# Patient Record
Sex: Female | Born: 1960 | Race: Black or African American | Hispanic: No | Marital: Single | Smoking: Never smoker
Health system: Southern US, Community
[De-identification: ages and names within clinical notes are randomized; demographics above are authoritative.]

## PROBLEM LIST (undated history)

## (undated) DIAGNOSIS — R51 Headache: Secondary | ICD-10-CM

## (undated) DIAGNOSIS — R519 Headache, unspecified: Secondary | ICD-10-CM

## (undated) DIAGNOSIS — I10 Essential (primary) hypertension: Secondary | ICD-10-CM

## (undated) DIAGNOSIS — M2550 Pain in unspecified joint: Secondary | ICD-10-CM

## (undated) DIAGNOSIS — G4730 Sleep apnea, unspecified: Secondary | ICD-10-CM

## (undated) DIAGNOSIS — D219 Benign neoplasm of connective and other soft tissue, unspecified: Secondary | ICD-10-CM

## (undated) DIAGNOSIS — F419 Anxiety disorder, unspecified: Secondary | ICD-10-CM

## (undated) DIAGNOSIS — E0789 Other specified disorders of thyroid: Secondary | ICD-10-CM

## (undated) DIAGNOSIS — M069 Rheumatoid arthritis, unspecified: Secondary | ICD-10-CM

## (undated) DIAGNOSIS — G8929 Other chronic pain: Secondary | ICD-10-CM

## (undated) DIAGNOSIS — R12 Heartburn: Secondary | ICD-10-CM

## (undated) DIAGNOSIS — M549 Dorsalgia, unspecified: Secondary | ICD-10-CM

## (undated) DIAGNOSIS — R2243 Localized swelling, mass and lump, lower limb, bilateral: Secondary | ICD-10-CM

## (undated) DIAGNOSIS — IMO0002 Reserved for concepts with insufficient information to code with codable children: Secondary | ICD-10-CM

## (undated) DIAGNOSIS — M5136 Other intervertebral disc degeneration, lumbar region: Secondary | ICD-10-CM

## (undated) DIAGNOSIS — R0602 Shortness of breath: Secondary | ICD-10-CM

## (undated) DIAGNOSIS — M5030 Other cervical disc degeneration, unspecified cervical region: Secondary | ICD-10-CM

## (undated) DIAGNOSIS — M1990 Unspecified osteoarthritis, unspecified site: Secondary | ICD-10-CM

## (undated) DIAGNOSIS — J45909 Unspecified asthma, uncomplicated: Secondary | ICD-10-CM

## (undated) DIAGNOSIS — K219 Gastro-esophageal reflux disease without esophagitis: Secondary | ICD-10-CM

## (undated) DIAGNOSIS — R7303 Prediabetes: Secondary | ICD-10-CM

## (undated) DIAGNOSIS — M51369 Other intervertebral disc degeneration, lumbar region without mention of lumbar back pain or lower extremity pain: Secondary | ICD-10-CM

## (undated) DIAGNOSIS — M797 Fibromyalgia: Secondary | ICD-10-CM

## (undated) HISTORY — DX: Shortness of breath: R06.02

## (undated) HISTORY — PX: TUBAL LIGATION: SHX77

## (undated) HISTORY — DX: Dorsalgia, unspecified: M54.9

## (undated) HISTORY — DX: Unspecified osteoarthritis, unspecified site: M19.90

## (undated) HISTORY — DX: Heartburn: R12

## (undated) HISTORY — DX: Benign neoplasm of connective and other soft tissue, unspecified: D21.9

## (undated) HISTORY — DX: Reserved for concepts with insufficient information to code with codable children: IMO0002

## (undated) HISTORY — PX: NOVASURE ABLATION: SHX5394

## (undated) HISTORY — DX: Localized swelling, mass and lump, lower limb, bilateral: R22.43

## (undated) HISTORY — DX: Rheumatoid arthritis, unspecified: M06.9

## (undated) HISTORY — DX: Other specified disorders of thyroid: E07.89

## (undated) HISTORY — DX: Other chronic pain: G89.29

## (undated) HISTORY — PX: BACK SURGERY: SHX140

## (undated) HISTORY — DX: Pain in unspecified joint: M25.50

---

## 1996-12-16 HISTORY — PX: ANTERIOR CERVICAL DECOMP/DISCECTOMY FUSION: SHX1161

## 2004-08-14 ENCOUNTER — Other Ambulatory Visit: Admission: RE | Admit: 2004-08-14 | Discharge: 2004-08-14 | Payer: Self-pay | Admitting: Family Medicine

## 2004-08-16 ENCOUNTER — Ambulatory Visit: Payer: Self-pay | Admitting: Nurse Practitioner

## 2004-10-10 ENCOUNTER — Ambulatory Visit: Payer: Self-pay | Admitting: Nurse Practitioner

## 2004-10-11 ENCOUNTER — Ambulatory Visit (HOSPITAL_COMMUNITY): Admission: RE | Admit: 2004-10-11 | Discharge: 2004-10-11 | Payer: Self-pay | Admitting: Internal Medicine

## 2004-10-16 ENCOUNTER — Ambulatory Visit: Payer: Self-pay | Admitting: Nurse Practitioner

## 2004-10-26 ENCOUNTER — Ambulatory Visit (HOSPITAL_COMMUNITY): Admission: RE | Admit: 2004-10-26 | Discharge: 2004-10-26 | Payer: Self-pay | Admitting: Family Medicine

## 2004-11-02 ENCOUNTER — Ambulatory Visit: Payer: Self-pay | Admitting: Internal Medicine

## 2004-12-03 ENCOUNTER — Ambulatory Visit: Payer: Self-pay | Admitting: Nurse Practitioner

## 2004-12-21 ENCOUNTER — Ambulatory Visit: Payer: Self-pay | Admitting: Nurse Practitioner

## 2004-12-21 ENCOUNTER — Ambulatory Visit: Payer: Self-pay | Admitting: Family Medicine

## 2005-01-14 ENCOUNTER — Ambulatory Visit (HOSPITAL_COMMUNITY): Admission: RE | Admit: 2005-01-14 | Discharge: 2005-01-14 | Payer: Self-pay | Admitting: Family Medicine

## 2005-01-14 ENCOUNTER — Ambulatory Visit: Payer: Self-pay | Admitting: Family Medicine

## 2005-01-14 ENCOUNTER — Encounter (INDEPENDENT_AMBULATORY_CARE_PROVIDER_SITE_OTHER): Payer: Self-pay | Admitting: *Deleted

## 2005-01-31 ENCOUNTER — Ambulatory Visit: Payer: Self-pay | Admitting: Family Medicine

## 2005-03-21 ENCOUNTER — Ambulatory Visit: Payer: Self-pay | Admitting: Family Medicine

## 2005-03-22 ENCOUNTER — Ambulatory Visit (HOSPITAL_COMMUNITY): Admission: RE | Admit: 2005-03-22 | Discharge: 2005-03-22 | Payer: Self-pay | Admitting: *Deleted

## 2005-05-02 ENCOUNTER — Ambulatory Visit: Payer: Self-pay | Admitting: Obstetrics and Gynecology

## 2005-05-20 ENCOUNTER — Ambulatory Visit (HOSPITAL_COMMUNITY): Admission: RE | Admit: 2005-05-20 | Discharge: 2005-05-20 | Payer: Self-pay | Admitting: Obstetrics and Gynecology

## 2005-05-20 ENCOUNTER — Ambulatory Visit: Payer: Self-pay | Admitting: Obstetrics and Gynecology

## 2005-05-20 ENCOUNTER — Encounter (INDEPENDENT_AMBULATORY_CARE_PROVIDER_SITE_OTHER): Payer: Self-pay | Admitting: *Deleted

## 2005-06-20 ENCOUNTER — Ambulatory Visit: Payer: Self-pay | Admitting: Nurse Practitioner

## 2005-07-30 ENCOUNTER — Ambulatory Visit: Payer: Self-pay | Admitting: Obstetrics and Gynecology

## 2005-09-24 ENCOUNTER — Encounter (INDEPENDENT_AMBULATORY_CARE_PROVIDER_SITE_OTHER): Payer: Self-pay | Admitting: *Deleted

## 2005-09-24 ENCOUNTER — Ambulatory Visit: Payer: Self-pay | Admitting: Obstetrics & Gynecology

## 2005-12-18 ENCOUNTER — Ambulatory Visit (HOSPITAL_COMMUNITY): Admission: RE | Admit: 2005-12-18 | Discharge: 2005-12-18 | Payer: Self-pay | Admitting: *Deleted

## 2006-06-17 ENCOUNTER — Ambulatory Visit: Payer: Self-pay | Admitting: Nurse Practitioner

## 2006-06-19 ENCOUNTER — Ambulatory Visit: Payer: Self-pay | Admitting: Nurse Practitioner

## 2006-11-24 ENCOUNTER — Ambulatory Visit: Payer: Self-pay | Admitting: Nurse Practitioner

## 2007-01-30 ENCOUNTER — Emergency Department (HOSPITAL_COMMUNITY): Admission: EM | Admit: 2007-01-30 | Discharge: 2007-01-30 | Payer: Self-pay | Admitting: Emergency Medicine

## 2007-03-05 ENCOUNTER — Ambulatory Visit: Payer: Self-pay | Admitting: Family Medicine

## 2007-04-03 ENCOUNTER — Ambulatory Visit: Payer: Self-pay | Admitting: Family Medicine

## 2007-05-02 ENCOUNTER — Emergency Department (HOSPITAL_COMMUNITY): Admission: EM | Admit: 2007-05-02 | Discharge: 2007-05-02 | Payer: Self-pay | Admitting: Family Medicine

## 2008-01-04 ENCOUNTER — Ambulatory Visit: Payer: Self-pay | Admitting: Family Medicine

## 2008-01-19 ENCOUNTER — Encounter (INDEPENDENT_AMBULATORY_CARE_PROVIDER_SITE_OTHER): Payer: Self-pay | Admitting: Internal Medicine

## 2008-01-19 ENCOUNTER — Ambulatory Visit: Payer: Self-pay | Admitting: Family Medicine

## 2008-01-19 LAB — CONVERTED CEMR LAB
ALT: 12 units/L (ref 0–35)
AST: 12 units/L (ref 0–37)
Albumin: 4 g/dL (ref 3.5–5.2)
Alkaline Phosphatase: 50 units/L (ref 39–117)
BUN: 14 mg/dL (ref 6–23)
Basophils Absolute: 0 10*3/uL (ref 0.0–0.1)
Basophils Relative: 1 % (ref 0–1)
CO2: 23 meq/L (ref 19–32)
Calcium: 9.1 mg/dL (ref 8.4–10.5)
Chloride: 108 meq/L (ref 96–112)
Cholesterol: 172 mg/dL (ref 0–200)
Creatinine, Ser: 0.84 mg/dL (ref 0.40–1.20)
Eosinophils Absolute: 0.1 10*3/uL (ref 0.0–0.7)
Eosinophils Relative: 1 % (ref 0–5)
Glucose, Bld: 93 mg/dL (ref 70–99)
HCT: 40.4 % (ref 36.0–46.0)
HDL: 57 mg/dL (ref >39)
Hemoglobin: 13.7 g/dL (ref 12.0–15.0)
LDL Cholesterol: 99 mg/dL (ref 0–99)
Lymphocytes Relative: 38 % (ref 12–46)
Lymphs Abs: 1.7 10*3/uL (ref 0.7–4.0)
MCHC: 33.9 g/dL (ref 30.0–36.0)
MCV: 92 fL (ref 78.0–100.0)
Monocytes Absolute: 0.3 10*3/uL (ref 0.1–1.0)
Monocytes Relative: 6 % (ref 3–12)
Neutro Abs: 2.4 10*3/uL (ref 1.7–7.7)
Neutrophils Relative %: 54 % (ref 43–77)
Platelets: 336 10*3/uL (ref 150–400)
Potassium: 4.4 meq/L (ref 3.5–5.3)
RBC: 4.39 M/uL (ref 3.87–5.11)
RDW: 12.9 % (ref 11.5–15.5)
Sodium: 141 meq/L (ref 135–145)
TSH: 0.755 microintl units/mL (ref 0.350–5.50)
Total Bilirubin: 0.5 mg/dL (ref 0.3–1.2)
Total CHOL/HDL Ratio: 3
Total Protein: 6.8 g/dL (ref 6.0–8.3)
Triglycerides: 80 mg/dL (ref <150)
VLDL: 16 mg/dL (ref 0–40)
WBC: 4.4 10*3/uL (ref 4.0–10.5)

## 2008-01-26 ENCOUNTER — Ambulatory Visit: Payer: Self-pay | Admitting: Family Medicine

## 2008-03-09 ENCOUNTER — Encounter (INDEPENDENT_AMBULATORY_CARE_PROVIDER_SITE_OTHER): Payer: Self-pay | Admitting: Nurse Practitioner

## 2008-03-09 ENCOUNTER — Ambulatory Visit: Payer: Self-pay | Admitting: Family Medicine

## 2008-03-09 LAB — CONVERTED CEMR LAB
ALT: 14 units/L (ref 0–35)
AST: 13 units/L (ref 0–37)
Albumin: 4.1 g/dL (ref 3.5–5.2)
Alkaline Phosphatase: 49 units/L (ref 39–117)
BUN: 11 mg/dL (ref 6–23)
CO2: 25 meq/L (ref 19–32)
Calcium: 9.1 mg/dL (ref 8.4–10.5)
Chloride: 103 meq/L (ref 96–112)
Creatinine, Ser: 0.8 mg/dL (ref 0.40–1.20)
Glucose, Bld: 91 mg/dL (ref 70–99)
Potassium: 4.3 meq/L (ref 3.5–5.3)
Sodium: 139 meq/L (ref 135–145)
Total Bilirubin: 0.4 mg/dL (ref 0.3–1.2)
Total Protein: 6.9 g/dL (ref 6.0–8.3)

## 2008-09-06 ENCOUNTER — Ambulatory Visit: Payer: Self-pay | Admitting: Internal Medicine

## 2008-10-27 ENCOUNTER — Ambulatory Visit: Payer: Self-pay | Admitting: Cardiovascular Disease

## 2008-10-27 ENCOUNTER — Observation Stay (HOSPITAL_COMMUNITY): Admission: EM | Admit: 2008-10-27 | Discharge: 2008-10-29 | Payer: Self-pay | Admitting: Family Medicine

## 2008-10-28 ENCOUNTER — Encounter (INDEPENDENT_AMBULATORY_CARE_PROVIDER_SITE_OTHER): Payer: Self-pay | Admitting: Internal Medicine

## 2009-01-27 ENCOUNTER — Ambulatory Visit: Payer: Self-pay | Admitting: Internal Medicine

## 2009-02-24 ENCOUNTER — Encounter (INDEPENDENT_AMBULATORY_CARE_PROVIDER_SITE_OTHER): Payer: Self-pay | Admitting: Internal Medicine

## 2009-02-24 ENCOUNTER — Ambulatory Visit: Payer: Self-pay | Admitting: Internal Medicine

## 2009-02-24 LAB — CONVERTED CEMR LAB
Chlamydia, DNA Probe: NEGATIVE
GC Probe Amp, Genital: NEGATIVE

## 2009-03-07 ENCOUNTER — Ambulatory Visit (HOSPITAL_COMMUNITY): Admission: RE | Admit: 2009-03-07 | Discharge: 2009-03-07 | Payer: Self-pay | Admitting: Internal Medicine

## 2009-03-07 ENCOUNTER — Encounter: Payer: Self-pay | Admitting: Internal Medicine

## 2009-03-24 ENCOUNTER — Emergency Department (HOSPITAL_COMMUNITY): Admission: EM | Admit: 2009-03-24 | Discharge: 2009-03-24 | Payer: Self-pay | Admitting: Emergency Medicine

## 2009-04-18 ENCOUNTER — Ambulatory Visit: Payer: Self-pay | Admitting: Family Medicine

## 2009-04-19 ENCOUNTER — Ambulatory Visit (HOSPITAL_COMMUNITY): Admission: RE | Admit: 2009-04-19 | Discharge: 2009-04-19 | Payer: Self-pay | Admitting: Internal Medicine

## 2009-07-16 ENCOUNTER — Emergency Department (HOSPITAL_COMMUNITY): Admission: EM | Admit: 2009-07-16 | Discharge: 2009-07-16 | Payer: Self-pay | Admitting: Family Medicine

## 2009-07-17 ENCOUNTER — Ambulatory Visit: Payer: Self-pay | Admitting: Internal Medicine

## 2009-09-14 ENCOUNTER — Telehealth (INDEPENDENT_AMBULATORY_CARE_PROVIDER_SITE_OTHER): Payer: Self-pay | Admitting: *Deleted

## 2009-11-23 ENCOUNTER — Ambulatory Visit: Payer: Self-pay | Admitting: Family Medicine

## 2009-12-14 ENCOUNTER — Telehealth (INDEPENDENT_AMBULATORY_CARE_PROVIDER_SITE_OTHER): Payer: Self-pay | Admitting: Internal Medicine

## 2009-12-18 ENCOUNTER — Ambulatory Visit: Payer: Self-pay | Admitting: Internal Medicine

## 2009-12-18 LAB — CONVERTED CEMR LAB
Amphetamine Screen, Ur: NEGATIVE
Barbiturate Quant, Ur: NEGATIVE
Benzodiazepines.: NEGATIVE
Cocaine Metabolites: NEGATIVE
Creatinine,U: 259.7 mg/dL
Marijuana Metabolite: NEGATIVE
Methadone: NEGATIVE
Opiate Screen, Urine: NEGATIVE
Phencyclidine (PCP): NEGATIVE
Propoxyphene: NEGATIVE

## 2010-01-25 ENCOUNTER — Ambulatory Visit: Payer: Self-pay | Admitting: Internal Medicine

## 2010-04-13 ENCOUNTER — Ambulatory Visit: Payer: Self-pay | Admitting: Internal Medicine

## 2010-04-13 LAB — CONVERTED CEMR LAB
BUN: 17 mg/dL (ref 6–23)
Basophils Absolute: 0 10*3/uL (ref 0.0–0.1)
Basophils Relative: 0 % (ref 0–1)
CO2: 23 meq/L (ref 19–32)
CRP: 0.9 mg/dL — ABNORMAL HIGH (ref <0.6)
Calcium: 8.8 mg/dL (ref 8.4–10.5)
Chloride: 103 meq/L (ref 96–112)
Creatinine, Ser: 0.8 mg/dL (ref 0.40–1.20)
Eosinophils Absolute: 0 10*3/uL (ref 0.0–0.7)
Eosinophils Relative: 1 % (ref 0–5)
Glucose, Bld: 88 mg/dL (ref 70–99)
HCT: 39.6 % (ref 36.0–46.0)
Hemoglobin: 13.1 g/dL (ref 12.0–15.0)
Iron: 93 ug/dL (ref 42–145)
Lymphocytes Relative: 30 % (ref 12–46)
Lymphs Abs: 1.7 10*3/uL (ref 0.7–4.0)
MCHC: 33.1 g/dL (ref 30.0–36.0)
MCV: 94.7 fL (ref 78.0–100.0)
Monocytes Absolute: 0.4 10*3/uL (ref 0.1–1.0)
Monocytes Relative: 6 % (ref 3–12)
Neutro Abs: 3.5 10*3/uL (ref 1.7–7.7)
Neutrophils Relative %: 63 % (ref 43–77)
Platelets: 337 10*3/uL (ref 150–400)
Potassium: 3.9 meq/L (ref 3.5–5.3)
RBC: 4.18 M/uL (ref 3.87–5.11)
RDW: 13.7 % (ref 11.5–15.5)
Rheumatoid fact SerPl-aCnc: <20 intl units/mL (ref 0–20)
Saturation Ratios: 26 % (ref 20–55)
Sodium: 136 meq/L (ref 135–145)
TIBC: 356 ug/dL (ref 250–470)
UIBC: 263 ug/dL
WBC: 5.6 10*3/uL (ref 4.0–10.5)

## 2010-04-20 ENCOUNTER — Ambulatory Visit: Payer: Self-pay | Admitting: Internal Medicine

## 2010-05-09 ENCOUNTER — Ambulatory Visit: Payer: Self-pay | Admitting: Internal Medicine

## 2010-05-09 LAB — CONVERTED CEMR LAB
Cholesterol: 181 mg/dL (ref 0–200)
HDL: 58 mg/dL (ref >39)
LDL Cholesterol: 92 mg/dL (ref 0–99)
TSH: 0.703 microintl units/mL (ref 0.350–4.500)
Total CHOL/HDL Ratio: 3.1
Triglycerides: 155 mg/dL — ABNORMAL HIGH (ref <150)
VLDL: 31 mg/dL (ref 0–40)

## 2010-05-17 ENCOUNTER — Ambulatory Visit: Payer: Self-pay | Admitting: Internal Medicine

## 2010-08-01 ENCOUNTER — Ambulatory Visit: Payer: Self-pay | Admitting: Internal Medicine

## 2010-08-21 ENCOUNTER — Ambulatory Visit: Payer: Self-pay | Admitting: Family Medicine

## 2010-12-12 ENCOUNTER — Ambulatory Visit (HOSPITAL_COMMUNITY): Admission: RE | Admit: 2010-12-12 | Payer: Self-pay | Source: Home / Self Care | Admitting: Internal Medicine

## 2010-12-20 ENCOUNTER — Emergency Department (HOSPITAL_COMMUNITY)
Admission: EM | Admit: 2010-12-20 | Discharge: 2010-12-20 | Payer: Self-pay | Source: Home / Self Care | Admitting: Emergency Medicine

## 2010-12-20 LAB — POCT I-STAT, CHEM 8
BUN: 21 mg/dL (ref 6–23)
Calcium, Ion: 1.13 mmol/L (ref 1.12–1.32)
Chloride: 106 mEq/L (ref 96–112)
Creatinine, Ser: 1 mg/dL (ref 0.4–1.2)
Glucose, Bld: 107 mg/dL — ABNORMAL HIGH (ref 70–99)
HCT: 40 % (ref 36.0–46.0)
Hemoglobin: 13.6 g/dL (ref 12.0–15.0)
Potassium: 3.8 mEq/L (ref 3.5–5.1)
Sodium: 138 mEq/L (ref 135–145)
TCO2: 25 mmol/L (ref 0–100)

## 2010-12-20 LAB — D-DIMER, QUANTITATIVE: D-Dimer, Quant: 0.32 ug/mL-FEU (ref 0.00–0.48)

## 2011-01-05 ENCOUNTER — Encounter: Payer: Self-pay | Admitting: *Deleted

## 2011-03-09 ENCOUNTER — Inpatient Hospital Stay (INDEPENDENT_AMBULATORY_CARE_PROVIDER_SITE_OTHER)
Admission: RE | Admit: 2011-03-09 | Discharge: 2011-03-09 | Disposition: A | Discharge status: Home / Self Care | Payer: Medicare Other | Source: Ambulatory Visit | Attending: Emergency Medicine | Admitting: Emergency Medicine

## 2011-03-09 ENCOUNTER — Emergency Department (HOSPITAL_COMMUNITY)
Admission: EM | Admit: 2011-03-09 | Discharge: 2011-03-09 | Disposition: A | Discharge status: Home / Self Care | Payer: Medicare Other | Attending: Emergency Medicine | Admitting: Emergency Medicine

## 2011-03-09 ENCOUNTER — Emergency Department (HOSPITAL_COMMUNITY): Payer: Medicare Other

## 2011-03-09 DIAGNOSIS — M1710 Unilateral primary osteoarthritis, unspecified knee: Secondary | ICD-10-CM | POA: Insufficient documentation

## 2011-03-09 DIAGNOSIS — M79609 Pain in unspecified limb: Secondary | ICD-10-CM

## 2011-03-09 DIAGNOSIS — IMO0002 Reserved for concepts with insufficient information to code with codable children: Secondary | ICD-10-CM | POA: Insufficient documentation

## 2011-03-09 DIAGNOSIS — M7989 Other specified soft tissue disorders: Secondary | ICD-10-CM

## 2011-03-09 DIAGNOSIS — M25569 Pain in unspecified knee: Secondary | ICD-10-CM | POA: Insufficient documentation

## 2011-03-09 LAB — DIFFERENTIAL
Basophils Absolute: 0 10*3/uL (ref 0.0–0.1)
Basophils Relative: 0 % (ref 0–1)
Eosinophils Absolute: 0 10*3/uL (ref 0.0–0.7)
Eosinophils Relative: 1 % (ref 0–5)
Monocytes Absolute: 0.4 10*3/uL (ref 0.1–1.0)

## 2011-03-09 LAB — APTT: aPTT: 30 seconds (ref 24–37)

## 2011-03-09 LAB — CBC
HCT: 38.8 % (ref 36.0–46.0)
MCHC: 35.3 g/dL (ref 30.0–36.0)
Platelets: 362 10*3/uL (ref 150–400)
RDW: 13.4 % (ref 11.5–15.5)
WBC: 6.4 10*3/uL (ref 4.0–10.5)

## 2011-03-09 LAB — BASIC METABOLIC PANEL
Calcium: 9.4 mg/dL (ref 8.4–10.5)
GFR calc Af Amer: >60 mL/min (ref >60)
GFR calc non Af Amer: >60 mL/min (ref >60)
Glucose, Bld: 88 mg/dL (ref 70–99)
Potassium: 3.4 mEq/L — ABNORMAL LOW (ref 3.5–5.1)
Sodium: 138 mEq/L (ref 135–145)

## 2011-03-09 LAB — PROTIME-INR: Prothrombin Time: 13.7 seconds (ref 11.6–15.2)

## 2011-03-12 ENCOUNTER — Other Ambulatory Visit: Payer: Self-pay | Admitting: Internal Medicine

## 2011-03-12 DIAGNOSIS — Z1231 Encounter for screening mammogram for malignant neoplasm of breast: Secondary | ICD-10-CM

## 2011-03-27 ENCOUNTER — Ambulatory Visit: Payer: Medicare Other

## 2011-04-24 ENCOUNTER — Other Ambulatory Visit (HOSPITAL_COMMUNITY): Payer: Self-pay | Admitting: Internal Medicine

## 2011-04-24 DIAGNOSIS — Z1231 Encounter for screening mammogram for malignant neoplasm of breast: Secondary | ICD-10-CM

## 2011-04-30 NOTE — H&P (Signed)
NAMESHERYN, ALDAZ NO.:  [id number]   MEDICAL RECORD NO.:  [id number]          PATIENT TYPE:  EMS   LOCATION:  [facility]                         FACILITY:  [facility]   PHYSICIAN:  Eduard Clos, MDDATE OF BIRTH:  [birth year]   DATE OF ADMISSION:  10/27/2008  DATE OF DISCHARGE:                              HISTORY & PHYSICAL   PRIMARY CARE PHYSICIAN:  [facility].   CHIEF COMPLAINT:  Chest pain.   HISTORY OF PRESENT ILLNESS:  A 50 year old female with a history of  hypertension presented to the ER complaining of chest pain.  The patient  has been having on and off neck pain for the last few days.  Today, she  developed some chest pain which was retrosternal radiating to her left  arm.  It lasted around 1/2-1 hour.  She was traveling, she drove the car  all the way to the ER.  Presently, the patient is chest pain free.  She  denies any associated shortness of breath, palpitations, diaphoresis,  nausea, vomiting, dizziness, loss of consciousness, dysuria, discharge  or abdominal pain.   PAST MEDICAL HISTORY:  Hypertension.   PAST SURGICAL HISTORY:  Neck surgery for disk collapse.   MEDICATIONS PRIOR TO ADMISSION:  Lisinopril/hydrochlorothiazide 20/12.5  mg p.o. daily.   ALLERGIES:  NO KNOWN DRUG ALLERGIES.   FAMILY HISTORY:  Nothing contributory.   SOCIAL HISTORY:  The patient lives with her son.  Denies smoking  cigarettes.  Drinks alcohol occasionally.  Denies any drug abuse.   REVIEW OF SYSTEMS:  As per history of present illness, nothing else  significant.   PHYSICAL EXAMINATION:  GENERAL:  Patient examined at bedside, not in  acute distress.  Denies any chest pain now.  VITAL SIGNS:  Blood pressure is 135/70, pulse 70 per minute, temperature  98.5, respirations 18 per minute.  O2 sat 100%.  HEENT:  Anicteric, no pallor.  CHEST:  Bilateral air entry present.  No rhonchi, no crepitation.  HEART:  S1-S2 heard.  ABDOMEN:  Soft, nontender.  Bowel  sounds heard.  No guarding, no  rigidity.  CNS:  Alert, awake, oriented to time, place and person.  Moves upper and  lower extremities 5/5.  EXTREMITIES:  Peripheral pulses felt.  No edema.   LABORATORY DATA:  EKG:  Normal sinus rhythm with nonspecific T-wave  changes in the lateral leads and inferior leads.  Chest x-ray:  Nothing  acute.  CBC - WBC 5.4, hemoglobin 13.6, hematocrit 40, platelets 331,  neutrophils 58%.  Basic metabolic panel:  Sodium 139, potassium 4,  chloride 104, glucose 98, BUN 13, creatinine 0.9, troponin-I less than  0.05.   ASSESSMENT:  1. Chest pain to rule out acute coronary syndrome.  2. History of hypertension.   PLAN:  Admit patient to telemetry.  Will cycle cardiac markers.  Will  resume her home medication.  Will get a 2-D echo and further  recommendations as the patient's condition evolves.      Eduard Clos, MD  Electronically Signed     ANK/MEDQ  D:  10/27/2008  T:  10/27/2008  Job:  [id number] 

## 2011-05-03 ENCOUNTER — Emergency Department (HOSPITAL_COMMUNITY)
Admission: EM | Admit: 2011-05-03 | Discharge: 2011-05-03 | Disposition: A | Discharge status: Home / Self Care | Payer: Medicare Other | Attending: Emergency Medicine | Admitting: Emergency Medicine

## 2011-05-03 ENCOUNTER — Emergency Department (HOSPITAL_COMMUNITY): Payer: Medicare Other

## 2011-05-03 ENCOUNTER — Inpatient Hospital Stay (INDEPENDENT_AMBULATORY_CARE_PROVIDER_SITE_OTHER)
Admission: RE | Admit: 2011-05-03 | Discharge: 2011-05-03 | Disposition: A | Discharge status: Home / Self Care | Payer: Medicare Other | Source: Ambulatory Visit | Attending: Emergency Medicine | Admitting: Emergency Medicine

## 2011-05-03 DIAGNOSIS — R0789 Other chest pain: Secondary | ICD-10-CM | POA: Insufficient documentation

## 2011-05-03 DIAGNOSIS — R079 Chest pain, unspecified: Secondary | ICD-10-CM

## 2011-05-03 DIAGNOSIS — I10 Essential (primary) hypertension: Secondary | ICD-10-CM | POA: Insufficient documentation

## 2011-05-03 DIAGNOSIS — M25519 Pain in unspecified shoulder: Secondary | ICD-10-CM | POA: Insufficient documentation

## 2011-05-03 DIAGNOSIS — I517 Cardiomegaly: Secondary | ICD-10-CM | POA: Insufficient documentation

## 2011-05-03 DIAGNOSIS — R0602 Shortness of breath: Secondary | ICD-10-CM | POA: Insufficient documentation

## 2011-05-03 LAB — POCT CARDIAC MARKERS: Troponin i, poc: <0.05 ng/mL (ref 0.00–0.09)

## 2011-05-03 NOTE — Group Therapy Note (Signed)
NAME:  [name], SCHNACKENBERG NO.:  [id number]   MEDICAL RECORD NO.:  [id number]          PATIENT TYPE:  WOC   LOCATION:  [facility]                   FACILITY:  [facility]   PHYSICIAN:  Argentina Donovan, MD        DATE OF BIRTH:  08/17/1961   DATE OF SERVICE:  05/02/2005                                    CLINIC NOTE   REASON FOR VISIT:  The patient is a 50 year old gravida 2 para 2-0-0-2 with  two vaginal deliveries who underwent D&C in January 2006 for constant  abdominal cramping and menometrorrhagia. Endometrial hyperplastic polyp was  obtained. There was simple hyperplasia without atypia. The patient continued  to have daily cramping and spotting and was placed on oral contraceptives  which controlled the bleeding to a great degree with the exception of the  spotting every day and the cramping. We have talked in detail with the  patient that she probably has adenomyosis by history and by ultrasound, and  a mildly enlarged uterus. We have told her that we may be able to control  the bleeding with an endometrial ablation but the cramping will continue,  and the amount of cramping and how it affects her lifestyle is going to make  her decision on whether she would rather have an ablation alone or undergo  hysterectomy. At the present time, she said that she would like to try the  ablation and see if once the bleeding is undergo - since it drives her crazy  spotting every day - if that works then she will probably be able to put up  with the bit of cramping that she has.   IMPRESSION:  Metrorrhagia with low abdominal cramping.      PR/MEDQ  D:  05/02/2005  T:  05/02/2005  Job:  [id number]

## 2011-05-03 NOTE — Group Therapy Note (Signed)
NAME:  [name], [name] NO.:  [id number]   MEDICAL RECORD NO.:  [id number]          PATIENT TYPE:  WOC   LOCATION:  [facility]                   FACILITY:  [facility]   PHYSICIAN:  Tinnie Gens, MD        DATE OF BIRTH:  05/23/1961   DATE OF SERVICE:  04/03/2007                                  CLINIC NOTE   CHIEF COMPLAINT:  Pelvic pain.   HISTORY OF PRESENT ILLNESS:  The patient is a 50 year old gravida 2,  para 2 has a history of abdominal pain that is sporadic in nature, is  usually relieved with pain medication.  The patient was previously seen  in this office in March of 2008 and discussed diagnostic laparoscopy  versus hysterectomy.  And the patient was calling back in to say she  wanted a hysterectomy.  The patient has previously undergone endometrial  ablation and has very light cycles.  She has had two this month;  however, they are so light that they do not really bother her.  The  patient is very unclear as to what her diagnosis is.  By ultrasound it  appears that the patient has a history of adenomyosis.  The pain as  previously reported is sporadic and comes and goes.  After a careful  explanation of what is causing her pain and when it is likely to go away  postmenopausal, as well as risks and benefits of surgery, the patient  has opted not for hysterectomy but watchful waiting.  She would like  Percocet just as needed for pain control and a script was written for 30  Percocet today.  The patient will follow up as needed probably in three  to four weeks.  She does need a Pap smear but she is on her cycle today  and we will get that done for her.           ______________________________  Tinnie Gens, MD     TP/MEDQ  D:  04/03/2007  T:  04/03/2007  Job:  [id number]

## 2011-05-03 NOTE — Group Therapy Note (Signed)
NAME:  [name], [name] NO.:  [id number]   MEDICAL RECORD NO.:  [id number]          PATIENT TYPE:  WOC   LOCATION:  [facility]                   FACILITY:  [facility]   PHYSICIAN:  Ginger Carne, MD DATE OF BIRTH:  1961-04-15   DATE OF SERVICE:  03/05/2007                                  CLINIC NOTE   This patient returns today because of continued lower abdominal  discomfort with dysmenorrhea.  She has been seen here quite a few times  with a number of did not show appointments.  She had an ultrasound in  January of 2007 which reconfirmed adenomyosis for which she has had  prior ultrasonography in April of 2006.  She has pain between her menses  and with her periods as well.  Menses about every 30-40 days apart.  They are light lasting 3-4 days.  She has no genitourinary or  gastrointestinal symptomatology consistent with urological or bowel  diseases.   SALIENT PHYSICAL FINDINGS:  External genitalia, vulva, and vagina  normal.  Cervix smooth without erosions or lesions.  Uterus small,  anteverted and flexed.  Both adnexa palpable and found to be normal.  The patient does have central obesity.  The patient weighs 268 pounds.   IMPRESSION AND PLAN:  I discussed with the patient that a diagnostic  laparoscopy would be her best option to delineate sources for her pain  including endometriosis.  It is more than likely that she has a  component of adenomyosis based on her symptoms and ultrasonography as  well.  She seems reluctant to proceed at this point.  I explained to her  that I am more than happy to order another ultrasound.  However, I do  not think it will change her management and not tell as anything that we  do not already know.  She will think about this and return on a p.r.n.  basis.           ______________________________  Ginger Carne, MD     SHB/MEDQ  D:  03/05/2007  T:  03/05/2007  Job:  [id number]

## 2011-05-03 NOTE — Group Therapy Note (Signed)
NAMEGIANINA, [name] NO.:  [id number]   MEDICAL RECORD NO.:  [id number]          PATIENT TYPE:  WOC   LOCATION:  [facility]                   FACILITY:  [facility]   PHYSICIAN:  Elsie Lincoln, MD      DATE OF BIRTH:  [birth year]   DATE OF SERVICE:  09/24/2005                                    CLINIC NOTE   REASON FOR VISIT:  The patient is a 50 year old female who presents for her  annual exam. The patient underwent D&C hysteroscopy and NovaSure ablation in  June 2006. Since then her periods are much better. They last 2-3 days and  are light. She is satisfied with her procedure. However, ever since the  procedure she has had some left lower quadrant abdominal pain. Her  ultrasound prior to this in April 2006 showed a normal ovary; however, she  could have a mild hematometra or something causing this pain, so we will  proceed with another transvaginal ultrasound. The patient states she is  otherwise fine. She is sexually active and has had a BTL in the past, so  pregnancy is not a concern. She still needs to be protecting herself from  STDs.   PAST MEDICAL HISTORY:  No change. She still has hypertension which is  borderline today at 144/91.   PAST SURGICAL HISTORY:  No change.   GYNECOLOGICAL HISTORY:  As above.   ALLERGIES:  None.   MEDICATIONS:  Lisinopril 20 mg p.o. daily.   REVIEW OF SYMPTOMS:  The patient has normal bowel movements every day, no  constipation. The pain does not increase with bowel movements or urination.  The patient states the pain is constant, is a pulling in the left lower  quadrant. It is worse at night and she cannot sleep on her left side.   PHYSICAL EXAMINATION:  VITAL SIGNS:  Temperature 99, pulse 75, blood  pressure 144/91, weight 270.2, height 5 feet 8 inches.  GENERAL:  Well-nourished, well-developed, no apparent distress.  HEENT:  Normocephalic, atraumatic.  BREASTS:  No masses, nontender, no lymphadenopathy.  ABDOMEN:   Soft, nontender, nondistended. No rebound, no guarding. No  tenderness to deep palpation in the left lower quadrant. Inguinal area:  No  lymphadenopathy.  PELVIC:  Genitalia Tanner V. Vagina pink, normal rugae. Cervix large,  nontender. Uterus:  Unable to palpated secondary to body habitus. Adnexa:  No masses, nontender to deep palpation.  RECTAL:  No masses.  EXTREMITIES:  Trace edema, nontender.   ASSESSMENT AND PLAN:  A 50 year old female for Pap smear, cultures, and  evaluation of left lower quadrant pain.   1.  Pap smear and cultures done.  2.  Transvaginal ultrasound to be ordered.  3.  Mammogram due November 2006; last mammogram was BIRADS 1 a year ago.   The patient is to come back in 6 weeks for test results and to discuss if  anything can be done for the pain. If there is nothing on ultrasound I would  suggest going back to her primary care physician to evaluate musculoskeletal  versus bowel problems.  ______________________________  Elsie Lincoln, MD     KL/MEDQ  D:  09/24/2005  T:  09/25/2005  Job:  [id number]

## 2011-05-03 NOTE — Op Note (Signed)
[name], [name]               ACCOUNT NO.:  [id number]   MEDICAL RECORD NO.:  [id number]          PATIENT TYPE:  AMB   LOCATION:  [facility]                           [facility]   PHYSICIAN:  Phil D. Okey Dupre, M.D.     DATE OF BIRTH:  1961-10-27   DATE OF PROCEDURE:  05/20/2005  DATE OF DISCHARGE:                                 OPERATIVE REPORT   PROCEDURES:  Dilatation and curettage and NovaSure endometrial ablation.   PREOPERATIVE DIAGNOSIS:  Intractable menometrorrhagia.   POSTOPERATIVE DIAGNOSIS:  Intractable menometrorrhagia.   PROCEDURE:  Under satisfactory MAC analgesia with the patient in the dorsal  lithotomy position, the perineum was prepped and draped in the usual sterile  manner.  Bimanual pelvic examination failed to be able to outline the uterus  because of the habitus of the patient.  A weighted speculum was placed in  the posterior fourchette of the vagina, the anterior lip of the cervix  grasped with a single-tooth tenaculum.  Xylocaine 1% 10 mL was injected into  each of the lateral paracervical areas at 4 and 8 o'clock for additional  analgesia.  The uterine cavity was then sounded to a depth of 10.5 cm.  The  internal cervical os measured 4.5 cm.  The cervical os was dilated to a #8  Hegar dilator.  The uterine cavity was explored with the polyp forceps,  followed by curettage with a small serrated curette.  Tissue was sent for  pathologic diagnosis.  The NovaSure instrument was placed into the uterus up  to the fundus after being set at 6 cm.  The usual motions of seating were  carried out with a width of 4.5 cm.  Power was 149 for one minute.  The  procedure was terminated by removing the instrument in the usual fashion.  The tenaculum and speculum were then removed from the vagina and the patient  was transferred to the recovery room in satisfactory condition, having  tolerated the procedure well.      PDR/MEDQ  D:  05/20/2005  T:  05/20/2005  Job:   [id number]

## 2011-05-03 NOTE — Op Note (Signed)
[name], FEILD               ACCOUNT NO.:  [id number]   MEDICAL RECORD NO.:  [id number]          PATIENT TYPE:  AMB   LOCATION:  [facility]                           [facility]   PHYSICIAN:  Tanya S. Shawnie Pons, M.D.   DATE OF BIRTH:  1961-09-17   DATE OF PROCEDURE:  01/14/2005  DATE OF DISCHARGE:                                 OPERATIVE REPORT   PREOPERATIVE DIAGNOSES:  1.  Dysfunctional uterine bleeding.  2.  Endometrial polyp.   POSTOPERATIVE DIAGNOSES:  1.  Dysfunctional uterine bleeding.  2.  Endometrial polyp.   PROCEDURE:  Dilatation and curettage, hysteroscopy.   SURGEON:  Shelbie Proctor. Shawnie Pons, M.D.   ANESTHESIA:  MAC and local.   SPECIMENS:  Endometrial curettings.   ESTIMATED BLOOD LOSS:  Minimal.   COMPLICATIONS:  None.   REASON FOR PROCEDURE:  The patient is a 50 year old gravida 2, para 2, who  had two endometrial polyps diagnosed on sonohysterogram, who has been on  Depo and has daily spotting and would like definitive treatment.   PROCEDURE:  The patient was taken to the OR, where she was placed in  lithotomy in [place] stirrups.  She was then prepped and draped in the usual  sterile fashion.  A speculum was used to visualize the cervix, which was  grasped anteriorly with a single-tooth tenaculum.  A cervical block was done  with 0.25% Marcaine.  The cervix was sequentially dilated.  The hysteroscope  was placed inside the uterine cavity.  There was a polyp anterior and to the  right, posterior and to the left, noted.  Sharp curettage was then used to  rid the endometrial cavity of these.  All instruments were then removed from  the vagina, all instrument and lap counts were correct 2.  The patient was  awakened and taken to the recovery room in stable condition.      TSP/MEDQ  D:  01/14/2005  T:  01/14/2005  Job:  [id number]

## 2011-05-06 ENCOUNTER — Ambulatory Visit (HOSPITAL_COMMUNITY): Payer: Medicare Other

## 2011-07-06 ENCOUNTER — Inpatient Hospital Stay (INDEPENDENT_AMBULATORY_CARE_PROVIDER_SITE_OTHER)
Admission: RE | Admit: 2011-07-06 | Discharge: 2011-07-06 | Disposition: A | Discharge status: Home / Self Care | Payer: Medicare Other | Source: Ambulatory Visit | Attending: Family Medicine | Admitting: Family Medicine

## 2011-07-06 DIAGNOSIS — R6889 Other general symptoms and signs: Secondary | ICD-10-CM

## 2011-09-17 LAB — DIFFERENTIAL
Basophils Absolute: 0
Eosinophils Absolute: 0
Eosinophils Relative: 0
Lymphocytes Relative: 33
Lymphs Abs: 1.8
Monocytes Absolute: 0.4

## 2011-09-17 LAB — POCT I-STAT, CHEM 8
BUN: 13
Calcium, Ion: 1.21
Creatinine, Ser: 0.9
Hemoglobin: 13.6
TCO2: 27

## 2011-09-17 LAB — CBC
HCT: 39
HCT: 39.2
Hemoglobin: 13.3
MCHC: 34.3
MCV: 93.7
MCV: 94.3
Platelets: 315
RDW: 13
WBC: 5.1

## 2011-09-17 LAB — LIPID PANEL
Total CHOL/HDL Ratio: 3
VLDL: 19

## 2011-09-17 LAB — POCT CARDIAC MARKERS
CKMB, poc: <1 — ABNORMAL LOW
Troponin i, poc: <0.05

## 2011-09-17 LAB — CARDIAC PANEL(CRET KIN+CKTOT+MB+TROPI)
Relative Index: INVALID
Relative Index: INVALID
Relative Index: INVALID
Total CK: 60
Total CK: 70
Total CK: 71
Total CK: 84
Troponin I: <0.01

## 2011-09-17 LAB — BASIC METABOLIC PANEL
CO2: 27
Calcium: 9
Chloride: 106
GFR calc Af Amer: >60
Potassium: 3.6
Sodium: 138

## 2011-09-30 ENCOUNTER — Ambulatory Visit: Payer: Medicare Other | Attending: Anesthesiology | Admitting: Physical Therapy

## 2012-01-16 ENCOUNTER — Ambulatory Visit: Payer: Medicare Other | Attending: Internal Medicine | Admitting: Physical Therapy

## 2012-01-16 DIAGNOSIS — M542 Cervicalgia: Secondary | ICD-10-CM | POA: Insufficient documentation

## 2012-01-16 DIAGNOSIS — IMO0001 Reserved for inherently not codable concepts without codable children: Secondary | ICD-10-CM | POA: Insufficient documentation

## 2012-01-21 ENCOUNTER — Ambulatory Visit: Payer: Medicare Other | Admitting: Physical Therapy

## 2012-01-23 ENCOUNTER — Ambulatory Visit: Payer: Medicare Other | Attending: Internal Medicine | Admitting: Physical Therapy

## 2012-01-23 DIAGNOSIS — M542 Cervicalgia: Secondary | ICD-10-CM | POA: Insufficient documentation

## 2012-01-23 DIAGNOSIS — IMO0001 Reserved for inherently not codable concepts without codable children: Secondary | ICD-10-CM | POA: Insufficient documentation

## 2012-01-30 ENCOUNTER — Ambulatory Visit: Payer: Medicare Other | Admitting: Physical Therapy

## 2012-02-04 ENCOUNTER — Ambulatory Visit: Payer: Medicare Other | Admitting: Physical Therapy

## 2012-02-06 ENCOUNTER — Ambulatory Visit: Payer: Medicare Other | Admitting: Physical Therapy

## 2012-02-11 ENCOUNTER — Ambulatory Visit: Payer: Medicare Other | Admitting: Physical Therapy

## 2012-02-13 ENCOUNTER — Ambulatory Visit: Payer: Medicare Other | Admitting: Physical Therapy

## 2012-03-06 ENCOUNTER — Other Ambulatory Visit: Payer: Self-pay | Admitting: Internal Medicine

## 2012-03-06 DIAGNOSIS — Z1231 Encounter for screening mammogram for malignant neoplasm of breast: Secondary | ICD-10-CM

## 2012-03-09 ENCOUNTER — Ambulatory Visit: Payer: Medicare Other

## 2012-03-16 ENCOUNTER — Ambulatory Visit: Payer: Medicare Other

## 2012-11-14 ENCOUNTER — Emergency Department (HOSPITAL_COMMUNITY)
Admission: EM | Admit: 2012-11-14 | Discharge: 2012-11-14 | Disposition: A | Discharge status: Home / Self Care | Payer: Medicare Other | Attending: Emergency Medicine | Admitting: Emergency Medicine

## 2012-11-14 ENCOUNTER — Encounter (HOSPITAL_COMMUNITY): Payer: Self-pay | Admitting: Emergency Medicine

## 2012-11-14 ENCOUNTER — Emergency Department (HOSPITAL_COMMUNITY): Payer: Medicare Other

## 2012-11-14 DIAGNOSIS — R059 Cough, unspecified: Secondary | ICD-10-CM | POA: Insufficient documentation

## 2012-11-14 DIAGNOSIS — R0789 Other chest pain: Secondary | ICD-10-CM | POA: Insufficient documentation

## 2012-11-14 DIAGNOSIS — R05 Cough: Secondary | ICD-10-CM | POA: Insufficient documentation

## 2012-11-14 DIAGNOSIS — Z79899 Other long term (current) drug therapy: Secondary | ICD-10-CM | POA: Insufficient documentation

## 2012-11-14 DIAGNOSIS — I10 Essential (primary) hypertension: Secondary | ICD-10-CM | POA: Insufficient documentation

## 2012-11-14 DIAGNOSIS — R0602 Shortness of breath: Secondary | ICD-10-CM | POA: Insufficient documentation

## 2012-11-14 HISTORY — DX: Essential (primary) hypertension: I10

## 2012-11-14 LAB — BASIC METABOLIC PANEL
BUN: 14 mg/dL (ref 6–23)
Chloride: 101 mEq/L (ref 96–112)
Creatinine, Ser: 0.76 mg/dL (ref 0.50–1.10)
GFR calc Af Amer: >90 mL/min (ref >90)
GFR calc non Af Amer: >90 mL/min (ref >90)
Potassium: 4 mEq/L (ref 3.5–5.1)

## 2012-11-14 LAB — CBC
HCT: 38.1 % (ref 36.0–46.0)
MCHC: 34.6 g/dL (ref 30.0–36.0)
Platelets: 369 10*3/uL (ref 150–400)
RDW: 13.1 % (ref 11.5–15.5)
WBC: 5.4 10*3/uL (ref 4.0–10.5)

## 2012-11-14 LAB — PRO B NATRIURETIC PEPTIDE: Pro B Natriuretic peptide (BNP): 50.3 pg/mL (ref 0–125)

## 2012-11-14 MED ORDER — SODIUM CHLORIDE 0.9 % IV SOLN: 1000.0000 mL | INTRAVENOUS | Status: DC

## 2012-11-14 MED ORDER — ASPIRIN 325 MG PO TABS
325.0000 mg | ORAL_TABLET | ORAL | Status: AC
  Administered 2012-11-14: 325 mg via ORAL
  Filled 2012-11-14: qty 1

## 2012-11-14 MED ORDER — HYDROMORPHONE HCL PF 1 MG/ML IJ SOLN: 1.0000 mg | Freq: Once | INTRAMUSCULAR | Status: DC

## 2012-11-14 MED ORDER — ASPIRIN 81 MG PO CHEW: 324.0000 mg | CHEWABLE_TABLET | Freq: Once | ORAL | Status: AC

## 2012-11-14 NOTE — ED Notes (Signed)
Pt states that around 1am this morning she started having right upper chest pain that radiates down her right arm and her back. Pt states she did have some Shob and headache but denies n/v.

## 2012-11-14 NOTE — ED Provider Notes (Signed)
History    CSN: [id number] Arrival date & time 11/14/12  1110 First MD Initiated Contact with Patient 11/14/12 1130     Chief Complaint  Patient presents with  . Chest Pain   HPI Comments: Located in right upper chest.  Patient is a 51 y.o. female presenting with chest pain. The history is provided by the patient.  Chest Pain Episode onset: It started at 1 am. Chest pain occurs constantly. The chest pain is unchanged. The severity of the pain is moderate. Quality: "just a pain" The pain radiates to the right shoulder. Exacerbated by: It increases with movement and lifting her right arm. Primary symptoms include shortness of breath and cough. Pertinent negatives for primary symptoms include no fever, no vomiting and no dizziness.  Pertinent negatives for past medical history include no CAD, no MI and no PE.  Pertinent negatives for family medical history include: no CAD in family and no PE in family.   No history of cancer.  No recent travel.  No estrogen medications.  Past Medical History  Diagnosis Date  . Hypertension     Past Surgical History  Procedure Date  . Back surgery     No family history on file.  History  Substance Use Topics  . Smoking status: Never Smoker   . Smokeless tobacco: Not on file  . Alcohol Use: Yes    OB History    Grav Para Term Preterm Abortions TAB SAB Ect Mult Living                  Review of Systems  Constitutional: Negative for fever.  Respiratory: Positive for cough and shortness of breath.   Cardiovascular: Positive for chest pain.  Gastrointestinal: Negative for vomiting.  Neurological: Negative for dizziness.    Allergies  Review of patient's allergies indicates no known allergies.  Home Medications   Current Outpatient Rx  Name  Route  Sig  Dispense  Refill  . ALBUTEROL SULFATE HFA 108 (90 BASE) MCG/ACT IN AERS   Inhalation   Inhale 2 puffs into the lungs every 6 (six) hours as needed. For shortness of breath         . ALPRAZOLAM 1 MG PO TABS   Oral   Take 1 mg by mouth daily as needed. For anxiety         . ATENOLOL 50 MG PO TABS   Oral   Take 50 mg by mouth daily.         Marland Kitchen HYDROCHLOROTHIAZIDE 25 MG PO TABS   Oral   Take 25 mg by mouth daily.         . OXYCODONE HCL 5 MG PO CAPS   Oral   Take 10 mg by mouth every 4 (four) hours as needed. For pain           BP 162/91  Pulse 79  Temp 98.4 F (36.9 C) (Oral)  Resp 18  SpO2 100%  Physical Exam  Nursing note and vitals reviewed. Constitutional: She appears well-developed and well-nourished. No distress.       Obese   HENT:  Head: Normocephalic and atraumatic.  Right Ear: External ear normal.  Left Ear: External ear normal.  Eyes: Conjunctivae normal are normal. Right eye exhibits no discharge. Left eye exhibits no discharge. No scleral icterus.  Neck: Neck supple. No tracheal deviation present.  Cardiovascular: Normal rate, regular rhythm and intact distal pulses.   Pulmonary/Chest: Effort normal and breath sounds normal. No stridor. No  respiratory distress. She has no wheezes. She has no rales. She exhibits tenderness (right sided).  Abdominal: Soft. Bowel sounds are normal. She exhibits no distension. There is no tenderness. There is no rebound and no guarding.  Musculoskeletal: She exhibits no edema and no tenderness.       Rasing right arm causes the pain in her chest region, ttp right periscapular/trapezius region   Neurological: She is alert. She has normal strength. No sensory deficit. Cranial nerve deficit:  no gross defecits noted. She exhibits normal muscle tone. She displays no seizure activity. Coordination normal.  Skin: Skin is warm and dry. No rash noted.  Psychiatric: She has a normal mood and affect.    ED Course  Procedures (including critical care time)  Rate: 75  Rhythm: normal sinus rhythm  QRS Axis: normal  Intervals: normal  ST/T Wave abnormalities: normal  Conduction Disutrbances:none   Narrative Interpretation: nl  Old EKG Reviewed: no changes   Labs Reviewed  CBC  BASIC METABOLIC PANEL  PRO B NATRIURETIC PEPTIDE  D-DIMER, QUANTITATIVE  POCT I-STAT TROPONIN I   Dg Chest 2 View  11/14/2012  *RADIOLOGY REPORT*  Clinical Data: Right-sided chest pain.  CHEST - 2 VIEW  Comparison: Two-view chest x-ray 05/03/2011, 12/20/2010, 10/27/2008.  Findings: Cardiac silhouette upper normal in size, unchanged. Thoracic aorta mildly tortuous, unchanged.  Hilar and mediastinal contours otherwise unremarkable.  Lungs clear.  Bronchovascular markings normal.  Pulmonary vascularity normal.  No pneumothorax. No pleural effusions.  Visualized bony thorax intact.  No significant interval change.  IMPRESSION: Stable borderline heart size.  No acute cardiopulmonary disease.   Original Report Authenticated By: Hulan Saas, M.D.      1. Musculoskeletal chest pain       MDM  Chest pain Suspect this is muscular in nature.  Has history of DDD and is on pain management.  It is is possible there may be a radicular component.  Low suspicion for ACS and PE.       Celene Kras, MD 11/14/12 [phone]

## 2012-11-14 NOTE — ED Notes (Signed)
Pt refused PIV to be placed. Pt sts if meds needed she will take everything PO only. Pt also sts she goes to pain clinic where she gets narcotics for back pain.

## 2012-11-14 NOTE — ED Notes (Signed)
RN to obtain labs with start of IV 

## 2013-08-07 ENCOUNTER — Encounter (HOSPITAL_COMMUNITY): Payer: Self-pay | Admitting: *Deleted

## 2013-08-07 ENCOUNTER — Emergency Department (INDEPENDENT_AMBULATORY_CARE_PROVIDER_SITE_OTHER)
Admission: EM | Admit: 2013-08-07 | Discharge: 2013-08-07 | Disposition: A | Discharge status: Home / Self Care | Payer: Medicare Other | Source: Home / Self Care

## 2013-08-07 DIAGNOSIS — N390 Urinary tract infection, site not specified: Secondary | ICD-10-CM

## 2013-08-07 LAB — POCT URINALYSIS DIP (DEVICE)
Bilirubin Urine: NEGATIVE
Ketones, ur: NEGATIVE mg/dL
Protein, ur: 30 mg/dL — AB
Specific Gravity, Urine: >=1.03 (ref 1.005–1.030)
pH: 6.5 (ref 5.0–8.0)

## 2013-08-07 MED ORDER — CEPHALEXIN 500 MG PO CAPS: 500.0000 mg | ORAL_CAPSULE | Freq: Four times a day (QID) | ORAL | Status: DC

## 2013-08-07 NOTE — ED Notes (Signed)
Patient requesting something for pain.  PA made aware.  She stated she would be in to see patient shortly and would discuss pain management at that time.  Patient made aware

## 2013-08-07 NOTE — ED Notes (Signed)
Pt    Reports       l  Sided  Low  Back pain  With pain  radioating  Down the  Leg          Pt  Reports  History of  Low  Back pain but this  Seems  Different        Did  Not take  bp meds  Today

## 2013-08-07 NOTE — ED Provider Notes (Signed)
CSN: [id number]     Arrival date & time 08/07/13  4098 History     None    Chief Complaint  Patient presents with  . Back Pain   (Consider location/radiation/quality/duration/timing/severity/associated sxs/prior Treatment) Patient is a 52 y.o. female presenting with back pain. The history is provided by the patient. No language interpreter was used.  Back Pain Location:  Generalized Quality:  Aching Radiates to:  Does not radiate Pain severity:  Moderate Pain is:  Same all the time Onset quality:  Gradual Timing:  Constant Progression:  Worsening Relieved by:  Nothing Worsened by:  Nothing tried Ineffective treatments:  None tried Associated symptoms: no fever    Pt reports she thinks she may have a uti.   Pt has chronic back pain nad is in pain management.  Pt reports this pain is different than usual pain  Past Medical History  Diagnosis Date  . Hypertension    Past Surgical History  Procedure Laterality Date  . Back surgery     History reviewed. No pertinent family history. History  Substance Use Topics  . Smoking status: Never Smoker   . Smokeless tobacco: Not on file  . Alcohol Use: Yes   OB History   Grav Para Term Preterm Abortions TAB SAB Ect Mult Living                 Review of Systems  Constitutional: Negative for fever.  Musculoskeletal: Positive for back pain.  All other systems reviewed and are negative.    Allergies  Review of patient's allergies indicates no known allergies.  Home Medications   Current Outpatient Rx  Name  Route  Sig  Dispense  Refill  . albuterol (PROVENTIL HFA;VENTOLIN HFA) 108 (90 BASE) MCG/ACT inhaler   Inhalation   Inhale 2 puffs into the lungs every 6 (six) hours as needed. For shortness of breath         . ALPRAZolam (XANAX) 1 MG tablet   Oral   Take 1 mg by mouth daily as needed. For anxiety         . atenolol (TENORMIN) 50 MG tablet   Oral   Take 50 mg by mouth daily.         . hydrochlorothiazide  (HYDRODIURIL) 25 MG tablet   Oral   Take 25 mg by mouth daily.         Marland Kitchen oxycodone (OXY-IR) 5 MG capsule   Oral   Take 10 mg by mouth every 4 (four) hours as needed. For pain          BP 166/97  Pulse 94  Temp(Src) 97.9 F (36.6 C) (Oral)  Resp 18  SpO2 100% Physical Exam  Nursing note and vitals reviewed. Constitutional: She appears well-developed.  HENT:  Head: Normocephalic.  Neck: Normal range of motion.  Cardiovascular: Normal rate and normal heart sounds.   Pulmonary/Chest: Effort normal and breath sounds normal.  Abdominal: Soft. Bowel sounds are normal.  Musculoskeletal: Normal range of motion.  Neurological: She is alert.  Skin: Skin is warm.  Psychiatric: She has a normal mood and affect.    ED Course   Procedures (including critical care time)  Labs Reviewed  POCT URINALYSIS DIP (DEVICE) - Abnormal; Notable for the following:    Hgb urine dipstick TRACE (*)    Protein, ur 30 (*)    Leukocytes, UA SMALL (*)    All other components within normal limits   No results found. 1. UTI (lower urinary  tract infection)     MDM  Keflex 500 QID  Elson Areas, PA-C 08/07/13 1128  Medical screening examination/treatment/procedure(s) were performed by a resident physician or non-physician practitioner and as the supervising physician I was immediately available for consultation/collaboration.  Clementeen Graham, MD   Rodolph Bong, MD 08/07/13 2004

## 2013-08-08 LAB — URINE CULTURE

## 2013-09-09 ENCOUNTER — Encounter: Payer: Self-pay | Admitting: Obstetrics

## 2013-09-30 ENCOUNTER — Ambulatory Visit (INDEPENDENT_AMBULATORY_CARE_PROVIDER_SITE_OTHER): Payer: Medicare Other | Admitting: Obstetrics

## 2013-09-30 ENCOUNTER — Encounter: Payer: Self-pay | Admitting: Obstetrics

## 2013-09-30 VITALS — BP 134/85 | HR 68 | Temp 98.2°F | Ht 68.0 in | Wt 331.0 lb

## 2013-09-30 DIAGNOSIS — Z124 Encounter for screening for malignant neoplasm of cervix: Secondary | ICD-10-CM

## 2013-09-30 DIAGNOSIS — Z0000 Encounter for general adult medical examination without abnormal findings: Secondary | ICD-10-CM

## 2013-09-30 DIAGNOSIS — Z1239 Encounter for other screening for malignant neoplasm of breast: Secondary | ICD-10-CM

## 2013-09-30 NOTE — Progress Notes (Signed)
Subjective:     [name] is a 52 y.o. female here for a routine exam.  Current complaints: Patient is in the office to establish care and get her pap smear. Patient states she was told she had polyps in her cervix.  Personal health questionnaire reviewed: yes.   Gynecologic History No LMP recorded. Patient is not currently having periods (Reason: Perimenopausal). Contraception: tubal ligation Last Pap: years. Results were: normal Last mammogram: due- years. Results were: normal  Obstetric History OB History  No data available     The following portions of the patient's history were reviewed and updated as appropriate: allergies, current medications, past family history, past medical history, past social history, past surgical history and problem list.  Review of Systems Pertinent items are noted in HPI.    Objective:    General appearance: alert and no distress Breasts: normal appearance, no masses or tenderness Abdomen: normal findings: soft, non-tender Pelvic: cervix normal in appearance, external genitalia normal, no adnexal masses or tenderness, no cervical motion tenderness, uterus normal size, shape, and consistency and vagina normal without discharge    Assessment:    Healthy female exam.    Plan:    Education reviewed: calcium supplements and self breast exams. Follow up in: 2 years. Mammogram ordered.

## 2013-09-30 NOTE — Addendum Note (Signed)
Addended by: George Hugh on: 09/30/2013 04:10 PM   Modules accepted: Orders

## 2013-10-01 LAB — WET PREP BY MOLECULAR PROBE
Candida species: NEGATIVE
Gardnerella vaginalis: POSITIVE — AB

## 2013-10-04 LAB — PAP IG W/ RFLX HPV ASCU

## 2013-10-14 ENCOUNTER — Ambulatory Visit (HOSPITAL_COMMUNITY): Payer: Medicare Other

## 2013-10-26 ENCOUNTER — Ambulatory Visit (HOSPITAL_COMMUNITY): Payer: Medicare Other

## 2013-11-19 ENCOUNTER — Ambulatory Visit (HOSPITAL_BASED_OUTPATIENT_CLINIC_OR_DEPARTMENT_OTHER): Payer: Medicare Other

## 2013-12-12 ENCOUNTER — Encounter (HOSPITAL_COMMUNITY): Payer: Self-pay | Admitting: Emergency Medicine

## 2013-12-12 ENCOUNTER — Emergency Department (HOSPITAL_COMMUNITY)
Admission: EM | Admit: 2013-12-12 | Discharge: 2013-12-12 | Disposition: A | Discharge status: Home / Self Care | Payer: Medicare Other | Attending: Emergency Medicine | Admitting: Emergency Medicine

## 2013-12-12 ENCOUNTER — Emergency Department (HOSPITAL_COMMUNITY): Payer: Medicare Other

## 2013-12-12 DIAGNOSIS — I10 Essential (primary) hypertension: Secondary | ICD-10-CM | POA: Insufficient documentation

## 2013-12-12 DIAGNOSIS — IMO0002 Reserved for concepts with insufficient information to code with codable children: Secondary | ICD-10-CM | POA: Insufficient documentation

## 2013-12-12 DIAGNOSIS — Z792 Long term (current) use of antibiotics: Secondary | ICD-10-CM | POA: Insufficient documentation

## 2013-12-12 DIAGNOSIS — Z79899 Other long term (current) drug therapy: Secondary | ICD-10-CM | POA: Insufficient documentation

## 2013-12-12 DIAGNOSIS — M5430 Sciatica, unspecified side: Secondary | ICD-10-CM | POA: Insufficient documentation

## 2013-12-12 DIAGNOSIS — M5432 Sciatica, left side: Secondary | ICD-10-CM

## 2013-12-12 MED ORDER — OXYCODONE-ACETAMINOPHEN 5-325 MG PO TABS
2.0000 | ORAL_TABLET | Freq: Once | ORAL | Status: AC
  Administered 2013-12-12: 2 via ORAL
  Filled 2013-12-12: qty 2

## 2013-12-12 MED ORDER — PREDNISONE 10 MG PO TABS: 20.0000 mg | ORAL_TABLET | Freq: Every day | ORAL | Status: DC

## 2013-12-12 MED ORDER — CYCLOBENZAPRINE HCL 10 MG PO TABS: 10.0000 mg | ORAL_TABLET | Freq: Two times a day (BID) | ORAL | Status: DC | PRN

## 2013-12-12 MED ORDER — HYDROMORPHONE HCL PF 1 MG/ML IJ SOLN
1.0000 mg | Freq: Once | INTRAMUSCULAR | Status: AC
  Administered 2013-12-12: 1 mg via INTRAMUSCULAR
  Filled 2013-12-12: qty 1

## 2013-12-12 MED ORDER — ONDANSETRON 4 MG PO TBDP
4.0000 mg | ORAL_TABLET | Freq: Once | ORAL | Status: AC
  Administered 2013-12-12: 4 mg via ORAL
  Filled 2013-12-12: qty 1

## 2013-12-12 NOTE — ED Provider Notes (Signed)
Medical screening examination/treatment/procedure(s) were performed by non-physician practitioner and as supervising physician I was immediately available for consultation/collaboration.     Geoffery Lyons, MD 12/12/13 [id number]

## 2013-12-12 NOTE — ED Notes (Signed)
Pt reports onset of left hip pain on 12/23, denies injury to hip. Pain when bearing weight on leg.

## 2013-12-12 NOTE — ED Provider Notes (Signed)
CSN: [id number]     Arrival date & time 12/12/13  1154 History  This chart was scribed for non-physician practitioner, Marlon Pel, PA-C working with Geoffery Lyons, MD by Greggory Stallion, ED scribe. This patient was seen in room TR08C/TR08C and the patient's care was started at 2:58 PM.   Chief Complaint  Patient presents with  . Hip Pain   The history is provided by the patient. No language interpreter was used.   HPI Comments: [name] is a 52 y.o. female who presents to the Emergency Department complaining of gradual onset, constant left hip pain that started 5 days ago. She has 10 mg Oxycodone at home and is on a pain management contract. She denies injury. Bearing weight worsens the pain. The pain is to the left hip and shoots down to just behind the left knee.  She denies fevers, rash of laceration to the area. nad vss.  Past Medical History  Diagnosis Date  . Hypertension   . Degenerative disc disease    Past Surgical History  Procedure Laterality Date  . Back surgery    . Novasure ablation    . Tubal ligation     Family History  Problem Relation Age of Onset  . Cancer Mother   . Diabetes Father   . Heart disease Father   . Cancer Sister    History  Substance Use Topics  . Smoking status: Never Smoker   . Smokeless tobacco: Not on file  . Alcohol Use: Yes   OB History   Grav Para Term Preterm Abortions TAB SAB Ect Mult Living                 Review of Systems  Musculoskeletal: Positive for arthralgias.  All other systems reviewed and are negative.    Allergies  Review of patient's allergies indicates no known allergies.  Home Medications   Current Outpatient Rx  Name  Route  Sig  Dispense  Refill  . albuterol (PROVENTIL HFA;VENTOLIN HFA) 108 (90 BASE) MCG/ACT inhaler   Inhalation   Inhale 2 puffs into the lungs every 6 (six) hours as needed. For shortness of breath         . ALPRAZolam (XANAX) 1 MG tablet   Oral   Take 1 mg by mouth daily  as needed. For anxiety         . atenolol (TENORMIN) 50 MG tablet   Oral   Take 100 mg by mouth daily.          . cephALEXin (KEFLEX) 500 MG capsule   Oral   Take 1 capsule (500 mg total) by mouth 4 (four) times daily.   40 capsule   0   . clonazePAM (KLONOPIN) 1 MG tablet   Oral   Take 1 mg by mouth 2 (two) times daily as needed for anxiety.         . cyclobenzaprine (FLEXERIL) 10 MG tablet   Oral   Take 1 tablet (10 mg total) by mouth 2 (two) times daily as needed for muscle spasms.   20 tablet   0   . hydrochlorothiazide (HYDRODIURIL) 25 MG tablet   Oral   Take 25 mg by mouth daily.         Marland Kitchen oxycodone (OXY-IR) 5 MG capsule   Oral   Take 10 mg by mouth every 4 (four) hours as needed. For pain         . predniSONE (DELTASONE) 10 MG tablet  Oral   Take 2 tablets (20 mg total) by mouth daily.   21 tablet   0     Prednisone dose pack directions:   6 tabs on day ...    BP 149/85  Pulse 51  Temp(Src) 97.2 F (36.2 C) (Oral)  Resp 18  Ht 5\' 8"  (1.727 m)  Wt 312 lb (141.522 kg)  BMI 47.45 kg/m2  SpO2 99%  Physical Exam  Nursing note and vitals reviewed. Constitutional: She is oriented to person, place, and time. She appears well-developed and well-nourished. No distress.  HENT:  Head: Normocephalic and atraumatic.  Eyes: EOM are normal.  Neck: Neck supple. No tracheal deviation present.  Cardiovascular: Normal rate.   Pulmonary/Chest: Effort normal. No respiratory distress.  Musculoskeletal: Normal range of motion.       Back:   Equal strength to bilateral lower extremities. Neurosensory function adequate to both legs. Skin color is normal. Skin is warm and moist. I see no step off deformity, no bony tenderness. Pt is able to ambulate without limp. Pain is relieved when sitting in certain positions. ROM is decreased due to pain. No crepitus, laceration, effusion, swelling.  Pulses are normal   Neurological: She is alert and oriented to person,  place, and time.  Skin: Skin is warm and dry.  Psychiatric: She has a normal mood and affect. Her behavior is normal.    ED Course  Procedures (including critical care time)  DIAGNOSTIC STUDIES: Oxygen Saturation is 99% on RA, normal by my interpretation.      Labs Review Labs Reviewed - No data to display Imaging Review Dg Hip Complete Left  12/12/2013   CLINICAL DATA:  Left hip pain.  No known injury.  EXAM: LEFT HIP - COMPLETE 2+ VIEW  COMPARISON:  None.  FINDINGS: No fracture. No bone lesion. The hip joints are normally space and aligned as are the SI joints and symphysis pubis. Normal soft tissues.  IMPRESSION: Negative.   Electronically Signed   By: Amie Portland M.D.   On: 12/12/2013 14:42    EKG Interpretation   None       MDM   1. Sciatica neuralgia, left    52 y.o.[name]  with back pain. No neurological deficits and normal neuro exam. Patient can walk but states is painful. No loss of bowel or bladder control. No concern for cauda equina. No fever, night sweats, weight loss, h/o cancer, IVDU. RICE protocol and pain medicine indicated and discussed with patient.   Patient Plan 1. Medications:  muscle relaxer and usual home medications  2. Treatment: rest, drink plenty of fluids, gentle stretching as discussed, alternate ice and heat  3. Follow Up: Please followup with your primary doctor for discussion of your diagnoses and further evaluation after today's visit; if you do not have a primary care doctor use the resource guide provided to find one   Vital signs are stable at discharge. Filed Vitals:   12/12/13 1215  BP: 149/85  Pulse: 51  Temp: 97.2 F (36.2 C)  Resp: 18    Patient/guardian has voiced understanding and agreed to follow-up with the PCP or specialist.         Dorthula Matas, PA-C 12/12/13 1458

## 2014-01-03 ENCOUNTER — Encounter (HOSPITAL_BASED_OUTPATIENT_CLINIC_OR_DEPARTMENT_OTHER): Payer: Medicare Other

## 2014-04-14 ENCOUNTER — Other Ambulatory Visit: Payer: Self-pay

## 2014-04-14 DIAGNOSIS — Z1231 Encounter for screening mammogram for malignant neoplasm of breast: Secondary | ICD-10-CM

## 2014-04-14 DIAGNOSIS — Z1239 Encounter for other screening for malignant neoplasm of breast: Secondary | ICD-10-CM

## 2014-04-18 ENCOUNTER — Ambulatory Visit: Payer: Medicare Other

## 2014-04-20 ENCOUNTER — Ambulatory Visit: Payer: Medicare Other

## 2014-04-27 ENCOUNTER — Ambulatory Visit: Payer: Medicare Other

## 2014-05-18 ENCOUNTER — Ambulatory Visit: Payer: Medicare Other

## 2014-12-12 ENCOUNTER — Emergency Department (INDEPENDENT_AMBULATORY_CARE_PROVIDER_SITE_OTHER): Payer: Medicare Other

## 2014-12-12 ENCOUNTER — Emergency Department (INDEPENDENT_AMBULATORY_CARE_PROVIDER_SITE_OTHER)
Admission: EM | Admit: 2014-12-12 | Discharge: 2014-12-12 | Disposition: A | Discharge status: Home / Self Care | Payer: Medicare Other | Source: Home / Self Care | Attending: Emergency Medicine | Admitting: Emergency Medicine

## 2014-12-12 ENCOUNTER — Ambulatory Visit (HOSPITAL_COMMUNITY): Payer: Medicare Other | Attending: Emergency Medicine

## 2014-12-12 ENCOUNTER — Encounter (HOSPITAL_COMMUNITY): Payer: Self-pay | Admitting: Emergency Medicine

## 2014-12-12 DIAGNOSIS — S66912A Strain of unspecified muscle, fascia and tendon at wrist and hand level, left hand, initial encounter: Secondary | ICD-10-CM

## 2014-12-12 DIAGNOSIS — S2002XA Contusion of left breast, initial encounter: Secondary | ICD-10-CM

## 2014-12-12 DIAGNOSIS — S161XXA Strain of muscle, fascia and tendon at neck level, initial encounter: Secondary | ICD-10-CM | POA: Diagnosis not present

## 2014-12-12 DIAGNOSIS — S20212A Contusion of left front wall of thorax, initial encounter: Secondary | ICD-10-CM

## 2014-12-12 DIAGNOSIS — R0781 Pleurodynia: Secondary | ICD-10-CM | POA: Insufficient documentation

## 2014-12-12 DIAGNOSIS — S8002XA Contusion of left knee, initial encounter: Secondary | ICD-10-CM

## 2014-12-12 DIAGNOSIS — S39012A Strain of muscle, fascia and tendon of lower back, initial encounter: Secondary | ICD-10-CM

## 2014-12-12 DIAGNOSIS — IMO0001 Reserved for inherently not codable concepts without codable children: Secondary | ICD-10-CM

## 2014-12-12 MED ORDER — METRONIDAZOLE 500 MG PO TABS: 500.0000 mg | ORAL_TABLET | Freq: Two times a day (BID) | ORAL | Status: DC

## 2014-12-12 MED ORDER — FLUCONAZOLE 150 MG PO TABS: 150.0000 mg | ORAL_TABLET | Freq: Once | ORAL | Status: DC

## 2014-12-12 MED ORDER — MELOXICAM 15 MG PO TABS: 15.0000 mg | ORAL_TABLET | Freq: Every day | ORAL | Status: DC

## 2014-12-12 MED ORDER — CYCLOBENZAPRINE HCL 5 MG PO TABS: 5.0000 mg | ORAL_TABLET | Freq: Three times a day (TID) | ORAL | Status: DC | PRN

## 2014-12-12 NOTE — Discharge Instructions (Signed)
Do exercises twice daily followed by moist heat for 15 minutes.      Try to be as active as possible.  If no better in 2 weeks, follow up with orthopedist.   TREATMENT  Treatment initially involves the use of ice and medication to help reduce pain and inflammation. It is also important to perform strengthening and stretching exercises and modify activities that worsen symptoms so the injury does not get worse. These exercises may be performed at home or with a therapist. For patients who experience severe symptoms, a soft padded collar may be recommended to be worn around the neck.  Improving your posture may help reduce symptoms. Posture improvement includes pulling your chin and abdomen in while sitting or standing. If you are sitting, sit in a firm chair with your buttocks against the back of the chair. While sleeping, try replacing your pillow with a small towel rolled to 2 inches in diameter, or use a cervical pillow. Poor sleeping positions delay healing.   MEDICATION   If pain medication is necessary, nonsteroidal anti-inflammatory medications, such as aspirin and ibuprofen, or other minor pain relievers, such as acetaminophen, are often recommended.  Do not take pain medication for 7 days before surgery.  Prescription pain relievers may be given if deemed necessary by your caregiver. Use only as directed and only as much as you need.  HEAT AND COLD:   Cold treatment (icing) relieves pain and reduces inflammation. Cold treatment should be applied for 10 to 15 minutes every 2 to 3 hours for inflammation and pain and immediately after any activity that aggravates your symptoms. Use ice packs or an ice massage.  Heat treatment may be used prior to performing the stretching and strengthening activities prescribed by your caregiver, physical therapist, or athletic trainer. Use a heat pack or a warm soak.  SEEK MEDICAL CARE IF:   Symptoms get worse or do not improve in 2 weeks despite  treatment.  New, unexplained symptoms develop (drugs used in treatment may produce side effects).  EXERCISES RANGE OF MOTION (ROM) AND STRETCHING EXERCISES - Cervical Strain and Sprain These exercises may help you when beginning to rehabilitate your injury. In order to successfully resolve your symptoms, you must improve your posture. These exercises are designed to help reduce the forward-head and rounded-shoulder posture which contributes to this condition. Your symptoms may resolve with or without further involvement from your physician, physical therapist or athletic trainer. While completing these exercises, remember:   Restoring tissue flexibility helps normal motion to return to the joints. This allows healthier, less painful movement and activity.  An effective stretch should be held for at least 20 seconds, although you may need to begin with shorter hold times for comfort.  A stretch should never be painful. You should only feel a gentle lengthening or release in the stretched tissue.  STRETCH- Axial Extensors  Lie on your back on the floor. You may bend your knees for comfort. Place a rolled up hand towel or dish towel, about 2 inches in diameter, under the part of your head that makes contact with the floor.  Gently tuck your chin, as if trying to make a "double chin," until you feel a gentle stretch at the base of your head.  Hold _____10_____ seconds. Repeat _____10_____ times. Complete this exercise _____2_____ times per day.   STRETECH - Axial Extension   Stand or sit on a firm surface. Assume a good posture: chest up, shoulders drawn back, abdominal muscles slightly  tense, knees unlocked (if standing) and feet hip width apart.  Slowly retract your chin so your head slides back and your chin slightly lowers.Continue to look straight ahead.  You should feel a gentle stretch in the back of your head. Be certain not to feel an aggressive stretch since this can cause  headaches later.  Hold for ____10______ seconds. Repeat _____10_____ times. Complete this exercise ____2______ times per day.  STRETCH  Cervical Side Bend   Stand or sit on a firm surface. Assume a good posture: chest up, shoulders drawn back, abdominal muscles slightly tense, knees unlocked (if standing) and feet hip width apart.  Without letting your nose or shoulders move, slowly tip your right / left ear to your shoulder until your feel a gentle stretch in the muscles on the opposite side of your neck.  Hold _____10_____ seconds. Repeat _____10_____ times. Complete this exercise _____2_____ times per day.  STRETCH  Cervical Rotators   Stand or sit on a firm surface. Assume a good posture: chest up, shoulders drawn back, abdominal muscles slightly tense, knees unlocked (if standing) and feet hip width apart.  Keeping your eyes level with the ground, slowly turn your head until you feel a gentle stretch along the back and opposite side of your neck.  Hold _____10_____ seconds. Repeat ____10______ times. Complete this exercise ____2______ times per day.  RANGE OF MOTION - Neck Circles   Stand or sit on a firm surface. Assume a good posture: chest up, shoulders drawn back, abdominal muscles slightly tense, knees unlocked (if standing) and feet hip width apart.  Gently roll your head down and around from the back of one shoulder to the back of the other. The motion should never be forced or painful.  Repeat the motion 10-20 times, or until you feel the neck muscles relax and loosen. Repeat ____10______ times. Complete the exercise _____2_____ times per day.  STRENGTHENING EXERCISES - Cervical Strain and Sprain These exercises may help you when beginning to rehabilitate your injury. They may resolve your symptoms with or without further involvement from your physician, physical therapist or athletic trainer. While completing these exercises, remember:   Muscles can gain both the  endurance and the strength needed for everyday activities through controlled exercises.  Complete these exercises as instructed by your physician, physical therapist or athletic trainer. Progress the resistance and repetitions only as guided.  You may experience muscle soreness or fatigue, but the pain or discomfort you are trying to eliminate should never worsen during these exercises. If this pain does worsen, stop and make certain you are following the directions exactly. If the pain is still present after adjustments, discontinue the exercise until you can discuss the trouble with your clinician.  STRENGTH Cervical Flexors, Isometric  Face a wall, standing about 6 inches away. Place a small pillow, a ball about 6-8 inches in diameter, or a folded towel between your forehead and the wall.  Slightly tuck your chin and gently push your forehead into the soft object. Push only with mild to moderate intensity, building up tension gradually. Keep your jaw and forehead relaxed.  Hold 10 to 20 seconds. Keep your breathing relaxed.  Release the tension slowly. Relax your neck muscles completely before you start the next repetition. Repeat _____10_____ times. Complete this exercise _____2_____ times per day.  STRENGTH- Cervical Lateral Flexors, Isometric   Stand about 6 inches away from a wall. Place a small pillow, a ball about 6-8 inches in diameter, or a folded  towel between the side of your head and the wall.  Slightly tuck your chin and gently tilt your head into the soft object. Push only with mild to moderate intensity, building up tension gradually. Keep your jaw and forehead relaxed.  Hold 10 to 20 seconds. Keep your breathing relaxed.  Release the tension slowly. Relax your neck muscles completely before you start the next repetition. Repeat _____10_____ times. Complete this exercise ____2______ times per day.  STRENGTH  Cervical Extensors, Isometric   Stand about 6 inches away from  a wall. Place a small pillow, a ball about 6-8 inches in diameter, or a folded towel between the back of your head and the wall.  Slightly tuck your chin and gently tilt your head back into the soft object. Push only with mild to moderate intensity, building up tension gradually. Keep your jaw and forehead relaxed.  Hold 10 to 20 seconds. Keep your breathing relaxed.  Release the tension slowly. Relax your neck muscles completely before you start the next repetition. Repeat _____10_____ times. Complete this exercise _____2_____ times per day.  POSTURE AND BODY MECHANICS CONSIDERATIONS - Cervical Strain and Sprain Keeping correct posture when sitting, standing or completing your activities will reduce the stress put on different body tissues, allowing injured tissues a chance to heal and limiting painful experiences. The following are general guidelines for improved posture. Your physician or physical therapist will provide you with any instructions specific to your needs. While reading these guidelines, remember:  The exercises prescribed by your provider will help you have the flexibility and strength to maintain correct postures.  The correct posture provides the optimal environment for your joints to work. All of your joints have less wear and tear when properly supported by a spine with good posture. This means you will experience a healthier, less painful body.  Correct posture must be practiced with all of your activities, especially prolonged sitting and standing. Correct posture is as important when doing repetitive low-stress activities (typing) as it is when doing a single heavy-load activity (lifting). PROLONGED STANDING WHILE SLIGHTLY LEANING FORWARD When completing a task that requires you to lean forward while standing in one place for a long time, place either foot up on a stationary 2-4 inch high object to help maintain the best posture. When both feet are on the ground, the low  back tends to lose its slight inward curve. If this curve flattens (or becomes too large), then the back and your other joints will experience too much stress, fatigue more quickly and can cause pain.  RESTING POSITIONS Consider which positions are most painful for you when choosing a resting position. If you have pain with flexion-based activities (sitting, bending, stooping, squatting), choose a position that allows you to rest in a less flexed posture. You would want to avoid curling into a fetal position on your side. If your pain worsens with extension-based activities (prolonged standing, working overhead), avoid resting in an extended position such as sleeping on your stomach. Most people will find more comfort when they rest with their spine in a more neutral position, neither too rounded nor too arched. Lying on a non-sagging bed on your side with a pillow between your knees, or on your back with a pillow under your knees will often provide some relief. Keep in mind, being in any one position for a prolonged period of time, no matter how correct your posture, can still lead to stiffness. WALKING Walk with an upright posture. Your ears,  shoulders and hips should all line-up. OFFICE WORK When working at a desk, create an environment that supports good, upright posture. Without extra support, muscles fatigue and lead to excessive strain on joints and other tissues. CHAIR:  A chair should be able to slide under your desk when your back makes contact with the back of the chair. This allows you to work closely.  The chair's height should allow your eyes to be level with the upper part of your monitor and your hands to be slightly lower than your elbows.  Body position:  Your feet should make contact with the floor. If this is not possible, use a foot rest.  Keep your ears over your shoulders. This will reduce stress on your neck and low back. Document Released: 12/02/2005 Document Revised:  02/24/2012 Document Reviewed: 03/16/2009 [facility] Patient Information 2013 [place].
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
## 2015-03-29 ENCOUNTER — Ambulatory Visit (HOSPITAL_BASED_OUTPATIENT_CLINIC_OR_DEPARTMENT_OTHER): Payer: Medicare Other

## 2015-03-30 ENCOUNTER — Ambulatory Visit (HOSPITAL_BASED_OUTPATIENT_CLINIC_OR_DEPARTMENT_OTHER): Payer: Medicare Other | Attending: Nurse Practitioner

## 2015-03-30 VITALS — Ht 68.0 in | Wt 312.0 lb

## 2015-03-30 DIAGNOSIS — G4733 Obstructive sleep apnea (adult) (pediatric): Secondary | ICD-10-CM | POA: Insufficient documentation

## 2015-03-30 DIAGNOSIS — R0683 Snoring: Secondary | ICD-10-CM | POA: Diagnosis not present

## 2015-03-30 DIAGNOSIS — I119 Hypertensive heart disease without heart failure: Secondary | ICD-10-CM

## 2015-03-30 DIAGNOSIS — G4710 Hypersomnia, unspecified: Secondary | ICD-10-CM | POA: Diagnosis present

## 2015-04-01 ENCOUNTER — Ambulatory Visit (HOSPITAL_BASED_OUTPATIENT_CLINIC_OR_DEPARTMENT_OTHER): Payer: Medicare Other | Admitting: Internal Medicine

## 2015-04-01 DIAGNOSIS — G4733 Obstructive sleep apnea (adult) (pediatric): Secondary | ICD-10-CM | POA: Diagnosis not present

## 2015-04-01 DIAGNOSIS — I119 Hypertensive heart disease without heart failure: Secondary | ICD-10-CM | POA: Diagnosis not present

## 2015-04-01 NOTE — Sleep Study (Signed)
   NAME: [name] DATE OF BIRTH:  08-28-1961 MEDICAL RECORD NUMBER [id number]  LOCATION: [facility]  PHYSICIAN: Deloris Mittag D  DATE OF STUDY: 03/30/2015  SLEEP STUDY TYPE: Nocturnal Polysomnogram               REFERRING PHYSICIAN: Tarry Kos, NP  INDICATION FOR STUDY: Hypersomnia with sleep apnea  EPWORTH SLEEPINESS SCORE:   10/24 HEIGHT: 5\' 8"  (172.7 cm)  WEIGHT: (!) 312 lb (141.522 kg)    Body mass index is 47.45 kg/(m^2).  NECK SIZE: 16 in.  MEDICATIONS: Charted for review  SLEEP ARCHITECTURE: Total sleep time 296 minutes with sleep efficiency 64.6%. Stage I was 15%, stage II 73.6%, stage III absent, REM 11.3% of total sleep time. Sleep latency 24.5 minutes, REM latency 400 minutes, awake after sleep onset 137.5 minutes, arousal index 6.5, bedtime medication: None  RESPIRATORY DATA: Apnea hypopnea index (AHI) 12.2 per hour. 60 total events scored including 8 obstructive apneas and 52 hypopneas. All events were nonsupine. REM AHI 35.8 per hour. There were not enough early events to permit application of split protocol CPAP titration on this study  OXYGEN DATA: Moderate snoring with oxygen desaturation to a nadir of 85% and mean saturation 96.5% on room air  CARDIAC DATA: Sinus rhythm with PVCs  MOVEMENT/PARASOMNIA: No significant movement disturbance, bathroom 1  IMPRESSION/ RECOMMENDATION:   1) Mild obstructive sleep apnea/hypopnea syndrome, AHI 12.2 per hour with nonsupine events. REM AHI 35.8 per hour. Moderate snoring with oxygen desaturation to a nadir of 85% and mean saturation 96.5% on room air. 2) There were not enough early events to permit application of split protocol CPAP titration on this study. Frequency of events increased after 2 AM. This patient can return for dedicated CPAP titration study if appropriate.  Deneise Lever Diplomate, American Board of Sleep Medicine  ELECTRONICALLY SIGNED ON:  04/01/2015, 11:41 AM [place] PH: [phone]   FX: [phone] [place]

## 2015-07-11 ENCOUNTER — Ambulatory Visit (HOSPITAL_BASED_OUTPATIENT_CLINIC_OR_DEPARTMENT_OTHER): Payer: Medicare Other | Attending: Nurse Practitioner | Admitting: Radiology

## 2015-07-11 VITALS — Ht 69.0 in | Wt 320.0 lb

## 2015-07-11 DIAGNOSIS — G4730 Sleep apnea, unspecified: Secondary | ICD-10-CM | POA: Diagnosis present

## 2015-07-11 DIAGNOSIS — G4733 Obstructive sleep apnea (adult) (pediatric): Secondary | ICD-10-CM | POA: Diagnosis not present

## 2015-07-22 ENCOUNTER — Ambulatory Visit (HOSPITAL_BASED_OUTPATIENT_CLINIC_OR_DEPARTMENT_OTHER): Payer: Medicare Other | Admitting: Internal Medicine

## 2015-07-22 DIAGNOSIS — G4733 Obstructive sleep apnea (adult) (pediatric): Secondary | ICD-10-CM | POA: Diagnosis not present

## 2015-07-22 NOTE — Progress Notes (Signed)
   Patient Name: [name], [name] Date: 07/11/2015 Gender: Female D.O.B: [birth year] Age (years): 15 Referring Provider: Tarry Kos, NP Height (inches): 69 Interpreting Physician: Baird Lyons MD, ABSM Weight (lbs): 320 RPSGT: Laren Everts BMI: 47 MRN: [id number] Neck Size: 17.50 CLINICAL INFORMATION The patient is referred for a CPAP titration to treat sleep apnea.   Date of NPSG, Split Night or HST: 03/30/15   AHI 12.2/ hr, weight 312 lbs   SLEEP STUDY TECHNIQUE As per the AASM Manual for the Scoring of Sleep and Associated Events v2.3 (April 2016) with a hypopnea requiring 4% desaturations.  The channels recorded and monitored were frontal, central and occipital EEG, electrooculogram (EOG), submentalis EMG (chin), nasal and oral airflow, thoracic and abdominal wall motion, anterior tibialis EMG, snore microphone, electrocardiogram, and pulse oximetry. Continuous positive airway pressure (CPAP) was initiated at the beginning of the study and titrated to treat sleep-disordered breathing.  MEDICATIONS Medications taken by the patient : Charted for review Medications administered by patient during sleep study : No sleep medicine administered. TECHNICIAN COMMENTS Comments added by technician: NONE  Comments added by scorer: N/A  RESPIRATORY PARAMETERS Optimal PAP Pressure (cm): 10 AHI at Optimal Pressure (/hr): 0.0 Overall Minimal O2 (%): 89.00 Supine % at Optimal Pressure (%): 5 Minimal O2 at Optimal Pressure (%): 94.0    SLEEP ARCHITECTURE The study was initiated at 9:59:55 PM and ended at 5:20:17 AM.  Sleep onset time was 13.8 minutes and the sleep efficiency was 85.5%. The total sleep time was 376.5 minutes.  The patient spent 13.15% of the night in stage N1 sleep, 59.36% in stage N2 sleep, 0.00% in stage N3 and 27.49% in REM.Stage REM latency was 50.0 minutes  Wake after sleep onset was 50.1. Alpha intrusion was absent. Supine sleep was  4.24%.  CARDIAC DATA The 2 lead EKG demonstrated sinus rhythm. The mean heart rate was 49.76 beats per minute. Other EKG findings include: None.  LEG MOVEMENT DATA The total Periodic Limb Movements of Sleep (PLMS) were 0. The PLMS index was 0.00. A PLMS index of <15 is considered normal in adults.  IMPRESSIONS The optimal PAP pressure was 10 cm of water. Central sleep apnea was not noted during this titration (CAI = 0.5/h). Mild oxygen desaturations were observed during this titration (min O2 = 89.00%). No snoring was audible during this study. No cardiac abnormalities were observed during this study. Clinically significant periodic limb movements were not noted during this study. Arousals associated with PLMs were rare.  DIAGNOSIS Obstructive Sleep Apnea (327.23 [G47.33 ICD-10])  RECOMMENDATIONS Trial of CPAP therapy on 10 cm H2O with a Small size Fisher&Paykel Full Face Mask Simplus mask and heated humidification. Avoid alcohol, sedatives and other CNS depressants that may worsen sleep apnea and disrupt normal sleep architecture. Sleep hygiene should be reviewed to assess factors that may improve sleep quality. Weight management and regular exercise should be initiated or continued.  Deneise Lever Diplomate, American Board of Sleep Medicine  ELECTRONICALLY SIGNED ON:  07/22/2015, 10:42 AM [place] PH: [phone]   FX: [phone] [place]

## 2015-07-23 ENCOUNTER — Ambulatory Visit: Payer: Medicare Other | Admitting: Internal Medicine

## 2015-09-16 ENCOUNTER — Emergency Department (HOSPITAL_COMMUNITY)
Admission: EM | Admit: 2015-09-16 | Discharge: 2015-09-16 | Disposition: A | Discharge status: Home / Self Care | Payer: Medicare Other | Attending: Emergency Medicine | Admitting: Emergency Medicine

## 2015-09-16 ENCOUNTER — Encounter (HOSPITAL_COMMUNITY): Payer: Self-pay | Admitting: Emergency Medicine

## 2015-09-16 DIAGNOSIS — Z792 Long term (current) use of antibiotics: Secondary | ICD-10-CM | POA: Diagnosis not present

## 2015-09-16 DIAGNOSIS — M62838 Other muscle spasm: Secondary | ICD-10-CM

## 2015-09-16 DIAGNOSIS — M542 Cervicalgia: Secondary | ICD-10-CM | POA: Diagnosis not present

## 2015-09-16 DIAGNOSIS — Z79899 Other long term (current) drug therapy: Secondary | ICD-10-CM | POA: Insufficient documentation

## 2015-09-16 DIAGNOSIS — I10 Essential (primary) hypertension: Secondary | ICD-10-CM | POA: Insufficient documentation

## 2015-09-16 DIAGNOSIS — Z791 Long term (current) use of non-steroidal anti-inflammatories (NSAID): Secondary | ICD-10-CM | POA: Insufficient documentation

## 2015-09-16 DIAGNOSIS — Z7952 Long term (current) use of systemic steroids: Secondary | ICD-10-CM | POA: Diagnosis not present

## 2015-09-16 DIAGNOSIS — M5030 Other cervical disc degeneration, unspecified cervical region: Secondary | ICD-10-CM | POA: Insufficient documentation

## 2015-09-16 HISTORY — DX: Other cervical disc degeneration, unspecified cervical region: M50.30

## 2015-09-16 MED ORDER — OXYCODONE HCL 5 MG PO TABS
10.0000 mg | ORAL_TABLET | Freq: Once | ORAL | Status: AC
  Administered 2015-09-16: 10 mg via ORAL
  Filled 2015-09-16: qty 2

## 2015-09-16 MED ORDER — HYDROMORPHONE HCL 2 MG/ML IJ SOLN: 2.0000 mg | Freq: Once | INTRAMUSCULAR | Status: DC

## 2015-09-16 MED ORDER — ONDANSETRON 4 MG PO TBDP: 4.0000 mg | ORAL_TABLET | Freq: Once | ORAL | Status: DC

## 2015-09-16 MED ORDER — HYDROMORPHONE HCL 1 MG/ML IJ SOLN: 1.0000 mg | Freq: Once | INTRAMUSCULAR | Status: DC

## 2015-09-16 MED ORDER — BUPIVACAINE HCL (PF) 0.25 % IJ SOLN
20.0000 mL | Freq: Once | INTRAMUSCULAR | Status: AC
  Administered 2015-09-16: 20 mL
  Filled 2015-09-16: qty 30

## 2015-09-16 NOTE — ED Provider Notes (Signed)
CSN: [id number]     Arrival date & time 09/16/15  1121 History   First MD Initiated Contact with Patient 09/16/15 1127     Chief Complaint  Patient presents with  . Neck Pain     (Consider location/radiation/quality/duration/timing/severity/associated sxs/prior Treatment) HPI  [name] Is a morbidly obese female with a past medical history of chronic pain, hypertension and degenerative disc disease. Presents with exacerbation of her chronic neck pain. Patient states that about 4 days ago she began having pain in the left shoulder blade region. She states that she has pain with any movement of her neck, but it is worse when she rotates to the right. She denies any new neurologic symptoms, but does have some chronic paresthesia of the left arm. She has been taking her 10 mg of OxyIR every 4 hours without relief of her pain. Night any injury to the neck.   Past Medical History  Diagnosis Date  . Hypertension   . Degenerative disc disease   . Degenerative disc disease, cervical   . Degenerative disc disease, cervical    Past Surgical History  Procedure Laterality Date  . Back surgery    . Novasure ablation    . Tubal ligation     Family History  Problem Relation Age of Onset  . Cancer Mother   . Diabetes Father   . Heart disease Father   . Cancer Sister    Social History  Substance Use Topics  . Smoking status: Never Smoker   . Smokeless tobacco: None  . Alcohol Use: Yes   OB History    No data available     Review of Systems  Constitutional: Negative for fever and chills.  Musculoskeletal: Positive for neck pain and neck stiffness.  Neurological: Negative for headaches.      Allergies  Review of patient's allergies indicates no known allergies.  Home Medications   Prior to Admission medications   Medication Sig Start Date End Date Taking? Authorizing Provider  albuterol (PROVENTIL HFA;VENTOLIN HFA) 108 (90 BASE) MCG/ACT inhaler Inhale 2 puffs into the  lungs every 6 (six) hours as needed. For shortness of breath    Historical Provider, MD  ALPRAZolam Duanne Moron) 1 MG tablet Take 2 mg by mouth daily as needed. For anxiety    Historical Provider, MD  amLODipine (NORVASC) 5 MG tablet Take 5 mg by mouth daily.    Historical Provider, MD  atenolol (TENORMIN) 50 MG tablet Take 100 mg by mouth daily.     Historical Provider, MD  cephALEXin (KEFLEX) 500 MG capsule Take 1 capsule (500 mg total) by mouth 4 (four) times daily. 08/07/13   Fransico Meadow, PA-C  clonazePAM (KLONOPIN) 1 MG tablet Take 1 mg by mouth 2 (two) times daily as needed for anxiety.    Historical Provider, MD  cyclobenzaprine (FLEXERIL) 5 MG tablet Take 1 tablet (5 mg total) by mouth 3 (three) times daily as needed for muscle spasms. 12/12/14   Harden Mo, MD  hydrochlorothiazide (HYDRODIURIL) 25 MG tablet Take 25 mg by mouth daily.    Historical Provider, MD  losartan-hydrochlorothiazide (HYZAAR) 100-25 MG per tablet Take 1 tablet by mouth daily.    Historical Provider, MD  meloxicam (MOBIC) 15 MG tablet Take 1 tablet (15 mg total) by mouth daily. 12/12/14   Harden Mo, MD  oxycodone (OXY-IR) 5 MG capsule Take 10 mg by mouth every 4 (four) hours as needed. For pain    Historical Provider, MD  predniSONE (DELTASONE) 10 MG tablet Take 2 tablets (20 mg total) by mouth daily. 12/12/13   Tiffany Carlota Raspberry, PA-C   BP 146/93 mmHg  Pulse 74  Temp(Src) 97.2 F (36.2 C) (Oral)  Resp 12  SpO2 100%  LMP 04/20/2011 Physical Exam  Constitutional: She is oriented to person, place, and time. She appears well-developed and well-nourished. No distress.  HENT:  Head: Normocephalic and atraumatic.  Eyes: Conjunctivae are normal. No scleral icterus.  Neck: Normal range of motion.  Cardiovascular: Normal rate, regular rhythm and normal heart sounds.  Exam reveals no gallop and no friction rub.   No murmur heard. Pulmonary/Chest: Effort normal and breath sounds normal. No respiratory distress.    Large pendulous breasts  Abdominal: Soft. Bowel sounds are normal. She exhibits no distension and no mass. There is no tenderness. There is no guarding.  Musculoskeletal:       Arms: Patient with limited range of motion of the neck. Pain is worse in the left shoulder with right lateral rotation and left lateral flexion.  Neurological: She is alert and oriented to person, place, and time.  Skin: Skin is warm and dry. She is not diaphoretic.  Nursing note and vitals reviewed.   ED Course  Procedures (including critical care time) Labs Review Labs Reviewed - No data to display  Imaging Review No results found. I have personally reviewed and evaluated these images and lab results as part of my medical decision-making.   EKG Interpretation None     Trigger Point Injection Date/Time:09/16/2015 1:20 PM Performed by: Margarita Mail Authorized by: Margarita Mail Consent: Verbal consent obtained. Risks and benefits: risks, benefits and alternatives were discussed Consent given by: patient Patient identity confirmed: provided patient demograhics Preparation: Patient was prepped and draped in the usual sterile fashion. Local anesthesia used: yes Anesthesia: local infiltration Local anesthetic: Bupivicaine 0.25 w/o  Anesthetic total: 5 Patient tolerance: Patient did not tolerate the procedure well and asked to stop the procedure.   MDM   Final diagnoses:  Neck pain  Muscle spasms of neck  Muscle spasm of left shoulder area    Agent with acute on chronic neck pain. She does have acute spasm. Patient asked to discontinue trigger point injections. She states she did feel some relief, however, did not tolerate the medication well, stating it hurt too much. Patient given her normal home medications. She'll be discharged. Follow up with her pain management clinic.    Margarita Mail, PA-C 09/16/15 [place], MD 09/16/15 1320

## 2015-09-16 NOTE — Discharge Instructions (Signed)
Chronic Pain Discharge Instructions  Emergency care providers appreciate that many patients coming to [name] are in severe pain and we wish to address their pain in the safest, most responsible manner.  It is important to recognize however, that the proper treatment of chronic pain differs from that of the pain of injuries and acute illnesses.  Our goal is to provide quality, safe, personalized care and we thank you for giving [name] the opportunity to serve you. The use of narcotics and related agents for chronic pain syndromes may lead to additional physical and psychological problems.  Nearly as many people die from prescription narcotics each year as die from car crashes.  Additionally, this risk is increased if such prescriptions are obtained from a variety of sources.  Therefore, only your primary care physician or a pain management specialist is able to safely treat such syndromes with narcotic medications long-term.    Documentation revealing such prescriptions have been sought from multiple sources may prohibit [name] from providing a refill or different narcotic medication.  Your name may be checked first through the [facility].  This database is a record of controlled substance medication prescriptions that the patient has received.  This has been established by [facility] in an effort to eliminate the dangerous, and often life threatening, practice of obtaining multiple prescriptions from different medical providers.   If you have a chronic pain syndrome (i.e. chronic headaches, recurrent back or neck pain, dental pain, abdominal or pelvis pain without a specific diagnosis, or neuropathic pain such as fibromyalgia) or recurrent visits for the same condition without an acute diagnosis, you may be treated with non-narcotics and other non-addictive medicines.  Allergic reactions or negative side effects that may be reported by a patient to such medications will not  typically lead to the use of a narcotic analgesic or other controlled substance as an alternative.   Patients managing chronic pain with a personal physician should have provisions in place for breakthrough pain.  If you are in crisis, you should call your physician.  If your physician directs you to the emergency department, please have the doctor call and speak to our attending physician concerning your care.   When patients come to the Emergency Department (ED) with acute medical conditions in which the Emergency Department physician feels appropriate to prescribe narcotic or sedating pain medication, the physician will prescribe these in very limited quantities.  The amount of these medications will last only until you can see your primary care physician in his/her office.  Any patient who returns to the ED seeking refills should expect only non-narcotic pain medications.   In the event of an acute medical condition exists and the emergency physician feels it is necessary that the patient be given a narcotic or sedating medication -  a responsible adult driver should be present in the room prior to the medication being given by the nurse.   Prescriptions for narcotic or sedating medications that have been lost, stolen or expired will not be refilled in the Emergency Department.    Patients who have chronic pain may receive non-narcotic prescriptions until seen by their primary care physician.  It is every patients personal responsibility to maintain active prescriptions with his or her primary care physician or specialist.  Musculoskeletal Pain Musculoskeletal pain is muscle and boney aches and pains. These pains can occur in any part of the body. Your caregiver may treat you without knowing the cause of the pain. They  may treat you if blood or urine tests, X-rays, and other tests were normal.  CAUSES There is often not a definite cause or reason for these pains. These pains may be caused by a  type of germ (virus). The discomfort may also come from overuse. Overuse includes working out too hard when your body is not fit. Boney aches also come from weather changes. Bone is sensitive to atmospheric pressure changes. HOME CARE INSTRUCTIONS   Ask when your test results will be ready. Make sure you get your test results.  Only take over-the-counter or prescription medicines for pain, discomfort, or fever as directed by your caregiver. If you were given medications for your condition, do not drive, operate machinery or power tools, or sign legal documents for 24 hours. Do not drink alcohol. Do not take sleeping pills or other medications that may interfere with treatment.  Continue all activities unless the activities cause more pain. When the pain lessens, slowly resume normal activities. Gradually increase the intensity and duration of the activities or exercise.  During periods of severe pain, bed rest may be helpful. Lay or sit in any position that is comfortable.  Putting ice on the injured area.  Put ice in a bag.  Place a towel between your skin and the bag.  Leave the ice on for 15 to 20 minutes, 3 to 4 times a day.  Follow up with your caregiver for continued problems and no reason can be found for the pain. If the pain becomes worse or does not go away, it may be necessary to repeat tests or do additional testing. Your caregiver may need to look further for a possible cause. SEEK IMMEDIATE MEDICAL CARE IF:  You have pain that is getting worse and is not relieved by medications.  You develop chest pain that is associated with shortness or breath, sweating, feeling sick to your stomach (nauseous), or throw up (vomit).  Your pain becomes localized to the abdomen.  You develop any new symptoms that seem different or that concern you. MAKE SURE YOU:   Understand these instructions.  Will watch your condition.  Will get help right away if you are not doing well or get  worse. Document Released: 12/02/2005 Document Revised: 02/24/2012 Document Reviewed: 08/06/2013 [facility] Patient Information 2015 [place], [place]. This information is not intended to replace advice given to you by your health care provider. Make sure you discuss any questions you have with your health care provider.  Trigger Point Injection Trigger points are areas where you have muscle pain. A trigger point injection is a shot given in the trigger point to relieve that pain. A trigger point might feel like a knot in your muscle. It hurts to press on a trigger point. Sometimes the pain spreads out (radiates) to other parts of the body. For example, pressing on a trigger point in your shoulder might cause pain in your arm or neck. You might have one trigger point. Or, you might have more than one. People often have trigger points in their upper back and lower back. They also occur often in the neck and shoulders. Pain from a trigger point lasts for a long time. It can make it hard to keep moving. You might not be able to do the exercise or physical therapy that could help you deal with the pain. A trigger point injection may help. It does not work for everyone. But, it may relieve your pain for a few days or a few months.  A trigger point injection does not cure long-lasting (chronic) pain. LET YOUR CAREGIVER KNOW ABOUT:  Any allergies (especially to latex, lidocaine, or steroids).  Blood-thinning medicines that you take. These drugs can lead to bleeding or bruising after an injection. They include:  Aspirin.  Ibuprofen.  Clopidogrel.  Warfarin.  Other medicines you take. This includes all vitamins, herbs, eyedrops, over-the-counter medicines, and creams.  Use of steroids.  Recent infections.  Past problems with numbing medicines.  Bleeding problems.  Surgeries you have had.  Other health problems. RISKS AND COMPLICATIONS A trigger point injection is a safe treatment. However,  problems may develop, such as:  Minor side effects usually go away in 1 to 2 days. These may include:  Soreness.  Bruising.  Stiffness.  More serious problems are rare. But, they may include:  Bleeding under the skin (hematoma).  Skin infection.  Breaking off of the needle under your skin.  Lung puncture.  The trigger point injection may not work for you. BEFORE THE PROCEDURE You may need to stop taking any medicine that thins your blood. This is to prevent bleeding and bruising. Usually these medicines are stopped several days before the injection. No other preparation is needed. PROCEDURE  A trigger point injection can be given in your caregiver's office or in a clinic. Each injection takes 2 minutes or less.  Your caregiver will feel for trigger points. The caregiver may use a marker to circle the area for the injection.  The skin over the trigger point will be washed with a germ-killing (antiseptic) solution.  The caregiver pinches the spot for the injection.  Then, a very thin needle is used for the shot. You may feel pain or a twitching feeling when the needle enters the trigger point.  A numbing solution may be injected into the trigger point. Sometimes a drug to keep down swelling, redness, and warmth (inflammation) is also injected.  Your caregiver moves the needle around the trigger zone until the tightness and twitching goes away.  After the injection, your caregiver may put gentle pressure over the injection site.  Then it is covered with a bandage. AFTER THE PROCEDURE  You can go right home after the injection.  The bandage can be taken off after a few hours.  You may feel sore and stiff for 1 to 2 days.  Go back to your regular activities slowly. Your caregiver may ask you to stretch your muscles. Do not do anything that takes extra energy for a few days.  Follow your caregiver's instructions to manage and treat other pain. Document Released:  11/21/2011 Document Revised: 03/29/2013 Document Reviewed: 11/21/2011 [facility] Patient Information 2015 [place], [place]. This information is not intended to replace advice given to you by your health care provider. Make sure you discuss any questions you have with your health care provider.

## 2015-09-16 NOTE — ED Notes (Signed)
Per patient, states injection has helped

## 2015-09-16 NOTE — ED Notes (Signed)
Patient complains of constant neck ache started 4 days ago.  She states that she has degenerative disc disease and is a patient of the pain clinic.

## 2015-09-18 ENCOUNTER — Emergency Department (HOSPITAL_COMMUNITY)
Admission: EM | Admit: 2015-09-18 | Discharge: 2015-09-18 | Disposition: A | Discharge status: Home / Self Care | Payer: Medicare Other | Attending: Emergency Medicine | Admitting: Emergency Medicine

## 2015-09-18 ENCOUNTER — Encounter (HOSPITAL_COMMUNITY): Payer: Self-pay | Admitting: *Deleted

## 2015-09-18 DIAGNOSIS — M62838 Other muscle spasm: Secondary | ICD-10-CM

## 2015-09-18 DIAGNOSIS — I10 Essential (primary) hypertension: Secondary | ICD-10-CM | POA: Diagnosis not present

## 2015-09-18 DIAGNOSIS — M542 Cervicalgia: Secondary | ICD-10-CM | POA: Diagnosis present

## 2015-09-18 DIAGNOSIS — Z7952 Long term (current) use of systemic steroids: Secondary | ICD-10-CM | POA: Diagnosis not present

## 2015-09-18 DIAGNOSIS — Z79899 Other long term (current) drug therapy: Secondary | ICD-10-CM | POA: Diagnosis not present

## 2015-09-18 DIAGNOSIS — Z792 Long term (current) use of antibiotics: Secondary | ICD-10-CM | POA: Insufficient documentation

## 2015-09-18 MED ORDER — METHOCARBAMOL 500 MG PO TABS: 500.0000 mg | ORAL_TABLET | Freq: Two times a day (BID) | ORAL | Status: DC

## 2015-09-18 MED ORDER — KETOROLAC TROMETHAMINE 60 MG/2ML IM SOLN
60.0000 mg | Freq: Once | INTRAMUSCULAR | Status: AC
  Administered 2015-09-18: 60 mg via INTRAMUSCULAR
  Filled 2015-09-18: qty 2

## 2015-09-18 NOTE — ED Notes (Signed)
Declined W/C at D/C and was escorted to lobby by RN. 

## 2015-09-18 NOTE — ED Notes (Signed)
Pt reports left shoulder pain and neck pain, was seen at [facility] on 10/1 for same and no relief with pain meds.

## 2015-09-18 NOTE — ED Provider Notes (Signed)
CSN: [id number]     Arrival date & time 09/18/15  1512 History  By signing my name below, I, [name], attest that this documentation has been prepared under the direction and in the presence of [name], PA-C. Electronically Signed: Starleen [name] ED Scribe. 09/18/2015. 4:20 PM.    Chief Complaint  Patient presents with  . Shoulder Pain  . Neck Pain   The history is provided by the patient and medical records. No language interpreter was used.   HPI Comments: [name] is a 54 y.o. female with hx of DDD, chronic pain, on pain management contract who presentscomplaining of constant, worsening, moderate left-sided neck and shoulder pain, worse with left lateral rotation of the neck, unrelieved by 20 mg oxycodone, 800 mg ibuprofen, and Goody powder.  She denies new injury and reports this episode is a typical exacerbation of her chronic pain.  Associated symptoms include normal to baseline tingling in the left hand and a left-sided headache.  The patient was seen for this complaint 10/1 in the ED where she was dx'd with muscle spasm and received an incomplete course of trigger point injections because she could not tolerate the procedure due to pain.  She denies relief from this procedure.  She tried to see her PCP today, but he was unavailable; however, she is scheduled to be seen by her pain management physician 10/12.   Past Medical History  Diagnosis Date  . Hypertension   . Degenerative disc disease   . Degenerative disc disease, cervical   . Degenerative disc disease, cervical    Past Surgical History  Procedure Laterality Date  . Back surgery    . Novasure ablation    . Tubal ligation     Family History  Problem Relation Age of Onset  . Cancer Mother   . Diabetes Father   . Heart disease Father   . Cancer Sister    Social History  Substance Use Topics  . Smoking status: Never Smoker   . Smokeless tobacco: None  . Alcohol Use: Yes   OB History    No data  available     Review of Systems A complete 10 system review of systems was obtained and all systems are negative except as noted in the HPI and PMH.   Allergies  Review of patient's allergies indicates no known allergies.  Home Medications   Prior to Admission medications   Medication Sig Start Date End Date Taking? Authorizing Provider  albuterol (PROVENTIL HFA;VENTOLIN HFA) 108 (90 BASE) MCG/ACT inhaler Inhale 2 puffs into the lungs every 6 (six) hours as needed. For shortness of breath    Historical Provider, MD  ALPRAZolam Duanne Moron) 1 MG tablet Take 2 mg by mouth daily as needed. For anxiety    Historical Provider, MD  amLODipine (NORVASC) 5 MG tablet Take 5 mg by mouth daily.    Historical Provider, MD  atenolol (TENORMIN) 50 MG tablet Take 100 mg by mouth daily.     Historical Provider, MD  cephALEXin (KEFLEX) 500 MG capsule Take 1 capsule (500 mg total) by mouth 4 (four) times daily. 08/07/13   Fransico Meadow, PA-C  clonazePAM (KLONOPIN) 1 MG tablet Take 1 mg by mouth 2 (two) times daily as needed for anxiety.    Historical Provider, MD  cyclobenzaprine (FLEXERIL) 5 MG tablet Take 1 tablet (5 mg total) by mouth 3 (three) times daily as needed for muscle spasms. 12/12/14   Harden Mo, MD  hydrochlorothiazide (HYDRODIURIL)  25 MG tablet Take 25 mg by mouth daily.    Historical Provider, MD  losartan-hydrochlorothiazide (HYZAAR) 100-25 MG per tablet Take 1 tablet by mouth daily.    Historical Provider, MD  meloxicam (MOBIC) 15 MG tablet Take 1 tablet (15 mg total) by mouth daily. 12/12/14   Harden Mo, MD  oxycodone (OXY-IR) 5 MG capsule Take 10 mg by mouth every 4 (four) hours as needed. For pain    Historical Provider, MD  predniSONE (DELTASONE) 10 MG tablet Take 2 tablets (20 mg total) by mouth daily. 12/12/13   Tiffany Carlota Raspberry, PA-C   BP 157/92 mmHg  Pulse 81  Temp(Src) 98.5 F (36.9 C) (Oral)  Resp 16  SpO2 100%  LMP 04/20/2011 Physical Exam  Constitutional: She is  oriented to person, place, and time. She appears well-developed and well-nourished. No distress.  HENT:  Head: Normocephalic and atraumatic.  Eyes: Conjunctivae and EOM are normal.  Neck: Normal range of motion. Neck supple. No tracheal deviation present.  Mild TTP of left trapezius and left cervical paraspinal muscle. No meningismus.  Cardiovascular: Normal rate.   Pulmonary/Chest: Effort normal. No respiratory distress.  Musculoskeletal: Normal range of motion.  Neurological: She is alert and oriented to person, place, and time.  Strength 5/5 throughout. No sensory deficits.    Skin: Skin is warm and dry.  Psychiatric: She has a normal mood and affect. Her behavior is normal.  Nursing note and vitals reviewed.   ED Course  Procedures (including critical care time)  DIAGNOSTIC STUDIES: Oxygen Saturation is 100% on Ra, normal by my interpretation.    COORDINATION OF CARE:  4:22 PM Discussed treatment plan with patient at bedside.  Patient acknowledges and agrees with plan.     Labs Review Labs Reviewed - No data to display  Imaging Review No results found.    EKG Interpretation None      MDM   Final diagnoses:  Muscle spasms of head or neck    Pt seen with acute on chronic neck pain and muscle spasm. Last seen at [facility] on 10/1 and given incomplete trigger injection with no relief. Will give toradol shot in ED and discharge home with Robaxin. Recommend follow up with PCP and pain clinic. Apply heat to affected area for additional relief.   I personally performed the services described in this documentation, which was scribed in my presence. The recorded information has been reviewed and is accurate.    [name], PA-C 09/18/15 [place], MD 09/20/15 [phone]

## 2015-09-18 NOTE — Discharge Instructions (Signed)
Muscle Cramps and Spasms °Muscle cramps and spasms occur when a muscle or muscles tighten and you have no control over this tightening (involuntary muscle contraction). They are a common problem and can develop in any muscle. The most common place is in the calf muscles of the leg. Both muscle cramps and muscle spasms are involuntary muscle contractions, but they also have differences:  °· Muscle cramps are sporadic and painful. They may last a few seconds to a quarter of an hour. Muscle cramps are often more forceful and last longer than muscle spasms. °· Muscle spasms may or may not be painful. They may also last just a few seconds or much longer. °CAUSES  °It is uncommon for cramps or spasms to be due to a serious underlying problem. In many cases, the cause of cramps or spasms is unknown. Some common causes are:  °· Overexertion.   °· Overuse from repetitive motions (doing the same thing over and over).   °· Remaining in a certain position for a long period of time.   °· Improper preparation, form, or technique while performing a sport or activity.   °· Dehydration.   °· Injury.   °· Side effects of some medicines.   °· Abnormally low levels of the salts and ions in your blood (electrolytes), especially potassium and calcium. This could happen if you are taking water pills (diuretics) or you are pregnant.   °Some underlying medical problems can make it more likely to develop cramps or spasms. These include, but are not limited to:  °· Diabetes.   °· Parkinson disease.   °· Hormone disorders, such as thyroid problems.   °· Alcohol abuse.   °· Diseases specific to muscles, joints, and bones.   °· Blood vessel disease where not enough blood is getting to the muscles.   °HOME CARE INSTRUCTIONS  °· Stay well hydrated. Drink enough water and fluids to keep your urine clear or pale yellow. °· It may be helpful to massage, stretch, and relax the affected muscle. °· For tight or tense muscles, use a warm towel, heating  pad, or hot shower water directed to the affected area. °· If you are sore or have pain after a cramp or spasm, applying ice to the affected area may relieve discomfort. °· Put ice in a plastic bag. °· Place a towel between your skin and the bag. °· Leave the ice on for 15-20 minutes, 03-04 times a day. °· Medicines used to treat a known cause of cramps or spasms may help reduce their frequency or severity. Only take over-the-counter or prescription medicines as directed by your caregiver. °SEEK MEDICAL CARE IF:  °Your cramps or spasms get more severe, more frequent, or do not improve over time.  °MAKE SURE YOU:  °· Understand these instructions. °· Will watch your condition. °· Will get help right away if you are not doing well or get worse. °Document Released: 05/24/2002 Document Revised: 03/29/2013 Document Reviewed: 11/18/2012 °[employer]® Patient Information ©2015 [employer]. This information is not intended to replace advice given to you by your health care provider. Make sure you discuss any questions you have with your health care provider. ° °Heat Therapy °Heat therapy can help make painful, stiff muscles and joints feel better. Do not use heat on new injuries. Wait at least 48 hours after an injury to use heat. Do not use heat when you have aches or pains right after an activity. If you still have pain 3 hours after stopping the activity, then you may use heat. °HOME CARE °Wet heat pack °· Soak   a clean towel in warm water. Squeeze out the extra water.  Put the warm, wet towel in a plastic bag.  Place a thin, dry towel between your skin and the bag.  Put the heat pack on the area for 5 minutes, and check your skin. Your skin may be pink, but it should not be red.  Leave the heat pack on the area for 15 to 30 minutes.  Repeat this every 2 to 4 hours while awake. Do not use heat while you are sleeping. Warm water bath  Fill a tub with warm water.  Place the affected body part in the  tub.  Soak the area for 20 to 40 minutes.  Repeat as needed. Hot water bottle  Fill the water bottle half full with hot water.  Press out the extra air. Close the cap tightly.  Place a dry towel between your skin and the bottle.  Put the bottle on the area for 5 minutes, and check your skin. Your skin may be pink, but it should not be red.  Leave the bottle on the area for 15 to 30 minutes.  Repeat this every 2 to 4 hours while awake. Electric heating pad  Place a dry towel between your skin and the heating pad.  Set the heating pad on low heat.  Put the heating pad on the area for 10 minutes, and check your skin. Your skin may be pink, but it should not be red.  Leave the heating pad on the area for 20 to 40 minutes.  Repeat this every 2 to 4 hours while awake.  Do not lie on the heating pad.  Do not fall asleep while using the heating pad.  Do not use the heating pad near water. GET HELP RIGHT AWAY IF:  You get blisters or red skin.  Your skin is puffy (swollen), or you lose feeling (numbness) in the affected area.  You have any new problems.  Your problems are getting worse.  You have any questions or concerns. If you have any problems, stop using heat therapy until you see your doctor. MAKE SURE YOU:  Understand these instructions.  Will watch your condition.  Will get help right away if you are not doing well or get worse. Document Released: 02/24/2012 Document Reviewed: 01/25/2014 [facility] Patient Information 2015 [place]. This information is not intended to replace advice given to you by your health care provider. Make sure you discuss any questions you have with your health care provider.  Apply heat to affeccted are for symptomatic relief. Follow up with PCP. Take home pain medications as prescribed. Return to the ED if you experience worsening of you symptoms, new numbness/tingling in extremities, fever.

## 2015-09-20 ENCOUNTER — Ambulatory Visit (HOSPITAL_BASED_OUTPATIENT_CLINIC_OR_DEPARTMENT_OTHER): Payer: Medicare Other

## 2015-09-21 ENCOUNTER — Encounter (HOSPITAL_COMMUNITY): Payer: Self-pay | Admitting: Family Medicine

## 2015-09-21 ENCOUNTER — Emergency Department (HOSPITAL_COMMUNITY)
Admission: EM | Admit: 2015-09-21 | Discharge: 2015-09-21 | Disposition: A | Discharge status: Home / Self Care | Payer: Medicare Other | Attending: Emergency Medicine | Admitting: Emergency Medicine

## 2015-09-21 ENCOUNTER — Emergency Department (HOSPITAL_COMMUNITY): Payer: Medicare Other

## 2015-09-21 DIAGNOSIS — R011 Cardiac murmur, unspecified: Secondary | ICD-10-CM | POA: Insufficient documentation

## 2015-09-21 DIAGNOSIS — R000 Tachycardia, unspecified: Secondary | ICD-10-CM

## 2015-09-21 DIAGNOSIS — Z8739 Personal history of other diseases of the musculoskeletal system and connective tissue: Secondary | ICD-10-CM | POA: Diagnosis not present

## 2015-09-21 DIAGNOSIS — J45901 Unspecified asthma with (acute) exacerbation: Secondary | ICD-10-CM | POA: Insufficient documentation

## 2015-09-21 DIAGNOSIS — Z79899 Other long term (current) drug therapy: Secondary | ICD-10-CM | POA: Diagnosis not present

## 2015-09-21 DIAGNOSIS — R0600 Dyspnea, unspecified: Secondary | ICD-10-CM

## 2015-09-21 DIAGNOSIS — R0602 Shortness of breath: Secondary | ICD-10-CM | POA: Diagnosis present

## 2015-09-21 DIAGNOSIS — I10 Essential (primary) hypertension: Secondary | ICD-10-CM | POA: Diagnosis not present

## 2015-09-21 HISTORY — DX: Unspecified asthma, uncomplicated: J45.909

## 2015-09-21 LAB — CBC WITH DIFFERENTIAL/PLATELET
BASOS PCT: 0 %
Basophils Absolute: 0 10*3/uL (ref 0.0–0.1)
EOS ABS: 0.1 10*3/uL (ref 0.0–0.7)
Eosinophils Relative: 1 %
HCT: 36.5 % (ref 36.0–46.0)
HEMOGLOBIN: 12.2 g/dL (ref 12.0–15.0)
Lymphocytes Relative: 28 %
Lymphs Abs: 1.9 10*3/uL (ref 0.7–4.0)
MCH: 30.2 pg (ref 26.0–34.0)
MCHC: 33.4 g/dL (ref 30.0–36.0)
MCV: 90.3 fL (ref 78.0–100.0)
MONOS PCT: 7 %
Monocytes Absolute: 0.5 10*3/uL (ref 0.1–1.0)
NEUTROS PCT: 64 %
Neutro Abs: 4.5 10*3/uL (ref 1.7–7.7)
Platelets: 339 10*3/uL (ref 150–400)
RBC: 4.04 MIL/uL (ref 3.87–5.11)
RDW: 13.7 % (ref 11.5–15.5)
WBC: 6.9 10*3/uL (ref 4.0–10.5)

## 2015-09-21 LAB — BASIC METABOLIC PANEL
Anion gap: 5 (ref 5–15)
BUN: 21 mg/dL — ABNORMAL HIGH (ref 6–20)
CALCIUM: 9 mg/dL (ref 8.9–10.3)
CHLORIDE: 108 mmol/L (ref 101–111)
CO2: 25 mmol/L (ref 22–32)
CREATININE: 0.92 mg/dL (ref 0.44–1.00)
Glucose, Bld: 143 mg/dL — ABNORMAL HIGH (ref 65–99)
Potassium: 2.9 mmol/L — ABNORMAL LOW (ref 3.5–5.1)
SODIUM: 138 mmol/L (ref 135–145)

## 2015-09-21 LAB — PROTIME-INR
INR: 1.05 (ref 0.00–1.49)
PROTHROMBIN TIME: 13.9 s (ref 11.6–15.2)

## 2015-09-21 LAB — TSH: TSH: 1.166 u[IU]/mL (ref 0.350–4.500)

## 2015-09-21 LAB — D-DIMER, QUANTITATIVE: D-Dimer, Quant: 0.7 ug/mL-FEU — ABNORMAL HIGH (ref 0.00–0.48)

## 2015-09-21 LAB — TROPONIN I: Troponin I: <0.03 ng/mL (ref <0.031)

## 2015-09-21 LAB — BRAIN NATRIURETIC PEPTIDE: B Natriuretic Peptide: 12.8 pg/mL (ref 0.0–100.0)

## 2015-09-21 MED ORDER — IOHEXOL 350 MG/ML SOLN
100.0000 mL | Freq: Once | INTRAVENOUS | Status: AC | PRN
  Administered 2015-09-21: 100 mL via INTRAVENOUS

## 2015-09-21 MED ORDER — SODIUM CHLORIDE 0.9 % IV BOLUS (SEPSIS)
1000.0000 mL | Freq: Once | INTRAVENOUS | Status: AC
  Administered 2015-09-21: 1000 mL via INTRAVENOUS

## 2015-09-21 NOTE — Discharge Instructions (Signed)
Shortness of Breath Shortness of breath means you have trouble breathing. It could also mean that you have a medical problem. You should get immediate medical care for shortness of breath. CAUSES   Not enough oxygen in the air such as with high altitudes or a smoke-filled room.  Certain lung diseases, infections, or problems.  Heart disease or conditions, such as angina or heart failure.  Low red blood cells (anemia).  Poor physical fitness, which can cause shortness of breath when you exercise.  Chest or back injuries or stiffness.  Being overweight.  Smoking.  Anxiety, which can make you feel like you are not getting enough air. DIAGNOSIS  Serious medical problems can often be found during your physical exam. Tests may also be done to determine why you are having shortness of breath. Tests may include:  Chest X-rays.  Lung function tests.  Blood tests.  An electrocardiogram (ECG).  An ambulatory electrocardiogram. An ambulatory ECG records your heartbeat patterns over a 24-hour period.  Exercise testing.  A transthoracic echocardiogram (TTE). During echocardiography, sound waves are used to evaluate how blood flows through your heart.  A transesophageal echocardiogram (TEE).  Imaging scans. Your health care provider may not be able to find a cause for your shortness of breath after your exam. In this case, it is important to have a follow-up exam with your health care provider as directed.  TREATMENT  Treatment for shortness of breath depends on the cause of your symptoms and can vary greatly. HOME CARE INSTRUCTIONS   Do not smoke. Smoking is a common cause of shortness of breath. If you smoke, ask for help to quit.  Avoid being around chemicals or things that may bother your breathing, such as paint fumes and dust.  Rest as needed. Slowly resume your usual activities.  If medicines were prescribed, take them as directed for the full length of time directed. This  includes oxygen and any inhaled medicines.  Keep all follow-up appointments as directed by your health care provider. SEEK MEDICAL CARE IF:   Your condition does not improve in the time expected.  You have a hard time doing your normal activities even with rest.  You have any new symptoms. SEEK IMMEDIATE MEDICAL CARE IF:   Your shortness of breath gets worse.  You feel light-headed, faint, or develop a cough not controlled with medicines.  You start coughing up blood.  You have pain with breathing.  You have chest pain or pain in your arms, shoulders, or abdomen.  You have a fever.  You are unable to walk up stairs or exercise the way you normally do. MAKE SURE YOU:  Understand these instructions.  Will watch your condition.  Will get help right away if you are not doing well or get worse.   This information is not intended to replace advice given to you by your health care provider. Make sure you discuss any questions you have with your health care provider.   Document Released: 08/27/2001 Document Revised: 12/07/2013 Document Reviewed: 02/17/2012 Elsevier Interactive Patient Education 2016 [employer]  Nonspecific Tachycardia Tachycardia is a faster than normal heartbeat (more than 100 beats per minute). In adults, the heart normally beats between 60 and 100 times a minute. A fast heartbeat may be a normal response to exercise or stress. It does not necessarily mean that something is wrong. However, sometimes when your heart beats too fast it may not be able to pump enough blood to the rest of your body.  This can result in chest pain, shortness of breath, dizziness, and even fainting. Nonspecific tachycardia means that the specific cause or pattern of your tachycardia is unknown. CAUSES  Tachycardia may be harmless or it may be due to a more serious underlying cause. Possible causes of tachycardia include:  Exercise or exertion.  Fever.  Pain or  injury.  Infection.  Loss of body fluids (dehydration).  Overactive thyroid.  Lack of red blood cells (anemia).  Anxiety and stress.  Alcohol.  Caffeine.  Tobacco products.  Diet pills.  Illegal drugs.  Heart disease. SYMPTOMS  Rapid or irregular heartbeat (palpitations).  Suddenly feeling your heart beating (cardiac awareness).  Dizziness.  Tiredness (fatigue).  Shortness of breath.  Chest pain.  Nausea.  Fainting. DIAGNOSIS  Your caregiver will perform a physical exam and take your medical history. In some cases, a heart specialist (cardiologist) may be consulted. Your caregiver may also order:  Blood tests.  Electrocardiography. This test records the electrical activity of your heart.  A heart monitoring test. TREATMENT  Treatment will depend on the likely cause of your tachycardia. The goal is to treat the underlying cause of your tachycardia. Treatment methods may include:  Replacement of fluids or blood through an intravenous (IV) tube for moderate to severe dehydration or anemia.  New medicines or changes in your current medicines.  Diet and lifestyle changes.  Treatment for certain infections.  Stress relief or relaxation methods. HOME CARE INSTRUCTIONS   Rest.  Drink enough fluids to keep your urine clear or pale yellow.  Do not smoke.  Avoid:  Caffeine.  Tobacco.  Alcohol.  Chocolate.  Stimulants such as over-the-counter diet pills or pills that help you stay awake.  Situations that cause anxiety or stress.  Illegal drugs such as marijuana, phencyclidine (PCP), and cocaine.  Only take medicine as directed by your caregiver.  Keep all follow-up appointments as directed by your caregiver. SEEK IMMEDIATE MEDICAL CARE IF:   You have pain in your chest, upper arms, jaw, or neck.  You become weak, dizzy, or feel faint.  You have palpitations that will not go away.  You vomit, have diarrhea, or pass blood in your  stool.  Your skin is cool, pale, and wet.  You have a fever that will not go away with rest, fluids, and medicine. MAKE SURE YOU:   Understand these instructions.  Will watch your condition.  Will get help right away if you are not doing well or get worse.   This information is not intended to replace advice given to you by your health care provider. Make sure you discuss any questions you have with your health care provider.   Document Released: 01/09/2005 Document Revised: 02/24/2012 Document Reviewed: 06/16/2015 Elsevier Interactive Patient Education [employer].

## 2015-09-21 NOTE — ED Provider Notes (Signed)
8:16 AM Care transferred to me. Patient's CT scan is negative for a pulmonary embolism although was suboptimal bolus. Patient feels well, mild tightness but is in no acute distress. Plan for discharge home, treat asthma symptomatically at home and follow-up with her PCP next week. Discussed strict return precautions with patient.  Results for orders placed or performed during the hospital encounter of 09/21/15  CBC with Differential/Platelet  Result Value Ref Range   WBC 6.9 4.0 - 10.5 K/uL   RBC 4.04 3.87 - 5.11 MIL/uL   Hemoglobin 12.2 12.0 - 15.0 g/dL   HCT 36.5 36.0 - 46.0 %   MCV 90.3 78.0 - 100.0 fL   MCH 30.2 26.0 - 34.0 pg   MCHC 33.4 30.0 - 36.0 g/dL   RDW 13.7 11.5 - 15.5 %   Platelets 339 150 - 400 K/uL   Neutrophils Relative % 64 %   Neutro Abs 4.5 1.7 - 7.7 K/uL   Lymphocytes Relative 28 %   Lymphs Abs 1.9 0.7 - 4.0 K/uL   Monocytes Relative 7 %   Monocytes Absolute 0.5 0.1 - 1.0 K/uL   Eosinophils Relative 1 %   Eosinophils Absolute 0.1 0.0 - 0.7 K/uL   Basophils Relative 0 %   Basophils Absolute 0.0 0.0 - 0.1 K/uL  Basic metabolic panel  Result Value Ref Range   Sodium 138 135 - 145 mmol/L   Potassium 2.9 (L) 3.5 - 5.1 mmol/L   Chloride 108 101 - 111 mmol/L   CO2 25 22 - 32 mmol/L   Glucose, Bld 143 (H) 65 - 99 mg/dL   BUN 21 (H) 6 - 20 mg/dL   Creatinine, Ser 0.92 0.44 - 1.00 mg/dL   Calcium 9.0 8.9 - 10.3 mg/dL   GFR calc non Af Amer >60 >60 mL/min   GFR calc Af Amer >60 >60 mL/min   Anion gap 5 5 - 15  Brain natriuretic peptide  Result Value Ref Range   B Natriuretic Peptide 12.8 0.0 - 100.0 pg/mL  Troponin I  Result Value Ref Range   Troponin I <0.03 <0.031 ng/mL  Protime-INR  Result Value Ref Range   Prothrombin Time 13.9 11.6 - 15.2 seconds   INR 1.05 0.00 - 1.49  D-dimer, quantitative (not at [facility])  Result Value Ref Range   D-Dimer, Quant 0.70 (H) 0.00 - 0.48 ug/mL-FEU  TSH  Result Value Ref Range   TSH 1.166 0.350 - 4.500 uIU/mL   Dg Chest  2 View  09/21/2015   CLINICAL DATA:  Acute onset of shortness of breath. Initial encounter.  EXAM: CHEST  2 VIEW  COMPARISON:  Chest radiograph performed 12/12/2014  FINDINGS: The lungs are well-aerated. There is mild elevation of the left hemidiaphragm. Mild vascular congestion is noted. There is no evidence of focal opacification, pleural effusion or pneumothorax.  The heart is normal in size; the mediastinal contour is within normal limits. No acute osseous abnormalities are seen. Cervical spinal fusion hardware is noted.  IMPRESSION: Mild vascular congestion noted. Lungs remain grossly clear. Mild elevation of the left hemidiaphragm.   Electronically Signed   By: Garald Balding M.D.   On: 09/21/2015 04:58   Ct Angio Chest Pe W/cm &/or Wo Cm  09/21/2015   CLINICAL DATA:  54 year old female with acute onset chest pain and shortness of breath that will curb. Initial encounter.  EXAM: CT ANGIOGRAPHY CHEST WITH CONTRAST  TECHNIQUE: Multidetector CT imaging of the chest was performed using the standard protocol during bolus administration  of intravenous contrast. Multiplanar CT image reconstructions and MIPs were obtained to evaluate the vascular anatomy.  CONTRAST:  156mL OMNIPAQUE IOHEXOL 350 MG/ML SOLN  COMPARISON:  Chest radiographs 0450 hr today and earlier.  FINDINGS: Suboptimal contrast bolus timing in the pulmonary arterial tree. Mild respiratory motion artifacts superimposed. No central or hilar pulmonary artery filling defect. No definite more distal pulmonary artery filling defect.  Large body habitus. No pericardial or pleural effusion. Negative visualized aorta. Tortuous proximal great vessels. No mediastinal lymphadenopathy. No axillary or thoracic inlet lymphadenopathy.  Major airways are patent. Elevated left hemidiaphragm. Platelike opacity in the left lower lobe. Superimposed dependent patchy in ground-glass opacity in both lungs. No other abnormal pulmonary opacity.  Negative visualized upper  abdominal viscera.  No acute osseous abnormality identified.  Review of the MIP images confirms the above findings.  IMPRESSION: 1. Suboptimal contrast bolus timing in the pulmonary arteries with no definite acute pulmonary embolus. 2. Pulmonary atelectasis greater on the left.   Electronically Signed   By: Genevie Ann M.D.   On: 09/21/2015 08:08      Sherwood Gambler, MD 09/21/15 [phone]

## 2015-09-21 NOTE — ED Provider Notes (Signed)
CSN: [id number]     Arrival date & time 09/21/15  7867 History  By signing my name below, I, Emmanuella Mensah, attest that this documentation has been prepared under the direction and in the presence of Varney Biles, MD. Electronically Signed: Judithann Sauger, ED Scribe. 09/21/2015. 3:55 AM.     Chief Complaint  Patient presents with  . Asthma   The history is provided by the patient. No language interpreter was used.   HPI Comments: [name] is a 54 y.o. female with a hx of asthma who presents to the Emergency Department status post an asthma attack onset one hour ago. She explains that she was sleeping and she woke up with SOB so she took a 1 mg Xanax and used her inhaler with no relief. She denies any wheezing or CP.  She denies any hx of PE, DVT or recent surgeries. She reports that she recently went to [facility] by car but there were breaks taken along the way.   Past Medical History  Diagnosis Date  . Hypertension   . Degenerative disc disease   . Degenerative disc disease, cervical   . Degenerative disc disease, cervical   . Asthma    Past Surgical History  Procedure Laterality Date  . Back surgery    . Novasure ablation    . Tubal ligation     Family History  Problem Relation Age of Onset  . Cancer Mother   . Diabetes Father   . Heart disease Father   . Cancer Sister    Social History  Substance Use Topics  . Smoking status: Never Smoker   . Smokeless tobacco: None  . Alcohol Use: No   OB History    No data available     Review of Systems  Respiratory: Positive for shortness of breath. Negative for wheezing.   Cardiovascular: Negative for chest pain.    ROS 10 Systems reviewed and are negative for acute change except as noted in the HPI.      Allergies  Review of patient's allergies indicates no known allergies.  Home Medications   Prior to Admission medications   Medication Sig Start Date End Date Taking? Authorizing Provider  albuterol  (PROVENTIL HFA;VENTOLIN HFA) 108 (90 BASE) MCG/ACT inhaler Inhale 2 puffs into the lungs every 6 (six) hours as needed for shortness of breath.    Yes Historical Provider, MD  ALPRAZolam Duanne Moron) 1 MG tablet Take 2 mg by mouth daily as needed. For anxiety   Yes Historical Provider, MD  amLODipine (NORVASC) 5 MG tablet Take 5 mg by mouth daily.   Yes Historical Provider, MD  atenolol (TENORMIN) 50 MG tablet Take 50 mg by mouth daily.    Yes Historical Provider, MD  citalopram (CELEXA) 20 MG tablet Take 20 mg by mouth daily. 09/06/15  Yes Historical Provider, MD  ibuprofen (ADVIL,MOTRIN) 800 MG tablet Take 800 mg by mouth 3 (three) times daily as needed. 08/28/15  Yes Historical Provider, MD  losartan-hydrochlorothiazide (HYZAAR) 100-25 MG per tablet Take 1 tablet by mouth daily.   Yes Historical Provider, MD  methocarbamol (ROBAXIN) 500 MG tablet Take 1 tablet (500 mg total) by mouth 2 (two) times daily. 09/18/15  Yes Samantha Tripp Dowless, PA-C  Oxycodone HCl 20 MG TABS Take 1 tablet by mouth every 6 (six) hours. 08/28/15  Yes Historical Provider, MD  cephALEXin (KEFLEX) 500 MG capsule Take 1 capsule (500 mg total) by mouth 4 (four) times daily. Patient not taking: Reported on 09/21/2015  08/07/13   Fransico Meadow, PA-C  cyclobenzaprine (FLEXERIL) 5 MG tablet Take 1 tablet (5 mg total) by mouth 3 (three) times daily as needed for muscle spasms. Patient not taking: Reported on 09/21/2015 12/12/14   Harden Mo, MD  meloxicam (MOBIC) 15 MG tablet Take 1 tablet (15 mg total) by mouth daily. Patient not taking: Reported on 09/21/2015 12/12/14   Harden Mo, MD  predniSONE (DELTASONE) 10 MG tablet Take 2 tablets (20 mg total) by mouth daily. Patient not taking: Reported on 09/21/2015 12/12/13   Delos Haring, PA-C   BP 144/83 mmHg  Pulse 98  Temp(Src) 98 F (36.7 C) (Oral)  Resp 18  Ht 5\' 8"  (1.727 m)  Wt 302 lb (136.986 kg)  BMI 45.93 kg/m2  SpO2 100%  LMP 04/20/2011 Physical Exam   Constitutional: She is oriented to person, place, and time. She appears well-developed and well-nourished.  HENT:  Head: Normocephalic and atraumatic.  Cardiovascular: Normal rate.   Tachycardia with a systolic murmur 2+ radial pulse No JVD  Pulmonary/Chest: Effort normal and breath sounds normal. She has no wheezes. She has no rales.  Abdominal: She exhibits no distension.  Musculoskeletal:  No unilateral calf swelling, no pitting edema, no calf tenderness  Neurological: She is alert and oriented to person, place, and time.  Skin: Skin is warm and dry.  Psychiatric: She has a normal mood and affect.  Nursing note and vitals reviewed.   ED Course  Procedures (including critical care time) DIAGNOSTIC STUDIES: Oxygen Saturation is 100% on RA, normal by my interpretation.    COORDINATION OF CARE: 3:52 AM- Pt advised of plan for treatment and pt agrees.    Labs Review Labs Reviewed  BASIC METABOLIC PANEL - Abnormal; Notable for the following:    Potassium 2.9 (*)    Glucose, Bld 143 (*)    BUN 21 (*)    All other components within normal limits  D-DIMER, QUANTITATIVE (NOT AT [facility]) - Abnormal; Notable for the following:    D-Dimer, Quant 0.70 (*)    All other components within normal limits  CBC WITH DIFFERENTIAL/PLATELET  BRAIN NATRIURETIC PEPTIDE  TROPONIN I  PROTIME-INR  TSH    Imaging Review Dg Chest 2 View  09/21/2015   CLINICAL DATA:  Acute onset of shortness of breath. Initial encounter.  EXAM: CHEST  2 VIEW  COMPARISON:  Chest radiograph performed 12/12/2014  FINDINGS: The lungs are well-aerated. There is mild elevation of the left hemidiaphragm. Mild vascular congestion is noted. There is no evidence of focal opacification, pleural effusion or pneumothorax.  The heart is normal in size; the mediastinal contour is within normal limits. No acute osseous abnormalities are seen. Cervical spinal fusion hardware is noted.  IMPRESSION: Mild vascular congestion noted. Lungs  remain grossly clear. Mild elevation of the left hemidiaphragm.   Electronically Signed   By: Garald Balding M.D.   On: 09/21/2015 04:58   Varney Biles, MD has personally reviewed and evaluated these images and lab results as part of his medical decision-making.   EKG Interpretation   Date/Time:  Thursday September 21 2015 04:34:39 EDT Ventricular Rate:  104 PR Interval:  171 QRS Duration: 81 QT Interval:  371 QTC Calculation: 488 R Axis:   17 Text Interpretation:  Sinus tachycardia Probable left atrial enlargement  Low voltage, precordial leads Borderline repolarization abnormality  Borderline prolonged QT interval s1q3t3 is unchanged from before Confirmed  by Kathrynn Humble, MD, Sanai Frick ([id number]) on 09/21/2015 5:06:49 AM  MDM   Final diagnoses:  Tachycardia  Dyspnea    I personally performed the services described in this documentation, which was scribed in my presence. The recorded information has been reviewed and is accurate.  Pt comes in with cc of dib. She woke up with shortness of breath. Hx appears to indicate orthopnea.  She has no chest pain. She denies wheezing. She is not wheezing currently. She reports feeling better after the breathing tx. Pt has no cardiac hx.  Pt has no hx of PE, DVT and denies any exogenous estrogen use, long distance travels or surgery in the past 6 weeks, active cancer, recent immobilization. Exam is not consistent with CHF.  EKG reviewed, pt has s1q3t3 pattern.  Plan is to get CXR. We will get BNP. After the results have returned, i will reassess the patient, and decide if PE workup needs to be undertaken.  7:36 AM We got ambulatory pulsox, and pt's HR went to 140. Dimer ordered, and is +. CT PE ordered.      Varney Biles, MD 09/21/15 [phone]

## 2015-09-21 NOTE — ED Notes (Addendum)
Patient is from home and transported by [facility]. Pt is complaining of an asthma attack. Pt used her inhaler and XANAX 0.5mg  with no relief before calling EMS. EMS administered ALBUTEROL 10mg  nebulizer.

## 2015-09-21 NOTE — ED Notes (Signed)
Ambulated patient in the hallway. Pt's O2 dropped to 97% but heart rate increased to 130. Pt denied being short of breath. Notified provider. New order obtained.

## 2016-02-12 ENCOUNTER — Encounter: Payer: Self-pay | Admitting: Gastroenterology

## 2016-03-16 ENCOUNTER — Emergency Department (HOSPITAL_COMMUNITY): Payer: Medicare Other

## 2016-03-16 ENCOUNTER — Emergency Department (HOSPITAL_COMMUNITY)
Admission: EM | Admit: 2016-03-16 | Discharge: 2016-03-16 | Disposition: A | Discharge status: Home / Self Care | Payer: Medicare Other | Attending: Emergency Medicine | Admitting: Emergency Medicine

## 2016-03-16 ENCOUNTER — Encounter (HOSPITAL_COMMUNITY): Payer: Self-pay

## 2016-03-16 DIAGNOSIS — Z79899 Other long term (current) drug therapy: Secondary | ICD-10-CM | POA: Diagnosis not present

## 2016-03-16 DIAGNOSIS — I10 Essential (primary) hypertension: Secondary | ICD-10-CM | POA: Diagnosis not present

## 2016-03-16 DIAGNOSIS — J45901 Unspecified asthma with (acute) exacerbation: Secondary | ICD-10-CM | POA: Diagnosis not present

## 2016-03-16 DIAGNOSIS — R079 Chest pain, unspecified: Secondary | ICD-10-CM | POA: Diagnosis present

## 2016-03-16 DIAGNOSIS — Z8739 Personal history of other diseases of the musculoskeletal system and connective tissue: Secondary | ICD-10-CM | POA: Diagnosis not present

## 2016-03-16 MED ORDER — PREDNISONE 20 MG PO TABS
60.0000 mg | ORAL_TABLET | Freq: Once | ORAL | Status: AC
  Administered 2016-03-16: 60 mg via ORAL
  Filled 2016-03-16: qty 3

## 2016-03-16 MED ORDER — PREDNISONE 20 MG PO TABS: 20.0000 mg | ORAL_TABLET | Freq: Two times a day (BID) | ORAL | Status: DC

## 2016-03-16 MED ORDER — ALBUTEROL SULFATE (2.5 MG/3ML) 0.083% IN NEBU
2.5000 mg | INHALATION_SOLUTION | Freq: Once | RESPIRATORY_TRACT | Status: AC
  Administered 2016-03-16: 2.5 mg via RESPIRATORY_TRACT
  Filled 2016-03-16: qty 3

## 2016-03-16 NOTE — ED Provider Notes (Signed)
CSN: [id number]     Arrival date & time 03/16/16  0701 History   First MD Initiated Contact with Patient 03/16/16 0719     Chief Complaint  Patient presents with  . Chest Pain     HPI  Patient presents for evaluation of difficulty breathing. Has history of asthma. He uses an inhaler daily, and when necessary. Awakened this morning at 2 AM with a feeling of tightness feels like her typical asthma. She is nebulizer treatment. States she has minimal improvement. Has had a dry nonproductive cough and cold congestion" for the last 2 days. No fever shakes or chills. No body aches or other flu symptoms. No edema. No pressure anginal type pain.  No history of known heart disease.  Past Medical History  Diagnosis Date  . Hypertension   . Degenerative disc disease   . Degenerative disc disease, cervical   . Degenerative disc disease, cervical   . Asthma    Past Surgical History  Procedure Laterality Date  . Back surgery    . Novasure ablation    . Tubal ligation     Family History  Problem Relation Age of Onset  . Cancer Mother   . Diabetes Father   . Heart disease Father   . Cancer Sister    Social History  Substance Use Topics  . Smoking status: Never Smoker   . Smokeless tobacco: None  . Alcohol Use: No   OB History    No data available     Review of Systems  Constitutional: Negative for fever, chills, diaphoresis, appetite change and fatigue.  HENT: Negative for mouth sores, sore throat and trouble swallowing.   Eyes: Negative for visual disturbance.  Respiratory: Positive for chest tightness and shortness of breath. Negative for cough and wheezing.   Cardiovascular: Negative for chest pain.  Gastrointestinal: Negative for nausea, vomiting, abdominal pain, diarrhea and abdominal distention.  Endocrine: Negative for polydipsia, polyphagia and polyuria.  Genitourinary: Negative for dysuria, frequency and hematuria.  Musculoskeletal: Negative for gait problem.  Skin:  Negative for color change, pallor and rash.  Neurological: Negative for dizziness, syncope, light-headedness and headaches.  Hematological: Does not bruise/bleed easily.  Psychiatric/Behavioral: Negative for behavioral problems and confusion.      Allergies  Review of patient's allergies indicates no known allergies.  Home Medications   Prior to Admission medications   Medication Sig Start Date End Date Taking? Authorizing Provider  albuterol (PROVENTIL HFA;VENTOLIN HFA) 108 (90 BASE) MCG/ACT inhaler Inhale 2 puffs into the lungs every 6 (six) hours as needed for shortness of breath.     Historical Provider, MD  ALPRAZolam Duanne Moron) 1 MG tablet Take 2 mg by mouth daily as needed. For anxiety    Historical Provider, MD  amLODipine (NORVASC) 5 MG tablet Take 5 mg by mouth daily.    Historical Provider, MD  atenolol (TENORMIN) 50 MG tablet Take 50 mg by mouth daily.     Historical Provider, MD  cephALEXin (KEFLEX) 500 MG capsule Take 1 capsule (500 mg total) by mouth 4 (four) times daily. Patient not taking: Reported on 09/21/2015 08/07/13   Fransico Meadow, PA-C  citalopram (CELEXA) 20 MG tablet Take 20 mg by mouth daily. 09/06/15   Historical Provider, MD  cyclobenzaprine (FLEXERIL) 5 MG tablet Take 1 tablet (5 mg total) by mouth 3 (three) times daily as needed for muscle spasms. Patient not taking: Reported on 09/21/2015 12/12/14   Harden Mo, MD  ibuprofen (ADVIL,MOTRIN) 800 MG tablet Take  800 mg by mouth 3 (three) times daily as needed. 08/28/15   Historical Provider, MD  losartan-hydrochlorothiazide (HYZAAR) 100-25 MG per tablet Take 1 tablet by mouth daily.    Historical Provider, MD  meloxicam (MOBIC) 15 MG tablet Take 1 tablet (15 mg total) by mouth daily. Patient not taking: Reported on 09/21/2015 12/12/14   Harden Mo, MD  methocarbamol (ROBAXIN) 500 MG tablet Take 1 tablet (500 mg total) by mouth 2 (two) times daily. 09/18/15   Samantha Tripp Dowless, PA-C  Oxycodone HCl 20 MG  TABS Take 1 tablet by mouth every 6 (six) hours. 08/28/15   Historical Provider, MD  predniSONE (DELTASONE) 20 MG tablet Take 1 tablet (20 mg total) by mouth 2 (two) times daily with a meal. 03/16/16   Tanna Furry, MD   BP 133/90 mmHg  Pulse 61  Temp(Src) 97.5 F (36.4 C)  Resp 18  Ht 5\' 8"  (1.727 m)  Wt 297 lb (134.718 kg)  BMI 45.17 kg/m2  SpO2 99%  LMP 04/20/2011 Physical Exam  Constitutional: She is oriented to person, place, and time. She appears well-developed and well-nourished. No distress.  HENT:  Head: Normocephalic.  Eyes: Conjunctivae are normal. Pupils are equal, round, and reactive to light. No scleral icterus.  Neck: Normal range of motion. Neck supple. No thyromegaly present.  Cardiovascular: Normal rate and regular rhythm.  Exam reveals no gallop and no friction rub.   No murmur heard. Pulmonary/Chest: Effort normal. No respiratory distress. She has decreased breath sounds in the right upper field, the right middle field, the right lower field, the left upper field, the left middle field and the left lower field. She has no wheezes. She has no rales.  Slight end expiratory wheeze. Globally diminished breath sounds.  Abdominal: Soft. Bowel sounds are normal. She exhibits no distension. There is no tenderness. There is no rebound.  Musculoskeletal: Normal range of motion.  Neurological: She is alert and oriented to person, place, and time.  Skin: Skin is warm and dry. No rash noted.  No edema.  Psychiatric: She has a normal mood and affect. Her behavior is normal.    ED Course  Procedures (including critical care time) Labs Review Labs Reviewed - No data to display  Imaging Review Dg Chest 2 View  03/16/2016  CLINICAL DATA:  Chest tightness, nonproductive cough EXAM: CHEST  2 VIEW COMPARISON:  09/21/2015 FINDINGS: The heart size and mediastinal contours are within normal limits. Both lungs are clear. The visualized skeletal structures are unremarkable. Lower cervical  fusion hardware noted. Trachea is midline. IMPRESSION: No active cardiopulmonary disease. Electronically Signed   By: Jerilynn Mages.  Shick M.D.   On: 03/16/2016 08:16   I have personally reviewed and evaluated these images and lab results as part of my medical decision-making.   EKG Interpretation None      MDM   Final diagnoses:  Asthma exacerbation    Plan will be nebulized up ago, by mouth prednisone, chest x-ray, reassess.  08:40:  On reassessment, patient well oxygenated. Better aeration on exam. Plan will be home, prednisone. She has adequate solution for her nebulizer, and MDI at home.    Tanna Furry, MD 03/16/16 [phone]

## 2016-03-16 NOTE — ED Notes (Signed)
Upon assessment pt reports chest tightness related to asthma; pt reports "not chest pain; tightness because of my asthma."

## 2016-03-16 NOTE — Discharge Instructions (Signed)
Asthma, Adult Asthma is a condition of the lungs in which the airways tighten and narrow. Asthma can make it hard to breathe. Asthma cannot be cured, but medicine and lifestyle changes can help control it. Asthma may be started (triggered) by:  Animal skin flakes (dander).  Dust.  Cockroaches.  Pollen.  Mold.  Smoke.  Cleaning products.  Hair sprays or aerosol sprays.  Paint fumes or strong smells.  Cold air, weather changes, and winds.  Crying or laughing hard.  Stress.  Certain medicines or drugs.  Foods, such as dried fruit, potato chips, and sparkling grape juice.  Infections or conditions (colds, flu).  Exercise.  Certain medical conditions or diseases.  Exercise or tiring activities. HOME CARE   Take medicine as told by your doctor.  Use a peak flow meter as told by your doctor. A peak flow meter is a tool that measures how well the lungs are working.  Record and keep track of the peak flow meter's readings.  Understand and use the asthma action plan. An asthma action plan is a written plan for taking care of your asthma and treating your attacks.  To help prevent asthma attacks:  Do not smoke. Stay away from secondhand smoke.  Change your heating and air conditioning filter often.  Limit your use of fireplaces and wood stoves.  Get rid of pests (such as roaches and mice) and their droppings.  Throw away plants if you see mold on them.  Clean your floors. Dust regularly. Use cleaning products that do not smell.  Have someone vacuum when you are not home. Use a vacuum cleaner with a HEPA filter if possible.  Replace carpet with wood, tile, or vinyl flooring. Carpet can trap animal skin flakes and dust.  Use allergy-proof pillows, mattress covers, and box spring covers.  Wash bed sheets and blankets every week in hot water and dry them in a dryer.  Use blankets that are made of polyester or cotton.  Clean bathrooms and kitchens with bleach.  If possible, have someone repaint the walls in these rooms with mold-resistant paint. Keep out of the rooms that are being cleaned and painted.  Wash hands often. GET HELP IF:  You have make a whistling sound when breaking (wheeze), have shortness of breath, or have a cough even if taking medicine to prevent attacks.  The colored mucus you cough up (sputum) is thicker than usual.  The colored mucus you cough up changes from clear or white to yellow, green, gray, or bloody.  You have problems from the medicine you are taking such as:  A rash.  Itching.  Swelling.  Trouble breathing.  You need reliever medicines more than 2-3 times a week.  Your peak flow measurement is still at 50-79% of your personal best after following the action plan for 1 hour.  You have a fever. GET HELP RIGHT AWAY IF:   You seem to be worse and are not responding to medicine during an asthma attack.  You are short of breath even at rest.  You get short of breath when doing very little activity.  You have trouble eating, drinking, or talking.  You have chest pain.  You have a fast heartbeat.  Your lips or fingernails start to turn blue.  You are light-headed, dizzy, or faint.  Your peak flow is less than 50% of your personal best.   This information is not intended to replace advice given to you by your health care provider. Make sure   you discuss any questions you have with your health care provider.   Document Released: 05/20/2008 Document Revised: 08/23/2015 Document Reviewed: 07/01/2013 Elsevier Interactive Patient Education 2016 [employer]  

## 2016-03-16 NOTE — ED Notes (Signed)
Pt complains of chest tightness since 2am, she states that she has asthma and did her inhaler and a treatment with no relief, she has had a nonproductive cough also

## 2016-04-01 ENCOUNTER — Ambulatory Visit (AMBULATORY_SURGERY_CENTER): Payer: Self-pay | Admitting: *Deleted

## 2016-04-01 VITALS — Ht 68.0 in | Wt 312.0 lb

## 2016-04-01 DIAGNOSIS — Z1211 Encounter for screening for malignant neoplasm of colon: Secondary | ICD-10-CM

## 2016-04-01 MED ORDER — NA SULFATE-K SULFATE-MG SULF 17.5-3.13-1.6 GM/177ML PO SOLN: ORAL | Status: DC

## 2016-04-01 NOTE — Progress Notes (Signed)
Patient denies any allergies to eggs or soy. Patient denies any problems with anesthesia/sedation. Patient denies any oxygen use at home. Patient does take diet/weight loss medication Phentermine, patient is aware to stop this 10 days prior to colonoscopy . Patient was late for her pre-visit appointment and she did not have a medications list with her. I did review with her med. List in her chart.

## 2016-04-02 ENCOUNTER — Encounter: Payer: Medicare Other | Admitting: Gastroenterology

## 2016-04-09 ENCOUNTER — Encounter: Payer: Medicare Other | Admitting: Gastroenterology

## 2016-04-19 IMAGING — CR DG CHEST 2V
2 series · 2 of 2 positions shown · non-contrast
Comparison: Chest radiograph performed 12/12/2014

CLINICAL DATA: Acute onset of shortness of breath. Initial
encounter.

EXAM:
CHEST  2 VIEW

[w chest pa]
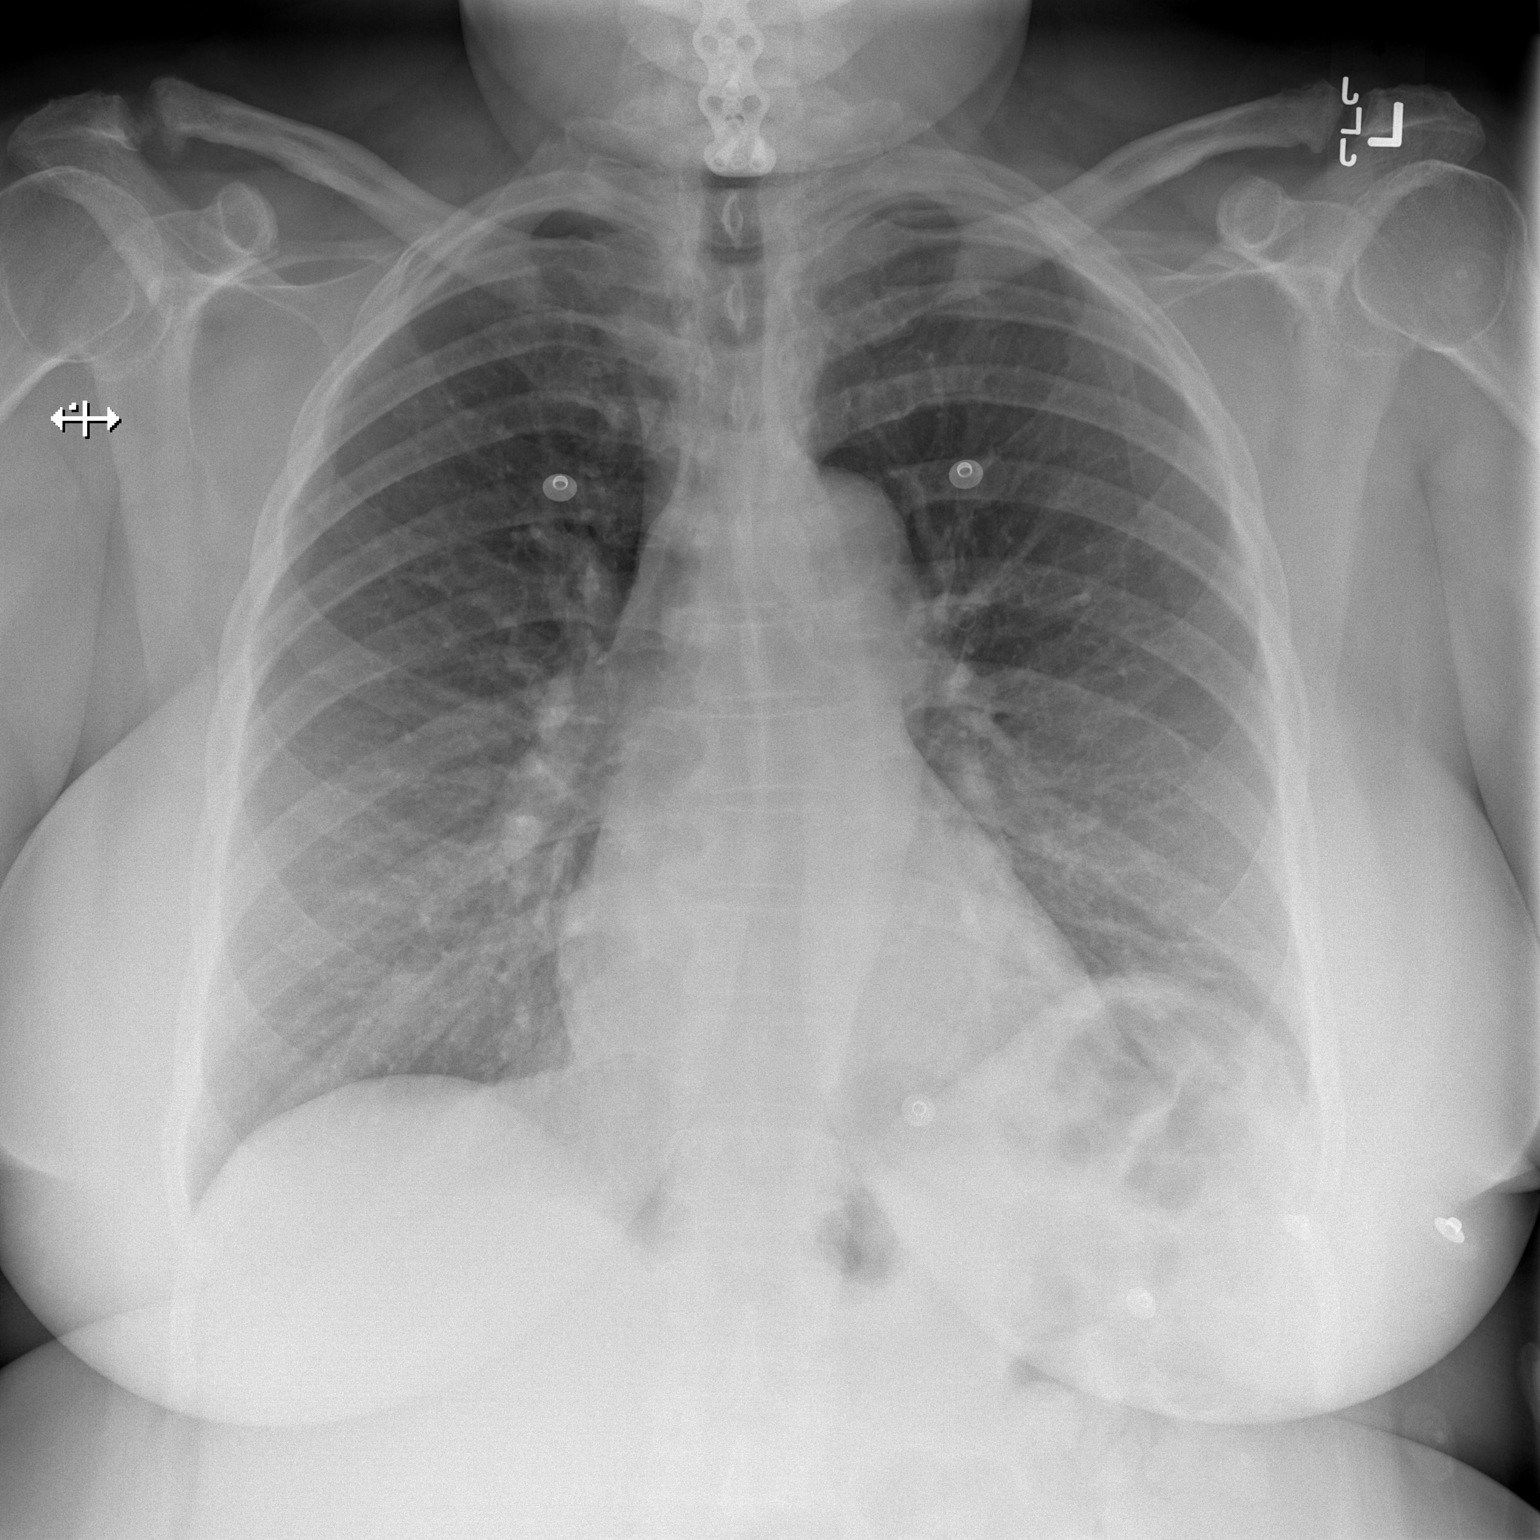

[w chest lat]
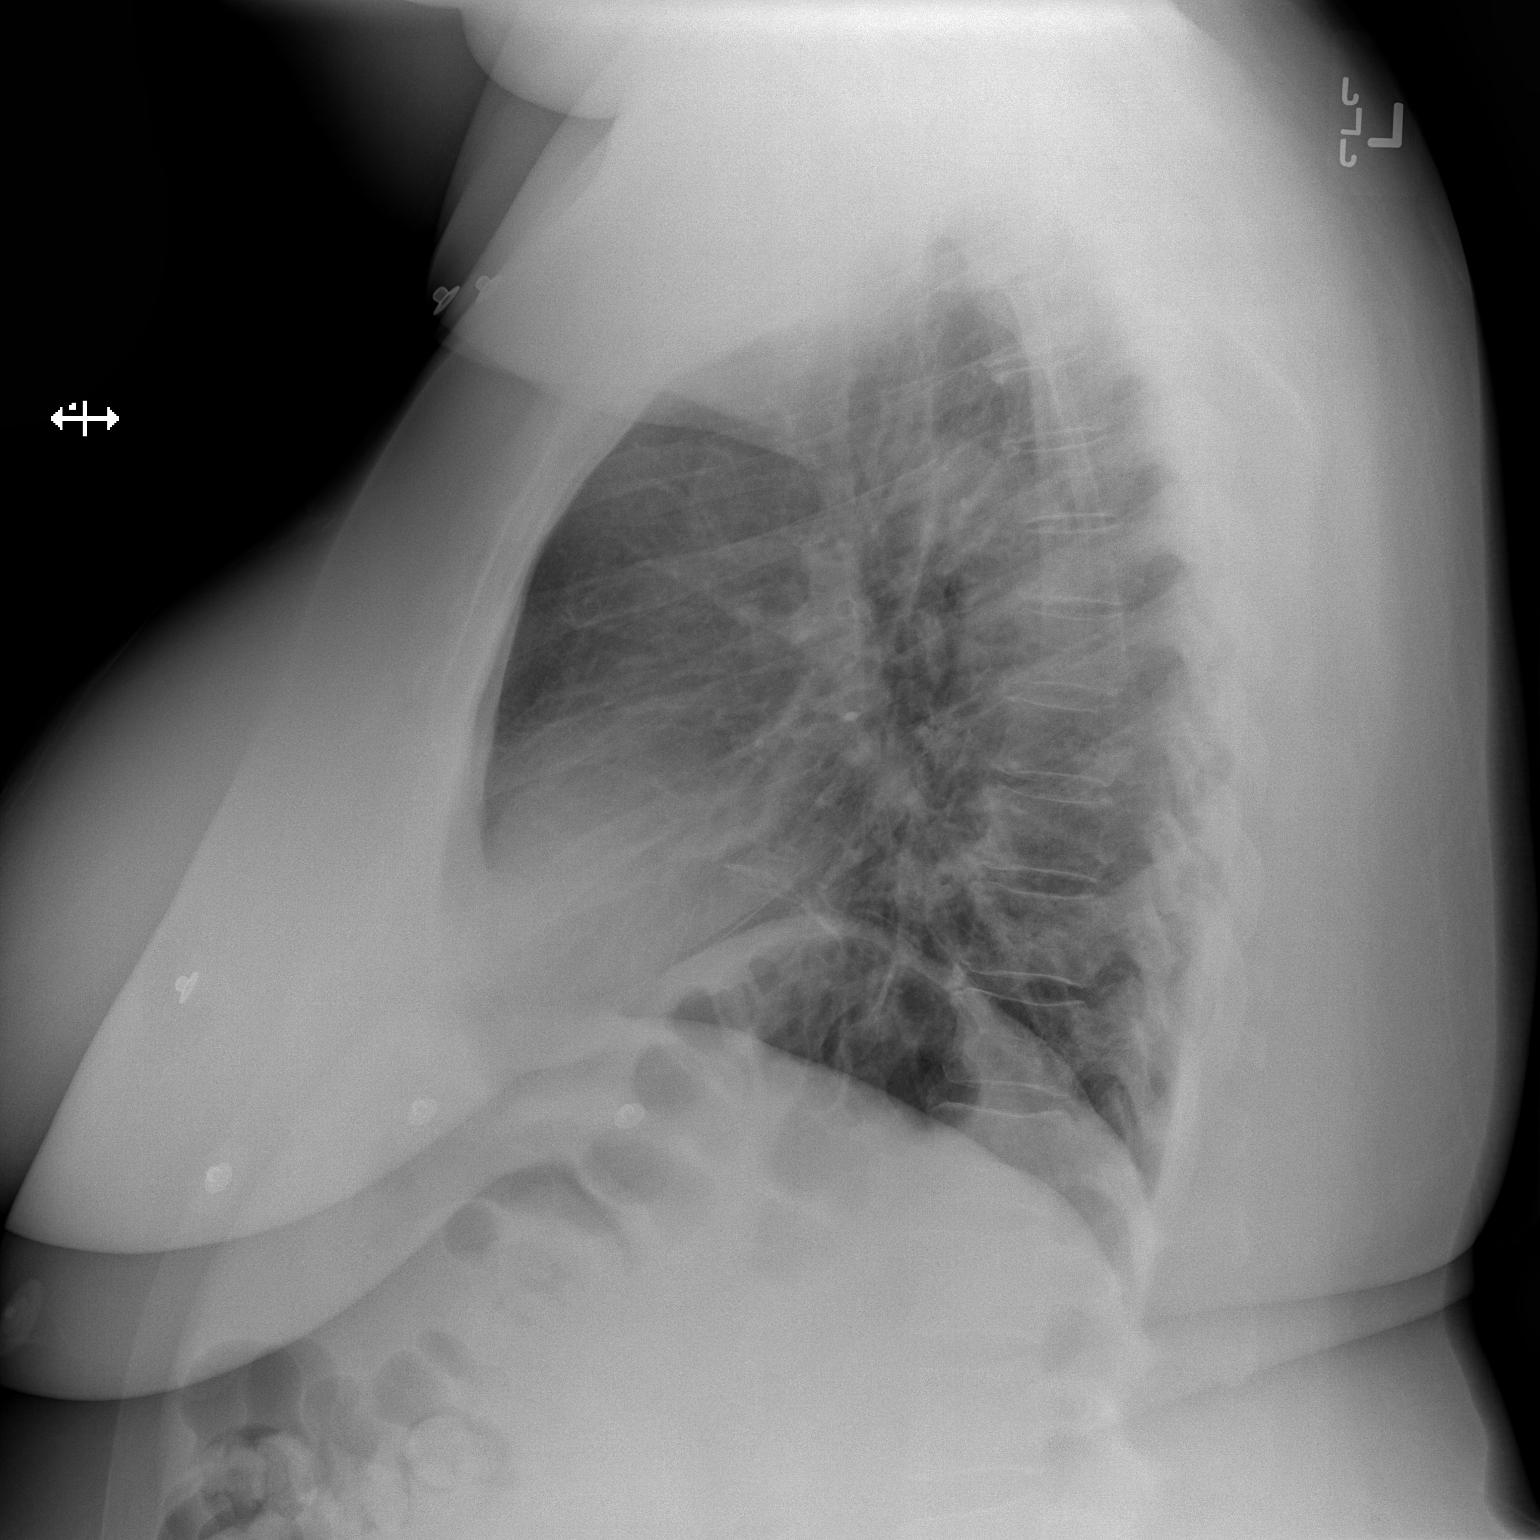

[2 of 2 positions shown; findings below may reference images not displayed]

FINDINGS: The lungs are well-aerated. There is mild elevation of the left
hemidiaphragm. Mild vascular congestion is noted. There is no
evidence of focal opacification, pleural effusion or pneumothorax.

The heart is normal in size; the mediastinal contour is within
normal limits. No acute osseous abnormalities are seen. Cervical
spinal fusion hardware is noted.
IMPRESSION: Mild vascular congestion noted. Lungs remain grossly clear. Mild
elevation of the left hemidiaphragm.

## 2016-04-19 IMAGING — CT CT ANGIO CHEST
2 of 6 series · 18 of 36 positions shown · IV contrast ([ID] OMNI 350)
Comparison: Chest radiographs 1731 hr today and earlier.

CLINICAL DATA: 54-year-old female with acute onset chest pain and
shortness of breath that will curb. Initial encounter.

EXAM:
CT ANGIOGRAPHY CHEST WITH CONTRAST
TECHNIQUE: Multidetector CT imaging of the chest was performed using the
standard protocol during bolus administration of intravenous
contrast. Multiplanar CT image reconstructions and MIPs were
obtained to evaluate the vascular anatomy.
CONTRAST:  100mL OMNIPAQUE IOHEXOL 350 MG/ML SOLN

[Series 7: thins for pacs · axial · 0.74mm/px · z∈[-215,-9]mm · 17 of 230 slices shown]
[im 12/230  lung]
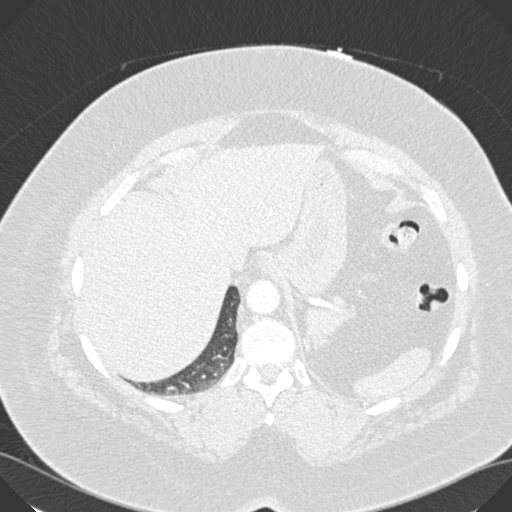
[im 23/230  mediastinal]
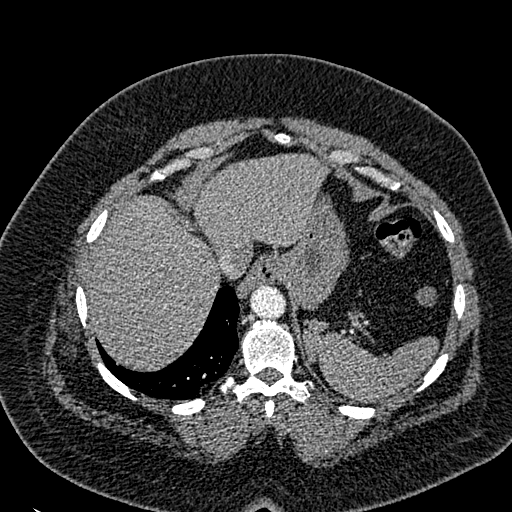
[im 35/230  lung]
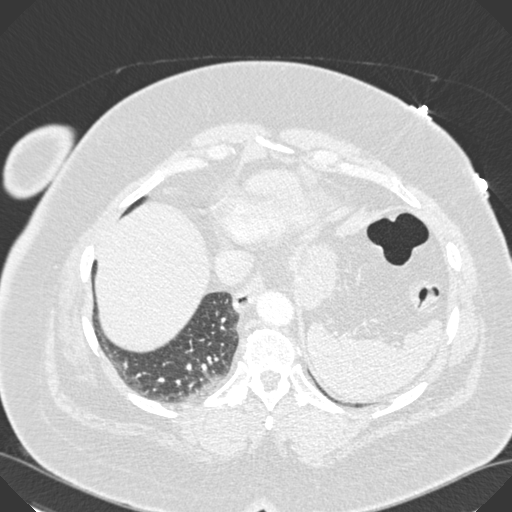
[im 46/230  mediastinal]
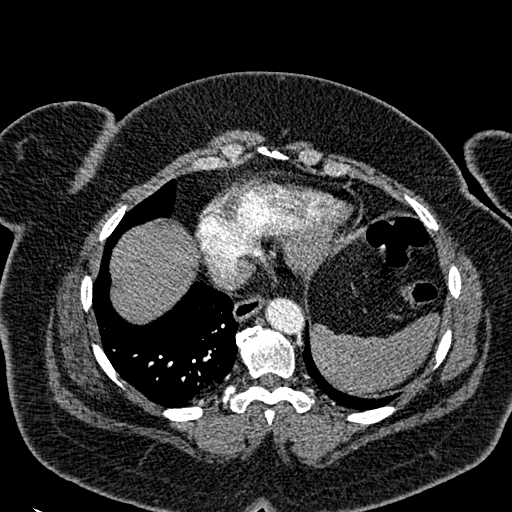
[im 69/230  lung]
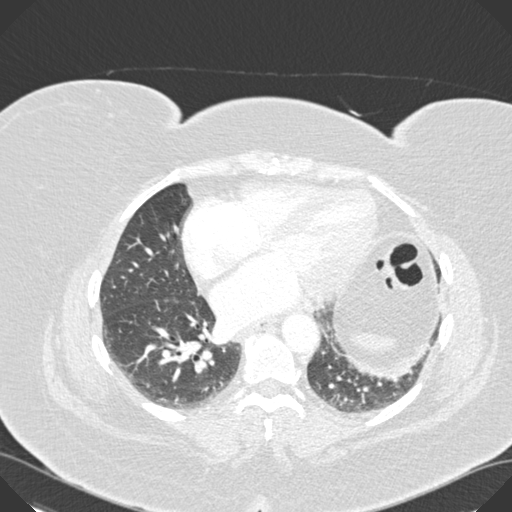
[im 81/230  mediastinal]
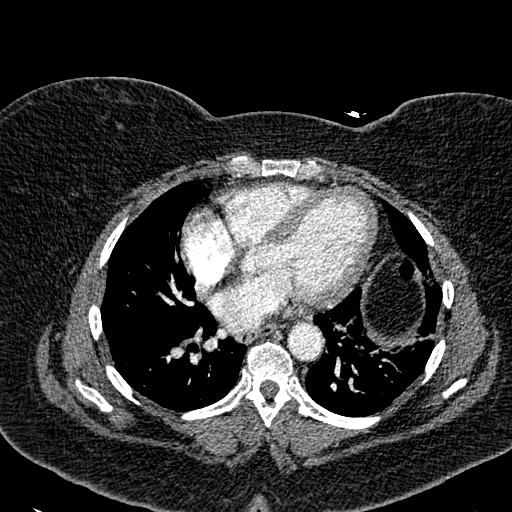
[im 92/230  lung]
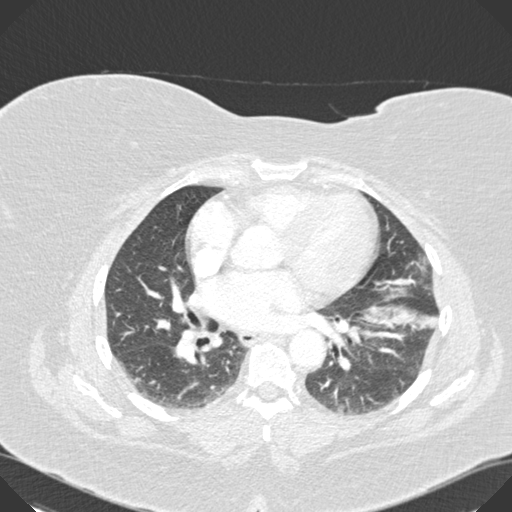
[im 104/230  mediastinal]
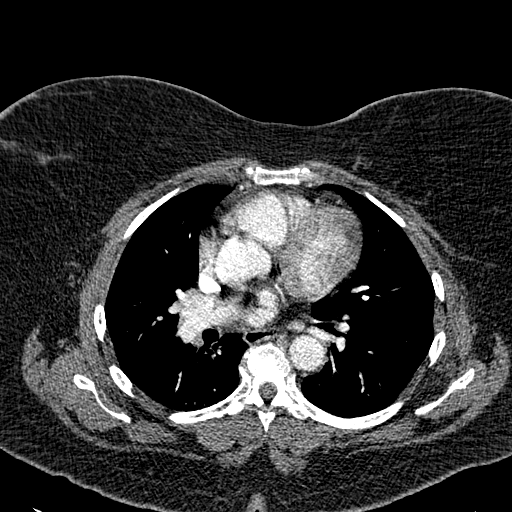
[im 115/230  lung]
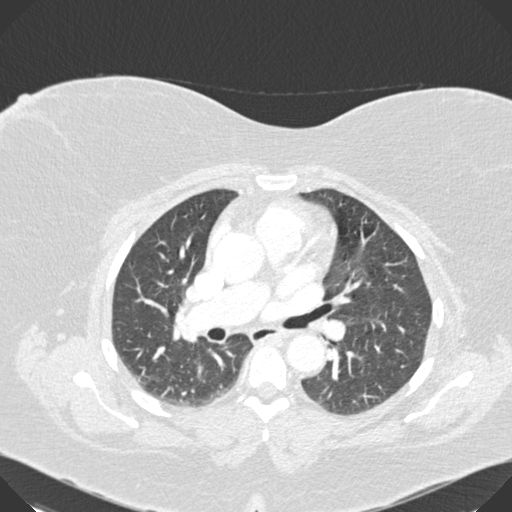
[im 126/230  mediastinal]
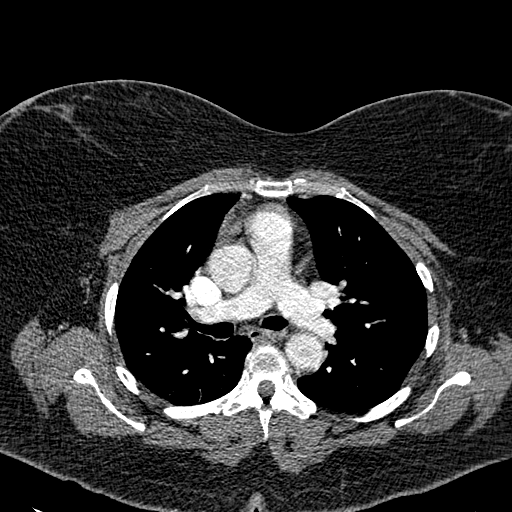
[im 138/230  lung]
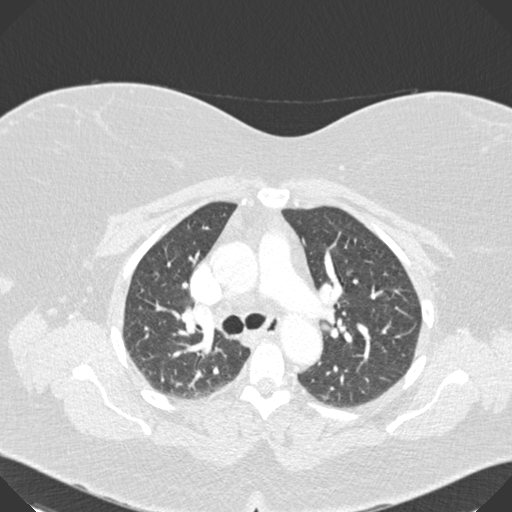
[im 149/230  mediastinal]
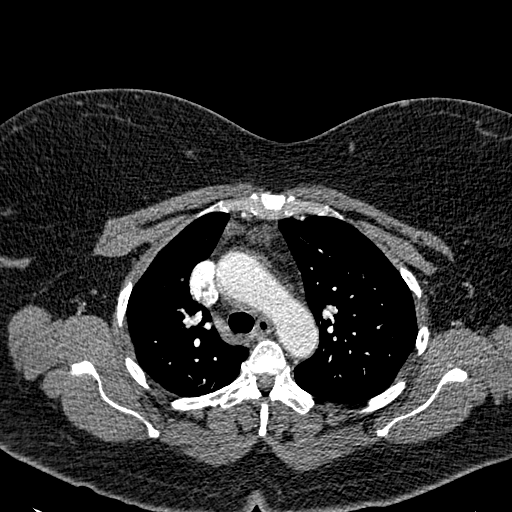
[im 161/230  lung]
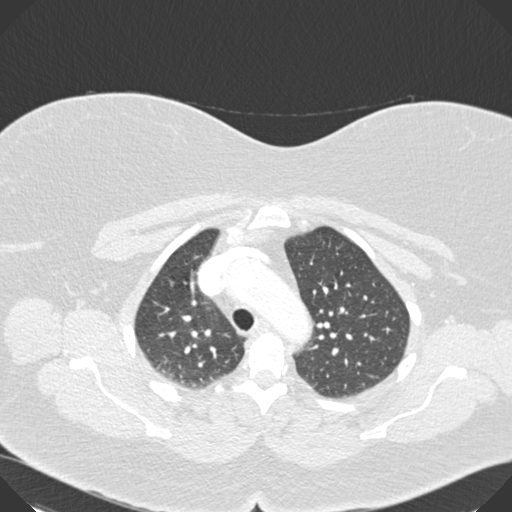
[im 184/230  mediastinal]
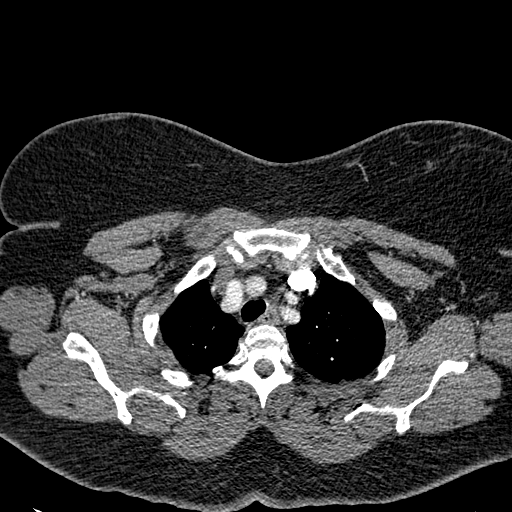
[im 195/230  lung]
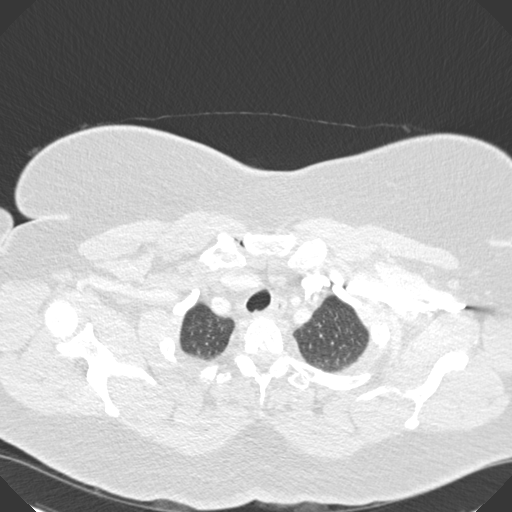
[im 207/230  mediastinal]
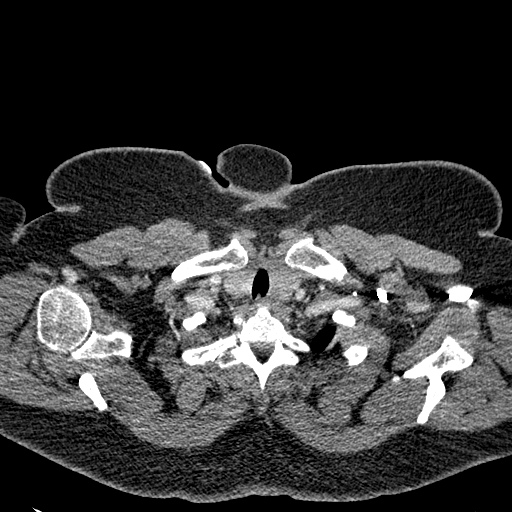
[im 218/230  lung]
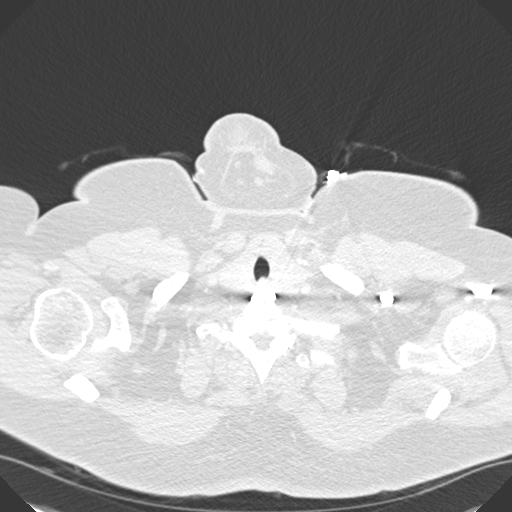

[Series 9: coronal mpr · coronal · 0.45mm/px · 1 of 133 slices shown]
[im 67/133  mediastinal]
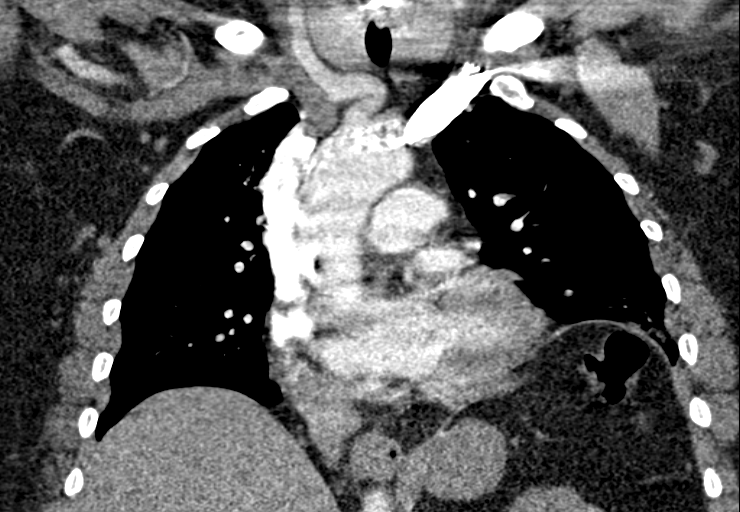

[18 of 36 positions shown; findings below may reference images not displayed]

FINDINGS: Suboptimal contrast bolus timing in the pulmonary arterial tree.
Mild respiratory motion artifacts superimposed. No central or hilar
pulmonary artery filling defect. No definite more distal pulmonary
artery filling defect.

Large body habitus. No pericardial or pleural effusion. Negative
visualized aorta. Tortuous proximal great vessels. No mediastinal
lymphadenopathy. No axillary or thoracic inlet lymphadenopathy.

Major airways are patent. Elevated left hemidiaphragm. Platelike
opacity in the left lower lobe. Superimposed dependent patchy in
ground-glass opacity in both lungs. No other abnormal pulmonary
opacity.

Negative visualized upper abdominal viscera.

No acute osseous abnormality identified.

Review of the MIP images confirms the above findings.
IMPRESSION: 1. Suboptimal contrast bolus timing in the pulmonary arteries with
no definite acute pulmonary embolus.
2. Pulmonary atelectasis greater on the left.

## 2016-04-22 ENCOUNTER — Telehealth: Payer: Self-pay | Admitting: Gastroenterology

## 2016-04-23 ENCOUNTER — Encounter: Payer: Medicare Other | Admitting: Gastroenterology

## 2016-05-01 ENCOUNTER — Telehealth: Payer: Self-pay | Admitting: Gastroenterology

## 2016-05-02 NOTE — Telephone Encounter (Signed)
No charge. 

## 2016-05-03 ENCOUNTER — Encounter: Payer: Medicare Other | Admitting: Gastroenterology

## 2016-05-09 ENCOUNTER — Encounter: Payer: Self-pay | Admitting: Gastroenterology

## 2016-05-09 ENCOUNTER — Ambulatory Visit (AMBULATORY_SURGERY_CENTER): Payer: Medicare Other | Admitting: Gastroenterology

## 2016-05-09 ENCOUNTER — Encounter: Payer: Medicare Other | Admitting: Gastroenterology

## 2016-05-09 VITALS — BP 146/85 | HR 53 | Temp 98.4°F | Resp 11 | Ht 68.0 in | Wt 312.0 lb

## 2016-05-09 DIAGNOSIS — Z800 Family history of malignant neoplasm of digestive organs: Secondary | ICD-10-CM

## 2016-05-09 DIAGNOSIS — Z1211 Encounter for screening for malignant neoplasm of colon: Secondary | ICD-10-CM | POA: Diagnosis not present

## 2016-05-09 MED ORDER — SODIUM CHLORIDE 0.9 % IV SOLN: 500.0000 mL | INTRAVENOUS | Status: DC

## 2016-05-09 NOTE — Patient Instructions (Signed)
YOU HAD AN ENDOSCOPIC PROCEDURE TODAY AT [facility]:   Refer to the procedure report that was given to you for any specific questions about what was found during the examination.  If the procedure report does not answer your questions, please call your gastroenterologist to clarify.  If you requested that your care partner not be given the details of your procedure findings, then the procedure report has been included in a sealed envelope for you to review at your convenience later.  YOU SHOULD EXPECT: Some feelings of bloating in the abdomen. Passage of more gas than usual.  Walking can help get rid of the air that was put into your GI tract during the procedure and reduce the bloating. If you had a lower endoscopy (such as a colonoscopy or flexible sigmoidoscopy) you may notice spotting of blood in your stool or on the toilet paper. If you underwent a bowel prep for your procedure, you may not have a normal bowel movement for a few days.  Please Note:  You might notice some irritation and congestion in your nose or some drainage.  This is from the oxygen used during your procedure.  There is no need for concern and it should clear up in a day or so.  SYMPTOMS TO REPORT IMMEDIATELY:   Following lower endoscopy (colonoscopy or flexible sigmoidoscopy):  Excessive amounts of blood in the stool  Significant tenderness or worsening of abdominal pains  Swelling of the abdomen that is new, acute  Fever of 100F or higher   For urgent or emergent issues, a gastroenterologist can be reached at any hour by calling [phone].   DIET: Your first meal following the procedure should be a small meal and then it is ok to progress to your normal diet. Heavy or fried foods are harder to digest and may make you feel nauseous or bloated.  Likewise, meals heavy in dairy and vegetables can increase bloating.  Drink plenty of fluids but you should avoid alcoholic beverages for 24  hours.  ACTIVITY:  You should plan to take it easy for the rest of today and you should NOT DRIVE or use heavy machinery until tomorrow (because of the sedation medicines used during the test).    FOLLOW UP: Our staff will call the number listed on your records the next business day following your procedure to check on you and address any questions or concerns that you may have regarding the information given to you following your procedure. If we do not reach you, we will leave a message.  However, if you are feeling well and you are not experiencing any problems, there is no need to return our call.  We will assume that you have returned to your regular daily activities without incident.  If any biopsies were taken you will be contacted by phone or by letter within the next 1-3 weeks.  Please call us at [phone] if you have not heard about the biopsies in 3 weeks.    SIGNATURES/CONFIDENTIALITY: You and/or your care partner have signed paperwork which will be entered into your electronic medical record.  These signatures attest to the fact that that the information above on your After Visit Summary has been reviewed and is understood.  Full responsibility of the confidentiality of this discharge information lies with you and/or your care-partner.  Read all handouts given to you by your recovery room nurse.   Thank-you for choosing [name] for your healthcare needs today.

## 2016-05-09 NOTE — Progress Notes (Signed)
A and Ox 3 Report to RN 

## 2016-05-09 NOTE — Op Note (Signed)
[facility] Patient Name: [name] Procedure Date: 05/09/2016 10:25 AM MRN: [id number] Endoscopist: Mauri Pole , MD Age: 55 Referring MD:  Date of Birth: 10-08-1961 Gender: Female Procedure:                Colonoscopy Indications:              Screening for colorectal malignant neoplasm,                            Screening in patient at increased risk: Colorectal                            cancer in mother before age 21, This is the                            patient's first colonoscopy Medicines:                Monitored Anesthesia Care Procedure:                Pre-Anesthesia Assessment:                           - Prior to the procedure, a History and Physical                            was performed, and patient medications and                            allergies were reviewed. The patient's tolerance of                            previous anesthesia was also reviewed. The risks                            and benefits of the procedure and the sedation                            options and risks were discussed with the patient.                            All questions were answered, and informed consent                            was obtained. Prior Anticoagulants: The patient has                            taken no previous anticoagulant or antiplatelet                            agents. ASA Grade Assessment: II - A patient with                            mild systemic disease. After reviewing the risks  and benefits, the patient was deemed in                            satisfactory condition to undergo the procedure.                           After obtaining informed consent, the colonoscope                            was passed under direct vision. Throughout the                            procedure, the patient's blood pressure, pulse, and                            oxygen saturations were monitored continuously. The                      Model CF-HQ190L [id number]) scope was introduced                            through the anus and advanced to the the cecum,                            identified by appendiceal orifice and ileocecal                            valve. The colonoscopy was performed without                            difficulty. The patient tolerated the procedure                            well. The quality of the bowel preparation was                            good. The ileocecal valve, appendiceal orifice, and                            rectum were photographed. Scope In: 10:33:41 AM Scope Out: 10:44:29 AM Scope Withdrawal Time: 0 hours 6 minutes 27 seconds  Total Procedure Duration: 0 hours 10 minutes 48 seconds  Findings:                 The perianal and digital rectal examinations were                            normal.                           The entire examined colon appeared normal. Complications:            No immediate complications. Estimated Blood Loss:     Estimated blood loss: none. Impression:               - The entire examined colon is normal.                           -  No specimens collected. Recommendation:           - Patient has a contact number available for                            emergencies. The signs and symptoms of potential                            delayed complications were discussed with the                            patient. Return to normal activities tomorrow.                            Written discharge instructions were provided to the                            patient.                           - Resume previous diet.                           - Continue present medications.                           - Repeat colonoscopy in 5 years for screening                            purposes. Mauri Pole, MD 05/09/2016 10:48:33 AM This report has been signed electronically.

## 2016-05-10 ENCOUNTER — Telehealth: Payer: Self-pay

## 2016-05-10 NOTE — Telephone Encounter (Signed)
  Follow up Call-  Call back number 05/09/2016  Post procedure Call Back phone  # [phone]  Permission to leave phone message Yes     Patient questions:  Do you have a fever, pain , or abdominal swelling? No. Pain Score  0 *  Have you tolerated food without any problems? Yes.    Have you been able to return to your normal activities? Yes.    Do you have any questions about your discharge instructions: Diet   No. Medications  No. Follow up visit  Yes.    Do you have questions or concerns about your Care? No.  Actions: * If pain score is 4 or above: No action needed, pain <4.

## 2016-09-06 ENCOUNTER — Emergency Department (HOSPITAL_COMMUNITY)
Admission: EM | Admit: 2016-09-06 | Discharge: 2016-09-06 | Disposition: A | Discharge status: Home / Self Care | Payer: No Typology Code available for payment source | Attending: Emergency Medicine | Admitting: Emergency Medicine

## 2016-09-06 ENCOUNTER — Encounter (HOSPITAL_COMMUNITY): Payer: Self-pay | Admitting: Emergency Medicine

## 2016-09-06 DIAGNOSIS — Z79899 Other long term (current) drug therapy: Secondary | ICD-10-CM | POA: Insufficient documentation

## 2016-09-06 DIAGNOSIS — J45909 Unspecified asthma, uncomplicated: Secondary | ICD-10-CM | POA: Insufficient documentation

## 2016-09-06 DIAGNOSIS — Y939 Activity, unspecified: Secondary | ICD-10-CM | POA: Diagnosis not present

## 2016-09-06 DIAGNOSIS — Y999 Unspecified external cause status: Secondary | ICD-10-CM | POA: Diagnosis not present

## 2016-09-06 DIAGNOSIS — I10 Essential (primary) hypertension: Secondary | ICD-10-CM | POA: Diagnosis not present

## 2016-09-06 DIAGNOSIS — Y92410 Unspecified street and highway as the place of occurrence of the external cause: Secondary | ICD-10-CM | POA: Insufficient documentation

## 2016-09-06 DIAGNOSIS — S3992XA Unspecified injury of lower back, initial encounter: Secondary | ICD-10-CM | POA: Insufficient documentation

## 2016-09-06 MED ORDER — IBUPROFEN 400 MG PO TABS
600.0000 mg | ORAL_TABLET | Freq: Once | ORAL | Status: AC
  Administered 2016-09-06: 600 mg via ORAL
  Filled 2016-09-06: qty 1
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
## 2016-09-06 NOTE — Discharge Instructions (Signed)
Continue taking your home medications as prescribed. Follow up with your primary care provider in 1 week if your symptoms have not improved. Please return to the Emergency Department if symptoms worsen or new onset of fever, numbness, tingling, saddle anesthesia, loss of bowel or bladder, weakness, chest pain, abdominal pain, urinary symptoms.
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
## 2016-12-05 ENCOUNTER — Ambulatory Visit (INDEPENDENT_AMBULATORY_CARE_PROVIDER_SITE_OTHER): Payer: Medicare Other

## 2016-12-05 ENCOUNTER — Ambulatory Visit (INDEPENDENT_AMBULATORY_CARE_PROVIDER_SITE_OTHER): Payer: Self-pay

## 2016-12-05 ENCOUNTER — Ambulatory Visit (INDEPENDENT_AMBULATORY_CARE_PROVIDER_SITE_OTHER): Payer: Medicare Other | Admitting: Orthopaedic Surgery

## 2016-12-05 DIAGNOSIS — M25561 Pain in right knee: Secondary | ICD-10-CM | POA: Diagnosis not present

## 2016-12-05 DIAGNOSIS — M1712 Unilateral primary osteoarthritis, left knee: Secondary | ICD-10-CM | POA: Insufficient documentation

## 2016-12-05 DIAGNOSIS — G8929 Other chronic pain: Secondary | ICD-10-CM | POA: Diagnosis not present

## 2016-12-05 DIAGNOSIS — M1711 Unilateral primary osteoarthritis, right knee: Secondary | ICD-10-CM

## 2016-12-05 DIAGNOSIS — M5442 Lumbago with sciatica, left side: Secondary | ICD-10-CM

## 2016-12-05 DIAGNOSIS — M25562 Pain in left knee: Secondary | ICD-10-CM

## 2016-12-05 NOTE — Progress Notes (Signed)
Office Visit Note   Patient: [name]           Date of Birth: Jan 27, 1961           MRN: [id number] Visit Date: 12/05/2016              Requested by: Nolene Ebbs, MD [address] [place], [place] [address] PCP: Philis Fendt, MD   Assessment & Plan: Visit Diagnoses:  1. Chronic pain of right knee   2. Chronic pain of left knee   3. Low back pain with left-sided sciatica, unspecified back pain laterality, unspecified chronicity   4. Unilateral primary osteoarthritis, right knee   5. Unilateral primary osteoarthritis, left knee     Plan: I would see her worst problem right now is her left knee and she agrees with this. There is really no good options at this point for her other than knee replacement surgery. There is no role for injections at this point given the fact that her medial joint space is completely gone and she has extensive degenerative joint disease throughout her left knee. I showed her a knee model as well as a knee replacement model and described in detail with the surgery involves. She understands fully that her risks of infection, blood loss, nerve and vessel injury, and DVT as well as malalignment implants are all heightened given her obesity. Her soft tissue envelope around her knee is not significantly extensive that it makes me uncomfortable with proceeding with surgery. I do feel that she would benefit from a knee replacement surgery. She is not a diabetic and that works in her favor. After thorough and long discussion about surgery she understands our goals are to be decreased pain, improve mobility, and overall improved quality of life. She does wish proceed with the surgery in the near future. We will be in touch about having this scheduled.  Follow-Up Instructions: Return if symptoms worsen or fail to improve.   Orders:  Orders Placed This Encounter  Procedures  . XR Knee 1-2 Views Right  . XR Knee 1-2 Views Left  . XR Lumbar Spine 2-3 Views    No orders of the defined types were placed in this encounter.     Procedures: No procedures performed   Clinical Data: No additional findings.   Subjective: No chief complaint on file. The patient is a very pleasant 55 year old female who is morbidly obese and has chief complaint of bilateral knee pain with left worse than the right as well as low back pain. She is been told she has "bone on bone wear" of her left knee. She weighs over 300 pounds. She is been to several seminars for bariatric surgery in counseling but decided against this for now. She says she is just afraid of that type of surgery. Her left knee pain is her worst pain. It is detrimentally affected her activities daily living, her quality of life, and her mobility. She is actually on disability for other reasons. She does get locking and catching in her left knee. It hurts with mobility. It is 10 out of 10 in terms of pain. Is been going on for over 5 years now.  HPI  Review of Systems She currently denies any headache, chest pain, shortness of breath, fever, chills, nausea, vomiting.  Objective: Vital Signs: LMP 04/20/2011   Physical Exam She is alert and oriented 3. She's very pleasant to talk with gets up on the exam table easily. Ortho Exam Examination of  both her knees show each knee has a slight varus deformity. Both knees have excellent flexion-extension and are both ligamentously stable. Both knees have significant patellofemoral crepitation as well as medial and lateral joint line tenderness. She has good flexion-extension of lumbar spine but it is painful to her. Specialty Comments:  No specialty comments available.  Imaging: Xr Knee 1-2 Views Left  Result Date: 12/05/2016 An AP and lateral of her left knee show severe tricompartmental arthritis. There is complete loss of the medial joint space, there is particular osteophytes, there severe sclerotic changes. This is throughout the entire knee. There  is a slight varus malalignment as well.  Xr Knee 1-2 Views Right  Result Date: 12/05/2016 AP and lateral of the right knee shows tricompartmental arthritic changes. This is throughout the knee mostly involving the medial joint space. There is slight varus deformity. There is para-articular osteophytes of the knee and sclerotic changes.  Xr Lumbar Spine 2-3 Views  Result Date: 12/05/2016 An AP and lateral of the lumbar spine shows decently maintained disc height and alignment. There is no acute findings.    PMFS History: Patient Active Problem List   Diagnosis Date Noted  . Unilateral primary osteoarthritis, left knee 12/05/2016  . Degenerative disc disease, cervical    Past Medical History:  Diagnosis Date  . Asthma   . Degenerative disc disease   . Degenerative disc disease, cervical   . Degenerative disc disease, cervical   . Hypertension     Family History  Problem Relation Age of Onset  . Cancer Mother   . Diabetes Father   . Heart disease Father   . Cancer Sister     Past Surgical History:  Procedure Laterality Date  . BACK SURGERY    . NOVASURE ABLATION    . TUBAL LIGATION     Social History   Occupational History  . Not on file.   Social History Main Topics  . Smoking status: Never Smoker  . Smokeless tobacco: Not on file  . Alcohol use Yes     Comment: occasional  . Drug use: No  . Sexual activity: Yes    Birth control/ protection: Surgical

## 2016-12-24 ENCOUNTER — Telehealth (INDEPENDENT_AMBULATORY_CARE_PROVIDER_SITE_OTHER): Payer: Self-pay | Admitting: Orthopaedic Surgery

## 2016-12-24 NOTE — Telephone Encounter (Signed)
I called patient and we scheduled surgery for 01/17/17.

## 2016-12-24 NOTE — Telephone Encounter (Signed)
Patient calling regarding scheduling surgery for L knee replacement .  She would like to schedule it for month of Feb or March if you can let her know what's available. Patient states it's very difficult to walk and was told this is because she has "bone on bone".  Patient is disabled...has medicare and medicaid.

## 2017-01-08 ENCOUNTER — Other Ambulatory Visit (HOSPITAL_COMMUNITY): Payer: Self-pay | Admitting: *Deleted

## 2017-01-08 ENCOUNTER — Other Ambulatory Visit (INDEPENDENT_AMBULATORY_CARE_PROVIDER_SITE_OTHER): Payer: Self-pay | Admitting: Orthopaedic Surgery

## 2017-01-08 DIAGNOSIS — M1712 Unilateral primary osteoarthritis, left knee: Secondary | ICD-10-CM

## 2017-01-08 NOTE — Progress Notes (Unsigned)
CHEST XRAY 03-16-16 EPIC  EKG 03-16-16 EPIC

## 2017-01-08 NOTE — Progress Notes (Signed)
HAS PRE OP TOMORROW----NEED ORDERS PLEASE IN EPIC thanks

## 2017-01-08 NOTE — Patient Instructions (Addendum)
ANJELICA BERRES  01/08/2017   Your procedure is scheduled on: 01-17-17  Report to [facility]  Entrance take [facility]  elevators to 3rd floor to  [place] at 1215 PM  Call this number if you have problems the morning of surgery [phone]   Remember: ONLY 1 PERSON MAY GO WITH YOU TO SHORT STAY TO GET  READY MORNING OF YOUR SURGERY.    Do not eat food  :After Midnight, CLEAR LIQUIDS FROM MIDNIGHT UNTIL 815 AM-THEN NOTHING BY MOUTH AFTER 815 AM  DAY OF SURGERY.    Take these medicines the morning of surgery with A SIP OF WATER: ALBUTEROL INHALER IF NEEDED AND BRING INHALER WITH YOU, ALBUTEROL NEBULIZER IF NEEDED, ATENOLOL (TENORMIN),OXYCODONE HCL IF NEEDED, QVAR INHALER, Amlodipine(Norvasc)                               You may not have any metal on your body including hair pins and              piercings  Do not wear jewelry, make-up, lotions, powders or perfumes, deodorant             Do not wear nail polish.  Do not shave  48 hours prior to surgery.              Men may shave face and neck.   Do not bring valuables to the hospital. [place].  Contacts, dentures or bridgework may not be worn into surgery.  Leave suitcase in the car. After surgery it may be brought to your room.     _____________________________________________________________________                CLEAR LIQUID DIET   Foods Allowed                                                                     Foods Excluded  Coffee and tea, regular and decaf                             liquids that you cannot  Plain Jell-O in any flavor                                             see through such as: Fruit ices (not with fruit pulp)                                     milk, soups, orange juice  Iced Popsicles                                    All solid food Carbonated beverages, regular and diet  Cranberry, grape and apple juices Sports drinks like Gatorade Lightly seasoned clear broth or consume(fat free) Sugar, honey syrup  Sample Menu Breakfast                                Lunch                                     Supper Cranberry juice                    Beef broth                            Chicken broth Jell-O                                     Grape juice                           Apple juice Coffee or tea                        Jell-O                                      Popsicle                                                Coffee or tea                        Coffee or tea  _____________________________________________________________________  [facility] - Preparing for Surgery Before surgery, you can play an important role.  Because skin is not sterile, your skin needs to be as free of germs as possible.  You can reduce the number of germs on your skin by washing with CHG (chlorahexidine gluconate) soap before surgery.  CHG is an antiseptic cleaner which kills germs and bonds with the skin to continue killing germs even after washing. Please DO NOT use if you have an allergy to CHG or antibacterial soaps.  If your skin becomes reddened/irritated stop using the CHG and inform your nurse when you arrive at Short Stay. Do not shave (including legs and underarms) for at least 48 hours prior to the first CHG shower.  You may shave your face/neck. Please follow these instructions carefully:  1.  Shower with CHG Soap the night before surgery and the  morning of Surgery.  2.  If you choose to wash your hair, wash your hair first as usual with your  normal  shampoo.  3.  After you shampoo, rinse your hair and body thoroughly to remove the  shampoo.                           4.  Use CHG as you would any other liquid soap.  You can apply chg directly  to the skin and wash  Gently with a scrungie or clean washcloth.  5.  Apply the CHG Soap to your body ONLY  FROM THE NECK DOWN.   Do not use on face/ open                           Wound or open sores. Avoid contact with eyes, ears mouth and genitals (private parts).                       Wash face,  Genitals (private parts) with your normal soap.             6.  Wash thoroughly, paying special attention to the area where your surgery  will be performed.  7.  Thoroughly rinse your body with warm water from the neck down.  8.  DO NOT shower/wash with your normal soap after using and rinsing off  the CHG Soap.                9.  Pat yourself dry with a clean towel.            10.  Wear clean pajamas.            11.  Place clean sheets on your bed the night of your first shower and do not  sleep with pets. Day of Surgery : Do not apply any lotions/deodorants the morning of surgery.  Please wear clean clothes to the hospital/surgery center.  FAILURE TO FOLLOW THESE INSTRUCTIONS MAY RESULT IN THE CANCELLATION OF YOUR SURGERY PATIENT SIGNATURE_________________________________  NURSE SIGNATURE__________________________________  ________________________________________________________________________   Adam Phenix  An incentive spirometer is a tool that can help keep your lungs clear and active. This tool measures how well you are filling your lungs with each breath. Taking long deep breaths may help reverse or decrease the chance of developing breathing (pulmonary) problems (especially infection) following:  A long period of time when you are unable to move or be active. BEFORE THE PROCEDURE   If the spirometer includes an indicator to show your best effort, your nurse or respiratory therapist will set it to a desired goal.  If possible, sit up straight or lean slightly forward. Try not to slouch.  Hold the incentive spirometer in an upright position. INSTRUCTIONS FOR USE  1. Sit on the edge of your bed if possible, or sit up as far as you can in bed or on a chair. 2. Hold the incentive  spirometer in an upright position. 3. Breathe out normally. 4. Place the mouthpiece in your mouth and seal your lips tightly around it. 5. Breathe in slowly and as deeply as possible, raising the piston or the ball toward the top of the column. 6. Hold your breath for 3-5 seconds or for as long as possible. Allow the piston or ball to fall to the bottom of the column. 7. Remove the mouthpiece from your mouth and breathe out normally. 8. Rest for a few seconds and repeat Steps 1 through 7 at least 10 times every 1-2 hours when you are awake. Take your time and take a few normal breaths between deep breaths. 9. The spirometer may include an indicator to show your best effort. Use the indicator as a goal to work toward during each repetition. 10. After each set of 10 deep breaths, practice coughing to be sure your lungs are clear. If you have an incision (the cut made at the time of  surgery), support your incision when coughing by placing a pillow or rolled up towels firmly against it. Once you are able to get out of bed, walk around indoors and cough well. You may stop using the incentive spirometer when instructed by your caregiver.  RISKS AND COMPLICATIONS  Take your time so you do not get dizzy or light-headed.  If you are in pain, you may need to take or ask for pain medication before doing incentive spirometry. It is harder to take a deep breath if you are having pain. AFTER USE  Rest and breathe slowly and easily.  It can be helpful to keep track of a log of your progress. Your caregiver can provide you with a simple table to help with this. If you are using the spirometer at home, follow these instructions: [place] IF:   You are having difficultly using the spirometer.  You have trouble using the spirometer as often as instructed.  Your pain medication is not giving enough relief while using the spirometer.  You develop fever of 100.5 F (38.1 C) or higher. SEEK  IMMEDIATE MEDICAL CARE IF:   You cough up bloody sputum that had not been present before.  You develop fever of 102 F (38.9 C) or greater.  You develop worsening pain at or near the incision site. MAKE SURE YOU:   Understand these instructions.  Will watch your condition.  Will get help right away if you are not doing well or get worse. Document Released: 04/14/2007 Document Revised: 02/24/2012 Document Reviewed: 06/15/2007 [facility] Patient Information 2014 [place], [place].   ________________________________________________________________________

## 2017-01-09 ENCOUNTER — Encounter (HOSPITAL_COMMUNITY)
Admission: RE | Admit: 2017-01-09 | Discharge: 2017-01-09 | Disposition: A | Discharge status: Home / Self Care | Payer: Medicare Other | Source: Ambulatory Visit | Attending: Orthopaedic Surgery | Admitting: Orthopaedic Surgery

## 2017-01-09 ENCOUNTER — Encounter (HOSPITAL_COMMUNITY): Payer: Self-pay

## 2017-01-09 DIAGNOSIS — Z01818 Encounter for other preprocedural examination: Secondary | ICD-10-CM | POA: Diagnosis not present

## 2017-01-09 DIAGNOSIS — M1712 Unilateral primary osteoarthritis, left knee: Secondary | ICD-10-CM | POA: Diagnosis not present

## 2017-01-09 HISTORY — DX: Headache: R51

## 2017-01-09 HISTORY — DX: Sleep apnea, unspecified: G47.30

## 2017-01-09 HISTORY — DX: Headache, unspecified: R51.9

## 2017-01-09 HISTORY — DX: Anxiety disorder, unspecified: F41.9

## 2017-01-09 LAB — BASIC METABOLIC PANEL
ANION GAP: 6 (ref 5–15)
BUN: 17 mg/dL (ref 6–20)
CALCIUM: 9 mg/dL (ref 8.9–10.3)
CO2: 29 mmol/L (ref 22–32)
CREATININE: 0.87 mg/dL (ref 0.44–1.00)
Chloride: 102 mmol/L (ref 101–111)
Glucose, Bld: 99 mg/dL (ref 65–99)
Potassium: 4.2 mmol/L (ref 3.5–5.1)
Sodium: 137 mmol/L (ref 135–145)

## 2017-01-09 LAB — CBC
HCT: 39.1 % (ref 36.0–46.0)
HEMOGLOBIN: 13.1 g/dL (ref 12.0–15.0)
MCH: 29.9 pg (ref 26.0–34.0)
MCHC: 33.5 g/dL (ref 30.0–36.0)
MCV: 89.3 fL (ref 78.0–100.0)
PLATELETS: 391 10*3/uL (ref 150–400)
RBC: 4.38 MIL/uL (ref 3.87–5.11)
RDW: 13.5 % (ref 11.5–15.5)
WBC: 5.6 10*3/uL (ref 4.0–10.5)

## 2017-01-09 LAB — SURGICAL PCR SCREEN
MRSA, PCR: NEGATIVE
STAPHYLOCOCCUS AUREUS: NEGATIVE

## 2017-01-14 ENCOUNTER — Telehealth (INDEPENDENT_AMBULATORY_CARE_PROVIDER_SITE_OTHER): Payer: Self-pay | Admitting: Orthopaedic Surgery

## 2017-01-14 NOTE — Telephone Encounter (Signed)
Please advise 

## 2017-01-14 NOTE — Telephone Encounter (Signed)
Patient called and wanted to know what she can take in between the doses of her oxycodone.  She states that she is in a lot of pain.  Cb# [phone]. Thank you

## 2017-01-14 NOTE — Telephone Encounter (Signed)
She can take Aleve in between the oxycodone

## 2017-01-14 NOTE — Telephone Encounter (Signed)
Patient aware of the below message from Dr. Meriel Flavors

## 2017-01-16 MED ORDER — DEXTROSE 5 % IV SOLN
3.0000 g | INTRAVENOUS | Status: AC
  Administered 2017-01-17: 3 g via INTRAVENOUS
  Filled 2017-01-16 (×2): qty 3000

## 2017-01-17 ENCOUNTER — Inpatient Hospital Stay (HOSPITAL_COMMUNITY): Payer: Medicare Other

## 2017-01-17 ENCOUNTER — Encounter (HOSPITAL_COMMUNITY)
Admission: RE | Disposition: A | Discharge status: Home Health | Payer: Self-pay | Source: Ambulatory Visit | Attending: Orthopaedic Surgery

## 2017-01-17 ENCOUNTER — Encounter (HOSPITAL_COMMUNITY): Payer: Self-pay | Admitting: *Deleted

## 2017-01-17 ENCOUNTER — Inpatient Hospital Stay (HOSPITAL_COMMUNITY)
Admission: RE | Admit: 2017-01-17 | Discharge: 2017-01-20 | DRG: 470 | Disposition: A | Discharge status: Home Health | Payer: Medicare Other | Source: Ambulatory Visit | Attending: Orthopaedic Surgery | Admitting: Orthopaedic Surgery

## 2017-01-17 ENCOUNTER — Inpatient Hospital Stay (HOSPITAL_COMMUNITY): Payer: Medicare Other | Admitting: Certified Registered Nurse Anesthetist

## 2017-01-17 DIAGNOSIS — Z833 Family history of diabetes mellitus: Secondary | ICD-10-CM | POA: Diagnosis not present

## 2017-01-17 DIAGNOSIS — I10 Essential (primary) hypertension: Secondary | ICD-10-CM | POA: Diagnosis present

## 2017-01-17 DIAGNOSIS — M5030 Other cervical disc degeneration, unspecified cervical region: Secondary | ICD-10-CM | POA: Diagnosis not present

## 2017-01-17 DIAGNOSIS — F419 Anxiety disorder, unspecified: Secondary | ICD-10-CM | POA: Diagnosis present

## 2017-01-17 DIAGNOSIS — Z96652 Presence of left artificial knee joint: Secondary | ICD-10-CM

## 2017-01-17 DIAGNOSIS — M25562 Pain in left knee: Secondary | ICD-10-CM | POA: Diagnosis present

## 2017-01-17 DIAGNOSIS — Z8249 Family history of ischemic heart disease and other diseases of the circulatory system: Secondary | ICD-10-CM

## 2017-01-17 DIAGNOSIS — J45909 Unspecified asthma, uncomplicated: Secondary | ICD-10-CM | POA: Diagnosis present

## 2017-01-17 DIAGNOSIS — M1712 Unilateral primary osteoarthritis, left knee: Secondary | ICD-10-CM | POA: Diagnosis not present

## 2017-01-17 HISTORY — PX: TOTAL KNEE ARTHROPLASTY: SHX125

## 2017-01-17 SURGERY — ARTHROPLASTY, KNEE, TOTAL
Anesthesia: General | Site: Knee | Laterality: Left

## 2017-01-17 MED ORDER — ONDANSETRON HCL 4 MG/2ML IJ SOLN
INTRAMUSCULAR | Status: DC | PRN
  Administered 2017-01-17: 4 mg via INTRAVENOUS

## 2017-01-17 MED ORDER — SODIUM CHLORIDE 0.9 % IV SOLN
1000.0000 mg | INTRAVENOUS | Status: AC
  Administered 2017-01-17: 1000 mg via INTRAVENOUS
  Filled 2017-01-17: qty 1100

## 2017-01-17 MED ORDER — ACETAMINOPHEN 325 MG PO TABS: 650.0000 mg | ORAL_TABLET | Freq: Four times a day (QID) | ORAL | Status: DC | PRN

## 2017-01-17 MED ORDER — BUDESONIDE 0.25 MG/2ML IN SUSP
0.2500 mg | Freq: Two times a day (BID) | RESPIRATORY_TRACT | Status: DC
  Administered 2017-01-17 – 2017-01-20 (×3): 0.25 mg via RESPIRATORY_TRACT
  Filled 2017-01-17 (×5): qty 2

## 2017-01-17 MED ORDER — SODIUM CHLORIDE 0.9 % IR SOLN
Status: DC | PRN
  Administered 2017-01-17: 1000 mL

## 2017-01-17 MED ORDER — LACTATED RINGERS IV SOLN
INTRAVENOUS | Status: DC | PRN
  Administered 2017-01-17 (×2): via INTRAVENOUS

## 2017-01-17 MED ORDER — OXYCODONE HCL 5 MG PO TABS
5.0000 mg | ORAL_TABLET | ORAL | Status: DC | PRN
  Administered 2017-01-17 (×2): 10 mg via ORAL
  Administered 2017-01-18: 05:00:00 15 mg via ORAL
  Administered 2017-01-18: 20:00:00 10 mg via ORAL
  Administered 2017-01-18: 02:00:00 15 mg via ORAL
  Administered 2017-01-18: 10 mg via ORAL
  Administered 2017-01-19 (×4): 15 mg via ORAL
  Administered 2017-01-19 – 2017-01-20 (×3): 10 mg via ORAL
  Administered 2017-01-20: 15 mg via ORAL
  Filled 2017-01-17: qty 2
  Filled 2017-01-17 (×3): qty 3
  Filled 2017-01-17 (×2): qty 2
  Filled 2017-01-17: qty 3
  Filled 2017-01-17: qty 1
  Filled 2017-01-17 (×2): qty 3
  Filled 2017-01-17: qty 2
  Filled 2017-01-17: qty 3
  Filled 2017-01-17: qty 2
  Filled 2017-01-17: qty 3
  Filled 2017-01-17 (×2): qty 2

## 2017-01-17 MED ORDER — PHENOBARBITAL 30 MG PO TABS: 30.0000 mg | ORAL_TABLET | Freq: Every day | ORAL | Status: DC

## 2017-01-17 MED ORDER — DOCUSATE SODIUM 100 MG PO CAPS
100.0000 mg | ORAL_CAPSULE | Freq: Two times a day (BID) | ORAL | Status: DC
  Administered 2017-01-17 – 2017-01-20 (×6): 100 mg via ORAL
  Filled 2017-01-17 (×6): qty 1

## 2017-01-17 MED ORDER — PROPOFOL 10 MG/ML IV BOLUS
INTRAVENOUS | Status: AC
  Filled 2017-01-17: qty 20

## 2017-01-17 MED ORDER — LOSARTAN POTASSIUM 50 MG PO TABS: 100.0000 mg | ORAL_TABLET | Freq: Every day | ORAL | Status: DC

## 2017-01-17 MED ORDER — PROPOFOL 10 MG/ML IV BOLUS
INTRAVENOUS | Status: AC
  Filled 2017-01-17: qty 40

## 2017-01-17 MED ORDER — FENTANYL CITRATE (PF) 100 MCG/2ML IJ SOLN
INTRAMUSCULAR | Status: AC
  Filled 2017-01-17: qty 2

## 2017-01-17 MED ORDER — METOCLOPRAMIDE HCL 5 MG/ML IJ SOLN: 5.0000 mg | Freq: Three times a day (TID) | INTRAMUSCULAR | Status: DC | PRN

## 2017-01-17 MED ORDER — LIDOCAINE 2% (20 MG/ML) 5 ML SYRINGE
INTRAMUSCULAR | Status: AC
  Filled 2017-01-17: qty 5

## 2017-01-17 MED ORDER — ZOLPIDEM TARTRATE 5 MG PO TABS: 5.0000 mg | ORAL_TABLET | Freq: Every evening | ORAL | Status: DC | PRN

## 2017-01-17 MED ORDER — LOSARTAN POTASSIUM 50 MG PO TABS
100.0000 mg | ORAL_TABLET | Freq: Every day | ORAL | Status: DC
  Administered 2017-01-18 – 2017-01-19 (×2): 100 mg via ORAL
  Filled 2017-01-17 (×3): qty 2

## 2017-01-17 MED ORDER — ONDANSETRON HCL 4 MG/2ML IJ SOLN: 4.0000 mg | Freq: Four times a day (QID) | INTRAMUSCULAR | Status: DC | PRN

## 2017-01-17 MED ORDER — PROPOFOL 10 MG/ML IV BOLUS: INTRAVENOUS | Status: DC | PRN

## 2017-01-17 MED ORDER — PHENOL 1.4 % MT LIQD
1.0000 | OROMUCOSAL | Status: DC | PRN
  Filled 2017-01-17 (×3): qty 177

## 2017-01-17 MED ORDER — DEXAMETHASONE SODIUM PHOSPHATE 10 MG/ML IJ SOLN
INTRAMUSCULAR | Status: DC | PRN
  Administered 2017-01-17: 10 mg via INTRAVENOUS

## 2017-01-17 MED ORDER — ATENOLOL 25 MG PO TABS
50.0000 mg | ORAL_TABLET | Freq: Every day | ORAL | Status: DC
  Administered 2017-01-18 – 2017-01-20 (×3): 50 mg via ORAL
  Filled 2017-01-17 (×3): qty 2

## 2017-01-17 MED ORDER — ACETAMINOPHEN 650 MG RE SUPP: 650.0000 mg | Freq: Four times a day (QID) | RECTAL | Status: DC | PRN

## 2017-01-17 MED ORDER — SODIUM CHLORIDE 0.9 % IV SOLN
INTRAVENOUS | Status: DC
  Administered 2017-01-18: 02:00:00 via INTRAVENOUS

## 2017-01-17 MED ORDER — ALPRAZOLAM 1 MG PO TABS
1.0000 mg | ORAL_TABLET | Freq: Two times a day (BID) | ORAL | Status: DC | PRN
  Administered 2017-01-19: 1 mg via ORAL
  Filled 2017-01-17: qty 1

## 2017-01-17 MED ORDER — ONDANSETRON HCL 4 MG PO TABS: 4.0000 mg | ORAL_TABLET | Freq: Four times a day (QID) | ORAL | Status: DC | PRN

## 2017-01-17 MED ORDER — PROPOFOL 500 MG/50ML IV EMUL
INTRAVENOUS | Status: DC | PRN
  Administered 2017-01-17: 30 ug/kg/min via INTRAVENOUS

## 2017-01-17 MED ORDER — DIPHENHYDRAMINE HCL 12.5 MG/5ML PO ELIX: 12.5000 mg | ORAL_SOLUTION | ORAL | Status: DC | PRN

## 2017-01-17 MED ORDER — AMLODIPINE BESYLATE 5 MG PO TABS
5.0000 mg | ORAL_TABLET | Freq: Every day | ORAL | Status: DC
  Administered 2017-01-18 – 2017-01-19 (×2): 5 mg via ORAL
  Filled 2017-01-17 (×3): qty 1

## 2017-01-17 MED ORDER — ROPIVACAINE HCL 7.5 MG/ML IJ SOLN
INTRAMUSCULAR | Status: AC
  Filled 2017-01-17: qty 20

## 2017-01-17 MED ORDER — ALUM & MAG HYDROXIDE-SIMETH 200-200-20 MG/5ML PO SUSP: 30.0000 mL | ORAL | Status: DC | PRN

## 2017-01-17 MED ORDER — HYDROCHLOROTHIAZIDE 25 MG PO TABS
25.0000 mg | ORAL_TABLET | Freq: Every day | ORAL | Status: DC
  Administered 2017-01-18 – 2017-01-19 (×2): 25 mg via ORAL
  Filled 2017-01-17 (×3): qty 1

## 2017-01-17 MED ORDER — FENTANYL CITRATE (PF) 100 MCG/2ML IJ SOLN
25.0000 ug | INTRAMUSCULAR | Status: DC | PRN
  Administered 2017-01-17: 50 ug via INTRAVENOUS

## 2017-01-17 MED ORDER — KETOROLAC TROMETHAMINE 15 MG/ML IJ SOLN
7.5000 mg | Freq: Four times a day (QID) | INTRAMUSCULAR | Status: AC
  Administered 2017-01-17 – 2017-01-18 (×4): 7.5 mg via INTRAVENOUS
  Filled 2017-01-17 (×3): qty 1

## 2017-01-17 MED ORDER — MIDAZOLAM HCL 2 MG/2ML IJ SOLN
INTRAMUSCULAR | Status: AC
  Filled 2017-01-17: qty 2

## 2017-01-17 MED ORDER — HYDROCHLOROTHIAZIDE 25 MG PO TABS: 25.0000 mg | ORAL_TABLET | Freq: Every day | ORAL | Status: DC

## 2017-01-17 MED ORDER — FENTANYL CITRATE (PF) 100 MCG/2ML IJ SOLN
INTRAMUSCULAR | Status: DC | PRN
  Administered 2017-01-17 (×4): 50 ug via INTRAVENOUS

## 2017-01-17 MED ORDER — CEFAZOLIN SODIUM-DEXTROSE 2-4 GM/100ML-% IV SOLN
2.0000 g | Freq: Four times a day (QID) | INTRAVENOUS | Status: AC
  Administered 2017-01-17 – 2017-01-18 (×2): 2 g via INTRAVENOUS
  Filled 2017-01-17 (×2): qty 100

## 2017-01-17 MED ORDER — LOSARTAN POTASSIUM-HCTZ 100-25 MG PO TABS: 1.0000 | ORAL_TABLET | Freq: Every day | ORAL | Status: DC

## 2017-01-17 MED ORDER — LIDOCAINE 2% (20 MG/ML) 5 ML SYRINGE
INTRAMUSCULAR | Status: DC | PRN
  Administered 2017-01-17: 50 mg via INTRAVENOUS

## 2017-01-17 MED ORDER — MENTHOL 3 MG MT LOZG
1.0000 | LOZENGE | OROMUCOSAL | Status: DC | PRN
  Filled 2017-01-17: qty 9

## 2017-01-17 MED ORDER — METHOCARBAMOL 500 MG PO TABS
500.0000 mg | ORAL_TABLET | Freq: Four times a day (QID) | ORAL | Status: DC | PRN
  Administered 2017-01-18 (×2): 500 mg via ORAL
  Filled 2017-01-17 (×5): qty 1

## 2017-01-17 MED ORDER — ONDANSETRON HCL 4 MG/2ML IJ SOLN
INTRAMUSCULAR | Status: AC
  Filled 2017-01-17: qty 2

## 2017-01-17 MED ORDER — METOCLOPRAMIDE HCL 5 MG PO TABS: 5.0000 mg | ORAL_TABLET | Freq: Three times a day (TID) | ORAL | Status: DC | PRN

## 2017-01-17 MED ORDER — ALBUTEROL SULFATE (2.5 MG/3ML) 0.083% IN NEBU: 2.5000 mg | INHALATION_SOLUTION | Freq: Four times a day (QID) | RESPIRATORY_TRACT | Status: DC | PRN

## 2017-01-17 MED ORDER — SUCCINYLCHOLINE CHLORIDE 20 MG/ML IJ SOLN
INTRAMUSCULAR | Status: DC | PRN
  Administered 2017-01-17: 120 mg via INTRAVENOUS

## 2017-01-17 MED ORDER — KETOROLAC TROMETHAMINE 15 MG/ML IJ SOLN
INTRAMUSCULAR | Status: AC
  Filled 2017-01-17: qty 1

## 2017-01-17 MED ORDER — STERILE WATER FOR IRRIGATION IR SOLN
Status: DC | PRN
  Administered 2017-01-17: 2000 mL

## 2017-01-17 MED ORDER — MIDAZOLAM HCL 5 MG/5ML IJ SOLN
INTRAMUSCULAR | Status: DC | PRN
  Administered 2017-01-17: 2 mg via INTRAVENOUS

## 2017-01-17 MED ORDER — PROPOFOL 10 MG/ML IV BOLUS
INTRAVENOUS | Status: DC | PRN
  Administered 2017-01-17: 120 mg via INTRAVENOUS
  Administered 2017-01-17: 150 mg via INTRAVENOUS

## 2017-01-17 MED ORDER — METHOCARBAMOL 1000 MG/10ML IJ SOLN
500.0000 mg | Freq: Four times a day (QID) | INTRAVENOUS | Status: DC | PRN
  Administered 2017-01-17: 500 mg via INTRAVENOUS
  Filled 2017-01-17: qty 550
  Filled 2017-01-17: qty 5

## 2017-01-17 MED ORDER — RIVAROXABAN 10 MG PO TABS
10.0000 mg | ORAL_TABLET | Freq: Every day | ORAL | Status: DC
  Administered 2017-01-18 – 2017-01-20 (×3): 10 mg via ORAL
  Filled 2017-01-17 (×3): qty 1

## 2017-01-17 MED ORDER — ALBUTEROL SULFATE (2.5 MG/3ML) 0.083% IN NEBU
3.0000 mL | INHALATION_SOLUTION | Freq: Four times a day (QID) | RESPIRATORY_TRACT | Status: DC | PRN
  Administered 2017-01-18 – 2017-01-19 (×5): 3 mL via RESPIRATORY_TRACT
  Filled 2017-01-17 (×6): qty 3

## 2017-01-17 MED ORDER — PHENTERMINE HCL 30 MG PO CAPS: 30.0000 mg | ORAL_CAPSULE | ORAL | Status: DC

## 2017-01-17 MED ORDER — LACTATED RINGERS IR SOLN
Status: DC | PRN
  Administered 2017-01-17: 2000 mL

## 2017-01-17 MED ORDER — HYDROMORPHONE HCL 1 MG/ML IJ SOLN
1.0000 mg | INTRAMUSCULAR | Status: DC | PRN
  Administered 2017-01-17 – 2017-01-18 (×2): 1 mg via INTRAVENOUS
  Administered 2017-01-18 – 2017-01-19 (×3): 2 mg via INTRAVENOUS
  Filled 2017-01-17: qty 2
  Filled 2017-01-17 (×2): qty 1
  Filled 2017-01-17 (×2): qty 2

## 2017-01-17 SURGICAL SUPPLY — 48 items
APL SKNCLS STERI-STRIP NONHPOA (GAUZE/BANDAGES/DRESSINGS)
BAG SPEC THK2 15X12 ZIP CLS (MISCELLANEOUS)
BAG ZIPLOCK 12X15 (MISCELLANEOUS) IMPLANT
BANDAGE ACE 6X5 VEL STRL LF (GAUZE/BANDAGES/DRESSINGS) ×5 IMPLANT
BENZOIN TINCTURE PRP APPL 2/3 (GAUZE/BANDAGES/DRESSINGS) IMPLANT
BLADE SAG 18X100X1.27 (BLADE) ×2 IMPLANT
BOWL SMART MIX CTS (DISPOSABLE) ×3 IMPLANT
CAPT KNEE TOTAL 3 ×2 IMPLANT
CEMENT BONE SIMPLEX SPEEDSET (Cement) ×6 IMPLANT
CLOSURE WOUND 1/2 X4 (GAUZE/BANDAGES/DRESSINGS)
CLOTH BEACON ORANGE TIMEOUT ST (SAFETY) ×3 IMPLANT
CUFF TOURN SGL QUICK 44 (TOURNIQUET CUFF) ×2 IMPLANT
DRAPE U-SHAPE 47X51 STRL (DRAPES) ×3 IMPLANT
DRSG AQUACEL AG ADV 3.5X10 (GAUZE/BANDAGES/DRESSINGS) ×3 IMPLANT
DRSG PAD ABDOMINAL 8X10 ST (GAUZE/BANDAGES/DRESSINGS) ×3 IMPLANT
DURAPREP 26ML APPLICATOR (WOUND CARE) ×3 IMPLANT
ELECT REM PT RETURN 9FT ADLT (ELECTROSURGICAL) ×3
ELECTRODE REM PT RTRN 9FT ADLT (ELECTROSURGICAL) ×1 IMPLANT
GAUZE SPONGE 4X4 12PLY STRL (GAUZE/BANDAGES/DRESSINGS) ×3 IMPLANT
GAUZE XEROFORM 1X8 LF (GAUZE/BANDAGES/DRESSINGS) ×2 IMPLANT
GLOVE BIO SURGEON STRL SZ7.5 (GLOVE) ×3 IMPLANT
GLOVE BIOGEL PI IND STRL 8 (GLOVE) ×2 IMPLANT
GLOVE BIOGEL PI INDICATOR 8 (GLOVE) ×4
GLOVE ECLIPSE 8.0 STRL XLNG CF (GLOVE) ×3 IMPLANT
GOWN STRL REUS W/TWL XL LVL3 (GOWN DISPOSABLE) ×6 IMPLANT
HANDPIECE INTERPULSE COAX TIP (DISPOSABLE) ×3
IMMOBILIZER KNEE 20 (SOFTGOODS) ×3
IMMOBILIZER KNEE 20 THIGH 36 (SOFTGOODS) ×1 IMPLANT
PACK TOTAL KNEE CUSTOM (KITS) ×3 IMPLANT
PADDING CAST COTTON 6X4 STRL (CAST SUPPLIES) ×4 IMPLANT
POSITIONER SURGICAL ARM (MISCELLANEOUS) ×3 IMPLANT
SET HNDPC FAN SPRY TIP SCT (DISPOSABLE) ×1 IMPLANT
SET PAD KNEE POSITIONER (MISCELLANEOUS) ×3 IMPLANT
STAPLER VISISTAT 35W (STAPLE) ×2 IMPLANT
STRIP CLOSURE SKIN 1/2X4 (GAUZE/BANDAGES/DRESSINGS) IMPLANT
SUCTION FRAZIER HANDLE 12FR (TUBING) ×2
SUCTION TUBE FRAZIER 12FR DISP (TUBING) ×1 IMPLANT
SUT MNCRL AB 4-0 PS2 18 (SUTURE) IMPLANT
SUT VIC AB 0 CT1 27 (SUTURE) ×3
SUT VIC AB 0 CT1 27XBRD ANTBC (SUTURE) ×1 IMPLANT
SUT VIC AB 1 CT1 27 (SUTURE) ×6
SUT VIC AB 1 CT1 27XBRD ANTBC (SUTURE) ×2 IMPLANT
SUT VIC AB 1 CT1 36 (SUTURE) ×8 IMPLANT
SUT VIC AB 2-0 CT1 27 (SUTURE) ×12
SUT VIC AB 2-0 CT1 TAPERPNT 27 (SUTURE) ×2 IMPLANT
TRAY FOLEY CATH SILVER 14FR (SET/KITS/TRAYS/PACK) ×2 IMPLANT
WRAP KNEE MAXI GEL POST OP (GAUZE/BANDAGES/DRESSINGS) ×3 IMPLANT
YANKAUER SUCT BULB TIP 10FT TU (MISCELLANEOUS) ×3 IMPLANT

## 2017-01-17 NOTE — Transfer of Care (Signed)
Immediate Anesthesia Transfer of Care Note  Patient: [name]  Procedure(s) Performed: Procedure(s): LEFT TOTAL KNEE ARTHROPLASTY (Left)  Patient Location: PACU  Anesthesia Type:General  Level of Consciousness: awake, alert , oriented and patient cooperative  Airway & Oxygen Therapy: Patient Spontanous Breathing and Patient connected to face mask oxygen  Post-op Assessment: Report given to RN, Post -op Vital signs reviewed and stable and Patient moving all extremities X 4  Post vital signs: stable  Last Vitals:  Vitals:   01/17/17 1141 01/17/17 1538  BP: (!) 144/73 137/85  Pulse: 62 61  Resp: 18 15  Temp: 36.5 C 36.7 C    Last Pain:  Vitals:   01/17/17 1156  TempSrc:   PainSc: 6       Patients Stated Pain Goal: 4 ([id number])  Complications: No apparent anesthesia complications moves to knees

## 2017-01-17 NOTE — Anesthesia Procedure Notes (Signed)
Anesthesia Regional Block:  Adductor canal block  Pre-Anesthetic Checklist: ,, timeout performed, Correct Patient, Correct Site, Correct Laterality, Correct Procedure, Correct Position, site marked, Risks and benefits discussed,  Surgical consent,  Pre-op evaluation,  At surgeon's request and post-op pain management  Laterality: Left  Prep: chloraprep       Needles:  Injection technique: Single-shot  Needle Type: Echogenic Stimulator Needle     Needle Length:cm 9 cm Needle Gauge: 21 and 21 G    Additional Needles:  Procedures: ultrasound guided (picture in chart) Adductor canal block Narrative:  Start time: 01/17/2017 12:50 PM End time: 01/17/2017 1:00 PM Injection made incrementally with aspirations every 5 mL.  Performed by: Personally  Anesthesiologist: Lillia Abed  Additional Notes: Monitors applied. Patient sedated. Sterile prep and drape,hand hygiene and sterile gloves were used. Relevant anatomy identified.Needle position confirmed.Local anesthetic injected incrementally after negative aspiration. Local anesthetic spread visualized around nerve(s). Vascular puncture avoided. No complications. Image printed for medical record.The patient tolerated the procedure well.    Lillia Abed MD

## 2017-01-17 NOTE — Anesthesia Procedure Notes (Signed)
Procedure Name: Intubation Date/Time: 01/17/2017 2:33 PM Performed by: Carleene Cooper A Pre-anesthesia Checklist: Patient identified, Timeout performed, Emergency Drugs available, Suction available and Patient being monitored Patient Re-evaluated:Patient Re-evaluated prior to inductionOxygen Delivery Method: Circle system utilized Preoxygenation: Pre-oxygenation with 100% oxygen Intubation Type: IV induction Laryngoscope Size: Glidescope and 4 Grade View: Grade I Tube type: Oral Tube size: 7.5 mm Number of attempts: 1 Airway Equipment and Method: Video-laryngoscopy and Stylet Placement Confirmation: ETT inserted through vocal cords under direct vision,  positive ETCO2 and breath sounds checked- equal and bilateral Secured at: 22 cm Tube secured with: Tape Dental Injury: Teeth and Oropharynx as per pre-operative assessment

## 2017-01-17 NOTE — Progress Notes (Signed)
AssistedDr. Ossey with left, ultrasound guided, adductor canal block. Side rails up, monitors on throughout procedure. See vital signs in flow sheet. Tolerated Procedure well.  

## 2017-01-17 NOTE — Brief Op Note (Signed)
01/17/2017  3:11 PM  PATIENT:  Cottie Banda  56 y.o. female  PRE-OPERATIVE DIAGNOSIS:  severe osteoarthritis left knee  POST-OPERATIVE DIAGNOSIS:  severe osteoarthritis left knee  PROCEDURE:  Procedure(s): LEFT TOTAL KNEE ARTHROPLASTY (Left)  SURGEON:  Surgeon(s) and Role:    * Mcarthur Rossetti, MD - Primary  PHYSICIAN ASSISTANT: Benita Stabile, PA-C  ANESTHESIA:   regional, spinal and general  EBL:  Total I/O In: 1000 [I.V.:1000] Out: 200 [Urine:100; Blood:100]  COUNTS:  YES  TOURNIQUET:   Total Tourniquet Time Documented: Thigh (Left) - 71 minutes Total: Thigh (Left) - 71 minutes   DICTATION: .Other Dictation: Dictation Number [id number]  PLAN OF CARE: Admit to inpatient   PATIENT DISPOSITION:  PACU - hemodynamically stable.   Delay start of Pharmacological VTE agent (>24hrs) due to surgical blood loss or risk of bleeding: no

## 2017-01-17 NOTE — Anesthesia Procedure Notes (Signed)
Spinal  Patient location during procedure: OR Start time: 01/17/2017 1:09 PM End time: 01/17/2017 1:14 PM Staffing Anesthesiologist: Lillia Abed Performed: anesthesiologist  Preanesthetic Checklist Completed: patient identified, surgical consent, pre-op evaluation, timeout performed, IV checked, risks and benefits discussed and monitors and equipment checked Spinal Block Patient position: sitting Prep: Betadine Patient monitoring: heart rate, cardiac monitor, continuous pulse ox and blood pressure Approach: right paramedian Location: L3-4 Injection technique: single-shot Needle Needle type: Pencan  Needle gauge: 24 G Needle length: 9 cm Needle insertion depth: 9 cm

## 2017-01-17 NOTE — H&P (Signed)
TOTAL KNEE ADMISSION H&P  Patient is being admitted for left total knee arthroplasty.  Subjective:  Chief Complaint:left knee pain.  HPI: [name], 56 y.o. female, has a history of pain and functional disability in the left knee due to arthritis and has failed non-surgical conservative treatments for greater than 12 weeks to includeNSAID's and/or analgesics, corticosteriod injections, viscosupplementation injections, flexibility and strengthening excercises, use of assistive devices, weight reduction as appropriate and activity modification.  Onset of symptoms was gradual, starting 3 years ago with gradually worsening course since that time. The patient noted no past surgery on the left knee(s).  Patient currently rates pain in the left knee(s) at 10 out of 10 with activity. Patient has night pain, worsening of pain with activity and weight bearing, pain that interferes with activities of daily living, pain with passive range of motion, crepitus and joint swelling.  Patient has evidence of subchondral sclerosis, periarticular osteophytes and joint space narrowing by imaging studies. There is no active infection.  Patient Active Problem List   Diagnosis Date Noted  . Unilateral primary osteoarthritis, left knee 12/05/2016  . Degenerative disc disease, cervical    Past Medical History:  Diagnosis Date  . Anxiety   . Asthma   . Degenerative disc disease   . Degenerative disc disease, cervical   . Degenerative disc disease, cervical   . Headache    regular  . Hypertension   . Sleep apnea    mild, patient does not use mask    Past Surgical History:  Procedure Laterality Date  . BACK SURGERY    . NOVASURE ABLATION    . TUBAL LIGATION      No prescriptions prior to admission.   No Known Allergies  Social History  Substance Use Topics  . Smoking status: Never Smoker  . Smokeless tobacco: Never Used  . Alcohol use Yes     Comment: occasional/social    Family History   Problem Relation Age of Onset  . Cancer Mother   . Diabetes Father   . Heart disease Father   . Cancer Sister      Review of Systems  Musculoskeletal: Positive for joint pain.  All other systems reviewed and are negative.   Objective:  Physical Exam  Constitutional: She is oriented to person, place, and time. She appears well-developed and well-nourished.  HENT:  Head: Normocephalic and atraumatic.  Eyes: EOM are normal. Pupils are equal, round, and reactive to light.  Neck: Normal range of motion. Neck supple.  Cardiovascular: Normal rate and regular rhythm.   Respiratory: Effort normal and breath sounds normal.  GI: Soft. Bowel sounds are normal.  Musculoskeletal:       Left knee: She exhibits decreased range of motion, swelling, effusion and abnormal alignment. Tenderness found. Medial joint line and lateral joint line tenderness noted.  Neurological: She is alert and oriented to person, place, and time.  Skin: Skin is warm and dry.  Psychiatric: She has a normal mood and affect.    Vital signs in last 24 hours:    Labs:   Estimated body mass index is 49.87 kg/m as calculated from the following:   Height as of 01/09/17: 5\' 8"  (1.727 m).   Weight as of 01/09/17: 328 lb (148.8 kg).   Imaging Review Plain radiographs demonstrate severe degenerative joint disease of the left knee(s). The overall alignment ismild varus. The bone quality appears to be good for age and reported activity level.  Assessment/Plan:  End stage arthritis,  left knee   The patient history, physical examination, clinical judgment of the provider and imaging studies are consistent with end stage degenerative joint disease of the left knee(s) and total knee arthroplasty is deemed medically necessary. The treatment options including medical management, injection therapy arthroscopy and arthroplasty were discussed at length. The risks and benefits of total knee arthroplasty were presented and  reviewed. The risks due to aseptic loosening, infection, stiffness, patella tracking problems, thromboembolic complications and other imponderables were discussed. The patient acknowledged the explanation, agreed to proceed with the plan and consent was signed. Patient is being admitted for inpatient treatment for surgery, pain control, PT, OT, prophylactic antibiotics, VTE prophylaxis, progressive ambulation and ADL's and discharge planning. The patient is planning to be discharged home with home health services

## 2017-01-17 NOTE — Progress Notes (Signed)
PHARMACIST - PHYSICIAN ORDER COMMUNICATION  CONCERNING: P&T Medication Policy on Herbal Medications  DESCRIPTION:  This patient's order for: phentermine noted. (Have not taken since 01/09/17) This product(s) is classified as an "herbal" or natural product. Due to a lack of definitive safety studies or FDA approval, nonstandard manufacturing practices, plus the potential risk of unknown drug-drug interactions while on inpatient medications, the Pharmacy and Therapeutics Committee does not permit the use of "herbal" or natural products of this type within [facility].   ACTION TAKEN: The pharmacy department is unable to verify this order at this time and your patient has been informed of this safety policy. Please reevaluate patient's clinical condition at discharge and address if the herbal or natural product(s) should be resumed at that time.

## 2017-01-17 NOTE — Anesthesia Procedure Notes (Signed)
Procedure Name: LMA Insertion Date/Time: 01/17/2017 1:44 PM Performed by: [name] Pre-anesthesia Checklist: Patient identified, Timeout performed, Emergency Drugs available, Suction available and Patient being monitored Patient Re-evaluated:Patient Re-evaluated prior to inductionOxygen Delivery Method: Circle system utilized Preoxygenation: Pre-oxygenation with 100% oxygen Intubation Type: IV induction LMA: LMA with gastric port inserted LMA Size: 4.0 Number of attempts: 1 Placement Confirmation: positive ETCO2 and breath sounds checked- equal and bilateral Tube secured with: Tape Dental Injury: Teeth and Oropharynx as per pre-operative assessment

## 2017-01-17 NOTE — Anesthesia Postprocedure Evaluation (Signed)
Anesthesia Post Note  Patient: TALEASHA LUTTRULL  Procedure(s) Performed: Procedure(s) (LRB): LEFT TOTAL KNEE ARTHROPLASTY (Left)  Patient location during evaluation: PACU Anesthesia Type: General Level of consciousness: awake and alert Pain management: pain level controlled Vital Signs Assessment: post-procedure vital signs reviewed and stable Respiratory status: spontaneous breathing, nonlabored ventilation, respiratory function stable and patient connected to nasal cannula oxygen Cardiovascular status: blood pressure returned to baseline and stable Postop Assessment: no signs of nausea or vomiting Anesthetic complications: no       Last Vitals:  Vitals:   01/17/17 1645 01/17/17 1656  BP: 126/71 127/74  Pulse: (!) 48 (!) 51  Resp: 13 13  Temp:  36.4 C    Last Pain:  Vitals:   01/17/17 1645  TempSrc:   PainSc: 3                  Renly Roots DAVID

## 2017-01-17 NOTE — Anesthesia Preprocedure Evaluation (Signed)
Anesthesia Evaluation  Patient identified by MRN, date of birth, ID band Patient awake    Reviewed: Allergy & Precautions, NPO status , Patient's Chart, lab work & pertinent test results  Airway Mallampati: II  TM Distance: >3 FB Neck ROM: Full    Dental   Pulmonary sleep apnea ,    Pulmonary exam normal        Cardiovascular hypertension, Pt. on medications Normal cardiovascular exam     Neuro/Psych    GI/Hepatic   Endo/Other    Renal/GU      Musculoskeletal   Abdominal   Peds  Hematology   Anesthesia Other Findings   Reproductive/Obstetrics                             Anesthesia Physical Anesthesia Plan  ASA: III  Anesthesia Plan: Spinal and MAC   Post-op Pain Management:  Regional for Post-op pain   Induction: Intravenous  Airway Management Planned:   Additional Equipment:   Intra-op Plan:   Post-operative Plan: Extubation in OR  Informed Consent: I have reviewed the patients History and Physical, chart, labs and discussed the procedure including the risks, benefits and alternatives for the proposed anesthesia with the patient or authorized representative who has indicated his/her understanding and acceptance.     Plan Discussed with: CRNA and Surgeon  Anesthesia Plan Comments:         Anesthesia Quick Evaluation

## 2017-01-18 LAB — CBC
HCT: 32 % — ABNORMAL LOW (ref 36.0–46.0)
Hemoglobin: 11.4 g/dL — ABNORMAL LOW (ref 12.0–15.0)
MCH: 31.4 pg (ref 26.0–34.0)
MCHC: 35.6 g/dL (ref 30.0–36.0)
MCV: 88.2 fL (ref 78.0–100.0)
PLATELETS: 356 10*3/uL (ref 150–400)
RBC: 3.63 MIL/uL — ABNORMAL LOW (ref 3.87–5.11)
RDW: 13.3 % (ref 11.5–15.5)
WBC: 12.4 10*3/uL — ABNORMAL HIGH (ref 4.0–10.5)

## 2017-01-18 LAB — BASIC METABOLIC PANEL
Anion gap: 9 (ref 5–15)
BUN: 16 mg/dL (ref 6–20)
CHLORIDE: 101 mmol/L (ref 101–111)
CO2: 24 mmol/L (ref 22–32)
CREATININE: 0.73 mg/dL (ref 0.44–1.00)
Calcium: 8.3 mg/dL — ABNORMAL LOW (ref 8.9–10.3)
GFR calc Af Amer: >60 mL/min (ref >60)
GLUCOSE: 138 mg/dL — AB (ref 65–99)
POTASSIUM: 3.7 mmol/L (ref 3.5–5.1)
SODIUM: 134 mmol/L — AB (ref 135–145)

## 2017-01-18 MED ORDER — HYDROMORPHONE HCL 2 MG PO TABS
2.0000 mg | ORAL_TABLET | ORAL | Status: DC | PRN
  Administered 2017-01-18: 2 mg via ORAL
  Filled 2017-01-18: qty 1

## 2017-01-18 NOTE — Progress Notes (Signed)
OT Cancellation Note  Patient Details Name: ELAYJAH VIGNEAULT MRN: [id number] DOB: 1961-07-26   Cancelled Treatment:    Reason Eval/Treat Not Completed: Other (comment).  Pt finished with PT recently and had increased pain and fatique. Will check on pt tomorrow  Equan Cogbill 01/18/2017, 3:50 PM  Lesle Chris, OTR/L [id number] 01/18/2017

## 2017-01-18 NOTE — Care Management Note (Signed)
Case Management Note  Patient Details  Name: [name] MRN: [id number] Date of Birth: 05-04-1961  Subjective/Objective:     Left TKA               Action/Plan: Discharge Planning: NCM spoke to pt. Preoperatively arranged with [facility] for [facility]. Pt agreeable to [name]. Requesting RW, 3n1 bedside commode and tub bench for home. Contacted [facility] for DME for home.   PCP Jeanie Cooks EDWIN   Expected Discharge Date:  01/19/17               Expected Discharge Plan:  [place]  In-House Referral:  NA  Discharge planning Services  CM Consult  Post Acute Care Choice:  Home Health Choice offered to:  Patient  DME Arranged:  3-N-1, Walker rolling, Tub bench DME Agency:  [place]:  PT [place] Agency:  [name]  Status of Service:  Completed, signed off  If discussed at [employer] of Stay Meetings, dates discussed:    Additional Comments:  Erenest Rasher, RN 01/18/2017, 11:15 AM

## 2017-01-18 NOTE — Evaluation (Signed)
Physical Therapy Evaluation Patient Details Name: [name] MRN: [id number] DOB: December 12, 1961 Today's Date: 01/18/2017   History of Present Illness  Pt s/p L TKR  Clinical Impression  Pt s/p L TKR and presents with decreased L LE strength/ROM and post op pain limiting functional mobility.  Pt should progress to dc home with family assist and HHPT follow up.    Follow Up Recommendations Home health PT    Equipment Recommendations  Rolling walker with 5" wheels (Wide RW please, pt is 320 lbs)    Recommendations for Other Services OT consult     Precautions / Restrictions Precautions Precautions: Knee;Fall Required Braces or Orthoses: Knee Immobilizer - Left Knee Immobilizer - Left: Discontinue once straight leg raise with < 10 degree lag Restrictions Weight Bearing Restrictions: No Other Position/Activity Restrictions: WBAT      Mobility  Bed Mobility Overal bed mobility: Needs Assistance Bed Mobility: Supine to Sit;Sit to Supine     Supine to sit: Min assist;+2 for safety/equipment;HOB elevated Sit to supine: Min assist;+2 for safety/equipment   General bed mobility comments: cues for sequence, assist for L LE and pt utilizing bed rail  Transfers Overall transfer level: Needs assistance Equipment used: Rolling walker (2 wheeled) Transfers: Sit to/from Stand Sit to Stand: Min assist;+2 physical assistance;+2 safety/equipment;From elevated surface         General transfer comment: cues for LE management and use of UEs to self assist  Ambulation/Gait Ambulation/Gait assistance: Min assist;+2 safety/equipment Ambulation Distance (Feet): 50 Feet Assistive device: Rolling walker (2 wheeled) Gait Pattern/deviations: Step-to pattern;Decreased step length - right;Decreased step length - left;Shuffle;Wide base of support;Antalgic Gait velocity: decr Gait velocity interpretation: Below normal speed for age/gender General Gait Details: cues for sequence, posture,  position from RW and increased UE WB  Stairs            Wheelchair Mobility    Modified Rankin (Stroke Patients Only)       Balance Overall balance assessment: No apparent balance deficits (not formally assessed)                                           Pertinent Vitals/Pain Pain Assessment: 0-10 Pain Score: 4  Pain Location: L knee Pain Descriptors / Indicators: Aching;Sore Pain Intervention(s): Limited activity within patient's tolerance;Monitored during session;Premedicated before session;Ice applied    Home Living Family/patient expects to be discharged to:: Private residence Living Arrangements: Children Available Help at Discharge: Family Type of Home: Apartment Home Access: Level entry     Home Layout: One level Home Equipment: None      Prior Function Level of Independence: Independent               Hand Dominance        Extremity/Trunk Assessment   Upper Extremity Assessment Upper Extremity Assessment: Overall WFL for tasks assessed    Lower Extremity Assessment RLE Deficits / Details: Strength WFL with knee flex ltd to ~90 LLE Deficits / Details: 2+/5 quads with AAROM at knee -10 - 35    Cervical / Trunk Assessment Cervical / Trunk Assessment: Normal  Communication   Communication: No difficulties  Cognition Arousal/Alertness: Awake/alert Behavior During Therapy: WFL for tasks assessed/performed Overall Cognitive Status: Within Functional Limits for tasks assessed                      General Comments  Exercises Total Joint Exercises Ankle Circles/Pumps: AROM;Both;15 reps;Supine Quad Sets: AROM;Both;10 reps;Supine Heel Slides: AAROM;Left;10 reps;Supine Straight Leg Raises: AAROM;Left;15 reps;Supine   Assessment/Plan    PT Assessment Patient needs continued PT services  PT Problem List Decreased strength;Decreased range of motion;Decreased activity tolerance;Decreased mobility;Decreased  knowledge of use of DME;Pain;Obesity          PT Treatment Interventions DME instruction;Gait training;Functional mobility training;Therapeutic activities;Therapeutic exercise;Patient/family education    PT Goals (Current goals can be found in the Care Plan section)  Acute Rehab PT Goals Patient Stated Goal: Regain IND PT Goal Formulation: With patient Time For Goal Achievement: 01/23/17 Potential to Achieve Goals: Good    Frequency 7X/week   Barriers to discharge        Co-evaluation               End of Session Equipment Utilized During Treatment: Left knee immobilizer Activity Tolerance: Patient tolerated treatment well Patient left: in bed;with call bell/phone within reach;with family/visitor present Nurse Communication: Mobility status         Time: [id number] PT Time Calculation (min) (ACUTE ONLY): 42 min   Charges:   PT Evaluation $PT Eval Low Complexity: 1 Procedure PT Treatments $Gait Training: 8-22 mins $Therapeutic Exercise: 8-22 mins   PT G Codes:        Stasia Somero January 31, 2017, 12:39 PM

## 2017-01-18 NOTE — Clinical Social Work Note (Signed)
CSW order received to assist patient with SNF placement.  Review of chart indicates that PT is recommending home health PT.  CSW services will sign off but will be available if needed.  Lorie Phenix. Pauline Good, [place]  (weekend coverage)

## 2017-01-18 NOTE — Progress Notes (Signed)
Patient ID: [name], female   DOB: 1961/04/08, 56 y.o.   MRN: [id number] Patient is a 56 year old woman who is postoperative day 1 left total knee arthroplasty. Plan for physical therapy progressive ambulation discharged to home when safe from a therapy standpoint.

## 2017-01-18 NOTE — Op Note (Deleted)
  The note originally documented on this encounter has been moved the the encounter in which it belongs.  

## 2017-01-18 NOTE — Op Note (Signed)
NAMESHERIL, BRATTAIN NO.:  [id number]  MEDICAL RECORD NO.:  [id number]  LOCATION:                                 FACILITY:  PHYSICIAN:  Lind Guest. 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  PROCEDURE:  Left total knee arthroplasty.  IMPLANTS:  Stryker Triathlon knee with size 4 femur, size 3 tibial tray, 9 mm fix-bearing polyethylene insert, size 32 patellar button.  SURGEON:  Lind Guest. Ninfa Linden, MD  ASSISTANT:  Erskine Emery, PA-C  ANESTHESIA: 1. Left lower extremity adductor canal block. 2. Attempted spinal. 3. General.  TOURNIQUET TIME:  An hour and half.  BLOOD LOSS:  Less than 200 mL.  COMPLICATIONS:  None.  ANTIBIOTICS:  2 g of IV Ancef.  INDICATIONS:  Ms. [name] is a 56 year old female with debilitating arthritis involving both her knees.  Her left knee is much worse than right.  She has had an arthroscopic intervention.  Rest, ice, heat, activity modification, and attempted weight loss, as well.  She is morbidly obese individual with a body mass index of around 50.  She understands fully the risks and benefits of this surgery and all the risks are high given her morbid obesity.  These include the risk of acute blood loss anemia, nerve and vessel injury, fracture, infection, DVT, and implant failure.  She understands our goals are decreased pain, improved mobility, and overall improved quality of life.  PROCEDURE DESCRIPTION:  After informed consent was obtained, appropriate left knee was marked.  She was brought to the operating room, after an adductor canal block was obtained, spinal anesthesia was then obtained and Foley catheter  was placed in the bladder.  A nonsterile tourniquet was placed around her upper left thigh and her left leg was prepped and draped with DuraPrep and sterile drapes.  Time-out was called and she was identified as correct patient and correct left knee.  We then used Esmarch to wrap out the leg.  Tourniquet was inflated to 300 mm of pressure.  We then made a direct midline incision over the patella and carried this proximally and distally.  We dissected down toward the knee joint and she then started to seem like she did have some pain response. Thus, we stopped over doing and general anesthesia was obtained, then we proceeded with the case.  We performed a medial parapatellar arthrotomy finding a very large joint effusion fraying, having significant periarticular osteophytes throughout the knee.  We then had the knee in a flexed position, removing remnants of the ACL, PCL, medial and lateral meniscus.  We used our tibial cutting guide, and took 9 mm off the high side correcting for varus and valgus and a neutral slope.  We went into the femur and used the intramedullary cutting guide for the femur.  We set this at 5 degrees externally rotated for left knee and 10 mm distal femoral cut.  We made this femoral cut  without difficulty.  We rolled the knee back down to full extension with a 9-mm extension block and achieved full extension.  We then went back to the femur in a flexed position.  We put the femoral sizing guide based off the epicondylar axis and 3 degrees externally rotated for left knee.  We chose a size 4 femur.  We put our 4-in-1 cutting block for size 4 femur and then made our anterior and posterior cuts followed by our chamfer cuts.  We then made our femoral box cut.  Attention was turned back to the tibia.  We set our rotation of the tibia off the tibial tubercle and the femur.  We retracted this to a size 3 femur.  We drilled for a universal base plate, given her obesity and  placed a keel as well.  After the keel punch was made, we placed size 3 tibial tray, followed by the size 4 femur.  We placed a 9 mm fix-bearing polyethylene insert, and we achieved full flexion extension and it was surprisingly stable given her obesity.  We then made our patellar cut and drilled 3 holes for a size 32 patellar button.  We then removed all instrumentation, irrigated the knee with normal saline solution.  We then mixed our cement and cemented the real Stryker Triathlon tibial tray size 3, followed by the real size 4 femur.  We then placed the real 9 mm fix-bearing polyethylene insert, and removed cement debris from the knee and cemented the patellar button.  We then removed more cement from debris once the cement had hardened completely.  With the tourniquet down, hemostasis was obtained with electrocautery.  We irrigated the knee again with pulsatile lavage and normal saline.  I then closed the arthrotomy with interrupted #1 Vicryl suture followed by 0 Vicryl in the deep tissue, 2-0 Vicryl in subcutaneous tissue, and interrupted staples on the skin.  Xeroform and well-padded sterile dressing was applied.  She was awakened, extubated, and taken to the recovery room in stable condition.  All final counts were correct.  There were no complications noted.  Of note, Erskine Emery, PA-C, assisted in the entire case.  His assistance was crucial for facilitating all aspects of this case.     Lind Guest. Ninfa Linden, M.D.   ______________________________ Lind Guest. Ninfa Linden, M.D.    CYB/MEDQ  D:  01/17/2017  T:  01/18/2017  Job:  [id number]

## 2017-01-19 NOTE — Progress Notes (Addendum)
NCM spoke to pt and dtr, Sharika at bedside. Pt states she had a sleep study a couple years ago and was told she needed a CPAP but never followed up on getting machine. Dtr states her oxygen levels have dropped while she sleeps. NCM notified Unit RN who contacted attending for orders. Pt will have overnight pulse oximetry to evaluate need for oxygen at night at home. Will have weekday NCM follow up with attending once results are complete to get oxygen ordered if needed at home. DME agency will need a copy of overnight pulse oximetry reports. If pt's sats drop below 88%, will arrange oxygen. Unit RN will notify RT to set up tonight. Jonnie Finner RN CCM Case Mgmt phone [phone]

## 2017-01-19 NOTE — Progress Notes (Signed)
Physical Therapy Treatment Patient Details Name: [name] MRN: [id number] DOB: 10/09/1961 Today's Date: 01/19/2017    History of Present Illness Pt s/p L TKR    PT Comments    POD # 2 pm session Pt still sleepy groggy with lunch tray infront of her untouched.  Assisted with amb a greater distance in hallway then assisted on/off BSC to void.  Pt not ready for D/C due.    Follow Up Recommendations  Home health PT     Equipment Recommendations  Rolling walker with 5" wheels    Recommendations for Other Services       Precautions / Restrictions Precautions Precautions: Knee;Fall Precaution Comments: instructed on KI use for amb and proper application Required Braces or Orthoses: Knee Immobilizer - Left Knee Immobilizer - Left: Discontinue once straight leg raise with < 10 degree lag Restrictions Weight Bearing Restrictions: No Other Position/Activity Restrictions: WBAT    Mobility  Bed Mobility Overal bed mobility: Needs Assistance Bed Mobility: Supine to Sit     Supine to sit: Min assist     General bed mobility comments: OOB in recliner  Transfers Overall transfer level: Needs assistance Equipment used: Rolling walker (2 wheeled) Transfers: Sit to/from Stand Sit to Stand: Min assist;Mod assist;+2 safety/equipment         General transfer comment: 75% VC's for direction and increased time due to level of groggyness.  Pt prefers to pull self up on walker due to BMI.     Assisted on/off BSC  Ambulation/Gait Ambulation/Gait assistance: Min assist;+2 safety/equipment Ambulation Distance (Feet): 64 Feet Assistive device: Rolling walker (2 wheeled) Gait Pattern/deviations: Step-to pattern;Decreased step length - right;Decreased step length - left;Shuffle;Wide base of support;Antalgic Gait velocity: decr   General Gait Details: 50% cues for sequence, posture, position from RW and increased UE WB     recliner following for safety   Stairs             Wheelchair Mobility    Modified Rankin (Stroke Patients Only)       Balance                                    Cognition Arousal/Alertness: Lethargic (groggy/sleepy) Behavior During Therapy: Flat affect                   General Comments: pt had iv pain meds and xanax--suspect lethargic due to meds    Exercises      General Comments        Pertinent Vitals/Pain Pain Assessment: 0-10 Pain Score: 3  Pain Location: L knee Pain Descriptors / Indicators: Discomfort;Grimacing;Operative site guarding Pain Intervention(s): Monitored during session;Repositioned;Ice applied;Premedicated before session    Home Living                      Prior Function            PT Goals (current goals can now be found in the care plan section) Progress towards PT goals: Progressing toward goals    Frequency    7X/week      PT Plan Current plan remains appropriate    Co-evaluation             End of Session Equipment Utilized During Treatment: Left knee immobilizer Activity Tolerance: Patient limited by lethargy Patient left: in chair;with call bell/phone within reach;with family/visitor present     Time: 1332-1400 PT  Time Calculation (min) (ACUTE ONLY): 28 min  Charges:  $Gait Training: 8-22 mins $Therapeutic Activity: 8-22 mins                    G Codes:      Rica Koyanagi  PTA [facility]  Acute  Rehab Pager      [phone]

## 2017-01-19 NOTE — Progress Notes (Signed)
Patient ID: [name], female   DOB: 1961-07-31, 56 y.o.   MRN: [id number] Patient is status post total knee arthroplasty. She is extremely somnolent today, at noon, she has difficulty holding her eyes open, most likely due to her dose of Xanax. She has not tried stairs yet. Plan for discharge to home tomorrow would hold on the Xanax.

## 2017-01-19 NOTE — Progress Notes (Signed)
Pt has home Albuterol MDI at bedside. Wants MD order to be able to use as needed. RT will continue to monitor.

## 2017-01-19 NOTE — Progress Notes (Signed)
Physical Therapy Treatment Patient Details Name: [name] MRN: [id number] DOB: 05-09-1961 Today's Date: 01/19/2017    History of Present Illness Pt s/p L TKR    PT Comments    POD # 2 am session Assisted pt OOB to amb required + 2 assist due to pt's grogginess (meds?)  Required MAX encouragement to stay awake enough to participate.  Positioned in built up seat recliner and applied ICE to knee.   Follow Up Recommendations  Home health PT     Equipment Recommendations  Rolling walker with 5" wheels    Recommendations for Other Services       Precautions / Restrictions Precautions Precautions: Knee;Fall Precaution Comments: instructed on KI use for amb and proper application Required Braces or Orthoses: Knee Immobilizer - Left Knee Immobilizer - Left: Discontinue once straight leg raise with < 10 degree lag Restrictions Weight Bearing Restrictions: No Other Position/Activity Restrictions: WBAT    Mobility  Bed Mobility Overal bed mobility: Needs Assistance Bed Mobility: Supine to Sit     Supine to sit: Min assist     General bed mobility comments: 75 % cues for sequence and assist with LLE  mainly due to level of groggyness  Transfers Overall transfer level: Needs assistance Equipment used: Rolling walker (2 wheeled) Transfers: Sit to/from Stand Sit to Stand: Min assist;Mod assist;+2 safety/equipment         General transfer comment: 75% VC's for direction and increased time due to level of groggyness.  Pt prefers to pull self up on walker due to BMI.  Ambulation/Gait Ambulation/Gait assistance: Min assist;+2 safety/equipment Ambulation Distance (Feet): 57 Feet Assistive device: Rolling walker (2 wheeled) Gait Pattern/deviations: Step-to pattern;Decreased step length - right;Decreased step length - left;Shuffle;Wide base of support;Antalgic Gait velocity: decr   General Gait Details: 50% cues for sequence, posture, position from RW and increased UE WB      recliner following for safety   Stairs            Wheelchair Mobility    Modified Rankin (Stroke Patients Only)       Balance                                    Cognition Arousal/Alertness: Lethargic (groggy/sleepy) Behavior During Therapy: Flat affect                   General Comments: pt had iv pain meds and xanax--suspect lethargic due to meds    Exercises      General Comments        Pertinent Vitals/Pain Pain Assessment: 0-10 Pain Score: 3  Pain Location: L knee Pain Descriptors / Indicators: Discomfort;Grimacing;Operative site guarding Pain Intervention(s): Monitored during session;Repositioned;Ice applied;Premedicated before session    Home Living                      Prior Function            PT Goals (current goals can now be found in the care plan section) Progress towards PT goals: Progressing toward goals    Frequency    7X/week      PT Plan Current plan remains appropriate    Co-evaluation             End of Session Equipment Utilized During Treatment: Left knee immobilizer Activity Tolerance: Patient limited by lethargy Patient left: in chair;with call bell/phone within reach;with family/visitor  present     Time: 1122-1150 PT Time Calculation (min) (ACUTE ONLY): 28 min  Charges:  $Gait Training: 8-22 mins $Therapeutic Activity: 8-22 mins                    G Codes:      Rica Koyanagi  PTA [facility]  Acute  Rehab Pager      [phone]

## 2017-01-19 NOTE — Evaluation (Signed)
Occupational Therapy Evaluation Patient Details Name: [name] MRN: [id number] DOB: November 14, 1961 Today's Date: 01/19/2017    History of Present Illness Pt s/p L TKR   Clinical Impression   This 56 year old female was admitted for the above sx.  She will benefit from continued OT in acute setting. Pt currently needs min to mod A for sit to stand during transfers and adls and up to max A for LB adls.     Follow Up Recommendations  Supervision/Assistance - 24 hour    Equipment Recommendations  3 in 1 bedside commode;Tub/shower bench (delivered:  tub bench may need to be bariatric; wide 3:1)    Recommendations for Other Services       Precautions / Restrictions Precautions Precautions: Knee;Fall Required Braces or Orthoses: Knee Immobilizer - Left Knee Immobilizer - Left: Discontinue once straight leg raise with < 10 degree lag Restrictions Weight Bearing Restrictions: No Other Position/Activity Restrictions: WBAT      Mobility Bed Mobility         Supine to sit: Min assist Sit to supine: Min assist   General bed mobility comments: cues for sequence and assist with LLE  Transfers   Equipment used: Rolling walker (2 wheeled) Transfers: Sit to/from Stand Sit to Stand: Min assist;Mod assist;+2 safety/equipment         General transfer comment: cues for UE/LE placement.  More assistance from 3:1 than raised bed. Daughter, Maudie Mercury, RN stood by as second person    Balance                                            ADL Overall ADL's : Needs assistance/impaired     Grooming: Set up;Sitting;Wash/dry Geophysical data processor Transfer: Minimal assistance;Moderate assistance;Stand-pivot;BSC;RW   Toileting- Clothing Manipulation and Hygiene: Moderate assistance;Sit to/from stand         General ADL Comments: SPT to 3:1 commode.  More assistance from commode due to lower surface.  Daughters will help with adls, but pt wants to  maximize participation:  issued reacher and long sponge ('caid is secondary insurance).  Pt currently can perform UB adls with set up and she needs max A for LB adls.     Vision     Perception     Praxis      Pertinent Vitals/Pain Pain Score: 4  Pain Location: L knee Pain Descriptors / Indicators: Aching;Sore Pain Intervention(s): Limited activity within patient's tolerance;Monitored during session;Premedicated before session;Repositioned     Hand Dominance     Extremity/Trunk Assessment Upper Extremity Assessment Upper Extremity Assessment: Overall WFL for tasks assessed           Communication Communication Communication: No difficulties   Cognition Arousal/Alertness: Lethargic Behavior During Therapy: WFL for tasks assessed/performed Overall Cognitive Status: Within Functional Limits for tasks assessed                 General Comments: pt had iv pain meds and xanax--suspect lethargic due to meds   General Comments       Exercises       Shoulder Instructions      Home Living Family/patient expects to be discharged to:: Private residence Living Arrangements: Children  Bathroom Shower/Tub: Risk analyst characteristics: Architectural technologist: Standard         Additional Comments: 3:1 was delivered      Prior Functioning/Environment Level of Independence: Independent                 OT Problem List: Decreased strength;Decreased activity tolerance;Decreased knowledge of use of DME or AE;Decreased knowledge of precautions;Pain   OT Treatment/Interventions: Self-care/ADL training;DME and/or AE instruction;Patient/family education    OT Goals(Current goals can be found in the care plan section) Acute Rehab OT Goals Patient Stated Goal: Regain IND OT Goal Formulation: With patient Time For Goal Achievement: 01/26/17 Potential to Achieve Goals: Good ADL Goals Pt Will Perform Lower Body Bathing: with min  assist;sit to/from stand;with adaptive equipment Pt Will Perform Lower Body Dressing: with mod assist;with adaptive equipment;sit to/from stand (reacher, underwear) Pt Will Transfer to Toilet: with min assist;bedside commode;ambulating Pt Will Perform Tub/Shower Transfer: Tub transfer;ambulating;tub bench;with min assist  OT Frequency: Min 2X/week   Barriers to D/C:            Co-evaluation              End of Session Nurse Communication: Patient requests pain meds  Activity Tolerance: Patient limited by fatigue;Patient limited by lethargy Patient left: in bed;with call bell/phone within reach;with family/visitor present   Time: [id number] OT Time Calculation (min): 31 min Charges:  OT General Charges $OT Visit: 1 Procedure OT Evaluation $OT Eval Low Complexity: 1 Procedure OT Treatments $Self Care/Home Management : 8-22 mins G-Codes:    Delando Satter Feb 01, 2017, 11:38 AM Lesle Chris, OTR/L [phone] 2017/02/01

## 2017-01-20 ENCOUNTER — Encounter (HOSPITAL_COMMUNITY): Payer: Self-pay | Admitting: Orthopaedic Surgery

## 2017-01-20 MED ORDER — PHENOBARBITAL 32.4 MG PO TABS: 32.4000 mg | ORAL_TABLET | Freq: Every day | ORAL | Status: DC

## 2017-01-20 MED ORDER — HYDROMORPHONE HCL 2 MG PO TABS: 2.0000 mg | ORAL_TABLET | ORAL | 0 refills | Status: DC | PRN

## 2017-01-20 MED ORDER — CYCLOBENZAPRINE HCL 5 MG PO TABS: 5.0000 mg | ORAL_TABLET | Freq: Three times a day (TID) | ORAL | 0 refills | Status: DC | PRN

## 2017-01-20 MED ORDER — RIVAROXABAN 10 MG PO TABS: 10.0000 mg | ORAL_TABLET | Freq: Every day | ORAL | 0 refills | Status: DC

## 2017-01-20 NOTE — Progress Notes (Signed)
Physical Therapy Treatment Patient Details Name: [name] MRN: [id number] DOB: 1961/11/17 Today's Date: 01/20/2017    History of Present Illness Pt s/p L TKR    PT Comments    The patient is progressing well. ready for DC.  Follow Up Recommendations  Home health PT     Equipment Recommendations  Rolling walker with 5" wheels    Recommendations for Other Services       Precautions / Restrictions Precautions Precautions: Knee;Fall Precaution Comments: instructed on KI use for amb and proper application Required Braces or Orthoses: Knee Immobilizer - Left Knee Immobilizer - Left: Discontinue once straight leg raise with < 10 degree lag Restrictions Weight Bearing Restrictions: No Other Position/Activity Restrictions: WBAT    Mobility  Bed Mobility Overal bed mobility: Needs Assistance Bed Mobility: Supine to Sit     Supine to sit: Min assist     General bed mobility comments: OOB in recliner  Transfers Overall transfer level: Needs assistance Equipment used: Rolling walker (2 wheeled) Transfers: Sit to/from [facility] Sit to Stand: Min assist Stand pivot transfers: Min assist          Ambulation/Gait Ambulation/Gait assistance: Min assist Ambulation Distance (Feet): 40 Feet Assistive device: Rolling walker (2 wheeled) Gait Pattern/deviations: Step-to pattern;Decreased step length - right;Decreased step length - left;Shuffle;Wide base of support;Antalgic     General Gait Details: 50% cues for sequence, posture, position from RW and increased UE WB     recliner following for safety   Stairs            Wheelchair Mobility    Modified Rankin (Stroke Patients Only)       Balance                                    Cognition Arousal/Alertness: Lethargic;Suspect due to medications Behavior During Therapy: [facility] for tasks assessed/performed Overall Cognitive Status: Within Functional Limits for tasks  assessed                      Exercises Total Joint Exercises Ankle Circles/Pumps: AROM;Both;15 reps;Supine Quad Sets: AROM;Both;10 reps;Supine Heel Slides: AAROM;Left;10 reps;Supine Hip ABduction/ADduction: AAROM;Left;10 reps;Supine Straight Leg Raises: AAROM;Left;10 reps Goniometric ROM: 10-40 left knee    General Comments        Pertinent Vitals/Pain Pain Assessment: 0-10 Pain Score: 7  Pain Location: L knee Pain Descriptors / Indicators: Discomfort;Grimacing;Operative site guarding Pain Intervention(s): Premedicated before session;Repositioned;Patient requesting pain meds-RN notified    Home Living                      Prior Function            PT Goals (current goals can now be found in the care plan section) Acute Rehab PT Goals Patient Stated Goal: Regain IND Progress towards PT goals: Progressing toward goals    Frequency    7X/week      PT Plan Current plan remains appropriate    Co-evaluation             End of Session Equipment Utilized During Treatment: Left knee immobilizer   Patient left: in chair;with call bell/phone within reach;with family/visitor present     Time: 0925-1020 PT Time Calculation (min) (ACUTE ONLY): 55 min  Charges:  $Gait Training: 8-22 mins $Therapeutic Exercise: 8-22 mins $Self Care/Home Management: 8-22  G Codes:      Claretha Cooper 01/20/2017, 1:23 PM

## 2017-01-20 NOTE — Progress Notes (Signed)
RT placed pulse oximeter on patients finger for overnight testing to see if patient needs to have oxygen at home. Patient was on oxygen but is now on .21% room air. Beginning oxygen saturation was 99% on room air and HR was 85. RT will continue to monitor.

## 2017-01-20 NOTE — Discharge Summary (Signed)
Patient ID: [name] MRN: [id number] DOB/AGE: [birth year] 56 y.o.  Admit date: 01/17/2017 Discharge date: 01/20/2017  Admission Diagnoses:  Principal Problem:   Unilateral primary osteoarthritis, left knee Active Problems:   Status post total left knee replacement   Discharge Diagnoses:  Same  Past Medical History:  Diagnosis Date  . Anxiety   . Asthma   . Degenerative disc disease   . Degenerative disc disease, cervical   . Degenerative disc disease, cervical   . Headache    regular  . Hypertension   . Sleep apnea    mild, patient does not use mask    Surgeries: Procedure(s): LEFT TOTAL KNEE ARTHROPLASTY on 01/17/2017   Consultants:   Discharged Condition: Improved  Hospital Course: [name] is an 56 y.o. female who was admitted 01/17/2017 for operative treatment ofUnilateral primary osteoarthritis, left knee. Patient has severe unremitting pain that affects sleep, daily activities, and work/hobbies. After pre-op clearance the patient was taken to the operating room on 01/17/2017 and underwent  Procedure(s): LEFT TOTAL KNEE ARTHROPLASTY.    Patient was given perioperative antibiotics: Anti-infectives    Start     Dose/Rate Route Frequency Ordered Stop   01/17/17 2000  ceFAZolin (ANCEF) IVPB 2g/100 mL premix     2 g 200 mL/hr over 30 Minutes Intravenous Every 6 hours 01/17/17 1651 01/18/17 0236   01/17/17 0600  ceFAZolin (ANCEF) 3 g in dextrose 5 % 50 mL IVPB     3 g 130 mL/hr over 30 Minutes Intravenous On call to O.R. 01/16/17 1255 01/17/17 1316       Patient was given sequential compression devices, early ambulation, and chemoprophylaxis to prevent DVT.  Patient benefited maximally from hospital stay and there were no complications.    Recent vital signs: Patient Vitals for the past 24 hrs:  BP Temp Temp src Pulse Resp SpO2  01/20/17 0623 119/65 99.4 F (37.4 C) Oral 82 15 94 %  01/19/17 2320 - - - - - 96 %  01/19/17 2101 108/90 98.8 F (37.1 C)  Oral 76 15 100 %  01/19/17 1500 (!) 121/51 98.4 F (36.9 C) Oral 72 15 100 %  01/19/17 1359 - - - - - 94 %  01/19/17 0945 125/62 - - 69 - -     Recent laboratory studies:  Recent Labs  01/18/17 0613  WBC 12.4*  HGB 11.4*  HCT 32.0*  PLT 356  NA 134*  K 3.7  CL 101  CO2 24  BUN 16  CREATININE 0.73  GLUCOSE 138*  CALCIUM 8.3*     Discharge Medications:   Allergies as of 01/20/2017   No Known Allergies     Medication List    STOP taking these medications   Oxycodone HCl 20 MG Tabs   PHENObarbital 30 MG tablet Commonly known as:  LUMINAL     TAKE these medications   albuterol (2.5 MG/3ML) 0.083% nebulizer solution Commonly known as:  PROVENTIL Take 2.5 mg by nebulization every 6 (six) hours as needed for wheezing or shortness of breath.   albuterol 108 (90 Base) MCG/ACT inhaler Commonly known as:  PROVENTIL HFA;VENTOLIN HFA Inhale 2 puffs into the lungs every 6 (six) hours as needed for shortness of breath.   ALPRAZolam 1 MG tablet Commonly known as:  XANAX Take 1 mg by mouth 2 (two) times daily as needed for anxiety. For anxiety   amLODipine 5 MG tablet Commonly known as:  NORVASC Take 5 mg by mouth daily.  atenolol 50 MG tablet Commonly known as:  TENORMIN Take 50 mg by mouth daily.   cetirizine 10 MG tablet Commonly known as:  ZYRTEC Take 10 mg by mouth daily as needed for allergies.   cyclobenzaprine 5 MG tablet Commonly known as:  FLEXERIL Take 1 tablet (5 mg total) by mouth 3 (three) times daily as needed for muscle spasms.   diclofenac sodium 1 % Gel Commonly known as:  VOLTAREN Apply 2 g topically daily as needed (PAIN).   furosemide 20 MG tablet Commonly known as:  LASIX Take 20 mg by mouth daily as needed for fluid or edema.   HYDROmorphone 2 MG tablet Commonly known as:  DILAUDID Take 1 tablet (2 mg total) by mouth every 4 (four) hours as needed for severe pain.   ibuprofen 800 MG tablet Commonly known as:  ADVIL,MOTRIN Take 800  mg by mouth 3 (three) times daily as needed for moderate pain.   losartan-hydrochlorothiazide 100-25 MG tablet Commonly known as:  HYZAAR Take 1 tablet by mouth daily.   phentermine 30 MG capsule Take 30 mg by mouth every morning.   QVAR 80 MCG/ACT inhaler Generic drug:  beclomethasone Inhale 2 puffs into the lungs 2 (two) times daily.   rivaroxaban 10 MG Tabs tablet Commonly known as:  XARELTO Take 1 tablet (10 mg total) by mouth daily with breakfast.            Durable Medical Equipment        Start     Ordered   01/18/17 1055  For home use only DME 3 n 1  Once    Comments:  bariatric   01/18/17 1056   01/18/17 1054  For home use only DME Tub bench  Once    Comments:  bariatric   01/18/17 1056   01/18/17 1054  For home use only DME Walker rolling  Once    Comments:  bariatric  Question:  Patient needs a walker to treat with the following condition  Answer:  S/P total knee arthroplasty, left   01/18/17 1056   01/17/17 1652  DME 3 n 1  Once     01/17/17 1651   01/17/17 1652  DME Walker rolling  Once    Question:  Patient needs a walker to treat with the following condition  Answer:  Status post total left knee replacement   01/17/17 1651      Diagnostic Studies: Dg Knee Left Port  Result Date: 01/17/2017 CLINICAL DATA:  LEFT knee replacement EXAM: PORTABLE LEFT KNEE - 1-2 VIEW COMPARISON:  None. FINDINGS: Total knee arthroplasty. Skin sutures noted. No fracture or dislocation. IMPRESSION: No complication following total knee arthroplasty. Electronically Signed   By: Suzy Bouchard M.D.   On: 01/17/2017 16:02    Disposition: 01-Home or Self Care  Discharge Instructions    Discharge patient    Complete by:  As directed    Discharge disposition:  01-Home or Self Care   Discharge patient date:  01/20/2017      Follow-up Information    [name] Follow up.   Specialty:  Home Health Services Why:  Home Health Physical Therapy Contact information: [place] [address] [phone]        Mcarthur Rossetti, MD Follow up in 2 week(s).   Specialty:  Orthopedic Surgery Contact information: [place] [address] [phone]            Signed: Harrell Gave  [place] 01/20/2017, 7:29 AM

## 2017-01-20 NOTE — Progress Notes (Signed)
Patient ID: [name], female   DOB: 12-23-1960, 56 y.o.   MRN: [id number] No acute changes.  Can be discharged to home today.

## 2017-01-20 NOTE — Discharge Instructions (Signed)

## 2017-01-20 NOTE — Progress Notes (Signed)
Occupational Therapy Treatment Patient Details Name: ANNELISA WHITEHILL MRN: [id number] DOB: Jan 28, 1961 Today's Date: 01/20/2017    History of present illness Pt s/p L TKR   OT comments    Follow Up Recommendations  Supervision/Assistance - 24 hour    Equipment Recommendations  3 in 1 bedside commode;Tub/shower bench (delivered:  tub bench may need to be bariatric; wide 3:1)    Recommendations for Other Services      Precautions / Restrictions Precautions Precautions: Knee;Fall Precaution Comments: instructed on KI use for amb and proper application Required Braces or Orthoses: Knee Immobilizer - Left Knee Immobilizer - Left: Discontinue once straight leg raise with < 10 degree lag Restrictions Weight Bearing Restrictions: No Other Position/Activity Restrictions: WBAT       Mobility Bed Mobility               General bed mobility comments: OOB in recliner  Transfers Overall transfer level: Needs assistance Equipment used: Rolling walker (2 wheeled) Transfers: Sit to/from [facility] Sit to Stand: Min assist Stand pivot transfers: Min assist                ADL Overall ADL's : Needs assistance/impaired Eating/Feeding: Set up;Sitting   Grooming: Set up;Sitting;Wash/dry hands   Upper Body Bathing: Set up;Sitting               Toilet Transfer: Minimal assistance;RW;Ambulation;Comfort height toilet   Toileting- Clothing Manipulation and Hygiene: Minimal assistance;Sit to/from stand;Cueing for safety;Cueing for sequencing     Tub/Shower Transfer Details (indicate cue type and reason): verbalized safety with tub bench                    Cognition   Behavior During Therapy: [facility] for tasks assessed/performed Overall Cognitive Status: Within Functional Limits for tasks assessed                               General Comments  daughters present for OT session    Pertinent Vitals/ Pain       Pain Score: 3  Pain  Location: L knee Pain Intervention(s): Monitored during session;Repositioned         Frequency  Min 2X/week        Progress Toward Goals  OT Goals(current goals can now be found in the care plan section)  Progress towards OT goals: Progressing toward goals  Acute Rehab OT Goals Patient Stated Goal: [place] Discharge plan remains appropriate    Co-evaluation                 End of Session Equipment Utilized During Treatment: Rolling walker   Activity Tolerance Patient limited by fatigue;Patient limited by lethargy   Patient Left in bed;with call bell/phone within reach;with family/visitor present   Nurse Communication Patient requests pain meds        Time: [id number] OT Time Calculation (min): 22 min  Charges: OT General Charges $OT Visit: 1 Procedure OT Treatments $Self Care/Home Management : 8-22 mins  Don Giarrusso, Edwena Felty D 01/20/2017, 12:24 PM

## 2017-01-20 NOTE — Addendum Note (Signed)
Addendum  created 01/20/17 [id number] by Lollie Sails, CRNA   Charge Capture section accepted

## 2017-01-21 ENCOUNTER — Telehealth (INDEPENDENT_AMBULATORY_CARE_PROVIDER_SITE_OTHER): Payer: Self-pay | Admitting: Orthopaedic Surgery

## 2017-01-21 NOTE — Telephone Encounter (Signed)
Charisse from [facility] called for a verbal order for Patient [name] for PT of 3x a week for 1 week and 2x a week for 1 week.  [id number].  Thank You.

## 2017-01-21 NOTE — Telephone Encounter (Signed)
Therapist states patient is wearing a knee immbolizer stating she wastold to do so???

## 2017-01-21 NOTE — Telephone Encounter (Signed)
She should stop the knee immobilizer at this standpoint.

## 2017-01-22 NOTE — Telephone Encounter (Signed)
LMOM for [name] of the below message from [facility]

## 2017-01-24 ENCOUNTER — Telehealth (INDEPENDENT_AMBULATORY_CARE_PROVIDER_SITE_OTHER): Payer: Self-pay | Admitting: Orthopaedic Surgery

## 2017-01-24 NOTE — Telephone Encounter (Signed)
Pt PT said she accidentally took 2 tablets of blood thinner yesterday and feels light headed so pt didn't take any today. Wants to know if that's okay.  [place]

## 2017-01-24 NOTE — Telephone Encounter (Signed)
Patient states the Dilaudid isn't helping her; she states the Ibuprofen helps her so much more. She is asking if she can just take Ibuprofen? (she hasn't because of the blood thinner)

## 2017-01-24 NOTE — Telephone Encounter (Signed)
Patient aware of the below message  

## 2017-01-24 NOTE — Telephone Encounter (Signed)
Have her just stop her blood thinner and start taking ibuprofen tomorrow

## 2017-01-24 NOTE — Telephone Encounter (Signed)
PT ASKED IF WE CAN SEND A RX FOR SOMETHING THAT SHE CAN USE TO CHECK HER Bp. REQUESTED A CALL BACK AT [phone]

## 2017-01-30 ENCOUNTER — Ambulatory Visit (INDEPENDENT_AMBULATORY_CARE_PROVIDER_SITE_OTHER): Payer: Medicare Other | Admitting: Orthopaedic Surgery

## 2017-01-30 DIAGNOSIS — Z96652 Presence of left artificial knee joint: Secondary | ICD-10-CM

## 2017-01-30 MED ORDER — OXYCODONE-ACETAMINOPHEN 10-325 MG PO TABS: 1.0000 | ORAL_TABLET | ORAL | 0 refills | Status: DC | PRN

## 2017-01-30 NOTE — Progress Notes (Signed)
The patient is 2 weeks status post a left total knee replacement. She is doing well. However she has notes from physical therapy state that her range of motion is -20 to only 65. On exam her left knee incisions well-healed remove the staples and placed her shoes. Her calf is soft. Her range of motion is significantly limited.  A starter length about the need to get aggressive with her motion. I gave her prescription for outpatient physical therapy. I did refill her narcotic pain medications which we will keep her on for the short-term since she is under long-term pain management with another office and has a contract with them. They understand that we will take care of her short-term pain needs. I do want her to stop her blood thinning medication. However get back on her 800 mg of ibuprofen 3 times a day with meals starting Saturday. We'll see her back in a month to see how she doing overall but no x-rays are needed.

## 2017-02-06 ENCOUNTER — Ambulatory Visit: Payer: Medicare Other

## 2017-02-10 ENCOUNTER — Ambulatory Visit: Payer: Medicare Other | Attending: Orthopaedic Surgery

## 2017-02-10 DIAGNOSIS — R600 Localized edema: Secondary | ICD-10-CM | POA: Diagnosis present

## 2017-02-10 DIAGNOSIS — M6281 Muscle weakness (generalized): Secondary | ICD-10-CM | POA: Insufficient documentation

## 2017-02-10 DIAGNOSIS — R262 Difficulty in walking, not elsewhere classified: Secondary | ICD-10-CM | POA: Diagnosis present

## 2017-02-10 DIAGNOSIS — M25662 Stiffness of left knee, not elsewhere classified: Secondary | ICD-10-CM | POA: Diagnosis present

## 2017-02-10 DIAGNOSIS — Z96652 Presence of left artificial knee joint: Secondary | ICD-10-CM | POA: Diagnosis not present

## 2017-02-10 DIAGNOSIS — M25562 Pain in left knee: Secondary | ICD-10-CM | POA: Diagnosis present

## 2017-02-10 NOTE — Therapy (Signed)
[place], [place], [address] Phone: [phone]   Fax:  [phone]  Physical Therapy Evaluation  Patient Details  Name: [name] MRN: [id number] Date of Birth: 1960-12-21 Referring Provider: Jean Rosenthal, MD  Encounter Date: 02/10/2017      PT End of Session - 02/10/17 0940    Visit Number 1   Number of Visits 28   Date for PT Re-Evaluation 04/18/17   Authorization Type Medicare   PT Start Time 0940   PT Stop Time 1015   PT Time Calculation (min) 35 min   Activity Tolerance Patient tolerated treatment well;No increased pain   Behavior During Therapy WFL for tasks assessed/performed      Past Medical History:  Diagnosis Date  . Anxiety   . Asthma   . Degenerative disc disease   . Degenerative disc disease, cervical   . Degenerative disc disease, cervical   . Headache    regular  . Hypertension   . Sleep apnea    mild, patient does not use mask    Past Surgical History:  Procedure Laterality Date  . BACK SURGERY    . NOVASURE ABLATION    . TOTAL KNEE ARTHROPLASTY Left 01/17/2017   Procedure: LEFT TOTAL KNEE ARTHROPLASTY;  Surgeon: Mcarthur Rossetti, MD;  Location: [facility];  Service: Orthopedics;  Laterality: Left;  . TUBAL LIGATION      There were no vitals filed for this visit.       Subjective Assessment - 02/10/17 0942    Subjective Post LT TKA due to OA.  She received HHPT for 5 visits and was discharged to outpatient.   She reports pain and stiffness.   She is walking with walker  almost all time.    Pertinent History cervical discectomy.    Limitations Standing;Walking;House hold activities  sleeps in recliner   How long can you sit comfortably? As needed   How long can you stand comfortably? < 5 min   How long can you walk comfortably? 5 min   Patient Stated Goals She wants to walk without walker , Return to home management, walk up  steps     Currently in  Pain? Yes   Pain Score 2    Pain Location Knee   Pain Orientation Left   Pain Descriptors / Indicators Aching   Pain Type Surgical pain   Pain Onset 1 to 4 weeks ago   Pain Frequency Constant   Aggravating Factors  lying in bed , bending , weight bearing   Pain Relieving Factors cold at least 1x/day   Multiple Pain Sites No            [facility] PT Assessment - 02/10/17 0001      Assessment   Medical Diagnosis LT TKA    Referring Provider Jean Rosenthal, MD   Onset Date/Surgical Date 01/17/17   Next MD Visit 02/27/17   Prior Therapy HHPT     Precautions   Precautions Fall   Precaution Comments walking with walker for safety     Restrictions   Weight Bearing Restrictions No     Balance Screen   Has the patient fallen in the past 6 months Yes   How many times? 10x  prior to surgery , no device usage   Has the patient had a decrease in activity level because of a fear of falling?  Yes  due to surgery   Is the patient reluctant to leave  their home because of a fear of falling?  No     Home Social worker Private residence   Living Arrangements Non-relatives/Friends;Children   Type of Home Apartment   Home Access Level entry   Home Layout Multi-level  she stays on 1st floor     Prior Function   Level of Independence Requires assistive device for independence   Vocation On disability     Cognition   Overall Cognitive Status Within Functional Limits for tasks assessed     Observation/Other Assessments-Edema    Edema Circumferential     Circumferential Edema   Circumferential - Right at patella 57 cm   Circumferential - Left  at patella 60 cm.      ROM / Strength   AROM / PROM / Strength AROM;Strength;PROM     AROM   AROM Assessment Site Knee   Right/Left Knee Right;Left   Left Knee Extension -24   Left Knee Flexion 86     PROM   PROM Assessment Site Knee   Right/Left Knee Left   Left Knee Extension -15   Left Knee Flexion 93      Strength   Strength Assessment Site Knee   Right/Left Knee Right;Left   Right Knee Flexion 5/5   Right Knee Extension 5/5   Left Knee Flexion 4/5   Left Knee Extension 4/5     Flexibility   Soft Tissue Assessment /Muscle Length yes     Ambulation/Gait   Gait Comments RW decreased weight to LT leg and dcreased flexion and extension with swing phase                           PT Education - 02/10/17 1024    Education provided Yes   Education Details POC Benefits of prolonged stretching    Person(s) Educated Patient   Methods Explanation   Comprehension Verbalized understanding          PT Short Term Goals - 02/10/17 1017      PT SHORT TERM GOAL #1   Title She will be independent with inital HEP    Time 3   Period Weeks   Status New     PT SHORT TERM GOAL #2   Title She will demo active LT knee flexion to 105 degrees   Time 4   Period Weeks   Status New     PT SHORT TERM GOAL #3   Title she will have active -15 degrees extension   Time 4   Period Weeks   Status New           PT Long Term Goals - 02/10/17 1021      PT LONG TERM GOAL #1   Title She will be independnet with all HEP issued    Time 12   Period Weeks   Status New     PT LONG TERM GOAL #2   Title She will report pain as intermittant   Time 12   Period Weeks   Status New     PT LONG TERM GOAL #3   Title She will walk without device in and out of home.    Time 12   Period Weeks   Status New     PT LONG TERM GOAL #4   Title She will be able to walk stairs step over step with use of railings   Time 12   Period Weeks   Status  New     PT LONG TERM GOAL #5   Title She will report sleeping in bed.    Time 12   Period Weeks   Status New     Additional Long Term Goals   Additional Long Term Goals Yes     PT LONG TERM GOAL #6   Title She will demo LT knee extension from -5 degrees to 110 degrees flexion to improve gait and weight bearing tolerance and aid in sleep    Time 12   Period Weeks   Status New               Plan - 02/10/17 1015    Clinical Impression Statement [name] presents for low complexity eval post TKA LT with swelling , stiffness, pain and weakness limiting mobility. She appears to have some increased flexion compared to last MD note.    Rehab Potential Good   PT Frequency 2x / week   PT Duration 12 weeks   PT Treatment/Interventions Cryotherapy;Electrical Stimulation;Stair training;Passive range of motion;Patient/family education;Taping;Manual techniques;Therapeutic exercise;Therapeutic activities;Vasopneumatic Device   PT Next Visit Plan Review HEP , manual for swelling pain and ROM.   Vaso.    PT Home Exercise Plan asked her to gently stretch for longer period os time and more frequently and to ice more.    Consulted and Agree with Plan of Care Patient      Patient will benefit from skilled therapeutic intervention in order to improve the following deficits and impairments:  Decreased range of motion, Difficulty walking, Pain, Decreased strength, Decreased activity tolerance, Increased edema  Visit Diagnosis: Status post total left knee replacement - Plan: PT plan of care cert/re-cert  Stiffness of left knee, not elsewhere classified - Plan: PT plan of care cert/re-cert  Muscle weakness (generalized) - Plan: PT plan of care cert/re-cert  Localized edema - Plan: PT plan of care cert/re-cert  Difficulty in walking, not elsewhere classified - Plan: PT plan of care cert/re-cert  Left knee pain, unspecified chronicity - Plan: PT plan of care cert/re-cert      G-Codes - [id number]    Functional Assessment Tool Used (Outpatient Only) FOTO 55% limited   Functional Limitation Mobility: Walking and moving around   Mobility: Walking and Moving Around Current Status [id number]) At least 40 percent but less than 60 percent impaired, limited or restricted   Mobility: Walking and Moving Around Goal Status [phone]) At least 20  percent but less than 40 percent impaired, limited or restricted       Problem List Patient Active Problem List   Diagnosis Date Noted  . Status post total left knee replacement 01/17/2017  . Unilateral primary osteoarthritis, left knee 12/05/2016  . Degenerative disc disease, cervical     Darrel Hoover  PT 02/10/2017, 10:27 AM  [facility] [address] [place], [place], [address] Phone: [phone]   Fax:  [phone]  Name: [name] MRN: [id number] Date of Birth: 16-Nov-1961

## 2017-02-13 ENCOUNTER — Ambulatory Visit: Payer: Medicare Other | Attending: Orthopaedic Surgery | Admitting: Physical Therapy

## 2017-02-13 DIAGNOSIS — M25662 Stiffness of left knee, not elsewhere classified: Secondary | ICD-10-CM | POA: Diagnosis present

## 2017-02-13 DIAGNOSIS — M6281 Muscle weakness (generalized): Secondary | ICD-10-CM | POA: Diagnosis present

## 2017-02-13 DIAGNOSIS — M25562 Pain in left knee: Secondary | ICD-10-CM | POA: Diagnosis present

## 2017-02-13 DIAGNOSIS — R600 Localized edema: Secondary | ICD-10-CM | POA: Insufficient documentation

## 2017-02-13 DIAGNOSIS — R262 Difficulty in walking, not elsewhere classified: Secondary | ICD-10-CM | POA: Diagnosis present

## 2017-02-13 DIAGNOSIS — Z96652 Presence of left artificial knee joint: Secondary | ICD-10-CM | POA: Insufficient documentation

## 2017-02-14 NOTE — Therapy (Signed)
[place], [place], [address] Phone: [phone]   Fax:  [phone]  Physical Therapy Treatment  Patient Details  Name: CATRIONA ARCANGEL MRN: [id number] Date of Birth: [birth year] Referring Provider: Jean Rosenthal, MD  Encounter Date: 02/13/2017      PT End of Session - 02/13/17 1307    Visit Number 2   Number of Visits 28   Date for PT Re-Evaluation 04/18/17   Authorization Type Medicare   PT Start Time 1300   PT Stop Time [id number]   PT Time Calculation (min) 53 min      Past Medical History:  Diagnosis Date  . Anxiety   . Asthma   . Degenerative disc disease   . Degenerative disc disease, cervical   . Degenerative disc disease, cervical   . Headache    regular  . Hypertension   . Sleep apnea    mild, patient does not use mask    Past Surgical History:  Procedure Laterality Date  . BACK SURGERY    . NOVASURE ABLATION    . TOTAL KNEE ARTHROPLASTY Left 01/17/2017   Procedure: LEFT TOTAL KNEE ARTHROPLASTY;  Surgeon: Mcarthur Rossetti, MD;  Location: [facility];  Service: Orthopedics;  Laterality: Left;  . TUBAL LIGATION      There were no vitals filed for this visit.      Subjective Assessment - 02/13/17 1306    Subjective Only using walker when I come out of the house    Currently in Pain? Yes   Pain Score 2    Pain Location Knee   Pain Orientation Left   Pain Descriptors / Indicators Aching            [facility] PT Assessment - 02/14/17 0001      AROM   Left Knee Flexion 95                     [facility] Adult PT Treatment/Exercise - 02/14/17 0001      Ambulation/Gait   Ambulation/Gait Yes   Ambulation/Gait Assistance 5: Supervision   Ambulation Distance (Feet) 100 Feet   Assistive device Straight cane   Gait Pattern Step-to pattern;Step-through pattern   Ambulation Surface Level   Gait Comments Began gait training with SPC. Pt progressed from step to, to step  through gait pattern with good safety.      Knee/Hip Exercises: Supine   Quad Sets 10 reps   Short Arc Quad Sets 20 reps   Heel Slides 10 reps   Terminal Knee Extension 10 reps;AROM   Terminal Knee Extension Limitations with heel prop   Straight Leg Raises 10 reps   Straight Leg Raises Limitations with initial quad set      Modalities   Modalities Vasopneumatic     Vasopneumatic   Number Minutes Vasopneumatic  15 minutes   Vasopnuematic Location  Knee   Vasopneumatic Pressure Low   Vasopneumatic Temperature  32     Manual Therapy   Manual Therapy Soft tissue mobilization   Soft tissue mobilization massasge roller to left thigh also retro massage and lymph activation                  PT Short Term Goals - 02/10/17 1017      PT SHORT TERM GOAL #1   Title She will be independent with inital HEP    Time 3   Period Weeks   Status New  PT SHORT TERM GOAL #2   Title She will demo active LT knee flexion to 105 degrees   Time 4   Period Weeks   Status New     PT SHORT TERM GOAL #3   Title she will have active -15 degrees extension   Time 4   Period Weeks   Status New           PT Long Term Goals - 02/10/17 1021      PT LONG TERM GOAL #1   Title She will be independnet with all HEP issued    Time 12   Period Weeks   Status New     PT LONG TERM GOAL #2   Title She will report pain as intermittant   Time 12   Period Weeks   Status New     PT LONG TERM GOAL #3   Title She will walk without device in and out of home.    Time 12   Period Weeks   Status New     PT LONG TERM GOAL #4   Title She will be able to walk stairs step over step with use of railings   Time 12   Period Weeks   Status New     PT LONG TERM GOAL #5   Title She will report sleeping in bed.    Time 12   Period Weeks   Status New     Additional Long Term Goals   Additional Long Term Goals Yes     PT LONG TERM GOAL #6   Title She will demo LT knee extension from -5  degrees to 110 degrees flexion to improve gait and weight bearing tolerance and aid in sleep   Time 12   Period Weeks   Status New               Plan - 02/14/17 [id number]    Clinical Impression Statement Pt demonstrates improving AROM. Her biggest complaint is pain. Instructed pt is lymph activation, retrograde massage and soft tissue rolling using roller. Pt plans to purchase her own roller for home. Her pain is mostly vastus lateralis and she reported relief folllowing massage to this area. After manual pt achieved 95 degrees of left knee flexion AROM. We reviewed her HEP and performed a trial of vasopneumatic device for edema.    PT Next Visit Plan progress HEP- , manual for swelling pain and ROM.   assess vaso    PT Home Exercise Plan asked her to gently stretch for longer period os time and more frequently and to ice more. currently performing heel slides, SAQ, quad sets, SLR at home.    Consulted and Agree with Plan of Care Patient      Patient will benefit from skilled therapeutic intervention in order to improve the following deficits and impairments:  Decreased range of motion, Difficulty walking, Pain, Decreased strength, Decreased activity tolerance, Increased edema  Visit Diagnosis: Status post total left knee replacement  Stiffness of left knee, not elsewhere classified  Muscle weakness (generalized)  Localized edema  Difficulty in walking, not elsewhere classified  Left knee pain, unspecified chronicity     Problem List Patient Active Problem List   Diagnosis Date Noted  . Status post total left knee replacement 01/17/2017  . Unilateral primary osteoarthritis, left knee 12/05/2016  . Degenerative disc disease, cervical     Dorene Ar, PTA 02/14/2017, 7:49 AM  [place], [place], [address] Phone: [phone]   Fax:  [phone]  Name: JAQUANDA PIROLLI MRN:  [id number] Date of Birth: [birth year]

## 2017-02-17 ENCOUNTER — Ambulatory Visit: Payer: Medicare Other

## 2017-02-17 DIAGNOSIS — R262 Difficulty in walking, not elsewhere classified: Secondary | ICD-10-CM

## 2017-02-17 DIAGNOSIS — M6281 Muscle weakness (generalized): Secondary | ICD-10-CM

## 2017-02-17 DIAGNOSIS — R600 Localized edema: Secondary | ICD-10-CM

## 2017-02-17 DIAGNOSIS — Z96652 Presence of left artificial knee joint: Secondary | ICD-10-CM

## 2017-02-17 DIAGNOSIS — M25662 Stiffness of left knee, not elsewhere classified: Secondary | ICD-10-CM

## 2017-02-17 NOTE — Therapy (Signed)
[place], [place], [address] Phone: [phone]   Fax:  [phone]  Physical Therapy Treatment  Patient Details  Name: [name] MRN: [id number] Date of Birth: 12/01/1961 Referring Provider: Jean Rosenthal, MD  Encounter Date: 02/17/2017      PT End of Session - 02/17/17 1049    Visit Number 3   Number of Visits 28   Date for PT Re-Evaluation 04/18/17   Authorization Type Medicare   PT Start Time 0762   PT Stop Time 1107   PT Time Calculation (min) 53 min   Activity Tolerance Patient tolerated treatment well;No increased pain   Behavior During Therapy WFL for tasks assessed/performed      Past Medical History:  Diagnosis Date  . Anxiety   . Asthma   . Degenerative disc disease   . Degenerative disc disease, cervical   . Degenerative disc disease, cervical   . Headache    regular  . Hypertension   . Sleep apnea    mild, patient does not use mask    Past Surgical History:  Procedure Laterality Date  . BACK SURGERY    . NOVASURE ABLATION    . TOTAL KNEE ARTHROPLASTY Left 01/17/2017   Procedure: LEFT TOTAL KNEE ARTHROPLASTY;  Surgeon: Mcarthur Rossetti, MD;  Location: [facility];  Service: Orthopedics;  Laterality: Left;  . TUBAL LIGATION      There were no vitals filed for this visit.      Subjective Assessment - 02/17/17 1022    Subjective Can walk better (no device today) but pain limiting sleep. Pain worse at night   Currently in Pain? Yes   Pain Score 3    Pain Location Knee   Pain Orientation Left   Pain Descriptors / Indicators Aching   Pain Type Surgical pain   Pain Onset More than a month ago   Pain Frequency Constant   Aggravating Factors  PM , bending , stand /walk   Pain Relieving Factors cold , medication   Multiple Pain Sites No            [facility] PT Assessment - 02/17/17 0001      AROM   Left Knee Flexion 105                      [facility] Adult PT Treatment/Exercise - 02/17/17 0001      Knee/Hip Exercises: Seated   Long Arc Quad 15 reps   Long Arc Quad Limitations 10 sec      Knee/Hip Exercises: Supine   Quad Sets --   [employer] Limitations 30 reps 5 sec hold   Short Arc Quad Sets Left   Short Arc Quad Sets Limitations 30 reps 5 sec hold   Heel Slides 5 reps   Heel Slides Limitations 30 sec   Bridges Limitations 20   Straight Leg Raises Left;2 sets;10 reps   Straight Leg Raises Limitations with initial quad set      Knee/Hip Exercises: Sidelying   Hip ABduction 2 sets;Left;10 reps     Knee/Hip Exercises: Prone   Hamstring Curl 20 reps;2 seconds   Hamstring Curl Limitations LT     Vasopneumatic   Number Minutes Vasopneumatic  15 minutes   Vasopnuematic Location  Knee   Vasopneumatic Pressure Low   Vasopneumatic Temperature  32                  PT Short Term Goals -  02/17/17 1051      PT SHORT TERM GOAL #1   Title She will be independent with inital HEP    Status Achieved     PT SHORT TERM GOAL #2   Title She will demo active LT knee flexion to 105 degrees   Status Achieved     PT SHORT TERM GOAL #3   Title she will have active -15 degrees extension   Status On-going           PT Long Term Goals - 02/17/17 1024      PT LONG TERM GOAL #1   Title She will be independnet with all HEP issued    Status Achieved     PT LONG TERM GOAL #2   Title She will report pain as intermittant   Status On-going     PT LONG TERM GOAL #3   Title She will walk without device in and out of home.    Status Partially Met     PT LONG TERM GOAL #4   Title She will be able to walk stairs step over step with use of railings   Status On-going     PT LONG TERM GOAL #5   Title She will report sleeping in bed.    Status On-going     PT LONG TERM GOAL #6   Title She will demo LT knee extension from -5 degrees to 110 degrees flexion to improve gait and weight bearing tolerance and aid in sleep    Status On-going               Plan - 02/17/17 1049    Clinical Impression Statement Her ROM improved over 100 degrees and doing well with exercise but PM pain causing sleep issues . She other wise is doing well . Continue Vaso and start closed chain exercises   PT Treatment/Interventions Cryotherapy;Electrical Stimulation;Stair training;Passive range of motion;Patient/family education;Taping;Manual techniques;Therapeutic exercise;Therapeutic activities;Vasopneumatic Device   PT Next Visit Plan progress HEP- , manual for swelling pain and ROM.   vaso , closed chain as tolerated   PT Home Exercise Plan asked her to gently stretch for longer period os time and more frequently and to ice more. currently performing heel slides, SAQ, quad sets, SLR at home.    Consulted and Agree with Plan of Care Patient      Patient will benefit from skilled therapeutic intervention in order to improve the following deficits and impairments:  Decreased range of motion, Difficulty walking, Pain, Decreased strength, Decreased activity tolerance, Increased edema  Visit Diagnosis: Status post total left knee replacement  Stiffness of left knee, not elsewhere classified  Muscle weakness (generalized)  Localized edema  Difficulty in walking, not elsewhere classified     Problem List Patient Active Problem List   Diagnosis Date Noted  . Status post total left knee replacement 01/17/2017  . Unilateral primary osteoarthritis, left knee 12/05/2016  . Degenerative disc disease, cervical     Darrel Hoover  PT 02/17/2017, 10:52 AM  [facility] [address] [place], [place], [address] Phone: [phone]   Fax:  [phone]  Name: [name] MRN: [id number] Date of Birth: 1961-08-28

## 2017-02-19 ENCOUNTER — Ambulatory Visit: Payer: Medicare Other | Admitting: Physical Therapy

## 2017-02-19 DIAGNOSIS — Z96652 Presence of left artificial knee joint: Secondary | ICD-10-CM

## 2017-02-19 DIAGNOSIS — R600 Localized edema: Secondary | ICD-10-CM

## 2017-02-19 DIAGNOSIS — M6281 Muscle weakness (generalized): Secondary | ICD-10-CM

## 2017-02-19 DIAGNOSIS — M25662 Stiffness of left knee, not elsewhere classified: Secondary | ICD-10-CM

## 2017-02-19 NOTE — Therapy (Signed)
[place], [place], [address] Phone: [phone]   Fax:  [phone]  Physical Therapy Treatment  Patient Details  Name: [name] MRN: [id number] Date of Birth: [birth year] Referring Provider: Jean Rosenthal, MD  Encounter Date: 02/19/2017      PT End of Session - 02/19/17 1026    Visit Number 4   Number of Visits 28   Date for PT Re-Evaluation 04/18/17   Authorization Type Medicare   PT Start Time 1012   PT Stop Time 1107   PT Time Calculation (min) 55 min      Past Medical History:  Diagnosis Date  . Anxiety   . Asthma   . Degenerative disc disease   . Degenerative disc disease, cervical   . Degenerative disc disease, cervical   . Headache    regular  . Hypertension   . Sleep apnea    mild, patient does not use mask    Past Surgical History:  Procedure Laterality Date  . BACK SURGERY    . NOVASURE ABLATION    . TOTAL KNEE ARTHROPLASTY Left 01/17/2017   Procedure: LEFT TOTAL KNEE ARTHROPLASTY;  Surgeon: Mcarthur Rossetti, MD;  Location: [facility];  Service: Orthopedics;  Laterality: Left;  . TUBAL LIGATION      There were no vitals filed for this visit.      Subjective Assessment - 02/19/17 1020    Subjective Knee keeps aching. I will never have the other one replaced.                         [facility] Adult PT Treatment/Exercise - 02/19/17 0001      Knee/Hip Exercises: Stretches   Active Hamstring Stretch 2 reps;30 seconds   Active Hamstring Stretch Limitations edge of seat, and long sitting    Gastroc Stretch Limitations slant board x 60 sec      Knee/Hip Exercises: Aerobic   Stationary Bike Rec bike partial revolutions x 4 min   Nustep L3 UE/LE x 4 min     Knee/Hip Exercises: Standing   Forward Step Up 10 reps;2 sets;Hand Hold: 1;Step Height: 6"   Functional Squat 15 reps   Functional Squat Limitations at counter     Knee/Hip Exercises: Seated    Long Arc Quad 15 reps   Long Arc Quad Weight 2 lbs.   Long Arc [employer] Limitations 10 sec      Knee/Hip Exercises: Supine   [employer] 20 reps   Short Arc Quad Sets Limitations 30 reps 5 sec holds 3#    Bridges Limitations 20   Straight Leg Raises Left;2 sets;10 reps   Straight Leg Raises Limitations with initial quad set      Knee/Hip Exercises: Sidelying   Hip ABduction 2 sets;Left;10 reps     Vasopneumatic   Number Minutes Vasopneumatic  15 minutes   Vasopnuematic Location  Knee   Vasopneumatic Pressure Low   Vasopneumatic Temperature  32     Manual Therapy   Manual Therapy Soft tissue mobilization   Soft tissue mobilization massasge roller to left thigh also retro massage and lymph activation                  PT Short Term Goals - 02/17/17 1051      PT SHORT TERM GOAL #1   Title She will be independent with inital HEP    Status Achieved     PT SHORT  TERM GOAL #2   Title She will demo active LT knee flexion to 105 degrees   Status Achieved     PT SHORT TERM GOAL #3   Title she will have active -15 degrees extension   Status On-going           PT Long Term Goals - 02/17/17 1024      PT LONG TERM GOAL #1   Title She will be independnet with all HEP issued    Status Achieved     PT LONG TERM GOAL #2   Title She will report pain as intermittant   Status On-going     PT LONG TERM GOAL #3   Title She will walk without device in and out of home.    Status Partially Met     PT LONG TERM GOAL #4   Title She will be able to walk stairs step over step with use of railings   Status On-going     PT LONG TERM GOAL #5   Title She will report sleeping in bed.    Status On-going     PT LONG TERM GOAL #6   Title She will demo LT knee extension from -5 degrees to 110 degrees flexion to improve gait and weight bearing tolerance and aid in sleep   Status On-going               Plan - 02/19/17 1057    Clinical Impression Statement Pt enters with  antalgic gait and no AD. She reports increased pain yesterday but better today. Sleep positions continue to be painful. Began closed chain today with good tolerance. Soft tissue work to left thigh to decrease pain and edema.    PT Next Visit Plan progress HEP- , manual for swelling pain and ROM.   vaso , closed chain as tolerated   PT Home Exercise Plan asked her to gently stretch for longer period os time and more frequently and to ice more. currently performing heel slides, SAQ, quad sets, SLR at home.    Consulted and Agree with Plan of Care Patient      Patient will benefit from skilled therapeutic intervention in order to improve the following deficits and impairments:  Decreased range of motion, Difficulty walking, Pain, Decreased strength, Decreased activity tolerance, Increased edema  Visit Diagnosis: Status post total left knee replacement  Stiffness of left knee, not elsewhere classified  Muscle weakness (generalized)  Localized edema     Problem List Patient Active Problem List   Diagnosis Date Noted  . Status post total left knee replacement 01/17/2017  . Unilateral primary osteoarthritis, left knee 12/05/2016  . Degenerative disc disease, cervical     Dorene Ar, PTA 02/19/2017, 10:58 AM  [facility] [address] [place], [place], [address] Phone: [phone]   Fax:  [phone]  Name: [name] MRN: [id number] Date of Birth: 1961/05/03

## 2017-02-25 ENCOUNTER — Ambulatory Visit: Payer: Medicare Other

## 2017-02-27 ENCOUNTER — Encounter (INDEPENDENT_AMBULATORY_CARE_PROVIDER_SITE_OTHER): Payer: Self-pay | Admitting: Orthopaedic Surgery

## 2017-02-27 ENCOUNTER — Ambulatory Visit (INDEPENDENT_AMBULATORY_CARE_PROVIDER_SITE_OTHER): Payer: Medicare Other | Admitting: Orthopaedic Surgery

## 2017-02-27 DIAGNOSIS — Z96652 Presence of left artificial knee joint: Secondary | ICD-10-CM

## 2017-02-27 NOTE — Progress Notes (Signed)
The patient is now 6 weeks status post a left total knee replacement. She is doing well. She is walking without assistive device. She still having daily pain and I told her this to be expected having had a knee replacement.  On examination her extension is full. She can flex about 105 which is somewhat limited by pain but but somewhat limited by her body habitus. Overall though the knee feels stable and she is doing well.  She'll continue increase her activities as she is couple. I'll see her back for one more visit in 4 weeks but no x-rays are needed. If she looks good then we'll need see her back for 6 months.

## 2017-02-28 ENCOUNTER — Ambulatory Visit: Payer: Medicare Other | Admitting: Physical Therapy

## 2017-02-28 DIAGNOSIS — Z96652 Presence of left artificial knee joint: Secondary | ICD-10-CM

## 2017-02-28 DIAGNOSIS — M25662 Stiffness of left knee, not elsewhere classified: Secondary | ICD-10-CM

## 2017-02-28 DIAGNOSIS — R600 Localized edema: Secondary | ICD-10-CM

## 2017-02-28 DIAGNOSIS — M6281 Muscle weakness (generalized): Secondary | ICD-10-CM

## 2017-02-28 DIAGNOSIS — R262 Difficulty in walking, not elsewhere classified: Secondary | ICD-10-CM

## 2017-02-28 NOTE — Therapy (Signed)
[place], [place], [address] Phone: [phone]   Fax:  [phone]  Physical Therapy Treatment  Patient Details  Name: [name] MRN: [id number] Date of Birth: [birth year] Referring Provider: Jean Rosenthal, MD  Encounter Date: 02/28/2017      PT End of Session - 02/28/17 1024    Visit Number 5   Number of Visits 28   Date for PT Re-Evaluation 04/18/17   Authorization Type Medicare   PT Start Time 5188   PT Stop Time 1105   PT Time Calculation (min) 50 min      Past Medical History:  Diagnosis Date  . Anxiety   . Asthma   . Degenerative disc disease   . Degenerative disc disease, cervical   . Degenerative disc disease, cervical   . Headache    regular  . Hypertension   . Sleep apnea    mild, patient does not use mask    Past Surgical History:  Procedure Laterality Date  . BACK SURGERY    . NOVASURE ABLATION    . TOTAL KNEE ARTHROPLASTY Left 01/17/2017   Procedure: LEFT TOTAL KNEE ARTHROPLASTY;  Surgeon: Mcarthur Rossetti, MD;  Location: [facility];  Service: Orthopedics;  Laterality: Left;  . TUBAL LIGATION      There were no vitals filed for this visit.                       [facility] Adult PT Treatment/Exercise - 02/28/17 0001      Ambulation/Gait   Stairs Yes   Stairs Assistance 6: Modified independent (Device/Increase time)   Stair Management Technique One rail Right;Alternating pattern   Number of Stairs 12   Height of Stairs 6   Pre-Gait Activities Retro stepping in parallel bars for involuntary quad activation. roll though    Gait Comments cue for equal step length/heel strike      Knee/Hip Exercises: Stretches   Gastroc Stretch Limitations slant board x 60 sec      Knee/Hip Exercises: Aerobic   Stationary Bike Rec bike full revolutions x 6 minutes      Knee/Hip Exercises: Standing   Forward Step Up 10 reps;2 sets;Hand Hold: 1;Step Height: 6"      Modalities   Modalities Electrical Stimulation;Cryotherapy     Cryotherapy   Number Minutes Cryotherapy 15 Minutes   Cryotherapy Location Knee   Type of Cryotherapy Ice pack     Electrical Stimulation   Electrical Stimulation Location left lateral knee/thigh   Electrical Stimulation Action IFC X 15 min   Electrical Stimulation Parameters 31ma   Electrical Stimulation Goals Pain     Manual Therapy   Soft tissue mobilization vastus lateralis and ITB, use of IASTM, rock blade for myofascial release                   PT Short Term Goals - 02/28/17 1108      PT SHORT TERM GOAL #1   Title She will be independent with inital HEP    Status Achieved     PT SHORT TERM GOAL #2   Title She will demo active LT knee flexion to 105 degrees   Status Achieved     PT SHORT TERM GOAL #3   Title she will have active -15 degrees extension   Status Unable to assess           PT Long Term Goals - 02/28/17 1108  PT LONG TERM GOAL #1   Title She will be independnet with all HEP issued    Time 12   Period Weeks   Status On-going     PT LONG TERM GOAL #2   Title She will report pain as intermittant   Baseline however severe pain at night    Time 12   Period Weeks   Status Achieved     PT LONG TERM GOAL #3   Title She will walk without device in and out of home.    Time 12   Period Weeks   Status Achieved     PT LONG TERM GOAL #4   Title She will be able to walk stairs step over step with use of railings   Time 12   Period Weeks   Status Achieved     PT LONG TERM GOAL #5   Title She will report sleeping in bed.    Time 12   Period Weeks   Status Achieved     PT LONG TERM GOAL #6   Title She will demo LT knee extension from -5 degrees to 110 degrees flexion to improve gait and weight bearing tolerance and aid in sleep   Time 12   Period Weeks   Status Unable to assess               Plan - 02/28/17 1110    Clinical Impression Statement  Antalgic gait improving, gait cues to further decrease antalgic gait. Pregait exercises and quad activation performed in parallel bars. Stair training with pt able to ascend and descend 12 6 inch steps in clinic alternating steps with right hand rail. She reports no pain with stairs. She has returned to sleeping in bed however she reports severe 10/10 pain at night and is unable to find relief. IASTm performed to vastus lateralis and ITB with pt reporting tnederness. Trial of IFC for to decrease pain and increase circulation. Pt's left knee is warm compared to the right. After treatment pt reports decreased pain.    PT Next Visit Plan progress HEP add closed chain to HEP-assess response to IFC and pain at night , manual (lateral knee)  for swelling pain and ROM.   vaso , closed chain as tolerated    PT Home Exercise Plan asked her to gently stretch for longer period os time and more frequently and to ice more. currently performing heel slides, SAQ, quad sets, SLR at home.    Consulted and Agree with Plan of Care Patient      Patient will benefit from skilled therapeutic intervention in order to improve the following deficits and impairments:  Decreased range of motion, Difficulty walking, Pain, Decreased strength, Decreased activity tolerance, Increased edema  Visit Diagnosis: Status post total left knee replacement  Stiffness of left knee, not elsewhere classified  Muscle weakness (generalized)  Localized edema  Difficulty in walking, not elsewhere classified     Problem List Patient Active Problem List   Diagnosis Date Noted  . Status post total left knee replacement 01/17/2017  . Unilateral primary osteoarthritis, left knee 12/05/2016  . Degenerative disc disease, cervical     Dorene Ar, PTA 02/28/2017, 11:19 AM  [place], [place], [address] Phone: [phone]   Fax:  [phone]  Name:  [name] MRN: [id number] Date of Birth: October 03, 1961

## 2017-03-03 ENCOUNTER — Ambulatory Visit: Payer: Medicare Other | Admitting: Physical Therapy

## 2017-03-05 ENCOUNTER — Ambulatory Visit: Payer: Medicare Other | Admitting: Physical Therapy

## 2017-03-05 DIAGNOSIS — Z96652 Presence of left artificial knee joint: Secondary | ICD-10-CM

## 2017-03-05 DIAGNOSIS — R600 Localized edema: Secondary | ICD-10-CM

## 2017-03-05 DIAGNOSIS — M25562 Pain in left knee: Secondary | ICD-10-CM

## 2017-03-05 DIAGNOSIS — M6281 Muscle weakness (generalized): Secondary | ICD-10-CM

## 2017-03-05 DIAGNOSIS — M25662 Stiffness of left knee, not elsewhere classified: Secondary | ICD-10-CM

## 2017-03-05 DIAGNOSIS — R262 Difficulty in walking, not elsewhere classified: Secondary | ICD-10-CM

## 2017-03-05 NOTE — Therapy (Addendum)
[place], [place], [address] Phone: [phone]   Fax:  [phone]  Physical Therapy Treatment/ Discharge  Patient Details  Name: CHRYSA RAMPY MRN: [id number] Date of Birth: 11-22-1961 Referring Provider: Jean Rosenthal, MD  Encounter Date: 03/05/2017      PT End of Session - 03/05/17 1250    Visit Number 6   Number of Visits 28   Date for PT Re-Evaluation 04/18/17   Authorization Type Medicare   PT Start Time 1247   PT Stop Time 1345   PT Time Calculation (min) 58 min   Activity Tolerance Patient tolerated treatment well;No increased pain   Behavior During Therapy WFL for tasks assessed/performed      Past Medical History:  Diagnosis Date  . Anxiety   . Asthma   . Degenerative disc disease   . Degenerative disc disease, cervical   . Degenerative disc disease, cervical   . Headache    regular  . Hypertension   . Sleep apnea    mild, patient does not use mask    Past Surgical History:  Procedure Laterality Date  . BACK SURGERY    . NOVASURE ABLATION    . TOTAL KNEE ARTHROPLASTY Left 01/17/2017   Procedure: LEFT TOTAL KNEE ARTHROPLASTY;  Surgeon: Mcarthur Rossetti, MD;  Location: [facility];  Service: Orthopedics;  Laterality: Left;  . TUBAL LIGATION      There were no vitals filed for this visit.      Subjective Assessment - 03/05/17 1249    Subjective This pain is terrible today. Still cannot get comfortable at night.    Currently in Pain? Yes   Pain Score 4    Pain Location Knee   Pain Orientation Left   Pain Descriptors / Indicators Aching   Aggravating Factors  trying to get comfortable, weather   Pain Relieving Factors cold, meds             [facility] PT Assessment - 03/05/17 0001      AROM   Left Knee Extension -8  quad lag   Left Knee Flexion 108     PROM   Left Knee Extension 0   Left Knee Flexion 110     Strength   Left Knee Flexion 4/5   Left Knee  Extension 4/5                     [facility] Adult PT Treatment/Exercise - 03/05/17 0001      Knee/Hip Exercises: Stretches   Hip Flexor Stretch 1 rep;60 seconds   Knee: Self-Stretch Limitations 3x 30 prone knee flexion with strap      Knee/Hip Exercises: Aerobic   Stationary Bike Rec bike full revolutions x 6 minutes      Knee/Hip Exercises: Standing   Lateral Step Up 10 reps   Forward Step Up 10 reps;2 sets;Hand Hold: 1;Step Height: 6"     Knee/Hip Exercises: Seated   Long Arc Quad 20 reps   Long Arc Quad Limitations green band    Hamstring Curl 20 reps   Hamstring Limitations green band      Knee/Hip Exercises: Supine   Short Arc Quad Sets Limitations 30 reps 5# 2 sec holds    Straight Leg Raises 10 reps   Straight Leg Raises Limitations -8 quad lag      Modalities   Modalities Moist Heat     Moist Heat Therapy   Number Minutes Moist Heat 15  Minutes   Moist Heat Location Knee                PT Education - 03/05/17 1335    Education provided Yes   Education Details HEP   Person(s) Educated Patient   Methods Explanation;Handout   Comprehension Verbalized understanding          PT Short Term Goals - 02/28/17 1108      PT SHORT TERM GOAL #1   Title She will be independent with inital HEP    Status Achieved     PT SHORT TERM GOAL #2   Title She will demo active LT knee flexion to 105 degrees   Status Achieved     PT SHORT TERM GOAL #3   Title she will have active -15 degrees extension   Status Unable to assess           PT Long Term Goals - 02/28/17 1108      PT LONG TERM GOAL #1   Title She will be independnet with all HEP issued    Time 12   Period Weeks   Status On-going     PT LONG TERM GOAL #2   Title She will report pain as intermittant   Baseline however severe pain at night    Time 12   Period Weeks   Status Achieved     PT LONG TERM GOAL #3   Title She will walk without device in and out of home.    Time 12    Period Weeks   Status Achieved     PT LONG TERM GOAL #4   Title She will be able to walk stairs step over step with use of railings   Time 12   Period Weeks   Status Achieved     PT LONG TERM GOAL #5   Title She will report sleeping in bed.    Time 12   Period Weeks   Status Achieved     PT LONG TERM GOAL #6   Title She will demo LT knee extension from -5 degrees to 110 degrees flexion to improve gait and weight bearing tolerance and aid in sleep   Time 12   Period Weeks   Status Unable to assess               Plan - 03/05/17 1254    Clinical Impression Statement Pt reports E-stim decreased pain for the rest of the day and evening however pain returns at night. She feels most limited by night pain and stiffness. Progressed HEP to include prone knee flexion stretch as well as seated theraband knee strength. Also added sit- stands. Pt is intentionally climbing stairs at home for strengthening. She still has quad lag.    PT Next Visit Plan PT LEFT WITHOUT NEW HEP-REPRINT AT NEXT VISIT Review HEP manual (lateral knee)  for swelling pain and ROM.   vaso , closed chain as tolerated    PT Home Exercise Plan asked her to gently stretch for longer period os time and more frequently and to ice more. currently performing heel slides, SAQ, quad sets, SLR at home.    Consulted and Agree with Plan of Care Patient      Patient will benefit from skilled therapeutic intervention in order to improve the following deficits and impairments:  Decreased range of motion, Difficulty walking, Pain, Decreased strength, Decreased activity tolerance, Increased edema  Visit Diagnosis: Status post total left knee replacement  Stiffness of  left knee, not elsewhere classified  Muscle weakness (generalized)  Localized edema  Difficulty in walking, not elsewhere classified  Left knee pain, unspecified chronicity     Problem List Patient Active Problem List   Diagnosis Date Noted  . Status  post total left knee replacement 01/17/2017  . Unilateral primary osteoarthritis, left knee 12/05/2016  . Degenerative disc disease, cervical     Dorene Ar, PTA 03/05/2017, 1:46 PM  [facility] [address] [place], [place], [address] Phone: [phone]   Fax:  [phone]  Name: SAKIYA STEPKA MRN: [id number] Date of Birth: [birth year]  PHYSICAL THERAPY DISCHARGE SUMMARY  Visits from Start of Care: 6  Current functional level related to goals / functional outcomes: Unknown as she did not return after this session   Remaining deficits: Unknown   Education / Equipment: HEP Plan:                                                    Patient goals were not met. Patient is being discharged due to not returning since the last visit.  ?????    Noralee Stain  PT  04/21/17   12:23AM

## 2017-03-05 NOTE — Patient Instructions (Signed)
Knee Extension: Resisted (Sitting)   With band looped around right ankle and under other foot, straighten leg with ankle loop. Keep other leg bent to increase resistance. Repeat __20__ times per set. Do _2___ sets per session. Do __2__ sessions per day.  http://orth.[email or web address].[email or web address]   CKnee Flexion: Resisted (Sitting)   Sit with band under left foot and looped around ankle of supported leg. Pull unsupported leg back. Repeat __20__ times per set. Do _1-2___ sets per session. Do __2__ sessions per day.  KNEE: Quadriceps - Prone    Place strap around ankle. Bring ankle toward buttocks. Press hip into surface. Hold __30_ seconds. _3__ reps per set, _2__ sets per day, 7___ days per week  Sitting to Standing    With straight back, tighten stomach, , lean slightly forward and stand. Repeat __10__ times per set. Do ___1-2_ sets per session. Do _2___ sessions per day.

## 2017-03-10 ENCOUNTER — Ambulatory Visit: Payer: Medicare Other

## 2017-03-11 ENCOUNTER — Telehealth: Payer: Self-pay | Admitting: Physical Therapy

## 2017-03-11 ENCOUNTER — Ambulatory Visit: Payer: Medicare Other | Admitting: Physical Therapy

## 2017-03-11 NOTE — Telephone Encounter (Signed)
Spoke to patient regarding missed appointment today. She forgot, but plans to attend her next appointment.

## 2017-03-13 ENCOUNTER — Ambulatory Visit: Payer: Medicare Other

## 2017-03-13 ENCOUNTER — Telehealth: Payer: Self-pay

## 2017-03-13 NOTE — Telephone Encounter (Signed)
Pt called and message left for her to contact us if she wants to return. Her last visit was scheduled for today and she sees Dr Ninfa Linden 03/26/17. She no showed today and she indicated she would be at this appoinment

## 2017-03-19 ENCOUNTER — Telehealth (INDEPENDENT_AMBULATORY_CARE_PROVIDER_SITE_OTHER): Payer: Self-pay | Admitting: Orthopaedic Surgery

## 2017-03-19 NOTE — Telephone Encounter (Signed)
Patient aware of the below message from Gil  

## 2017-03-19 NOTE — Telephone Encounter (Signed)
Ok to travel would get out of car every 1.5 to 2 hours and walk

## 2017-03-19 NOTE — Telephone Encounter (Signed)
Please advise 

## 2017-03-19 NOTE — Telephone Encounter (Signed)
Patient called and is suppose to be going out of town on Friday and its a 4 hour drive. She is wondering if she's okay to travel? She also fell the other day on that knee. CB # [phone]

## 2017-03-20 ENCOUNTER — Ambulatory Visit: Payer: Medicare Other

## 2017-03-21 ENCOUNTER — Telehealth (INDEPENDENT_AMBULATORY_CARE_PROVIDER_SITE_OTHER): Payer: Self-pay | Admitting: Orthopaedic Surgery

## 2017-03-21 NOTE — Telephone Encounter (Signed)
PT REQUESTED A CALL BACK, SHE FELL RECENTLY AND NONE HER HER MEDS ARE HELPING. PT HAS HAD SURGERY. I ADVISED DR. BLACKMAN DIDN'T HAVE AVAILABILITY. PLEASE ADVISE. PT HAS APPT NEXT [facility].  [phone]

## 2017-03-24 ENCOUNTER — Encounter (INDEPENDENT_AMBULATORY_CARE_PROVIDER_SITE_OTHER): Payer: Self-pay | Admitting: Physician Assistant

## 2017-03-24 ENCOUNTER — Ambulatory Visit (INDEPENDENT_AMBULATORY_CARE_PROVIDER_SITE_OTHER): Payer: Medicare Other

## 2017-03-24 ENCOUNTER — Ambulatory Visit (INDEPENDENT_AMBULATORY_CARE_PROVIDER_SITE_OTHER): Payer: Medicare Other | Admitting: Physician Assistant

## 2017-03-24 ENCOUNTER — Encounter (INDEPENDENT_AMBULATORY_CARE_PROVIDER_SITE_OTHER): Payer: Self-pay

## 2017-03-24 DIAGNOSIS — G8929 Other chronic pain: Secondary | ICD-10-CM

## 2017-03-24 DIAGNOSIS — M25562 Pain in left knee: Secondary | ICD-10-CM

## 2017-03-24 DIAGNOSIS — Z96651 Presence of right artificial knee joint: Secondary | ICD-10-CM

## 2017-03-24 NOTE — Telephone Encounter (Signed)
Patient is here today.

## 2017-03-24 NOTE — Progress Notes (Signed)
  [name] comes in today status post left total knee arthroplasty to 01/17/17. Still having pain in the knee and cannot get comfortable particularly at night. She is asking for radiographs. Performed on her knee as she fell on concrete last week. Pains been worse since the fall. She's taking ibuprofen and OxyContin for pain.  Physical exam left knee she has excellent range of motion of the knee. No rashes skin lesions ulcerations or effusion. Surgical incisions help well. No instability valgus varus stressing. Tenderness over the pes anserinus region of the left knee.   AP lateral views left knee: Show a well-seated total knee arthroplasty without complication. No acute fracture. Knee is well located.  Plan: Continue to work on range of motion strengthening knee. She has Voltaren gel she can apply up to 44 g daily. Ice is encouraged. Follow with [name] in 1 month to check her progress lack of.

## 2017-03-26 ENCOUNTER — Ambulatory Visit (INDEPENDENT_AMBULATORY_CARE_PROVIDER_SITE_OTHER): Payer: Medicare Other | Admitting: Orthopaedic Surgery

## 2017-04-04 ENCOUNTER — Other Ambulatory Visit: Payer: Self-pay | Admitting: Internal Medicine

## 2017-04-04 DIAGNOSIS — E2839 Other primary ovarian failure: Secondary | ICD-10-CM

## 2017-04-08 ENCOUNTER — Inpatient Hospital Stay
Admission: RE | Admit: 2017-04-08 | Discharge: 2017-04-08 | Disposition: A | Discharge status: Home / Self Care | Payer: Medicare Other | Source: Ambulatory Visit | Attending: Internal Medicine | Admitting: Internal Medicine

## 2017-04-23 ENCOUNTER — Encounter (INDEPENDENT_AMBULATORY_CARE_PROVIDER_SITE_OTHER): Payer: Self-pay | Admitting: Orthopaedic Surgery

## 2017-04-23 ENCOUNTER — Ambulatory Visit (INDEPENDENT_AMBULATORY_CARE_PROVIDER_SITE_OTHER): Payer: Medicare Other | Admitting: Orthopaedic Surgery

## 2017-04-23 VITALS — Ht 68.0 in | Wt 328.0 lb

## 2017-04-23 DIAGNOSIS — Z96652 Presence of left artificial knee joint: Secondary | ICD-10-CM

## 2017-04-23 NOTE — Progress Notes (Signed)
[name] returns today just over 3 months status post left total knee arthroplasty. She states she is overall trending towards improvement. Still has some soreness in the knee and pain particularly at night. She's tried the Voltaren gel on her knee does not feel really helps. She is asking about a pullover knee sleeve. Has some numbness lateral aspect of the knee.  Physical exam: Left knee she has full extension and flexion to approximately 110. No instability valgus varus stressing. Surgical incision is well-healed no signs of infection. Calf supple nontender.  Plan she'll continue to work on range of motion strengthening. Suggest that she pick up an over-the-counter knee sleeve. See her back in 3 months to check to see how she is progressing. Sooner if she has any questions or concerns.

## 2017-07-02 ENCOUNTER — Ambulatory Visit (INDEPENDENT_AMBULATORY_CARE_PROVIDER_SITE_OTHER): Payer: Medicare Other | Admitting: Orthopaedic Surgery

## 2017-07-11 ENCOUNTER — Encounter (HOSPITAL_COMMUNITY): Payer: Self-pay | Admitting: Emergency Medicine

## 2017-07-11 ENCOUNTER — Emergency Department (HOSPITAL_COMMUNITY): Payer: Medicare Other

## 2017-07-11 ENCOUNTER — Emergency Department (HOSPITAL_COMMUNITY)
Admission: EM | Admit: 2017-07-11 | Discharge: 2017-07-11 | Disposition: A | Discharge status: Home / Self Care | Payer: Medicare Other | Attending: Emergency Medicine | Admitting: Emergency Medicine

## 2017-07-11 DIAGNOSIS — R0789 Other chest pain: Secondary | ICD-10-CM | POA: Insufficient documentation

## 2017-07-11 DIAGNOSIS — I10 Essential (primary) hypertension: Secondary | ICD-10-CM | POA: Insufficient documentation

## 2017-07-11 DIAGNOSIS — J40 Bronchitis, not specified as acute or chronic: Secondary | ICD-10-CM | POA: Insufficient documentation

## 2017-07-11 DIAGNOSIS — Z79899 Other long term (current) drug therapy: Secondary | ICD-10-CM | POA: Insufficient documentation

## 2017-07-11 DIAGNOSIS — R0602 Shortness of breath: Secondary | ICD-10-CM | POA: Diagnosis present

## 2017-07-11 LAB — BASIC METABOLIC PANEL WITH GFR
Anion gap: 8 (ref 5–15)
BUN: 18 mg/dL (ref 6–20)
CO2: 27 mmol/L (ref 22–32)
Calcium: 8.9 mg/dL (ref 8.9–10.3)
Chloride: 105 mmol/L (ref 101–111)
Creatinine, Ser: 0.83 mg/dL (ref 0.44–1.00)
GFR calc Af Amer: >60 mL/min
GFR calc non Af Amer: >60 mL/min
Glucose, Bld: 135 mg/dL — ABNORMAL HIGH (ref 65–99)
Potassium: 3.1 mmol/L — ABNORMAL LOW (ref 3.5–5.1)
Sodium: 140 mmol/L (ref 135–145)

## 2017-07-11 LAB — CBC WITH DIFFERENTIAL/PLATELET
Basophils Absolute: 0 10*3/uL (ref 0.0–0.1)
Basophils Relative: 0 %
Eosinophils Absolute: 0.1 10*3/uL (ref 0.0–0.7)
Eosinophils Relative: 1 %
HCT: 36.8 % (ref 36.0–46.0)
Hemoglobin: 12.8 g/dL (ref 12.0–15.0)
Lymphocytes Relative: 42 %
Lymphs Abs: 2.5 10*3/uL (ref 0.7–4.0)
MCH: 30.8 pg (ref 26.0–34.0)
MCHC: 34.8 g/dL (ref 30.0–36.0)
MCV: 88.5 fL (ref 78.0–100.0)
Monocytes Absolute: 0.3 10*3/uL (ref 0.1–1.0)
Monocytes Relative: 4 %
Neutro Abs: 3.2 10*3/uL (ref 1.7–7.7)
Neutrophils Relative %: 53 %
Platelets: 203 10*3/uL (ref 150–400)
RBC: 4.16 MIL/uL (ref 3.87–5.11)
RDW: 14.1 % (ref 11.5–15.5)
WBC: 6 10*3/uL (ref 4.0–10.5)

## 2017-07-11 LAB — I-STAT TROPONIN, ED: Troponin i, poc: 0 ng/mL (ref 0.00–0.08)

## 2017-07-11 MED ORDER — IPRATROPIUM-ALBUTEROL 0.5-2.5 (3) MG/3ML IN SOLN
3.0000 mL | Freq: Once | RESPIRATORY_TRACT | Status: AC
  Administered 2017-07-11: 3 mL via RESPIRATORY_TRACT
  Filled 2017-07-11: qty 3

## 2017-07-11 MED ORDER — PREDNISONE 50 MG PO TABS: 50.0000 mg | ORAL_TABLET | Freq: Every day | ORAL | 0 refills | Status: DC

## 2017-07-11 MED ORDER — IPRATROPIUM-ALBUTEROL 0.5-2.5 (3) MG/3ML IN SOLN
3.0000 mL | Freq: Once | RESPIRATORY_TRACT | Status: AC
  Administered 2017-07-11: 3 mL via RESPIRATORY_TRACT

## 2017-07-11 MED ORDER — METHYLPREDNISOLONE SODIUM SUCC 125 MG IJ SOLR
125.0000 mg | Freq: Once | INTRAMUSCULAR | Status: AC
  Administered 2017-07-11: 125 mg via INTRAVENOUS
  Filled 2017-07-11: qty 2

## 2017-07-11 NOTE — ED Provider Notes (Signed)
[facility] DEPT Provider Note   CSN: [id number] Arrival date & time: 07/11/17  2947     History   Chief Complaint Chief Complaint  Patient presents with  . Shortness of Breath    HPI [name] is a 56 y.o. female.  The history is provided by the patient.  Shortness of Breath   She had onset about 5 AM of tightness in her chest and difficulty breathing. She does have a history of asthma, and this is fairly typical of her asthma. She denies nausea, vomiting, diaphoresis. She denies cough. She denies fever or chills. She did use her home nebulizer, but did not get any relief. She came in by ambulance where she received a nebulizer treatment which did help her dramatically. She is almost back to normal now. She is a nonsmoker and there is no history of diabetes or hyperlipidemia. She does have history of hypertension. There is no family history of premature coronary atherosclerosis. She is status post knee replacement 5 months ago.  Past Medical History:  Diagnosis Date  . Anxiety   . Asthma   . Degenerative disc disease   . Degenerative disc disease, cervical   . Degenerative disc disease, cervical   . Headache    regular  . Hypertension   . Sleep apnea    mild, patient does not use mask    Patient Active Problem List   Diagnosis Date Noted  . Status post total left knee replacement 01/17/2017  . Unilateral primary osteoarthritis, left knee 12/05/2016  . Degenerative disc disease, cervical     Past Surgical History:  Procedure Laterality Date  . BACK SURGERY    . NOVASURE ABLATION    . TOTAL KNEE ARTHROPLASTY Left 01/17/2017   Procedure: LEFT TOTAL KNEE ARTHROPLASTY;  Surgeon: Mcarthur Rossetti, MD;  Location: [facility];  Service: Orthopedics;  Laterality: Left;  . TUBAL LIGATION      OB History    No data available       Home Medications    Prior to Admission medications   Medication Sig Start Date End Date Taking? Authorizing Provider  albuterol  (PROVENTIL HFA;VENTOLIN HFA) 108 (90 BASE) MCG/ACT inhaler Inhale 2 puffs into the lungs every 6 (six) hours as needed for shortness of breath.     [provider]  albuterol (PROVENTIL) (2.5 MG/3ML) 0.083% nebulizer solution Take 2.5 mg by nebulization every 6 (six) hours as needed for wheezing or shortness of breath.    [provider]  ALPRAZolam Duanne Moron) 1 MG tablet Take 1 mg by mouth 2 (two) times daily as needed for anxiety. For anxiety     [provider]  amLODipine (NORVASC) 5 MG tablet Take 5 mg by mouth daily.    [provider]  atenolol (TENORMIN) 50 MG tablet Take 50 mg by mouth daily.     [provider]  cetirizine (ZYRTEC) 10 MG tablet Take 10 mg by mouth daily as needed for allergies.    [provider]  cyclobenzaprine (FLEXERIL) 5 MG tablet Take 1 tablet (5 mg total) by mouth 3 (three) times daily as needed for muscle spasms. 01/20/17   Mcarthur Rossetti, MD  diclofenac sodium (VOLTAREN) 1 % GEL Apply 2 g topically daily as needed (PAIN).    [provider]  FLOVENT HFA 220 MCG/ACT inhaler USE 2 PUFFS TWICE A DAY 03/10/17   [provider]  furosemide (LASIX) 20 MG tablet Take 20 mg by mouth daily as needed  for fluid or edema.    [provider]  HYDROmorphone (DILAUDID) 2 MG tablet Take 1 tablet (2 mg total) by mouth every 4 (four) hours as needed for severe pain. 01/20/17   Mcarthur Rossetti, MD  ibuprofen (ADVIL,MOTRIN) 800 MG tablet Take 800 mg by mouth 3 (three) times daily as needed for moderate pain.  08/28/15   [provider]  losartan-hydrochlorothiazide (HYZAAR) 100-25 MG per tablet Take 1 tablet by mouth daily.    [provider]  Oxycodone HCl 20 MG TABS TAKE 1 TABLET BY MOUTH EVERY 4-6 HOURS (MAX OF 5 PER DAY) 03/10/17   [provider]  oxyCODONE-acetaminophen (PERCOCET) 10-325 MG tablet Take 1 tablet by mouth every 4 (four) hours as needed for pain. 01/30/17    Mcarthur Rossetti, MD  phentermine (ADIPEX-P) 37.5 MG tablet Take 37.5 mg by mouth every morning. 03/18/17   [provider]  phentermine 30 MG capsule Take 30 mg by mouth every morning.    [provider]  QVAR 80 MCG/ACT inhaler Inhale 2 puffs into the lungs 2 (two) times daily. 03/04/16   [provider]  rivaroxaban (XARELTO) 10 MG TABS tablet Take 1 tablet (10 mg total) by mouth daily with breakfast. 01/20/17   Mcarthur Rossetti, MD  venlafaxine XR (EFFEXOR-XR) 37.5 MG 24 hr capsule Take 37.5 mg by mouth every evening. 03/10/17   [provider]    Family History Family History  Problem Relation Age of Onset  . Cancer Mother   . Diabetes Father   . Heart disease Father   . Cancer Sister     Social History Social History  Substance Use Topics  . Smoking status: Never Smoker  . Smokeless tobacco: Never Used  . Alcohol use Yes     Comment: occasional/social     Allergies   Patient has no known allergies.   Review of Systems Review of Systems  Respiratory: Positive for shortness of breath.   All other systems reviewed and are negative.    Physical Exam Updated Vital Signs BP (!) 185/89 (BP Location: Left Arm)   Pulse 98   Temp 98.1 F (36.7 C) (Oral)   Resp 17   LMP 04/20/2011   SpO2 98%   Physical Exam  Nursing note and vitals reviewed.  56 year old female, resting comfortably and in no acute distress. Vital signs are significant for hypertension. Oxygen saturation is 98%, which is normal. Head is normocephalic and atraumatic. PERRLA, EOMI. Oropharynx is clear. Neck is nontender and supple without adenopathy or JVD. Back is nontender and there is no CVA tenderness. Lungs are clear without rales, wheezes, or rhonchi. Slightly prolonged exhalation phase is noted. Chest is nontender. Heart has regular rate and rhythm without murmur. Abdomen is soft, flat, nontender without masses or hepatosplenomegaly and peristalsis  is normoactive. Extremities have no cyanosis or edema, full range of motion is present. Skin is warm and dry without rash. Neurologic: Mental status is normal, cranial nerves are intact, there are no motor or sensory deficits.  ED Treatments / Results  Labs (all labs ordered are listed, but only abnormal results are displayed) Labs Reviewed  CBC WITH DIFFERENTIAL/PLATELET  BASIC METABOLIC PANEL  I-STAT TROPONIN, ED    EKG  EKG Interpretation  Date/Time:  Friday July 11 2017 06:44:54 EDT Ventricular Rate:  98 PR Interval:    QRS Duration: 90 QT Interval:  322 QTC Calculation: 412 R Axis:   30 Text Interpretation:  Sinus rhythm  Probable left atrial enlargement Borderline repolarization abnormality When compared with ECG of 03/16/2016, HEART RATE has increased Confirmed by Delora Fuel ([id number]) on 07/11/2017 7:06:48 AM       Radiology Dg Chest 2 View  Result Date: 07/11/2017 CLINICAL DATA:  New onset shortness of breath. Personal history of asthma. EXAM: CHEST  2 VIEW COMPARISON:  Two-view chest x-ray 03/16/2016 FINDINGS: The heart size is normal. Lungs are clear. The visualized soft tissues and bony thorax are unremarkable. Cervical fusion is noted. Degenerative changes at the [facility] joints are worse on the right. IMPRESSION: No acute cardiopulmonary disease. Electronically Signed   By: San Morelle M.D.   On: 07/11/2017 07:19    Procedures Procedures (including critical care time)  Medications Ordered in ED Medications  ipratropium-albuterol (DUONEB) 0.5-2.5 (3) MG/3ML nebulizer solution 3 mL (not administered)     Initial Impression / Assessment and Plan / ED Course  I have reviewed the triage vital signs and the nursing notes.  Pertinent labs & imaging results that were available during my care of the patient were reviewed by me and considered in my medical decision making (see chart for details).  Dyspnea with findings suggestive of bronchospasm. Chest x-ray and ECG  are obtained and are unremarkable. Screening labs are obtained. She will be given additional albuterol with ipratropium in the ED. Heart Score = 3, which puts her at very low risk for major adverse cardiac event in the next 30 days. At this point, I feel comfortable that her symptoms are due to asthma and not cardiac related.  She had much better relief following second albuterol with ipratropium nebulizer treatment. At this point, lungs are almost clear. She had been given a dose of methylprednisolone. She is discharged with prescription for prednisone and is to continue using her home albuterol and DuoNeb treatments as needed.  Final Clinical Impressions(s) / ED Diagnoses   Final diagnoses:  Bronchitis    New Prescriptions New Prescriptions   PREDNISONE (DELTASONE) 50 MG TABLET    Take 1 tablet (50 mg total) by mouth daily.     Delora Fuel, MD 96/29/52 [phone]

## 2017-07-11 NOTE — ED Triage Notes (Signed)
Pt comes from home [address], [phone], c/o SOB starting 30 minutes ago. Pt has hx of asthma, tried her home inhaler and still had SOB. Ems gave deoneb albuterol 5mg  and atrovent .5. Pt denies pain, no allergies, alert x4, hx of HTN. V/s on arrival 194/77, pulse 104 rr 20 and spo02 96.

## 2017-07-11 NOTE — ED Notes (Signed)
EKG given to EDP,Glick,MD., for review. 

## 2017-07-11 NOTE — ED Notes (Signed)
Bed: WA09 Expected date:  Expected time:  Means of arrival:  Comments: 56 yo shortness of breath

## 2017-07-11 NOTE — Discharge Instructions (Signed)
Continue using your Duoneb and albuterol as needed. Return if symptoms are getting worse.

## 2017-07-14 ENCOUNTER — Ambulatory Visit (INDEPENDENT_AMBULATORY_CARE_PROVIDER_SITE_OTHER): Payer: Medicare Other | Admitting: Orthopaedic Surgery

## 2017-07-15 ENCOUNTER — Ambulatory Visit (INDEPENDENT_AMBULATORY_CARE_PROVIDER_SITE_OTHER): Payer: Medicare Other | Admitting: Orthopaedic Surgery

## 2017-07-15 ENCOUNTER — Other Ambulatory Visit (INDEPENDENT_AMBULATORY_CARE_PROVIDER_SITE_OTHER): Payer: Self-pay

## 2017-07-15 DIAGNOSIS — Z96652 Presence of left artificial knee joint: Secondary | ICD-10-CM

## 2017-07-15 DIAGNOSIS — T8484XD Pain due to internal orthopedic prosthetic devices, implants and grafts, subsequent encounter: Secondary | ICD-10-CM

## 2017-07-15 NOTE — Progress Notes (Signed)
The patient is well-known to me. She is a very pleasant morbidly obese female that is 6 months out from a left total knee arthroplasty. She sometimes feels as if the knee is going to give out on her. She is requesting further physical therapy and potentially a knee sleeve. He  On examination of her knee it does not hyperextend like her other knee and it feels stable with a negative drawer sign.  I would like to send her to physical therapy to work on strengthening all the muscles around the knee as well as her balance and coordination. We'll try a knee sleeve as well. I'll see her back in 6 weeks to see how she doing overall and I would like an AP and lateral of her left knee at that visit. I did review my previous operative note she has a 9 mm meter thickness polyethylene insert and if she continues to have symptoms of instability we may need to upsize her insert but I do feel she is to be further out from surgery and hopefully therapy can strengthen her knee and will give her more support.

## 2017-07-23 ENCOUNTER — Ambulatory Visit (INDEPENDENT_AMBULATORY_CARE_PROVIDER_SITE_OTHER): Payer: Medicare Other | Admitting: Orthopaedic Surgery

## 2017-07-24 ENCOUNTER — Ambulatory Visit: Payer: Medicare Other | Admitting: Physical Therapy

## 2017-07-28 ENCOUNTER — Ambulatory Visit: Payer: Medicare Other

## 2017-07-31 ENCOUNTER — Ambulatory Visit: Payer: Medicare Other

## 2017-08-06 ENCOUNTER — Ambulatory Visit: Payer: Medicare Other | Attending: Physical Therapy | Admitting: Physical Therapy

## 2017-08-13 ENCOUNTER — Ambulatory Visit (INDEPENDENT_AMBULATORY_CARE_PROVIDER_SITE_OTHER): Payer: Medicare Other

## 2017-08-13 ENCOUNTER — Encounter (INDEPENDENT_AMBULATORY_CARE_PROVIDER_SITE_OTHER): Payer: Self-pay | Admitting: Orthopaedic Surgery

## 2017-08-13 ENCOUNTER — Ambulatory Visit (INDEPENDENT_AMBULATORY_CARE_PROVIDER_SITE_OTHER): Payer: Medicare Other | Admitting: Orthopaedic Surgery

## 2017-08-13 DIAGNOSIS — M1712 Unilateral primary osteoarthritis, left knee: Secondary | ICD-10-CM

## 2017-08-13 NOTE — Progress Notes (Signed)
Office Visit Note   Patient: [name]           Date of Birth: 06-17-1961           MRN: [id number] Visit Date: 08/13/2017              Requested by: Nolene Ebbs, MD [address] [place], [place] [address] PCP: Nolene Ebbs, MD   Assessment & Plan: Visit Diagnoses:  1. Unilateral primary osteoarthritis, left knee     Plan: overall I think she has quad weakness which is causing her knee to give out.  She understands she needs to get back into PT which she will.  Hinged knee brace was given today.  Questions encouraged and answered.  Follow-Up Instructions: Return if symptoms worsen or fail to improve.   Orders:  Orders Placed This Encounter  Procedures  . XR KNEE 3 VIEW LEFT   No orders of the defined types were placed in this encounter.     Procedures: No procedures performed   Clinical Data: No additional findings.   Subjective: Chief Complaint  Patient presents with  . Left Knee - Pain, Routine Post Op    Patient comes in today for continued left knee pain and buckling sensation.  She noticed a knot on the back of her knee.  Denies constitutional sxs.  She has not done PT in several months.  Denies any injuries.    Review of Systems  Constitutional: Negative.   HENT: Negative.   Eyes: Negative.   Respiratory: Negative.   Cardiovascular: Negative.   Endocrine: Negative.   Musculoskeletal: Negative.   Neurological: Negative.   Hematological: Negative.   Psychiatric/Behavioral: Negative.   All other systems reviewed and are negative.    Objective: Vital Signs: LMP 04/20/2011   Physical Exam  Constitutional: She is oriented to person, place, and time. She appears well-developed and well-nourished.  Pulmonary/Chest: Effort normal.  Neurological: She is alert and oriented to person, place, and time.  Skin: Skin is warm. Capillary refill takes less than 2 seconds.  Psychiatric: She has a normal mood and affect. Her behavior is normal.  Judgment and thought content normal.  Nursing note and vitals reviewed.   Ortho Exam Left knee exam - fully healed surgical scar - no evidence of infection - collaterals are stable - normal ROM Specialty Comments:  No specialty comments available.  Imaging: Xr Knee 3 View Left  Result Date: 08/13/2017 Stable TKA in good alignment    PMFS History: Patient Active Problem List   Diagnosis Date Noted  . Status post total left knee replacement 01/17/2017  . Unilateral primary osteoarthritis, left knee 12/05/2016  . Degenerative disc disease, cervical    Past Medical History:  Diagnosis Date  . Anxiety   . Asthma   . Degenerative disc disease   . Degenerative disc disease, cervical   . Degenerative disc disease, cervical   . Headache    regular  . Hypertension   . Sleep apnea    mild, patient does not use mask    Family History  Problem Relation Age of Onset  . Cancer Mother   . Diabetes Father   . Heart disease Father   . Cancer Sister     Past Surgical History:  Procedure Laterality Date  . BACK SURGERY    . NOVASURE ABLATION    . TOTAL KNEE ARTHROPLASTY Left 01/17/2017   Procedure: LEFT TOTAL KNEE ARTHROPLASTY;  Surgeon: Mcarthur Rossetti, MD;  Location: [facility];  Service: Orthopedics;  Laterality: Left;  . TUBAL LIGATION     Social History   Occupational History  . Not on file.   Social History Main Topics  . Smoking status: Never Smoker  . Smokeless tobacco: Never Used  . Alcohol use Yes     Comment: occasional/social  . Drug use: No  . Sexual activity: Yes    Birth control/ protection: Surgical

## 2017-08-21 ENCOUNTER — Ambulatory Visit: Payer: Medicare Other | Attending: Physical Therapy | Admitting: Physical Therapy

## 2017-08-26 ENCOUNTER — Ambulatory Visit (INDEPENDENT_AMBULATORY_CARE_PROVIDER_SITE_OTHER): Payer: Medicare Other | Admitting: Orthopaedic Surgery

## 2017-09-10 ENCOUNTER — Ambulatory Visit (INDEPENDENT_AMBULATORY_CARE_PROVIDER_SITE_OTHER): Payer: Medicare Other | Admitting: Orthopaedic Surgery

## 2017-09-10 ENCOUNTER — Encounter (INDEPENDENT_AMBULATORY_CARE_PROVIDER_SITE_OTHER): Payer: Self-pay | Admitting: Orthopaedic Surgery

## 2017-09-10 DIAGNOSIS — Z96652 Presence of left artificial knee joint: Secondary | ICD-10-CM | POA: Diagnosis not present

## 2017-09-10 NOTE — Progress Notes (Signed)
The patient is now 8 months status post a left total knee replacement. She still has pain in that knee daily have her she is on chronic narcotics and she's been on for years. She is morbidly obese as well. She is requesting a rolling walker with a seat. She says she stays in bed a lot. He  On exam no the left knee is ligamentously stable with full range of motion. There is no effusion. There is no significant swelling in the knee itself. There is no redness.  X-rays from last month show well-seated implant with no complicating features.  At this point she'll continue increase her activities. I don't need to see her back for 5 months. At that visit I would like an AP and lateral of her left knee. I do feel that her chronic narcotics use contributes to this taking only over from pain standpoint. I did give her prescription for rolling walker with a seat.

## 2018-02-11 ENCOUNTER — Ambulatory Visit (INDEPENDENT_AMBULATORY_CARE_PROVIDER_SITE_OTHER): Payer: Medicare Other | Admitting: Orthopaedic Surgery

## 2018-02-18 ENCOUNTER — Ambulatory Visit (INDEPENDENT_AMBULATORY_CARE_PROVIDER_SITE_OTHER): Payer: Medicare Other | Admitting: Orthopaedic Surgery

## 2018-02-18 ENCOUNTER — Encounter (INDEPENDENT_AMBULATORY_CARE_PROVIDER_SITE_OTHER): Payer: Self-pay | Admitting: Orthopaedic Surgery

## 2018-02-18 ENCOUNTER — Ambulatory Visit (INDEPENDENT_AMBULATORY_CARE_PROVIDER_SITE_OTHER): Payer: Medicare Other

## 2018-02-18 DIAGNOSIS — G8929 Other chronic pain: Secondary | ICD-10-CM | POA: Diagnosis not present

## 2018-02-18 DIAGNOSIS — M7062 Trochanteric bursitis, left hip: Secondary | ICD-10-CM | POA: Diagnosis not present

## 2018-02-18 DIAGNOSIS — M25562 Pain in left knee: Secondary | ICD-10-CM

## 2018-02-18 MED ORDER — LIDOCAINE HCL 1 % IJ SOLN
3.0000 mL | INTRAMUSCULAR | Status: AC | PRN
Start: 2018-02-18 — End: 2018-02-18
  Administered 2018-02-18: 3 mL

## 2018-02-18 MED ORDER — METHYLPREDNISOLONE ACETATE 40 MG/ML IJ SUSP
40.0000 mg | INTRAMUSCULAR | Status: AC | PRN
  Administered 2018-02-18: 40 mg via INTRA_ARTICULAR

## 2018-02-18 NOTE — Progress Notes (Signed)
Office Visit Note   Patient: [name]           Date of Birth: 1961-07-05           MRN: [id number] Visit Date: 02/18/2018              Requested by: Danielle Ebbs, MD [address] [place], [place] [address] PCP: Danielle Ebbs, MD   Assessment & Plan: Visit Diagnoses:  1. Chronic pain of left knee   2. Trochanteric bursitis, left hip     Plan: I have her follow-up with [name] in 2 weeks to check her progress lack of.  He did show some IT band stretching exercises.  She will continue work on strengthening of the right knee.  She may require a back x-ray to see if she continues to have some tingling down the left leg.  Of note after the injection today she had decreased pain in her left thigh.  Follow-Up Instructions: Return in about 2 weeks (around 03/04/2018).   Orders:  Orders Placed This Encounter  Procedures  . Large Joint Inj  . XR Knee 1-2 Views Left   No orders of the defined types were placed in this encounter.     Procedures: Large Joint Inj: L greater trochanter on 02/18/2018 9:58 AM Indications: pain Details: 22 G 1.5 in needle, lateral approach  Arthrogram: No  Medications: 3 mL lidocaine 1 %; 40 mg methylPREDNISolone acetate 40 MG/ML Outcome: tolerated well, no immediate complications Procedure, treatment alternatives, risks and benefits explained, specific risks discussed. Consent was given by the patient. Immediately prior to procedure a time out was called to verify the correct patient, procedure, equipment, support staff and site/side marked as required. Patient was prepped and draped in the usual sterile fashion.       Clinical Data: No additional findings.   Subjective: Chief Complaint  Patient presents with  . Left Knee - Follow-up    HPI [name] returns today 13 months status post left total knee arthroplasty.  She states she still having pain in her left knee and her leg feels heavy.  She has had no injury to the knee.  States  knee pain is actually worse than what she had prior to surgery but when asked if it is actually knee she has no left thigh that bothers her.  She does have some tingling down both legs but she denies any radicular pain.  Pain is mid left thigh the lateral aspect of the left thigh pain is worse whenever she is lying on the left hip.  She denies any back pain.   Review of Systems No fevers chest pain, shortness of breath ,chills, please see HPI otherwise negative  Objective: Vital Signs: LMP 04/20/2011   Physical Exam  Constitutional: She is oriented to person, place, and time. She appears well-developed and well-nourished. No distress.  Cardiovascular: Intact distal pulses.  Pulmonary/Chest: Effort normal.  Neurological: She is alert and oriented to person, place, and time.  Skin: She is not diaphoretic.  Psychiatric: She has a normal mood and affect.    Ortho Exam Left knee she has full extension is able do straight leg raise flexion to at least 110 degrees.  No instability valgus varus stressing.  Surgical incisions well-healed no signs of infection.  No abnormal warmth erythema or effusion about the knee.  She has some slight tenderness of the medial joint line of the left knee.  Right knee nontender throughout and good range of motion.  Bilateral lower extremity she has 5 out of 5 strength throughout the lower extremities against resistance.  Good range of motion of both hips extremes of the left hip causes some discomfort laterally.  She has tenderness over the left greater trochanteric region.  No tenderness over the right trochanteric region.  Negative straight leg raise bilaterally. Specialty Comments:  No specialty comments available.  Imaging: Xr Knee 1-2 Views Left  Result Date: 02/18/2018 Radiographs left knee AP lateral views: No acute fracture.  Well-seated left total knee components without any signs of complication.  Knee is well located.    PMFS History: Patient Active  Problem List   Diagnosis Date Noted  . Status post total left knee replacement 01/17/2017  . Unilateral primary osteoarthritis, left knee 12/05/2016  . Degenerative disc disease, cervical    Past Medical History:  Diagnosis Date  . Anxiety   . Asthma   . Degenerative disc disease   . Degenerative disc disease, cervical   . Degenerative disc disease, cervical   . Headache    regular  . Hypertension   . Sleep apnea    mild, patient does not use mask    Family History  Problem Relation Age of Onset  . Cancer Mother   . Diabetes Father   . Heart disease Father   . Cancer Sister     Past Surgical History:  Procedure Laterality Date  . BACK SURGERY    . NOVASURE ABLATION    . TOTAL KNEE ARTHROPLASTY Left 01/17/2017   Procedure: LEFT TOTAL KNEE ARTHROPLASTY;  Surgeon: Danielle Rossetti, MD;  Location: [facility];  Service: Orthopedics;  Laterality: Left;  . TUBAL LIGATION     Social History   Occupational History  . Not on file  Tobacco Use  . Smoking status: Never Smoker  . Smokeless tobacco: Never Used  Substance and Sexual Activity  . Alcohol use: Yes    Comment: occasional/social  . Drug use: No  . Sexual activity: Yes    Birth control/protection: Surgical

## 2018-03-04 ENCOUNTER — Ambulatory Visit (INDEPENDENT_AMBULATORY_CARE_PROVIDER_SITE_OTHER): Payer: Medicare Other | Admitting: Physician Assistant

## 2018-03-09 ENCOUNTER — Ambulatory Visit (INDEPENDENT_AMBULATORY_CARE_PROVIDER_SITE_OTHER): Payer: Medicare Other | Admitting: Physician Assistant

## 2018-03-09 ENCOUNTER — Encounter (INDEPENDENT_AMBULATORY_CARE_PROVIDER_SITE_OTHER): Payer: Self-pay

## 2018-03-16 ENCOUNTER — Ambulatory Visit (INDEPENDENT_AMBULATORY_CARE_PROVIDER_SITE_OTHER): Payer: Medicare Other | Admitting: Physician Assistant

## 2018-03-16 ENCOUNTER — Encounter (INDEPENDENT_AMBULATORY_CARE_PROVIDER_SITE_OTHER): Payer: Self-pay | Admitting: Physician Assistant

## 2018-03-16 DIAGNOSIS — M5442 Lumbago with sciatica, left side: Secondary | ICD-10-CM | POA: Diagnosis not present

## 2018-03-16 DIAGNOSIS — M7062 Trochanteric bursitis, left hip: Secondary | ICD-10-CM

## 2018-03-16 NOTE — Progress Notes (Signed)
HPI: Ms. [name] returns today 2 weeks status post left hip trochanteric injection.  She states the injection helped.  She still has some pins and needle like sensation down the leg below the knee and some low back pain.  However the pain in her hip that was causing her pain down to the knee area is much improved.  She continues to have back pain and has had back pain for years.  She is had no known injury to the back no bowel bladder dysfunction.  Physical exam: General well-developed well-nourished female no acute distress mood and affect appropriate.  She ambulates without any assistive device.  Still tenderness down the entire her IT band.  She has good range of motion of her left knee well-healed surgical incision.  She has 5 out of 5 strength throughout the lower extremities against resistance.  Good range of motion of bilateral hips.  Impression: Left IT band tendinitis Chronic low back pain with radicular symptoms left leg  Plan spoke with her about getting plain radiographs and possible MRI of her lumbar spine she defers.  She states she does not like to have MRI due to claustrophobia.  Also discussed what would be our goals with the MRI and that being possible epidural steroid injections and she is not interested in having epidural steroid injections at this time.  Therefore recommend that we send her to physical therapy for back and for IT band stretching.  Discussed with her at length weight loss and getting physical exercise on a daily basis.  She does wish to talk to her primary care doctor again about possible weight loss surgery.  She will follow-up with [name] on an as-needed basis pain persist or becomes worse.  Questions were encouraged and answered at length

## 2018-05-20 ENCOUNTER — Emergency Department (HOSPITAL_COMMUNITY): Payer: Medicare Other

## 2018-05-20 ENCOUNTER — Emergency Department (HOSPITAL_COMMUNITY)
Admission: EM | Admit: 2018-05-20 | Discharge: 2018-05-20 | Disposition: A | Discharge status: Home / Self Care | Payer: Medicare Other | Attending: Emergency Medicine | Admitting: Emergency Medicine

## 2018-05-20 ENCOUNTER — Encounter (HOSPITAL_COMMUNITY): Payer: Self-pay | Admitting: Emergency Medicine

## 2018-05-20 DIAGNOSIS — S8992XA Unspecified injury of left lower leg, initial encounter: Secondary | ICD-10-CM | POA: Diagnosis not present

## 2018-05-20 DIAGNOSIS — Y999 Unspecified external cause status: Secondary | ICD-10-CM | POA: Diagnosis not present

## 2018-05-20 DIAGNOSIS — I10 Essential (primary) hypertension: Secondary | ICD-10-CM | POA: Insufficient documentation

## 2018-05-20 DIAGNOSIS — J45909 Unspecified asthma, uncomplicated: Secondary | ICD-10-CM | POA: Insufficient documentation

## 2018-05-20 DIAGNOSIS — S199XXA Unspecified injury of neck, initial encounter: Secondary | ICD-10-CM | POA: Diagnosis not present

## 2018-05-20 DIAGNOSIS — Y92410 Unspecified street and highway as the place of occurrence of the external cause: Secondary | ICD-10-CM | POA: Diagnosis not present

## 2018-05-20 DIAGNOSIS — Y9389 Activity, other specified: Secondary | ICD-10-CM | POA: Insufficient documentation

## 2018-05-20 DIAGNOSIS — Z79899 Other long term (current) drug therapy: Secondary | ICD-10-CM | POA: Insufficient documentation

## 2018-05-20 DIAGNOSIS — Z7901 Long term (current) use of anticoagulants: Secondary | ICD-10-CM | POA: Diagnosis not present

## 2018-05-20 MED ORDER — CYCLOBENZAPRINE HCL 10 MG PO TABS: 10.0000 mg | ORAL_TABLET | Freq: Two times a day (BID) | ORAL | 0 refills | Status: DC | PRN
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
## 2018-05-20 NOTE — Discharge Instructions (Signed)
Your images today were reassuring.  No evidence of new fractures or dislocations.  Please use the muscle relaxant to help with your muscle spasm and pain.  Please do not drive while taking this medicine.  Please follow-up with a PCP in several days for reassessment.  If any symptoms change or worsen, please return to the nearest emergency department.

## 2018-05-20 NOTE — ED Notes (Addendum)
Patient was upset that paperwork was taking too long. She left before D/C paperwork was given.

## 2018-06-30 DIAGNOSIS — I10 Essential (primary) hypertension: Secondary | ICD-10-CM | POA: Insufficient documentation

## 2018-06-30 DIAGNOSIS — F419 Anxiety disorder, unspecified: Secondary | ICD-10-CM | POA: Diagnosis not present

## 2018-06-30 DIAGNOSIS — Z96652 Presence of left artificial knee joint: Secondary | ICD-10-CM | POA: Insufficient documentation

## 2018-06-30 DIAGNOSIS — J45909 Unspecified asthma, uncomplicated: Secondary | ICD-10-CM | POA: Insufficient documentation

## 2018-06-30 DIAGNOSIS — Z79899 Other long term (current) drug therapy: Secondary | ICD-10-CM | POA: Diagnosis not present

## 2018-06-30 DIAGNOSIS — R55 Syncope and collapse: Secondary | ICD-10-CM | POA: Insufficient documentation

## 2018-06-30 NOTE — ED Triage Notes (Signed)
Patient reports on Friday, Saturday, and Sunday she was lightheaded and nausea. While walking today from a building to a car, she became "really really lighheaded", and dizzy. She attempted to lower herself to the ground because she felt like she was going to "pass out". Patient is unsure if had a syncopal episode

## 2018-07-01 ENCOUNTER — Emergency Department (HOSPITAL_COMMUNITY)
Admission: EM | Admit: 2018-07-01 | Discharge: 2018-07-01 | Disposition: A | Discharge status: Home / Self Care | Payer: Medicare Other | Attending: Emergency Medicine | Admitting: Emergency Medicine

## 2018-07-01 ENCOUNTER — Encounter (HOSPITAL_COMMUNITY): Payer: Self-pay | Admitting: Family Medicine

## 2018-07-01 DIAGNOSIS — R55 Syncope and collapse: Secondary | ICD-10-CM | POA: Diagnosis not present

## 2018-07-01 HISTORY — DX: Other intervertebral disc degeneration, lumbar region without mention of lumbar back pain or lower extremity pain: M51.369

## 2018-07-01 HISTORY — DX: Other intervertebral disc degeneration, lumbar region: M51.36

## 2018-07-01 HISTORY — DX: Fibromyalgia: M79.7

## 2018-07-01 LAB — CBC
HCT: 40.9 % (ref 36.0–46.0)
HEMOGLOBIN: 14 g/dL (ref 12.0–15.0)
MCH: 31.3 pg (ref 26.0–34.0)
MCHC: 34.2 g/dL (ref 30.0–36.0)
MCV: 91.3 fL (ref 78.0–100.0)
PLATELETS: 406 10*3/uL — AB (ref 150–400)
RBC: 4.48 MIL/uL (ref 3.87–5.11)
RDW: 13.1 % (ref 11.5–15.5)
WBC: 7.2 10*3/uL (ref 4.0–10.5)

## 2018-07-01 LAB — URINALYSIS, ROUTINE W REFLEX MICROSCOPIC
BILIRUBIN URINE: NEGATIVE
GLUCOSE, UA: NEGATIVE mg/dL
Hgb urine dipstick: NEGATIVE
Ketones, ur: NEGATIVE mg/dL
NITRITE: NEGATIVE
PH: 5 (ref 5.0–8.0)
Protein, ur: 30 mg/dL — AB
SPECIFIC GRAVITY, URINE: 1.026 (ref 1.005–1.030)

## 2018-07-01 LAB — BASIC METABOLIC PANEL
ANION GAP: 12 (ref 5–15)
BUN: 22 mg/dL — ABNORMAL HIGH (ref 6–20)
CALCIUM: 9.6 mg/dL (ref 8.9–10.3)
CO2: 28 mmol/L (ref 22–32)
CREATININE: 1.09 mg/dL — AB (ref 0.44–1.00)
Chloride: 102 mmol/L (ref 98–111)
GFR, EST NON AFRICAN AMERICAN: 55 mL/min — AB (ref >60)
Glucose, Bld: 120 mg/dL — ABNORMAL HIGH (ref 70–99)
Potassium: 3.5 mmol/L (ref 3.5–5.1)
SODIUM: 142 mmol/L (ref 135–145)

## 2018-07-01 MED ORDER — SODIUM CHLORIDE 0.9 % IV BOLUS
1000.0000 mL | Freq: Once | INTRAVENOUS | Status: AC
  Administered 2018-07-01: 1000 mL via INTRAVENOUS

## 2018-07-01 NOTE — Discharge Instructions (Signed)
As discussed, your evaluation today has been largely reassuring.  But, it is important that you monitor your condition carefully, and do not hesitate to return to the ED if you develop new, or concerning changes in your condition. ? ?Otherwise, please follow-up with your physician for appropriate ongoing care. ? ?

## 2018-07-01 NOTE — ED Notes (Signed)
Bed: WA03 Expected date:  Expected time:  Means of arrival:  Comments: 

## 2018-07-01 NOTE — ED Notes (Signed)
Patient is still finishing her ordered IV fluids. Patient updated on POC and reminded to keep her arm straight for fluid infusion.

## 2018-07-01 NOTE — ED Provider Notes (Signed)
[facility] DEPT Provider Note   CSN: [id number] Arrival date & time: 06/30/18  2236     History   Chief Complaint Chief Complaint  Patient presents with  . Near Syncope    HPI [name] is a 57 y.o. female.  HPI Presents after episode of near syncope, now with mild headache. Headache is diffuse, left-sided, with some discomfort about the ear. She was generally well until a few days ago, and she notes that over the last few days she has felt slightly weaker than usual, slight dizziness, with a more pronounced episode in the past few hours, without focal chest pain, without syncope, without asymmetric weakness anywhere. No confusion, disorientation, fever, chills. No recent medication change, diet change. Patient is on phentermine.  Past Medical History:  Diagnosis Date  . Anxiety   . Asthma   . DDD (degenerative disc disease), lumbar   . Degenerative disc disease   . Degenerative disc disease, cervical   . Degenerative disc disease, cervical   . Fibromyalgia   . Headache    regular  . Hypertension   . Sleep apnea    mild, patient does not use mask    Patient Active Problem List   Diagnosis Date Noted  . Status post total left knee replacement 01/17/2017  . Unilateral primary osteoarthritis, left knee 12/05/2016  . Degenerative disc disease, cervical     Past Surgical History:  Procedure Laterality Date  . BACK SURGERY    . NOVASURE ABLATION    . TOTAL KNEE ARTHROPLASTY Left 01/17/2017   Procedure: LEFT TOTAL KNEE ARTHROPLASTY;  Surgeon: Mcarthur Rossetti, MD;  Location: [facility];  Service: Orthopedics;  Laterality: Left;  . TUBAL LIGATION       OB History   None      Home Medications    Prior to Admission medications   Medication Sig Start Date End Date Taking? Authorizing Provider  albuterol (PROVENTIL HFA;VENTOLIN HFA) 108 (90 BASE) MCG/ACT inhaler Inhale 2 puffs into the lungs every 6 (six) hours as needed  for shortness of breath.    Yes [provider]  albuterol (PROVENTIL) (2.5 MG/3ML) 0.083% nebulizer solution Take 2.5 mg by nebulization every 6 (six) hours as needed for wheezing or shortness of breath.   Yes [provider]  atenolol (TENORMIN) 100 MG tablet Take 100 mg by mouth daily.    Yes [provider]  cetirizine (ZYRTEC) 10 MG tablet Take 10 mg by mouth daily as needed for allergies.   Yes [provider]  diclofenac sodium (VOLTAREN) 1 % GEL Apply 2 g topically daily as needed (PAIN).   Yes [provider]  FLOVENT HFA 220 MCG/ACT inhaler USE 2 PUFFS TWICE A DAY 03/10/17  Yes [provider]  furosemide (LASIX) 20 MG tablet Take 20 mg by mouth daily as needed for fluid or edema.   Yes [provider]  ibuprofen (ADVIL,MOTRIN) 800 MG tablet Take 800 mg by mouth 3 (three) times daily as needed for moderate pain.  08/28/15  Yes [provider]  losartan-hydrochlorothiazide (HYZAAR) 100-25 MG per tablet Take 1 tablet by mouth daily.   Yes [provider]  Oxycodone HCl 20 MG TABS TAKE 1 TABLET BY MOUTH EVERY 4-6 HOURS (MAX OF 5 PER DAY) 03/10/17  Yes [provider]  phentermine (ADIPEX-P) 37.5 MG tablet Take 37.5 mg by mouth every morning. 06/12/18  Yes [provider]  amitriptyline (ELAVIL) 25 MG tablet Take 25 mg  by mouth at bedtime as needed for sleep. 06/01/18   [provider]    Family History Family History  Problem Relation Age of Onset  . Cancer Mother   . Diabetes Father   . Heart disease Father   . Cancer Sister     Social History Social History   Tobacco Use  . Smoking status: Never Smoker  . Smokeless tobacco: Never Used  Substance Use Topics  . Alcohol use: Not Currently  . Drug use: No     Allergies   Patient has no known allergies.   Review of Systems Review of Systems  Constitutional:       Per HPI, otherwise negative  HENT:       Per HPI,  otherwise negative  Respiratory:       Per HPI, otherwise negative  Cardiovascular:       Per HPI, otherwise negative  Gastrointestinal: Negative for vomiting.  Endocrine:       Negative aside from HPI  Genitourinary:       Neg aside from HPI   Musculoskeletal:       Per HPI, otherwise negative  Skin: Negative.   Neurological: Negative for syncope.     Physical Exam Updated Vital Signs BP 125/80   Pulse (!) 56   Temp 98.1 F (36.7 C) (Oral)   Resp 16   Ht 5\' 8"  (1.727 m)   Wt (!) 148.8 kg (328 lb)   LMP 04/20/2011   SpO2 98%   BMI 49.87 kg/m   Physical Exam  Constitutional: She is oriented to person, place, and time. She appears well-developed and well-nourished. No distress.  Obese female awake and alert  HENT:  Head: Normocephalic and atraumatic.  Both external and internal auditory canals unremarkable, mild wax.  Eyes: Conjunctivae and EOM are normal.  Cardiovascular: Normal rate and regular rhythm.  Pulmonary/Chest: Effort normal and breath sounds normal. No stridor. No respiratory distress.  Abdominal: She exhibits no distension.  Musculoskeletal: She exhibits no edema.  Neurological: She is alert and oriented to person, place, and time. No cranial nerve deficit.  Skin: Skin is warm and dry.  Psychiatric: She has a normal mood and affect.  Nursing note and vitals reviewed.    ED Treatments / Results  Labs (all labs ordered are listed, but only abnormal results are displayed) Labs Reviewed  BASIC METABOLIC PANEL - Abnormal; Notable for the following components:      Result Value   Glucose, Bld 120 (*)    BUN 22 (*)    Creatinine, Ser 1.09 (*)    GFR calc non Af Amer 55 (*)    All other components within normal limits  CBC - Abnormal; Notable for the following components:   Platelets 406 (*)    All other components within normal limits  URINALYSIS, ROUTINE W REFLEX MICROSCOPIC - Abnormal; Notable for the following components:   APPearance CLOUDY (*)     Protein, ur 30 (*)    Leukocytes, UA LARGE (*)    Bacteria, UA RARE (*)    All other components within normal limits    EKG SR 68, nonspecific t wave changes.  nml axis otherwise unremarkable ECG   Radiology No results found.  Procedures Procedures (including critical care time)  Medications Ordered in ED Medications  sodium chloride 0.9 % bolus 1,000 mL (1,000 mLs Intravenous New Bag/Given 07/01/18 0737)     Initial Impression / Assessment and Plan / ED Course  I have reviewed  the triage vital signs and the nursing notes.  Pertinent labs & imaging results that were available during my care of the patient were reviewed by me and considered in my medical decision making (see chart for details).     9:13 AM On repeat exam the patient states that she feels better. I reviewed all findings and with her, reassuring results.  I know the etiology for her episode of lightheadedness is not entirely clear, there is no evidence for sustained arrhythmia, no evidence for infection, no ongoing pain, low suspicion for ACS or other atypical chest pain. Some suspicion for mild dehydration, and given her improvement here with fluids, this is substantiated. Patient discharged in stable condition with close outpatient follow-up.  Final Clinical Impressions(s) / ED Diagnoses   Final diagnoses:  Near syncope     Carmin Muskrat, MD 07/01/18 [phone]

## 2018-07-01 NOTE — ED Notes (Signed)
Discharge instructions reviewed with patient. Patient verbalizes understanding. VSS.   

## 2019-03-12 ENCOUNTER — Encounter (HOSPITAL_COMMUNITY): Payer: Self-pay | Admitting: Emergency Medicine

## 2019-03-12 ENCOUNTER — Other Ambulatory Visit: Payer: Self-pay

## 2019-03-12 ENCOUNTER — Emergency Department (HOSPITAL_COMMUNITY)
Admission: EM | Admit: 2019-03-12 | Discharge: 2019-03-12 | Disposition: A | Discharge status: Home / Self Care | Payer: Medicare Other | Attending: Emergency Medicine | Admitting: Emergency Medicine

## 2019-03-12 ENCOUNTER — Emergency Department (HOSPITAL_COMMUNITY): Payer: Medicare Other

## 2019-03-12 DIAGNOSIS — J4521 Mild intermittent asthma with (acute) exacerbation: Secondary | ICD-10-CM | POA: Diagnosis not present

## 2019-03-12 DIAGNOSIS — Z79899 Other long term (current) drug therapy: Secondary | ICD-10-CM | POA: Diagnosis not present

## 2019-03-12 DIAGNOSIS — I10 Essential (primary) hypertension: Secondary | ICD-10-CM | POA: Diagnosis not present

## 2019-03-12 DIAGNOSIS — R062 Wheezing: Secondary | ICD-10-CM | POA: Diagnosis present

## 2019-03-12 MED ORDER — AEROCHAMBER PLUS FLO-VU MEDIUM MISC
1.0000 | Freq: Once | Status: AC
  Administered 2019-03-12: 1

## 2019-03-12 MED ORDER — ALBUTEROL SULFATE HFA 108 (90 BASE) MCG/ACT IN AERS
2.0000 | INHALATION_SPRAY | Freq: Once | RESPIRATORY_TRACT | Status: AC
  Administered 2019-03-12: 2 via RESPIRATORY_TRACT
  Filled 2019-03-12: qty 6.7

## 2019-03-12 NOTE — ED Triage Notes (Signed)
Pt BIB POV. Pt developed tightness in her chest around an hour ago was SOB on onset but denies SOB now as the tightness has subsided. Pt unable to use home treatments due to not being able to find her nebulizer. Inhaler has been used with some relief.

## 2019-03-12 NOTE — ED Notes (Signed)
EKG given to EDP,Palumbo,MD., for review. 

## 2019-03-12 NOTE — ED Notes (Signed)
After 2 unsuccessful attempts by 2 separate RN's pt refused any further lab draw attempts

## 2019-03-12 NOTE — ED Provider Notes (Addendum)
[facility] DEPT Provider Note   CSN: [id number] Arrival date & time: 03/12/19  0118    History   Chief Complaint Chief Complaint  Patient presents with  . Asthma    HPI [name] is a 58 y.o. female.     The history is provided by the patient.  Asthma  This is a recurrent problem. The current episode started 6 to 12 hours ago. The problem occurs constantly. The problem has been resolved. Pertinent negatives include no chest pain, no abdominal pain, no headaches and no shortness of breath. Nothing aggravates the symptoms. Nothing relieves the symptoms. Treatments tried: an inhaler. The treatment provided significant relief.  Had tightness with wheezing but this has resolved.  No f/c/r.  No anosmia, no cough.  No travel.  No sick exposures.  Was cleaning a dusty room when symptoms started.    Past Medical History:  Diagnosis Date  . Anxiety   . Asthma   . DDD (degenerative disc disease), lumbar   . Degenerative disc disease   . Degenerative disc disease, cervical   . Degenerative disc disease, cervical   . Fibromyalgia   . Headache    regular  . Hypertension   . Sleep apnea    mild, patient does not use mask    Patient Active Problem List   Diagnosis Date Noted  . Status post total left knee replacement 01/17/2017  . Unilateral primary osteoarthritis, left knee 12/05/2016  . Degenerative disc disease, cervical     Past Surgical History:  Procedure Laterality Date  . BACK SURGERY    . NOVASURE ABLATION    . TOTAL KNEE ARTHROPLASTY Left 01/17/2017   Procedure: LEFT TOTAL KNEE ARTHROPLASTY;  Surgeon: Mcarthur Rossetti, MD;  Location: [facility];  Service: Orthopedics;  Laterality: Left;  . TUBAL LIGATION       OB History   No obstetric history on file.      Home Medications    Prior to Admission medications   Medication Sig Start Date End Date Taking? Authorizing Provider  albuterol (PROVENTIL HFA;VENTOLIN HFA) 108 (90  BASE) MCG/ACT inhaler Inhale 2 puffs into the lungs every 6 (six) hours as needed for shortness of breath.     [provider]  albuterol (PROVENTIL) (2.5 MG/3ML) 0.083% nebulizer solution Take 2.5 mg by nebulization every 6 (six) hours as needed for wheezing or shortness of breath.    [provider]  amitriptyline (ELAVIL) 25 MG tablet Take 25 mg by mouth at bedtime as needed for sleep. 06/01/18   [provider]  atenolol (TENORMIN) 100 MG tablet Take 100 mg by mouth daily.     [provider]  cetirizine (ZYRTEC) 10 MG tablet Take 10 mg by mouth daily as needed for allergies.    [provider]  diclofenac sodium (VOLTAREN) 1 % GEL Apply 2 g topically daily as needed (PAIN).    [provider]  FLOVENT HFA 220 MCG/ACT inhaler USE 2 PUFFS TWICE A DAY 03/10/17   [provider]  furosemide (LASIX) 20 MG tablet Take 20 mg by mouth daily as needed for fluid or edema.    [provider]  ibuprofen (ADVIL,MOTRIN) 800 MG tablet Take 800 mg by mouth 3 (three) times daily as needed for moderate pain.  08/28/15   [provider]  losartan-hydrochlorothiazide (HYZAAR) 100-25 MG per tablet Take 1 tablet by mouth daily.    [provider]  Oxycodone HCl 20 MG TABS TAKE  1 TABLET BY MOUTH EVERY 4-6 HOURS (MAX OF 5 PER DAY) 03/10/17   [provider]  phentermine (ADIPEX-P) 37.5 MG tablet Take 37.5 mg by mouth every morning. 06/12/18   [provider]    Family History Family History  Problem Relation Age of Onset  . Cancer Mother   . Diabetes Father   . Heart disease Father   . Cancer Sister     Social History Social History   Tobacco Use  . Smoking status: Never Smoker  . Smokeless tobacco: Never Used  Substance Use Topics  . Alcohol use: Not Currently  . Drug use: No     Allergies   Patient has no known allergies.   Review of Systems Review of Systems  Constitutional: Negative for  fever.  HENT: Negative for congestion and sore throat.   Respiratory: Positive for wheezing. Negative for shortness of breath.   Cardiovascular: Negative for chest pain, palpitations and leg swelling.  Gastrointestinal: Negative for abdominal pain.  Neurological: Negative for headaches.  All other systems reviewed and are negative.    Physical Exam Updated Vital Signs BP 133/79 (BP Location: Left Arm)   Pulse 64   Temp 98.4 F (36.9 C) (Oral)   Resp 17   Ht 5\' 8"  (1.727 m)   Wt (!) 151.5 kg   LMP 04/20/2011   SpO2 100%   BMI 50.78 kg/m   Physical Exam Vitals signs and nursing note reviewed.  Constitutional:      General: She is not in acute distress.    Appearance: Normal appearance. She is normal weight.  HENT:     Head: Normocephalic and atraumatic.     Nose: Nose normal.     Mouth/Throat:     Mouth: Mucous membranes are moist.     Pharynx: Oropharynx is clear.  Eyes:     Conjunctiva/sclera: Conjunctivae normal.     Pupils: Pupils are equal, round, and reactive to light.  Neck:     Musculoskeletal: Normal range of motion and neck supple.  Cardiovascular:     Pulses: Normal pulses.     Heart sounds: Normal heart sounds.  Pulmonary:     Effort: Pulmonary effort is normal. No respiratory distress.     Breath sounds: Normal breath sounds. No stridor. No wheezing, rhonchi or rales.  Chest:     Chest wall: No tenderness.  Abdominal:     General: Abdomen is flat. Bowel sounds are normal.     Tenderness: There is no abdominal tenderness. There is no guarding or rebound.  Musculoskeletal: Normal range of motion.  Skin:    General: Skin is warm and dry.     Capillary Refill: Capillary refill takes less than 2 seconds.  Neurological:     General: No focal deficit present.     Mental Status: She is alert and oriented to person, place, and time.  Psychiatric:        Mood and Affect: Mood normal.        Behavior: Behavior normal.      ED Treatments / Results   Labs (all labs ordered are listed, but only abnormal results are displayed) Labs Reviewed  CBC WITH DIFFERENTIAL/PLATELET  BASIC METABOLIC PANEL  TROPONIN I    EKG EKG Interpretation  Date/Time:  Friday March 12 2019 02:17:50 EDT Ventricular Rate:  70 PR Interval:    QRS Duration: 85 QT Interval:  382 QTC Calculation: 413 R Axis:   62 Text Interpretation:  Sinus rhythm Confirmed by  Randal Buba Lexia Vandevender [id number]) on 03/12/2019 2:39:03 AM   Radiology Dg Chest 2 View  Result Date: 03/12/2019 CLINICAL DATA:  58 year old female with shortness of breath, asthma. EXAM: CHEST - 2 VIEW COMPARISON:  05/20/2018 and earlier. FINDINGS: Stable lung volumes and borderline to mild cardiomegaly since 2018. Other mediastinal contours are within normal limits. Visualized tracheal air column is within normal limits. No pneumothorax, pulmonary edema, pleural effusion or confluent pulmonary opacity. Borderline to mild increased interstitial markings are stable. No acute osseous abnormality identified. Prior cervical ACDF. Negative visible bowel gas pattern. IMPRESSION: No acute cardiopulmonary abnormality. Electronically Signed   By: Genevie Ann M.D.   On: 03/12/2019 01:58    Procedures Procedures (including critical care time)  Medications Ordered in ED Medications  albuterol (PROVENTIL HFA;VENTOLIN HFA) 108 (90 Base) MCG/ACT inhaler 2 puff (2 puffs Inhalation Given 03/12/19 0210)  AeroChamber Plus Flo-Vu Medium MISC 1 each (1 each Other Given 03/12/19 0210)      HEART score is 1 low risk for MACE. Ordered troponin but patient refused blood work.  MDI given.   Final Clinical Impressions(s) / ED Diagnoses    Return for intractable cough, coughing up blood,fevers >100.4 unrelieved by medication, shortness of breath, intractable vomiting, chest pain, shortness of breath, weakness,numbness, changes in speech, facial asymmetry,intractableabdominal pain, passing out,Inability to tolerate liquids or food,  cough, altered mental status or any concerns. No signs of systemic illness or infection. The patient is nontoxic-appearing on exam and vital signs are within normal limits.   I have reviewed the triage vital signs and the nursing notes. Pertinent labs &imaging results that were available during my care of the patient were reviewed by me and considered in my medical decision making (see chart for details).  After history, exam, and medical workup I feel the patient has been appropriately medically screened and is safe for discharge home. Pertinent diagnoses were discussed with the patient. Patient was given return precautions.    Angelice Piech, MD 03/12/19 2423    Veatrice Kells, MD 03/12/19 [id number]

## 2019-03-22 ENCOUNTER — Telehealth (INDEPENDENT_AMBULATORY_CARE_PROVIDER_SITE_OTHER): Payer: Self-pay | Admitting: Radiology

## 2019-03-22 NOTE — Telephone Encounter (Signed)
Called and spoke with patient, patient answered NO to all pre screening questions.  

## 2019-03-23 ENCOUNTER — Ambulatory Visit (INDEPENDENT_AMBULATORY_CARE_PROVIDER_SITE_OTHER): Payer: Medicare Other | Admitting: Orthopaedic Surgery

## 2019-03-23 ENCOUNTER — Ambulatory Visit (INDEPENDENT_AMBULATORY_CARE_PROVIDER_SITE_OTHER): Payer: Medicare Other

## 2019-03-23 ENCOUNTER — Other Ambulatory Visit: Payer: Self-pay

## 2019-03-23 ENCOUNTER — Encounter (INDEPENDENT_AMBULATORY_CARE_PROVIDER_SITE_OTHER): Payer: Self-pay | Admitting: Orthopaedic Surgery

## 2019-03-23 DIAGNOSIS — R2242 Localized swelling, mass and lump, left lower limb: Secondary | ICD-10-CM | POA: Diagnosis not present

## 2019-03-23 DIAGNOSIS — M25562 Pain in left knee: Secondary | ICD-10-CM | POA: Diagnosis not present

## 2019-03-23 DIAGNOSIS — G8929 Other chronic pain: Secondary | ICD-10-CM

## 2019-03-23 DIAGNOSIS — Z96652 Presence of left artificial knee joint: Secondary | ICD-10-CM

## 2019-03-23 NOTE — Progress Notes (Signed)
Office Visit Note   Patient: [name]           Date of Birth: [birth year]           MRN: [id number] Visit Date: 03/23/2019              Requested by: Nolene Ebbs, MD [address] [place], [place] [address] PCP: Nolene Ebbs, MD   Assessment & Plan: Visit Diagnoses:  1. Chronic pain of left knee   2. Status post total left knee replacement   3. Knee mass, left     Plan: I would like to send her to Dr. Junius Roads as a consultation for considering assessment of the popliteal area of her left knee under ultrasound and potentially being able to ascertain whether or not this can be localized with a needle to aspirate any fluid if needed.  This would also help [name] determine whether or not this is just adipose tissue.  I explained this to the patient as well and all questions concerns were answered and addressed.  She was pleased to have an appointment set up to have this assessed under ultrasound.  Follow-Up Instructions: Will arrange an appointment with Dr. Junius Roads  Orders:  Orders Placed This Encounter  Procedures  . XR Knee 1-2 Views Left   No orders of the defined types were placed in this encounter.     Procedures: No procedures performed   Clinical Data: No additional findings.   Subjective: Chief Complaint  Patient presents with  . Left Knee - Follow-up  The patient is a very pleasant 58 year old female who is 2 years out from a total knee arthroplasty of her left knee.  She is someone who is morbidly obese and weighs 334 pounds.  She is done well from a knee replacement standpoint but continues to have a painful mass that she feels is there on the back of her knee and she points to the popliteal area.  This was there before her surgery as well.  She has not lost any weight since surgery.  She has known severe arthritis in her right knee but it does not really bother her at all.  Her left knee in terms of the knee replacement does not hurt.  She has had no recent  illnesses.  HPI  Review of Systems She currently denies any headache, chest pain, shortness of breath, fever, chills, nausea, vomiting  Objective: Vital Signs: LMP 04/20/2011   Physical Exam She is alert and orient x3 and in no acute distress Ortho Exam Examination of her left knee shows a well-healed surgical incision.  The knee feels ligamentously stable.  I had her lay prone on the exam table so I can look at the popliteal area of both knees.  Both knees show some fullness back there and this is likely adipose tissue.  There is a small soft tissue irregularity behind her left operative knee that feels almost as if it is a varicosity versus a small cyst.  She seems to be painful more to palpation and this area and I would say that this area that appears to be adipose tissue may be a little bit more full than the opposite side.  Is difficult to tell if there is a fluid level associated with this. Specialty Comments:  No specialty comments available.  Imaging: Xr Knee 1-2 Views Left  Result Date: 03/23/2019 2 views of the left knee show total knee arthroplasty with no complicating features.  There is no lucency  that can be seen in the soft tissues in the posterior aspect of the knee for the patient feels a knee mass.    PMFS History: Patient Active Problem List   Diagnosis Date Noted  . Status post total left knee replacement 01/17/2017  . Unilateral primary osteoarthritis, left knee 12/05/2016  . Degenerative disc disease, cervical    Past Medical History:  Diagnosis Date  . Anxiety   . Asthma   . DDD (degenerative disc disease), lumbar   . Degenerative disc disease   . Degenerative disc disease, cervical   . Degenerative disc disease, cervical   . Fibromyalgia   . Headache    regular  . Hypertension   . Sleep apnea    mild, patient does not use mask    Family History  Problem Relation Age of Onset  . Cancer Mother   . Diabetes Father   . Heart disease Father   .  Cancer Sister     Past Surgical History:  Procedure Laterality Date  . BACK SURGERY    . NOVASURE ABLATION    . TOTAL KNEE ARTHROPLASTY Left 01/17/2017   Procedure: LEFT TOTAL KNEE ARTHROPLASTY;  Surgeon: Mcarthur Rossetti, MD;  Location: [facility];  Service: Orthopedics;  Laterality: Left;  . TUBAL LIGATION     Social History   Occupational History  . Not on file  Tobacco Use  . Smoking status: Never Smoker  . Smokeless tobacco: Never Used  Substance and Sexual Activity  . Alcohol use: Not Currently  . Drug use: No  . Sexual activity: Yes    Birth control/protection: Surgical

## 2019-03-24 ENCOUNTER — Telehealth (INDEPENDENT_AMBULATORY_CARE_PROVIDER_SITE_OTHER): Payer: Self-pay | Admitting: Radiology

## 2019-03-24 NOTE — Telephone Encounter (Signed)
FYI   Patient answered no to all screening questions, except traveling within state. She has been to [place] within the past 2 weeks visiting a friend.

## 2019-03-25 ENCOUNTER — Ambulatory Visit (INDEPENDENT_AMBULATORY_CARE_PROVIDER_SITE_OTHER): Payer: Medicare Other | Admitting: Family Medicine

## 2019-03-25 ENCOUNTER — Encounter (INDEPENDENT_AMBULATORY_CARE_PROVIDER_SITE_OTHER): Payer: Self-pay | Admitting: Family Medicine

## 2019-03-25 ENCOUNTER — Other Ambulatory Visit: Payer: Self-pay

## 2019-03-25 VITALS — BP 128/86 | HR 50 | Temp 98.2°F | Ht 68.0 in | Wt 332.0 lb

## 2019-03-25 DIAGNOSIS — G8929 Other chronic pain: Secondary | ICD-10-CM

## 2019-03-25 DIAGNOSIS — M25562 Pain in left knee: Secondary | ICD-10-CM

## 2019-03-25 NOTE — Progress Notes (Signed)
Subjective: She is here for limited diagnostic ultrasound of her left posterior knee.  She has pain and soft tissue prominence and is status post total knee replacement.  Musculoskeletal ultrasound left posterior knee: 12 MHz linear probe was used.  She has a lipoma in the subcutaneous tissue.  There is no popliteal cyst, no fluid collection.  Impression: Lipoma left posterior knee.  Plan: No treatment needed, follow-up as needed.

## 2019-06-16 ENCOUNTER — Encounter: Payer: Self-pay | Admitting: Dietician

## 2019-06-16 ENCOUNTER — Other Ambulatory Visit: Payer: Self-pay

## 2019-06-16 ENCOUNTER — Encounter: Payer: Medicare Other | Attending: Internal Medicine | Admitting: Dietician

## 2019-06-16 DIAGNOSIS — I10 Essential (primary) hypertension: Secondary | ICD-10-CM | POA: Diagnosis not present

## 2019-06-16 DIAGNOSIS — E669 Obesity, unspecified: Secondary | ICD-10-CM

## 2019-06-16 DIAGNOSIS — Z6841 Body Mass Index (BMI) 40.0 and over, adult: Secondary | ICD-10-CM | POA: Diagnosis not present

## 2019-06-16 NOTE — Patient Instructions (Signed)
-   Continue to prepare foods by baking, grilling, broiling, etc. and try to avoid fried foods.   - Stay on top of water intake, keep up the good work! Aim for at least 64 ounces per day.   - Keep on eating plenty of fruits and vegetables every day.   - Try out some of the chair exercises if walking or other activity is too painful.  See you next time!

## 2019-06-16 NOTE — Progress Notes (Signed)
Medical Nutrition Therapy  Appt Start Time: 11:20am  End Time: 12:20am  Primary concerns today: weight management   Referral diagnosis: E66.01 Obesity; I10 Hypertension Preferred learning style: no preference indicated Learning readiness: change in progress   NUTRITION ASSESSMENT   Anthropometrics  Weight: 330.8 lbs BMI: 50.3  Biochemical Data & Labs BP: 128/86 Chol: 234 (H) HDL: 68  Trig: 110  LDL: 143 (H) Glucose: 96  Vitamin D: 11 (L)  HgbA1c: 5.7% (borderline prediabetes)   Clinical Medical Hx: chronic pain syndrome, fibromyalgia, chronic airway obstruction, arthritis, sleep apnea, ganglion, obesity, anxiety, hypertension, allergic rhinitis, hyperlipidemia Medications: phentermine, atenolol, hydrochlorothiazide   Psychosocial/Lifestyle Lives with her 11 year old son. States she would like to take better care of herself, now that she is out of an unhealthy relationship during which she did not prioritize her health.   24-Hr Dietary Recall First Meal: banana (or breakfast bar)  Snack: none Second Meal: none Snack: none Third Meal: none Snack: Ritz crackers  Beverages: water, sometimes tea, sometimes juice or strawberry lemonade  Food & Nutrition Related Hx Dietary Hx:  No more fried foods, baked or broiled only. No more sodas, drinks lots of water and sometimes tea and juice. Dairy causes nausea. Been taking phentermine on/off for ~15 years, states she would like to continue taking it. Notices that it curbs her appetite, so she often only eats once or twice per day.   States she is ready to make changes and get weight off. Very uncomfortable being overweight. Recent loss of a few pounds already has made walking easier. Wants to continue taking phentermine to help control appetite. Was told that she should eat no more than 1200 calories per day, but does not know how to count calories or know where to start with that.   Estimated Daily Fluid Intake: 64 oz GI / Other  Notable Symptoms: nausea (most/all foods)    Physical Activity  Current average weekly physical activity: 20 minutes walking 3x/week   Estimated Energy Needs Calories: 1600 Carbohydrate: 180g Protein: 100g Fat: 53g   NUTRITION DIAGNOSIS  Food and nutrition-related knowledge deficit (NB-1.1) related to lack of prior nutrition education as evidenced by questions about food and nutrition, appropriate food choices, and calories.    NUTRITION INTERVENTION  Nutrition education (E-1) on the following topics:  . Energy balance: consuming adequate amounts of food (calories), energy in/ energy out, appropriate weight loss rate (no more than 2 lbs/week)  . Intuitive eating: listening to body's cues of hunger and satiety  . Balanced eating: variety of foods/food groups, focus on whole grains and plenty of fruits/vegetables, eat 3+ times/day for stable energy throughout the day   . Physical activity: heart health, identified activities that are appropriate to accommodate needs  Handouts Provided Include   MyPlate   Meal Ideas   Chair Exercises   Learning Style & Readiness for Change Teaching method utilized: Visual & Auditory  Demonstrated degree of understanding via: Teach Back  Barriers to learning/adherence to lifestyle change: None Identified   Goals Established by Pt . Continue to prepare foods by baking, grilling, broiling, etc. and try to avoid fried foods.  Marland Kitchen Keep on eating plenty of fruits and vegetables every day. . Stay on top of water intake, keep up the good work! Aim for at least 64 ounces per day.  . Try out chair exercises.    MONITORING & EVALUATION Dietary intake, weekly physical activity, and goals in 2 months.  RD's Notes for Next Visit  .  Physical activity goals  . Balanced meals and snacks  . Heart healthy diet goals  . Review the multiple factors that impact weight   Next Steps  Patient is to return to [facility] for follow up visit in 2 months.

## 2019-08-17 ENCOUNTER — Ambulatory Visit: Payer: Medicare Other | Admitting: Dietician

## 2019-10-06 ENCOUNTER — Ambulatory Visit (INDEPENDENT_AMBULATORY_CARE_PROVIDER_SITE_OTHER): Payer: Medicare Other | Admitting: Physician Assistant

## 2019-10-06 ENCOUNTER — Encounter: Payer: Self-pay | Admitting: Physician Assistant

## 2019-10-06 ENCOUNTER — Ambulatory Visit (INDEPENDENT_AMBULATORY_CARE_PROVIDER_SITE_OTHER): Payer: Medicare Other

## 2019-10-06 ENCOUNTER — Other Ambulatory Visit: Payer: Self-pay

## 2019-10-06 DIAGNOSIS — M1711 Unilateral primary osteoarthritis, right knee: Secondary | ICD-10-CM | POA: Diagnosis not present

## 2019-10-06 DIAGNOSIS — M25561 Pain in right knee: Secondary | ICD-10-CM

## 2019-10-06 MED ORDER — LIDOCAINE HCL 1 % IJ SOLN
3.0000 mL | INTRAMUSCULAR | Status: AC | PRN
  Administered 2019-10-06: 3 mL

## 2019-10-06 MED ORDER — METHYLPREDNISOLONE ACETATE 40 MG/ML IJ SUSP
40.0000 mg | INTRAMUSCULAR | Status: AC | PRN
  Administered 2019-10-06: 40 mg via INTRA_ARTICULAR

## 2019-10-06 NOTE — Progress Notes (Signed)
Office Visit Note   Patient: [name]           Date of Birth: Jun 22, 1961           MRN: [id number] Visit Date: 10/06/2019              Requested by: Nolene Ebbs, MD [address] [place],  [place] [address] PCP: Nolene Ebbs, MD   Assessment & Plan: Visit Diagnoses:  1. Right knee pain, unspecified chronicity   2. Primary osteoarthritis of right knee     Plan: She will follow-up with [name] on an as-needed basis.  She understands that she can have cortisone injections no more often than every 3 months.  She may also consider a supplemental injection in the future.  Questions were encouraged and answered.  Follow-Up Instructions: Return if symptoms worsen or fail to improve.   Orders:  Orders Placed This Encounter  Procedures  . Large Joint Inj: R knee  . XR Knee 1-2 Views Right   No orders of the defined types were placed in this encounter.     Procedures: Large Joint Inj: R knee on 10/06/2019 3:22 PM Indications: pain Details: 22 G 1.5 in needle, anterolateral approach  Arthrogram: No  Medications: 3 mL lidocaine 1 %; 40 mg methylPREDNISolone acetate 40 MG/ML Outcome: tolerated well, no immediate complications Procedure, treatment alternatives, risks and benefits explained, specific risks discussed. Consent was given by the patient. Immediately prior to procedure a time out was called to verify the correct patient, procedure, equipment, support staff and site/side marked as required. Patient was prepped and draped in the usual sterile fashion.       Clinical Data: No additional findings.   Subjective: Chief Complaint  Patient presents with  . Right Knee - Pain  . Left Knee - Pain    HPI [name] is a pleasant 58 year old female who is well-known to Dr. Ninfa Linden service.  History of left total knee arthroplasty 01/17/2017.  Left knee overall is doing well some achiness at times.  Complaint today is right knee pain she is requesting injection in  the knee.  She is having anterior knee pain and some weakness of the knee.  She had no new injury.  Having no true mechanical symptoms of the knee. Review of Systems Please see HPI otherwise negative or noncontributory.  Objective: Vital Signs: LMP 04/20/2011   Physical Exam Constitutional:      Appearance: She is not ill-appearing or diaphoretic.  Pulmonary:     Effort: Pulmonary effort is normal.  Neurological:     Mental Status: She is alert and oriented to person, place, and time.  Psychiatric:        Mood and Affect: Mood normal.     Ortho Exam Right knee she has good range of motion.  No instability valgus varus stressing.  Tenderness along medial joint line.  Tello femoral crepitus.  Left knee good range of motion.  Anterior drawer's negative valgus varus stressing negative. Specialty Comments:  No specialty comments available.  Imaging: Xr Knee 1-2 Views Right  Result Date: 10/06/2019 Right knee 2 views: Shows near bone-on-bone medial compartment severe patellofemoral arthritic changes.  Periarticular spurs all 3 compartments.  No acute fractures.  No bony abnormalities otherwise.    PMFS History: Patient Active Problem List   Diagnosis Date Noted  . Status post total left knee replacement 01/17/2017  . Unilateral primary osteoarthritis, left knee 12/05/2016  . Degenerative disc disease, cervical    Past  Medical History:  Diagnosis Date  . Anxiety   . Asthma   . DDD (degenerative disc disease), lumbar   . Degenerative disc disease   . Degenerative disc disease, cervical   . Degenerative disc disease, cervical   . Fibromyalgia   . Headache    regular  . Hypertension   . Sleep apnea    mild, patient does not use mask    Family History  Problem Relation Age of Onset  . Cancer Mother   . Diabetes Father   . Heart disease Father   . Cancer Sister     Past Surgical History:  Procedure Laterality Date  . BACK SURGERY    . NOVASURE ABLATION    . TOTAL  KNEE ARTHROPLASTY Left 01/17/2017   Procedure: LEFT TOTAL KNEE ARTHROPLASTY;  Surgeon: Mcarthur Rossetti, MD;  Location: [facility];  Service: Orthopedics;  Laterality: Left;  . TUBAL LIGATION     Social History   Occupational History  . Not on file  Tobacco Use  . Smoking status: Never Smoker  . Smokeless tobacco: Never Used  Substance and Sexual Activity  . Alcohol use: Not Currently  . Drug use: No  . Sexual activity: Yes    Birth control/protection: Surgical

## 2020-01-06 ENCOUNTER — Encounter: Payer: Self-pay | Admitting: Physician Assistant

## 2020-01-06 ENCOUNTER — Telehealth: Payer: Self-pay | Admitting: Radiology

## 2020-01-06 ENCOUNTER — Ambulatory Visit (INDEPENDENT_AMBULATORY_CARE_PROVIDER_SITE_OTHER): Payer: Medicare Other | Admitting: Physician Assistant

## 2020-01-06 ENCOUNTER — Other Ambulatory Visit: Payer: Self-pay

## 2020-01-06 DIAGNOSIS — M1711 Unilateral primary osteoarthritis, right knee: Secondary | ICD-10-CM | POA: Diagnosis not present

## 2020-01-06 MED ORDER — METHYLPREDNISOLONE ACETATE 40 MG/ML IJ SUSP
40.0000 mg | INTRAMUSCULAR | Status: AC | PRN
  Administered 2020-01-06: 40 mg via INTRA_ARTICULAR

## 2020-01-06 MED ORDER — LIDOCAINE HCL 1 % IJ SOLN
3.0000 mL | INTRAMUSCULAR | Status: AC | PRN
  Administered 2020-01-06: 13:00:00 3 mL

## 2020-01-06 NOTE — Telephone Encounter (Signed)
Right knee supplemental injection 

## 2020-01-06 NOTE — Progress Notes (Signed)
   Procedure Note  Patient: [name]             Date of Birth: 30-Nov-1961           MRN: [id number]             Visit Date: 01/06/2020  HPI: [name] comes in today due to a increasing right knee pain.  She last had a right knee injection on 10/06/2019 states the injection helped.  She is had no new injury to the knee.  She is having constant pain in the knee feels weak she has popping grinding and clicking in the knee.  She is tried ibuprofen for the pain.  She is requesting repeat injection  Review of systems: No fever chills shortness of breath chest pain  Physical exam: Right knee full extension full flexion.  Tenderness along medial joint line.  No instability valgus varus stress erythema.   Procedures: Visit Diagnoses:  1. Primary osteoarthritis of right knee     Large Joint Inj: R knee on 01/06/2020 1:19 PM Indications: pain Details: 22 G 1.5 in needle, anterolateral approach  Arthrogram: No  Medications: 3 mL lidocaine 1 %; 40 mg methylPREDNISolone acetate 40 MG/ML Outcome: tolerated well, no immediate complications Procedure, treatment alternatives, risks and benefits explained, specific risks discussed. Consent was given by the patient. Immediately prior to procedure a time out was called to verify the correct patient, procedure, equipment, support staff and site/side marked as required. Patient was prepped and draped in the usual sterile fashion.     Plan: She will continue to work on strengthening the knee.  She understands she needs to wait least 3 months between injections.  We will try to gain approval for supplemental injection for her knee and then have her back once this is available.  Questions were encouraged and answered

## 2020-01-10 ENCOUNTER — Telehealth: Payer: Self-pay

## 2020-01-10 NOTE — Telephone Encounter (Signed)
submittted for VOB for right knee monovisc injection

## 2020-01-10 NOTE — Telephone Encounter (Signed)
Ok, I will submit it today

## 2020-01-10 NOTE — Telephone Encounter (Signed)
Please submit.  Thank you. 

## 2020-01-11 ENCOUNTER — Telehealth: Payer: Self-pay

## 2020-01-11 NOTE — Telephone Encounter (Signed)
Spoke with the pt and scheduled her for 01/19/20 with dr Ninfa Linden for monovisc injection-right knee

## 2020-01-11 NOTE — Telephone Encounter (Signed)
Approved for Monovisc for right knee  Dr Margarito Liner and [facility] No copay 20% OOP No prior auth required

## 2020-01-19 ENCOUNTER — Ambulatory Visit: Payer: Medicare Other | Admitting: Orthopaedic Surgery

## 2020-01-25 ENCOUNTER — Ambulatory Visit: Payer: Medicare Other | Admitting: Orthopaedic Surgery

## 2020-02-02 ENCOUNTER — Encounter: Payer: Self-pay | Admitting: Orthopaedic Surgery

## 2020-02-02 ENCOUNTER — Ambulatory Visit (INDEPENDENT_AMBULATORY_CARE_PROVIDER_SITE_OTHER): Payer: Medicare Other | Admitting: Orthopaedic Surgery

## 2020-02-02 ENCOUNTER — Other Ambulatory Visit: Payer: Self-pay

## 2020-02-02 DIAGNOSIS — G8929 Other chronic pain: Secondary | ICD-10-CM

## 2020-02-02 DIAGNOSIS — M25561 Pain in right knee: Secondary | ICD-10-CM

## 2020-02-02 DIAGNOSIS — M1711 Unilateral primary osteoarthritis, right knee: Secondary | ICD-10-CM | POA: Diagnosis not present

## 2020-02-02 DIAGNOSIS — M79605 Pain in left leg: Secondary | ICD-10-CM

## 2020-02-02 MED ORDER — HYALURONAN 88 MG/4ML IX SOSY
88.0000 mg | PREFILLED_SYRINGE | INTRA_ARTICULAR | Status: AC | PRN
  Administered 2020-02-02: 10:00:00 88 mg via INTRA_ARTICULAR

## 2020-02-02 MED ORDER — LIDOCAINE HCL 1 % IJ SOLN
3.0000 mL | INTRAMUSCULAR | Status: AC | PRN
  Administered 2020-02-02: 10:00:00 3 mL

## 2020-02-02 NOTE — Progress Notes (Signed)
   Procedure Note  Patient: [name]             Date of Birth: [birth year]           MRN: [id number]             Visit Date: 02/02/2020  Procedures: Visit Diagnoses:  1. Chronic pain of right knee   2. Unilateral primary osteoarthritis, right knee     Large Joint Inj: R knee on 02/02/2020 10:01 AM Indications: diagnostic evaluation and pain Details: 22 G 1.5 in needle, superolateral approach  Arthrogram: No  Medications: 3 mL lidocaine 1 %; 88 mg Hyaluronan 88 MG/4ML Outcome: tolerated well, no immediate complications Procedure, treatment alternatives, risks and benefits explained, specific risks discussed. Consent was given by the patient. Immediately prior to procedure a time out was called to verify the correct patient, procedure, equipment, support staff and site/side marked as required. Patient was prepped and draped in the usual sterile fashion.     The patient comes in today for scheduled hyaluronic acid injection with Monovisc in the right knee to treat the pain from osteoarthritis.  She understands why she is having this injection.  She does have a history of a left total knee arthroplasty.  She does weigh 330 pounds.  She is had no other acute change in her medical status.  She understands why we are trying this injection as well as the risk and benefits involved.  We did place a Monovisc injection in her right knee without difficulty.  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  She is requesting a referral to a vein specialist to see if there is anything they can do for this.  We will work on making that referral.  Also she is requesting outpatient physical therapy to work on strengthening her knees and I agree with this.  We will work on setting that up as well.  All questions and concerns were answered and addressed.  We will make these referrals.  She understands that she can always have a repeat steroid injection in  her knee in 3 to 6 months and a repeat hyaluronic acid injection after 6 months but only if needed.

## 2020-02-17 ENCOUNTER — Ambulatory Visit: Payer: Medicare Other | Admitting: Physical Therapy

## 2020-02-29 ENCOUNTER — Ambulatory Visit: Payer: Medicare Other

## 2020-03-02 ENCOUNTER — Ambulatory Visit: Payer: Medicare Other | Attending: Orthopaedic Surgery

## 2020-03-20 ENCOUNTER — Other Ambulatory Visit: Payer: Self-pay | Admitting: *Deleted

## 2020-03-20 DIAGNOSIS — M25561 Pain in right knee: Secondary | ICD-10-CM

## 2020-03-21 ENCOUNTER — Encounter: Payer: Medicare Other | Admitting: Vascular Surgery

## 2020-03-21 ENCOUNTER — Inpatient Hospital Stay (HOSPITAL_COMMUNITY): Admission: RE | Admit: 2020-03-21 | Payer: Medicare Other | Source: Ambulatory Visit

## 2020-03-23 ENCOUNTER — Encounter: Payer: Self-pay | Admitting: Surgery

## 2020-05-02 ENCOUNTER — Encounter (HOSPITAL_COMMUNITY): Payer: Medicare Other

## 2020-05-02 ENCOUNTER — Encounter: Payer: Medicare Other | Admitting: Vascular Surgery

## 2020-05-23 ENCOUNTER — Encounter: Payer: Self-pay | Admitting: Orthopaedic Surgery

## 2020-05-23 ENCOUNTER — Other Ambulatory Visit: Payer: Self-pay

## 2020-05-23 ENCOUNTER — Ambulatory Visit (INDEPENDENT_AMBULATORY_CARE_PROVIDER_SITE_OTHER): Payer: Medicare Other | Admitting: Orthopaedic Surgery

## 2020-05-23 DIAGNOSIS — M1711 Unilateral primary osteoarthritis, right knee: Secondary | ICD-10-CM

## 2020-05-23 DIAGNOSIS — R634 Abnormal weight loss: Secondary | ICD-10-CM

## 2020-05-23 MED ORDER — METHYLPREDNISOLONE ACETATE 40 MG/ML IJ SUSP
40.0000 mg | INTRAMUSCULAR | Status: AC | PRN
  Administered 2020-05-23: 40 mg via INTRA_ARTICULAR

## 2020-05-23 MED ORDER — LIDOCAINE HCL 1 % IJ SOLN
3.0000 mL | INTRAMUSCULAR | Status: AC | PRN
  Administered 2020-05-23: 3 mL

## 2020-05-23 NOTE — Progress Notes (Addendum)
   Procedure Note  Patient: RUKAYA KLEINSCHMIDT             Date of Birth: 1961/07/25           MRN: [id number]             Visit Date: 05/23/2020 HPI: Ms. Hackworth returns today requesting cortisone injection of the right knee.  She last had Monovisc injection right knee 02/02/2020 states it helps some.  She has had no new injury to the knee.  She has known osteoarthritis of the right knee.  She has not had the  Covid vaccine.  She does weigh approximately 330 pounds and states she just cannot lose weight on her own.  Physical exam: Right knee no abnormal warmth erythema.  Good range of motion.   Procedures: Visit Diagnoses:  1. Unilateral primary osteoarthritis, right knee     Large Joint Inj: R knee on 05/23/2020 10:45 AM Indications: pain Details: 22 G 1.5 in needle, anterolateral approach  Arthrogram: No  Medications: 3 mL lidocaine 1 %; 40 mg methylPREDNISolone acetate 40 MG/ML Outcome: tolerated well, no immediate complications Procedure, treatment alternatives, risks and benefits explained, specific risks discussed. Consent was given by the patient. Immediately prior to procedure a time out was called to verify the correct patient, procedure, equipment, support staff and site/side marked as required. Patient was prepped and draped in the usual sterile fashion.    Plan: She understands to wait at least 3 months between cortisone injections in 6 months between Monovisc injections.  We will refer her to weight loss clinic.  Questions were encouraged and answered peer

## 2020-05-29 ENCOUNTER — Telehealth: Payer: Self-pay

## 2020-05-29 NOTE — Telephone Encounter (Signed)
Patient aware Caren Leafy Ro

## 2020-05-29 NOTE — Telephone Encounter (Signed)
Patient called in to get information on weight loss facility that doc ref her to.

## 2020-06-02 ENCOUNTER — Other Ambulatory Visit: Payer: Self-pay | Admitting: *Deleted

## 2020-06-02 DIAGNOSIS — M25562 Pain in left knee: Secondary | ICD-10-CM

## 2020-06-12 ENCOUNTER — Ambulatory Visit (INDEPENDENT_AMBULATORY_CARE_PROVIDER_SITE_OTHER): Payer: Medicare Other | Admitting: Bariatrics

## 2020-06-12 ENCOUNTER — Encounter (INDEPENDENT_AMBULATORY_CARE_PROVIDER_SITE_OTHER): Payer: Self-pay | Admitting: Bariatrics

## 2020-06-12 ENCOUNTER — Other Ambulatory Visit (INDEPENDENT_AMBULATORY_CARE_PROVIDER_SITE_OTHER): Payer: Self-pay | Admitting: Bariatrics

## 2020-06-12 ENCOUNTER — Other Ambulatory Visit: Payer: Self-pay

## 2020-06-12 VITALS — BP 119/79 | HR 60 | Temp 98.3°F | Ht 67.0 in | Wt 339.0 lb

## 2020-06-12 DIAGNOSIS — M549 Dorsalgia, unspecified: Secondary | ICD-10-CM | POA: Diagnosis not present

## 2020-06-12 DIAGNOSIS — R5383 Other fatigue: Secondary | ICD-10-CM | POA: Diagnosis not present

## 2020-06-12 DIAGNOSIS — J45909 Unspecified asthma, uncomplicated: Secondary | ICD-10-CM

## 2020-06-12 DIAGNOSIS — I10 Essential (primary) hypertension: Secondary | ICD-10-CM | POA: Diagnosis not present

## 2020-06-12 DIAGNOSIS — R0602 Shortness of breath: Secondary | ICD-10-CM | POA: Diagnosis not present

## 2020-06-12 DIAGNOSIS — Z6841 Body Mass Index (BMI) 40.0 and over, adult: Secondary | ICD-10-CM

## 2020-06-12 DIAGNOSIS — E538 Deficiency of other specified B group vitamins: Secondary | ICD-10-CM

## 2020-06-12 DIAGNOSIS — Z0289 Encounter for other administrative examinations: Secondary | ICD-10-CM

## 2020-06-12 DIAGNOSIS — M069 Rheumatoid arthritis, unspecified: Secondary | ICD-10-CM

## 2020-06-12 DIAGNOSIS — G8929 Other chronic pain: Secondary | ICD-10-CM

## 2020-06-12 DIAGNOSIS — Z1331 Encounter for screening for depression: Secondary | ICD-10-CM

## 2020-06-12 DIAGNOSIS — E559 Vitamin D deficiency, unspecified: Secondary | ICD-10-CM

## 2020-06-12 DIAGNOSIS — R7309 Other abnormal glucose: Secondary | ICD-10-CM

## 2020-06-13 ENCOUNTER — Encounter (INDEPENDENT_AMBULATORY_CARE_PROVIDER_SITE_OTHER): Payer: Self-pay | Admitting: Bariatrics

## 2020-06-13 ENCOUNTER — Ambulatory Visit (HOSPITAL_COMMUNITY)
Admission: RE | Admit: 2020-06-13 | Discharge: 2020-06-13 | Disposition: A | Discharge status: Home / Self Care | Payer: Medicare Other | Source: Ambulatory Visit | Attending: Vascular Surgery | Admitting: Vascular Surgery

## 2020-06-13 ENCOUNTER — Ambulatory Visit (INDEPENDENT_AMBULATORY_CARE_PROVIDER_SITE_OTHER): Payer: Medicare Other | Admitting: Vascular Surgery

## 2020-06-13 ENCOUNTER — Encounter: Payer: Self-pay | Admitting: Vascular Surgery

## 2020-06-13 DIAGNOSIS — M79605 Pain in left leg: Secondary | ICD-10-CM

## 2020-06-13 DIAGNOSIS — M25562 Pain in left knee: Secondary | ICD-10-CM

## 2020-06-13 DIAGNOSIS — Z6841 Body Mass Index (BMI) 40.0 and over, adult: Secondary | ICD-10-CM | POA: Insufficient documentation

## 2020-06-13 DIAGNOSIS — R7303 Prediabetes: Secondary | ICD-10-CM | POA: Insufficient documentation

## 2020-06-13 DIAGNOSIS — M79606 Pain in leg, unspecified: Secondary | ICD-10-CM | POA: Insufficient documentation

## 2020-06-13 DIAGNOSIS — M25561 Pain in right knee: Secondary | ICD-10-CM | POA: Diagnosis not present

## 2020-06-13 LAB — COMPREHENSIVE METABOLIC PANEL
ALT: 25 IU/L (ref 0–32)
AST: 16 IU/L (ref 0–40)
Albumin/Globulin Ratio: 1.3 (ref 1.2–2.2)
Albumin: 3.9 g/dL (ref 3.8–4.9)
Alkaline Phosphatase: 95 IU/L (ref 48–121)
BUN/Creatinine Ratio: 19 (ref 12–28)
BUN: 17 mg/dL (ref 8–27)
Bilirubin Total: 0.4 mg/dL (ref 0.0–1.2)
CO2: 27 mmol/L (ref 20–29)
Calcium: 9.4 mg/dL (ref 8.7–10.3)
Chloride: 102 mmol/L (ref 96–106)
Creatinine, Ser: 0.89 mg/dL (ref 0.57–1.00)
GFR calc Af Amer: 81 mL/min/{1.73_m2} (ref >59)
GFR calc non Af Amer: 71 mL/min/{1.73_m2} (ref >59)
Globulin, Total: 2.9 g/dL (ref 1.5–4.5)
Glucose: 87 mg/dL (ref 65–99)
Potassium: 4.1 mmol/L (ref 3.5–5.2)
Sodium: 141 mmol/L (ref 134–144)
Total Protein: 6.8 g/dL (ref 6.0–8.5)

## 2020-06-13 LAB — INSULIN, RANDOM: INSULIN: 12.8 u[IU]/mL (ref 2.6–24.9)

## 2020-06-13 LAB — LIPID PANEL WITH LDL/HDL RATIO
Cholesterol, Total: 177 mg/dL (ref 100–199)
HDL: 55 mg/dL (ref >39)
LDL Chol Calc (NIH): 104 mg/dL — ABNORMAL HIGH (ref 0–99)
LDL/HDL Ratio: 1.9 ratio (ref 0.0–3.2)
Triglycerides: 102 mg/dL (ref 0–149)
VLDL Cholesterol Cal: 18 mg/dL (ref 5–40)

## 2020-06-13 LAB — T4, FREE: Free T4: 1.25 ng/dL (ref 0.82–1.77)

## 2020-06-13 LAB — VITAMIN D 25 HYDROXY (VIT D DEFICIENCY, FRACTURES): Vit D, 25-Hydroxy: 31.7 ng/mL (ref 30.0–100.0)

## 2020-06-13 LAB — T3: T3, Total: 97 ng/dL (ref 71–180)

## 2020-06-13 LAB — TSH: TSH: 1.12 u[IU]/mL (ref 0.450–4.500)

## 2020-06-13 LAB — HEMOGLOBIN A1C
Est. average glucose Bld gHb Est-mCnc: 120 mg/dL
Hgb A1c MFr Bld: 5.8 % — ABNORMAL HIGH (ref 4.8–5.6)

## 2020-06-13 NOTE — Progress Notes (Signed)
Dear Dr. Jean Rosenthal,   Thank you for referring [name] to our clinic. The following note includes my evaluation and treatment recommendations.  Chief Complaint:   OBESITY [name] (MR# [id number]) is a 59 y.o. female who presents for evaluation and treatment of obesity and related comorbidities. Current BMI is Body mass index is 53.09 kg/m.[name] has been struggling with her weight for many years and has been unsuccessful in either losing weight, maintaining weight loss, or reaching her healthy weight goal.  [name] is currently in the action stage of change and ready to dedicate time achieving and maintaining a healthier weight. [name] is interested in becoming our patient and working on intensive lifestyle modifications including (but not limited to) diet and exercise for weight loss.  [name] does not like to cook. She craves mostly sweets. She dislikes vegetables. She snacks on cookies.  [name]'s habits were reviewed today and are as follows: Her family eats meals together, her desired weight loss is 107 lbs, she has been heavy most of her life, she started gaining weight at the age of 34, her heaviest weight ever was 339 pounds, she craves mostly sweets, she snacks frequently in the evenings, she skips breakfast daily, she is frequently drinking liquids with calories, she frequently makes poor food choices, she frequently eats larger portions than normal, she has binge eating behaviors and she struggles with emotional eating.  Depression Screen [name]'s Food and Mood (modified PHQ-9) score was 18.  Depression screen PHQ 2/9 06/12/2020  Decreased Interest 3  Down, Depressed, Hopeless 2  PHQ - 2 Score 5  Altered sleeping 2  Tired, decreased energy 3  Change in appetite 3  Feeling bad or failure about yourself  1  Trouble concentrating 2  Moving slowly or fidgety/restless 2  Suicidal thoughts 0  PHQ-9 Score 18  Difficult doing work/chores Somewhat  difficult   Subjective:   Other fatigue. [name] admits to daytime somnolence and denies waking up still tired. Patent has a history of symptoms of daytime fatigue, Epworth sleepiness scale and morning headache. [name] generally gets 5 hours of sleep per night, and states that she has generally restful sleep. Snoring is present. Apneic episodes are not present. Epworth Sleepiness Score is 15.  SOB ([name] of breath) on exertion. [name] of breath with certain activities and seems to be worsening over time with weight gain. She notes getting out of breath sooner with activity than she used to. This has gotten worse recently. [name] denies [name] of breath at rest or orthopnea.  Essential hypertension. [name] is taking losartan-HCTZ and atenolol.  BP Readings from Last 3 Encounters:  06/12/20 119/79  03/25/19 128/86  03/12/19 133/79   Lab Results  Component Value Date   CREATININE 1.09 (H) 07/01/2018   CREATININE 0.83 07/11/2017   CREATININE 0.73 01/18/2017   Chronic back pain, unspecified back location, unspecified back pain laterality. [name] is taking Flexeril, oxycodone, and Ibuprofen.  Asthma, unspecified asthma severity, unspecified whether complicated, unspecified whether persistent. [name] uses Ventolin as needed.  Vitamin D deficiency. [name] is not on Vitamin D supplementation at this time.  Rheumatoid arthritis, involving unspecified site, unspecified whether rheumatoid factor present ([place]). [name] reports joint and back pain.  Folic acid deficiency. [name] is taking folic acid supplementation.  Elevated glucose. [name] is on no medication.  Depression screening. [name] had a strongly positive depression screen with a PHQ-9 score of 18.  Assessment/Plan:   Other fatigue. [name] does feel  that her weight is causing her energy to be lower than it should be. Fatigue may be related to obesity, depression or many other causes. Labs  will be ordered, and in the meanwhile, [name] will focus on self care including making healthy food choices, increasing physical activity and focusing on stress reduction. EKG 12-Lead, Comprehensive metabolic panel, Lipid Panel With LDL/HDL Ratio, TSH, T4, free, T3 testing ordered today.  SOB ([name] of breath) on exertion. [name] does feel that she gets out of breath more easily that she used to when she exercises. [name] [name] of breath appears to be obesity related and exercise induced. She has agreed to work on weight loss and gradually increase exercise to treat her exercise induced [name] of breath. Will continue to monitor closely.  Essential hypertension. [name] is working on healthy weight loss and exercise to improve blood pressure control. We will watch for signs of hypotension as she continues her lifestyle modifications. She will continue her medications as directed. Lipid Panel With LDL/HDL Ratio ordered today.  Chronic back pain, unspecified back location, unspecified back pain laterality. [name] will gradually increase activities and increase her medications per her PCP.  Asthma, unspecified asthma severity, unspecified whether complicated, unspecified whether persistent. [name] will continue her medication as directed.   Vitamin D deficiency. Low Vitamin D level contributes to fatigue and are associated with obesity, breast, and colon cancer. VITAMIN D 25 Hydroxy (Vit-D Deficiency, Fractures) level ordered today.  Rheumatoid arthritis, involving unspecified site, unspecified whether rheumatoid factor present ([name]). [name] will follow-up with her PCP as scheduled and will see the  [facility].  Folic acid deficiency. Folate level ordered today.  Elevated glucose.  Hemoglobin A1c, Insulin, random levels ordered today.  Depression screening. [name] had a positive depression screening. Depression is commonly associated with obesity and often results in emotional  eating behaviors. We will monitor this closely and work on CBT to help improve the non-hunger eating patterns. Referral to Psychology may be required if no improvement is seen as she continues in our clinic.  Class 3 severe obesity with serious comorbidity and body mass index (BMI) of 50.0 to 59.9 in adult, unspecified obesity type ([name]).  [name] is currently in the action stage of change and her goal is to continue with weight loss efforts. I recommend [name] begin the structured treatment plan as follows:  She has agreed to the Category 1 Plan + 100 calories.  She will work on meal planning and intentional eating.   Exercise goals: All adults should avoid inactivity. Some physical activity is better than none, and adults who participate in any amount of physical activity gain some health benefits.   Behavioral modification strategies: increasing lean protein intake, decreasing simple carbohydrates, increasing vegetables, increasing water intake, decreasing eating out, no skipping meals, meal planning and cooking strategies, keeping healthy foods in the home and planning for success.  She was informed of the importance of frequent follow-up visits to maximize her success with intensive lifestyle modifications for her multiple health conditions. She was informed we would discuss her lab results at her next visit unless there is a critical issue that needs to be addressed sooner. [name] agreed to keep her next visit at the agreed upon time to discuss these results.  Objective:   Blood pressure 119/79, pulse 60, temperature 98.3 F (36.8 C), height 5\' 7"  (1.702 m), weight (!) 339 lb (153.8 kg), last menstrual period 04/20/2011, SpO2 97 %. Body mass index is 53.09 kg/m.  EKG: Sinus  Bradycardia with a rate of 56 BPM. Within normal limits.   Indirect Calorimeter completed today shows a VO2 of 225 and a REE of 1568.  Her calculated basal metabolic rate is 0321 thus her basal metabolic rate is  worse than expected.  General: Cooperative, alert, well developed, in no acute distress. HEENT: Conjunctivae and lids unremarkable. Cardiovascular: Regular rhythm.  Lungs: Normal work of breathing. Neurologic: No focal deficits.   Lab Results  Component Value Date   CREATININE 1.09 (H) 07/01/2018   BUN 22 (H) 07/01/2018   NA 142 07/01/2018   K 3.5 07/01/2018   CL 102 07/01/2018   CO2 28 07/01/2018   Lab Results  Component Value Date   ALT 14 03/09/2008   AST 13 03/09/2008   ALKPHOS 49 03/09/2008   BILITOT 0.4 03/09/2008   Lab Results  Component Value Date   HGBA1C 5.8 (H) 06/12/2020   No results found for: INSULIN Lab Results  Component Value Date   TSH 1.166 09/21/2015   Lab Results  Component Value Date   CHOL 181 05/09/2010   HDL 58 05/09/2010   LDLCALC 92 05/09/2010   TRIG 155 (H) 05/09/2010   CHOLHDL 3.1 Ratio 05/09/2010   Lab Results  Component Value Date   WBC 7.2 07/01/2018   HGB 14.0 07/01/2018   HCT 40.9 07/01/2018   MCV 91.3 07/01/2018   PLT 406 (H) 07/01/2018   Lab Results  Component Value Date   IRON 93 04/13/2010   TIBC 356 04/13/2010   Obesity Behavioral Intervention Visit Documentation for Insurance:   Approximately 15 minutes were spent on the discussion below.  ASK: We discussed the diagnosis of obesity with [name] today and [name] agreed to give [name] permission to discuss obesity behavioral modification therapy today.  ASSESS: [name] has the diagnosis of obesity and her BMI today is 53.1. [name] is in the action stage of change.   ADVISE: [name] was educated on the multiple health risks of obesity as well as the benefit of weight loss to improve her health. She was advised of the need for long term treatment and the importance of lifestyle modifications to improve her current health and to decrease her risk of future health problems.  AGREE: Multiple dietary modification options and treatment options were discussed and Nara  agreed to follow the recommendations documented in the above note.  ARRANGE: Daylynn was educated on the importance of frequent visits to treat obesity as outlined per CMS and USPSTF guidelines and agreed to schedule her next follow up appointment today.  Attestation Statements:   Reviewed by clinician on day of visit: allergies, medications, problem list, medical history, surgical history, family history, social history, and previous encounter notes.  Migdalia Dk, am acting as Location manager for [employer], DO   I have reviewed the above documentation for accuracy and completeness, and I agree with the above. Jearld Lesch, DO

## 2020-06-13 NOTE — Progress Notes (Signed)
Patient name: [name] MRN: [id number] DOB: 1961-08-04 Sex: female  REASON FOR CONSULT: Evaluate varicose vein left leg  HPI: [name] is a 59 y.o. female, with fibromyalgia, DDD, and chronic pain that presents for evaluation of suspected varicosity in her left leg.  She states that this has been there for at least 15 years and only started causing her problems in the last year or so.  She states she ultimately had knee replacement here and then showed this to her orthopedic surgeon who suspected it may be a varicose vein.  She is on disability due to chronic back pain and joint pain and states she cannot stand for prolonged periods of time.  She spends most of her time sitting.  She does not remember any other vein interventions on her lower extremities.  Does not wear compression.  Only other complaints in joint pain besides pain behind left knee.   Past Medical History:  Diagnosis Date  . Anxiety   . Asthma   . Back pain   . Chronic pain   . DDD (degenerative disc disease), lumbar   . Degenerative disc disease   . Degenerative disc disease, cervical   . Degenerative disc disease, cervical   . Fibromyalgia   . Headache    regular  . Heartburn   . Hypertension   . Joint pain   . Localized swelling of both lower legs   . Osteoarthritis   . Other specified disorders of thyroid   . Rheumatoid arthritis ([place])   . Sleep apnea    mild, patient does not use mask  . SOB (shortness of breath)     Past Surgical History:  Procedure Laterality Date  . BACK SURGERY    . NOVASURE ABLATION    . TOTAL KNEE ARTHROPLASTY Left 01/17/2017   Procedure: LEFT TOTAL KNEE ARTHROPLASTY;  Surgeon: Mcarthur Rossetti, MD;  Location: [facility];  Service: Orthopedics;  Laterality: Left;  . TUBAL LIGATION      Family History  Problem Relation Age of Onset  . Cancer Mother   . Hypertension Mother   . Eating disorder Mother   . Obesity Mother   . Diabetes Father   . Heart disease  Father   . High Cholesterol Father   . Kidney disease Father   . Alcoholism Father   . Eating disorder Father   . Cancer Sister     SOCIAL HISTORY: Social History   Socioeconomic History  . Marital status: Single    Spouse name: Not on file  . Number of children: Not on file  . Years of education: Not on file  . Highest education level: Not on file  Occupational History  . Occupation: n/a  Tobacco Use  . Smoking status: Never Smoker  . Smokeless tobacco: Never Used  Vaping Use  . Vaping Use: Never used  Substance and Sexual Activity  . Alcohol use: Not Currently  . Drug use: No  . Sexual activity: Yes    Birth control/protection: Surgical  Other Topics Concern  . Not on file  Social History Narrative  . Not on file   Social Determinants of Health   Financial Resource Strain:   . Difficulty of Paying Living Expenses:   Food Insecurity:   . Worried About Charity fundraiser in the Last Year:   . Arboriculturist in the Last Year:   Transportation Needs:   . Film/video editor (Medical):   Marland Kitchen  Lack of Transportation (Non-Medical):   Physical Activity:   . Days of Exercise per Week:   . Minutes of Exercise per Session:   Stress:   . Feeling of Stress :   Social Connections:   . Frequency of Communication with Friends and Family:   . Frequency of Social Gatherings with Friends and Family:   . Attends Religious Services:   . Active Member of Clubs or Organizations:   . Attends Archivist Meetings:   Marland Kitchen Marital Status:   Intimate Partner Violence:   . Fear of Current or Ex-Partner:   . Emotionally Abused:   Marland Kitchen Physically Abused:   . Sexually Abused:     No Known Allergies  Current Outpatient Medications  Medication Sig Dispense Refill  . albuterol (PROVENTIL HFA;VENTOLIN HFA) 108 (90 BASE) MCG/ACT inhaler Inhale 2 puffs into the lungs every 6 (six) hours as needed for shortness of breath.     Marland Kitchen albuterol (PROVENTIL) (2.5 MG/3ML) 0.083% nebulizer  solution Take 2.5 mg by nebulization every 6 (six) hours as needed for wheezing or shortness of breath.    Marland Kitchen atenolol (TENORMIN) 100 MG tablet Take 100 mg by mouth daily.     . cetirizine (ZYRTEC) 10 MG tablet Take 10 mg by mouth daily as needed for allergies.    . cyclobenzaprine (FLEXERIL) 10 MG tablet Take 10 mg by mouth 3 (three) times daily as needed for muscle spasms.    Marland Kitchen FLOVENT HFA 220 MCG/ACT inhaler USE 2 PUFFS TWICE A DAY  5  . folic acid (FOLVITE) 1 MG tablet Take 1 mg by mouth daily.    . furosemide (LASIX) 20 MG tablet Take 20 mg by mouth daily as needed for fluid or edema.    Marland Kitchen ibuprofen (ADVIL,MOTRIN) 800 MG tablet Take 800 mg by mouth 3 (three) times daily as needed for moderate pain.   5  . losartan-hydrochlorothiazide (HYZAAR) 100-25 MG per tablet Take 1 tablet by mouth daily.    . Oxycodone HCl 20 MG TABS TAKE 1 TABLET BY MOUTH EVERY 4-6 HOURS (MAX OF 5 PER DAY)  0   No current facility-administered medications for this visit.    REVIEW OF SYSTEMS:  [X]  denotes positive finding, [ ]  denotes negative finding Cardiac  Comments:  Chest pain or chest pressure:    Shortness of breath upon exertion:    Short of breath when lying flat:    Irregular heart rhythm:        Vascular    Pain in calf, thigh, or hip brought on by ambulation:    Pain in feet at night that wakes you up from your sleep:     Blood clot in your veins:    Leg swelling:         Pulmonary    Oxygen at home:    Productive cough:     Wheezing:         Neurologic    Sudden weakness in arms or legs:     Sudden numbness in arms or legs:     Sudden onset of difficulty speaking or slurred speech:    Temporary loss of vision in one eye:     Problems with dizziness:         Gastrointestinal    Blood in stool:     Vomited blood:         Genitourinary    Burning when urinating:     Blood in urine:  Psychiatric    Major depression:         Hematologic    Bleeding problems:    Problems  with blood clotting too easily:        Skin    Rashes or ulcers:        Constitutional    Fever or chills:      PHYSICAL EXAM: Vitals:   06/13/20 1435  BP: 114/70  Pulse: (!) 55  Resp: 14  Temp: 97.7 F (36.5 C)  TempSrc: Temporal  SpO2: 99%  Weight: (!) 340 lb (154.2 kg)  Height: 5\' 7"  (1.702 m)    GENERAL: The patient is a well-nourished female, in no acute distress. The vital signs are documented above. CARDIAC: There is a regular rate and rhythm.  VASCULAR:  Area of pain pictured below behind left knee - no obvious varicosity DP palpable bilaterally in feet  No large varicosities or skin thickening on exam consistent with venous disease PULMONARY: There is good air exchange bilaterally without wheezing or rales. ABDOMEN: Soft and non-tender with normal pitched bowel sounds.  MUSCULOSKELETAL: There are no major deformities or cyanosis. NEUROLOGIC: No focal weakness or paresthesias are detected. SKIN: There are no ulcers or rashes noted. PSYCHIATRIC: The patient has a normal affect.  DATA:       Assessment/Plan:  59 year old female referred for evaluation of possible varicose vein behind her left knee. She had a reflux study today and there is no evidence of long segment reflux in the greater saphenous vein or small saphenous vein.  I do not think this is a varicosity and I discussed this with the patient.  I did review several images that the vascular lab took of this area as well and I do not see any obvious vascular etiology.  Discussed no role for surgical intervention.   Always happy to reevaluate in future if something changes.   Marty Heck, MD [facility] Office: [phone]

## 2020-06-26 ENCOUNTER — Encounter (INDEPENDENT_AMBULATORY_CARE_PROVIDER_SITE_OTHER): Payer: Self-pay | Admitting: Bariatrics

## 2020-06-26 ENCOUNTER — Other Ambulatory Visit: Payer: Self-pay

## 2020-06-26 ENCOUNTER — Ambulatory Visit (INDEPENDENT_AMBULATORY_CARE_PROVIDER_SITE_OTHER): Payer: Medicare Other | Admitting: Bariatrics

## 2020-06-26 VITALS — BP 120/82 | HR 57 | Temp 98.3°F | Ht 67.0 in | Wt 337.0 lb

## 2020-06-26 DIAGNOSIS — E559 Vitamin D deficiency, unspecified: Secondary | ICD-10-CM

## 2020-06-26 DIAGNOSIS — I10 Essential (primary) hypertension: Secondary | ICD-10-CM | POA: Diagnosis not present

## 2020-06-26 DIAGNOSIS — R7303 Prediabetes: Secondary | ICD-10-CM

## 2020-06-26 DIAGNOSIS — Z6841 Body Mass Index (BMI) 40.0 and over, adult: Secondary | ICD-10-CM

## 2020-06-26 MED ORDER — VITAMIN D (ERGOCALCIFEROL) 1.25 MG (50000 UNIT) PO CAPS: 50000.0000 [IU] | ORAL_CAPSULE | ORAL | 0 refills | Status: DC

## 2020-06-27 ENCOUNTER — Encounter (INDEPENDENT_AMBULATORY_CARE_PROVIDER_SITE_OTHER): Payer: Self-pay | Admitting: Bariatrics

## 2020-06-27 NOTE — Progress Notes (Signed)
Chief Complaint:   OBESITY [name] is here to discuss her progress with her obesity treatment plan along with follow-up of her obesity related diagnoses. [name] is on the Category 1 Plan + 100 calories and states she is following her eating plan approximately 80% of the time. [name] states she is exercising 0 minutes 0 times per week.  Today's visit was #: 2 Starting weight: 339 lbs Starting date: 06/12/2020 Today's weight: 337 lbs Today's date: 06/26/2020 Total lbs lost to date: 2 Total lbs lost since last in-office visit: 2  Interim History: [name] is down 2 lbs. She states the first week was hard, but is doing better. She is not drinking soda and is avoiding fried food.  Subjective:   Prediabetes. [name] has a diagnosis of prediabetes based on her elevated HgA1c and was informed this puts her at greater risk of developing diabetes. She continues to work on diet and exercise to decrease her risk of diabetes. She denies nausea or hypoglycemia. [name] is on no medication.  Lab Results  Component Value Date   HGBA1C 5.8 (H) 06/12/2020   Lab Results  Component Value Date   INSULIN 12.8 06/12/2020   Vitamin D deficiency. [name] is not on Vitamin D supplementation.    Ref. Range 06/12/2020 14:43  Vitamin D, 25-Hydroxy Latest Ref Range: 30.0 - 100.0 ng/mL 31.7   Essential hypertension. [name] is taking Hyzaar, Tenormin, and Lasix. Blood pressure is controlled.  BP Readings from Last 3 Encounters:  06/26/20 120/82  06/13/20 114/70  06/12/20 119/79   Lab Results  Component Value Date   CREATININE 0.89 06/12/2020   CREATININE 1.09 (H) 07/01/2018   CREATININE 0.83 07/11/2017   Assessment/Plan:   Prediabetes. [name] will continue to work on weight loss, exercise, increasing healthy fats and protein, and decreasing simple carbohydrates to help decrease the risk of diabetes.   Vitamin D deficiency. Low Vitamin D level contributes to fatigue and are  associated with obesity, breast, and colon cancer. She was given a prescription for Vitamin D @50 ,000 IU every week #4 with 0 refills and will follow-up for routine testing of Vitamin D, at least 2-3 times per year to avoid over-replacement.  Essential hypertension. [name] is working on healthy weight loss and exercise to improve blood pressure control. We will watch for signs of hypotension as she continues her lifestyle modifications. She will continue her medications as directed.  Class 3 severe obesity with serious comorbidity and body mass index (BMI) of 50.0 to 59.9 in adult, unspecified obesity type ([name]).  [name] is currently in the action stage of change. As such, her goal is to continue with weight loss efforts. She has agreed to the Category 1 Plan + 100 calories.  She will work on meal planning and intentional eating.   We independently reviewed with the patient labs from 06/12/2020 including CMP, lipids, Vitamin D, A1c, insulin, and thyroid panel.   Exercise goals: All adults should avoid inactivity. Some physical activity is better than none, and adults who participate in any amount of physical activity gain some health benefits.  Behavioral modification strategies: increasing lean protein intake, decreasing simple carbohydrates, increasing vegetables, increasing water intake, decreasing eating out, no skipping meals, meal planning and cooking strategies, keeping healthy foods in the home and planning for success.  [name] has agreed to follow-up with our clinic in 2 weeks. She was informed of the importance of frequent follow-up visits to maximize her success with intensive lifestyle modifications for her  multiple health conditions.   Objective:   Blood pressure 120/82, pulse (!) 57, temperature 98.3 F (36.8 C), height 5\' 7"  (1.702 m), weight (!) 337 lb (152.9 kg), last menstrual period 04/20/2011, SpO2 100 %. Body mass index is 52.78 kg/m.  General: Cooperative, alert,  well developed, in no acute distress. HEENT: Conjunctivae and lids unremarkable. Cardiovascular: Regular rhythm.  Lungs: Normal work of breathing. Neurologic: No focal deficits.   Lab Results  Component Value Date   CREATININE 0.89 06/12/2020   BUN 17 06/12/2020   NA 141 06/12/2020   K 4.1 06/12/2020   CL 102 06/12/2020   CO2 27 06/12/2020   Lab Results  Component Value Date   ALT 25 06/12/2020   AST 16 06/12/2020   ALKPHOS 95 06/12/2020   BILITOT 0.4 06/12/2020   Lab Results  Component Value Date   HGBA1C 5.8 (H) 06/12/2020   Lab Results  Component Value Date   INSULIN 12.8 06/12/2020   Lab Results  Component Value Date   TSH 1.120 06/12/2020   Lab Results  Component Value Date   CHOL 177 06/12/2020   HDL 55 06/12/2020   LDLCALC 104 (H) 06/12/2020   TRIG 102 06/12/2020   CHOLHDL 3.1 Ratio 05/09/2010   Lab Results  Component Value Date   WBC 7.2 07/01/2018   HGB 14.0 07/01/2018   HCT 40.9 07/01/2018   MCV 91.3 07/01/2018   PLT 406 (H) 07/01/2018   Lab Results  Component Value Date   IRON 93 04/13/2010   TIBC 356 04/13/2010   Obesity Behavioral Intervention Documentation for Insurance:   Approximately 15 minutes were spent on the discussion below.  ASK: We discussed the diagnosis of obesity with [name] today and [name] agreed to give [name] permission to discuss obesity behavioral modification therapy today.  ASSESS: [name] has the diagnosis of obesity and her BMI today is 52.9. [name] is in the action stage of change.   ADVISE: [name] was educated on the multiple health risks of obesity as well as the benefit of weight loss to improve her health. She was advised of the need for long term treatment and the importance of lifestyle modifications to improve her current health and to decrease her risk of future health problems.  AGREE: Multiple dietary modification options and treatment options were discussed and [name] agreed to follow the  recommendations documented in the above note.  ARRANGE: [name] was educated on the importance of frequent visits to treat obesity as outlined per CMS and USPSTF guidelines and agreed to schedule her next follow up appointment today.  Attestation Statements:   Reviewed by clinician on day of visit: allergies, medications, problem list, medical history, surgical history, family history, social history, and previous encounter notes.  Migdalia Dk, am acting as Location manager for [employer], DO   I have reviewed the above documentation for accuracy and completeness, and I agree with the above. Jearld Lesch, DO

## 2020-07-11 ENCOUNTER — Other Ambulatory Visit: Payer: Self-pay

## 2020-07-11 ENCOUNTER — Encounter (INDEPENDENT_AMBULATORY_CARE_PROVIDER_SITE_OTHER): Payer: Self-pay | Admitting: Bariatrics

## 2020-07-11 ENCOUNTER — Ambulatory Visit (INDEPENDENT_AMBULATORY_CARE_PROVIDER_SITE_OTHER): Payer: Medicare Other | Admitting: Bariatrics

## 2020-07-11 VITALS — BP 135/80 | HR 57 | Temp 97.7°F | Ht 67.0 in | Wt 336.0 lb

## 2020-07-11 DIAGNOSIS — E559 Vitamin D deficiency, unspecified: Secondary | ICD-10-CM

## 2020-07-11 DIAGNOSIS — Z6841 Body Mass Index (BMI) 40.0 and over, adult: Secondary | ICD-10-CM | POA: Diagnosis not present

## 2020-07-11 DIAGNOSIS — F3289 Other specified depressive episodes: Secondary | ICD-10-CM

## 2020-07-11 DIAGNOSIS — I10 Essential (primary) hypertension: Secondary | ICD-10-CM

## 2020-07-11 MED ORDER — TOPIRAMATE 50 MG PO TABS
ORAL_TABLET | ORAL | 0 refills | Status: DC
Start: 2020-07-11 — End: 2020-08-08

## 2020-07-12 ENCOUNTER — Encounter (INDEPENDENT_AMBULATORY_CARE_PROVIDER_SITE_OTHER): Payer: Self-pay | Admitting: Bariatrics

## 2020-07-12 NOTE — Progress Notes (Signed)
Chief Complaint:   OBESITY [name] is here to discuss her progress with her obesity treatment plan along with follow-up of her obesity related diagnoses. [name] is on the Category 1 Plan + 100 calories and states she is following her eating plan approximately 80% of the time. [name] states she is walking 100 steps a day 5 times per week.  Today's visit was #: 3 Starting weight: 339 lbs Starting date: 06/12/2020 Today's weight: 336 lbs Today's date: 07/11/2020 Total lbs lost to date: 3 Total lbs lost since last in-office visit: 1  Interim History: [name] is down 1 additional lb since her last visit. She takes Phentermine per her PCP and states she is eating better.  Subjective:   Essential hypertension. [name] is taking atenolol and Hyzaar. Blood pressure is controlled.  BP Readings from Last 3 Encounters:  07/11/20 (!) 135/80  06/26/20 120/82  06/13/20 114/70   Lab Results  Component Value Date   CREATININE 0.89 06/12/2020   CREATININE 1.09 (H) 07/01/2018   CREATININE 0.83 07/11/2017   Vitamin D deficiency. [name] is taking OTC Vitamin D supplementation.    Ref. Range 06/12/2020 14:43  Vitamin D, 25-Hydroxy Latest Ref Range: 30.0 - 100.0 ng/mL 31.7   Other depression, with emotional eating. [name] is struggling with emotional eating and using food for comfort to the extent that it is negatively impacting her health. She has been working on behavior modification techniques to help reduce her emotional eating and has been somewhat successful. She shows no sign of suicidal or homicidal ideations. [name] endorses nighttime eating.  Assessment/Plan:   Essential hypertension. [name] is working on healthy weight loss and exercise to improve blood pressure control. We will watch for signs of hypotension as she continues her lifestyle modifications. She will continue her medications as directed.   Vitamin D deficiency. Low Vitamin D level contributes to fatigue  and are associated with obesity, breast, and colon cancer. She agrees to continue to take OTC Vitamin D as directed and will follow-up for routine testing of Vitamin D, at least 2-3 times per year to avoid over-replacement.  Other depression, with emotional eating. Behavior modification techniques were discussed today to help [name] deal with her emotional/non-hunger eating behaviors.  Orders and follow up as documented in patient record. Prescription was given for Topamax 50 mg 1 PO with evening meal #30 with 0 refills.  Class 3 severe obesity with serious comorbidity and body mass index (BMI) of 50.0 to 59.9 in adult, unspecified obesity type ([place]).  [name] is currently in the action stage of change. As such, her goal is to continue with weight loss efforts. She has agreed to the Category 1 Plan + 100  Calories.  She will work on meal planning and intentional eating.    Exercise goals: All adults should avoid inactivity. Some physical activity is better than none, and adults who participate in any amount of physical activity gain some health benefits.  Behavioral modification strategies: increasing lean protein intake, decreasing simple carbohydrates, increasing vegetables, increasing water intake, decreasing eating out, no skipping meals, meal planning and cooking strategies, keeping healthy foods in the home and planning for success.  [name] has agreed to follow-up with our clinic in 2-3 weeks. She was informed of the importance of frequent follow-up visits to maximize her success with intensive lifestyle modifications for her multiple health conditions.   Objective:   Blood pressure (!) 135/80, pulse 57, temperature 97.7 F (36.5 C), height 5\' 7"  (1.702  m), weight (!) 336 lb (152.4 kg), last menstrual period 04/20/2011, SpO2 100 %. Body mass index is 52.63 kg/m.  General: Cooperative, alert, well developed, in no acute distress. HEENT: Conjunctivae and lids  unremarkable. Cardiovascular: Regular rhythm.  Lungs: Normal work of breathing. Neurologic: No focal deficits.   Lab Results  Component Value Date   CREATININE 0.89 06/12/2020   BUN 17 06/12/2020   NA 141 06/12/2020   K 4.1 06/12/2020   CL 102 06/12/2020   CO2 27 06/12/2020   Lab Results  Component Value Date   ALT 25 06/12/2020   AST 16 06/12/2020   ALKPHOS 95 06/12/2020   BILITOT 0.4 06/12/2020   Lab Results  Component Value Date   HGBA1C 5.8 (H) 06/12/2020   Lab Results  Component Value Date   INSULIN 12.8 06/12/2020   Lab Results  Component Value Date   TSH 1.120 06/12/2020   Lab Results  Component Value Date   CHOL 177 06/12/2020   HDL 55 06/12/2020   LDLCALC 104 (H) 06/12/2020   TRIG 102 06/12/2020   CHOLHDL 3.1 Ratio 05/09/2010   Lab Results  Component Value Date   WBC 7.2 07/01/2018   HGB 14.0 07/01/2018   HCT 40.9 07/01/2018   MCV 91.3 07/01/2018   PLT 406 (H) 07/01/2018   Lab Results  Component Value Date   IRON 93 04/13/2010   TIBC 356 04/13/2010   Obesity Behavioral Intervention Documentation for Insurance:   Approximately 15 minutes were spent on the discussion below.  ASK: We discussed the diagnosis of obesity with [name] today and [name] agreed to give [name] permission to discuss obesity behavioral modification therapy today.  ASSESS: [name] has the diagnosis of obesity and her BMI today is 52.7. [name] is in the action stage of change.   ADVISE: [name] was educated on the multiple health risks of obesity as well as the benefit of weight loss to improve her health. She was advised of the need for long term treatment and the importance of lifestyle modifications to improve her current health and to decrease her risk of future health problems.  AGREE: Multiple dietary modification options and treatment options were discussed and [name] agreed to follow the recommendations documented in the above note.  ARRANGE: [name] was  educated on the importance of frequent visits to treat obesity as outlined per CMS and USPSTF guidelines and agreed to schedule her next follow up appointment today.  Attestation Statements:   Reviewed by clinician on day of visit: allergies, medications, problem list, medical history, surgical history, family history, social history, and previous encounter notes.  Migdalia Dk, am acting as Location manager for [employer], DO   I have reviewed the above documentation for accuracy and completeness, and I agree with the above. Jearld Lesch, DO

## 2020-07-19 ENCOUNTER — Encounter: Payer: Self-pay | Admitting: Physician Assistant

## 2020-07-19 ENCOUNTER — Ambulatory Visit (INDEPENDENT_AMBULATORY_CARE_PROVIDER_SITE_OTHER): Payer: Medicare Other | Admitting: Physician Assistant

## 2020-07-19 VITALS — Ht 67.0 in | Wt 338.8 lb

## 2020-07-19 DIAGNOSIS — M1711 Unilateral primary osteoarthritis, right knee: Secondary | ICD-10-CM

## 2020-07-19 NOTE — Progress Notes (Signed)
HPI: [name] returns today due to increasing right knee pain over the last 2 weeks she has had no injury.  She states that the injections just are not lasting long enough.  She last had right knee injection 05/23/2020 which again lasted until about 2 weeks ago.  Viscosupplementation was given 02/02/2020.  She is asking about total knee replacement.  She has been to weight loss clinic but states she is having a very difficult time losing weight. States that her right knee is very achy and constant.  Radiates to the thigh and lower leg.  She has had no known injury.  Review of systems see HPI.  Physical exam: Height 5 foot 7 weight 338.8 pounds BMI 53.06 Right knee: No abnormal warmth erythema.  Overall good range of motion.  Tenderness along medial lateral joint line.  No instability valgus varus stressing.  Impression: Right knee osteoarthritis Obesity with BMI of 53.06  Plan we will try to gain approval for supplemental injection have her back once this is available.  Should continue to work on weight loss.  Discussed with her at length increased complications due to her weight.  Recommend she lose weight prior to undergoing any type of arthroplasty.

## 2020-07-20 ENCOUNTER — Telehealth: Payer: Self-pay | Admitting: Radiology

## 2020-07-20 ENCOUNTER — Telehealth: Payer: Self-pay

## 2020-07-20 NOTE — Telephone Encounter (Signed)
Right knee supplemental injection 

## 2020-07-20 NOTE — Telephone Encounter (Signed)
Lvm for pt to call back to schedule appt after 08/01/20

## 2020-07-20 NOTE — Telephone Encounter (Signed)
Approved for Monovisc-Right knee Dr. Margarito Liner and Bill 20% OOP No copay No prior auth required    Needs appointment after 08/01/20 due to 6 month rule

## 2020-07-20 NOTE — Telephone Encounter (Signed)
Submitted for VOB for Monovisc-Right knee

## 2020-07-21 NOTE — Telephone Encounter (Signed)
Left another message for pt to call back to schedule

## 2020-08-01 ENCOUNTER — Ambulatory Visit (INDEPENDENT_AMBULATORY_CARE_PROVIDER_SITE_OTHER): Payer: Medicare Other | Admitting: Bariatrics

## 2020-08-03 ENCOUNTER — Other Ambulatory Visit (INDEPENDENT_AMBULATORY_CARE_PROVIDER_SITE_OTHER): Payer: Self-pay | Admitting: Bariatrics

## 2020-08-07 ENCOUNTER — Ambulatory Visit (INDEPENDENT_AMBULATORY_CARE_PROVIDER_SITE_OTHER): Payer: Medicare Other | Admitting: Physician Assistant

## 2020-08-07 ENCOUNTER — Encounter: Payer: Self-pay | Admitting: Physician Assistant

## 2020-08-07 DIAGNOSIS — M1711 Unilateral primary osteoarthritis, right knee: Secondary | ICD-10-CM

## 2020-08-07 MED ORDER — HYALURONAN 88 MG/4ML IX SOSY
88.0000 mg | PREFILLED_SYRINGE | INTRA_ARTICULAR | Status: AC | PRN
  Administered 2020-08-07: 88 mg via INTRA_ARTICULAR

## 2020-08-07 NOTE — Progress Notes (Signed)
   Procedure Note  Patient: [name]             Date of Birth: August 25, 1961           MRN: [id number]             Visit Date: 08/07/2020 HPI: Mrs. Tep returns today for right knee Monovisc injection.  She states the cortisone injection did help some but did not last.  She is having increased pain in the knee no new injury.  Physical exam: Right knee overall good range of motion.  No abnormal warmth erythema or effusion. Procedures: Visit Diagnoses:  1. Primary osteoarthritis of right knee     Large Joint Inj: R knee on 08/07/2020 10:04 AM Indications: pain Details: 22 G 1.5 in needle, anterolateral approach  Arthrogram: No  Medications: 88 mg Hyaluronan 88 MG/4ML Outcome: tolerated well, no immediate complications Procedure, treatment alternatives, risks and benefits explained, specific risks discussed. Consent was given by the patient. Immediately prior to procedure a time out was called to verify the correct patient, procedure, equipment, support staff and site/side marked as required. Patient was prepped and draped in the usual sterile fashion.    Plan: She will work on quad strengthening exercises as shown.  We will see her back in 8 weeks to see what type of response she had to the injection.  Questions encouraged and answered

## 2020-08-08 ENCOUNTER — Ambulatory Visit (INDEPENDENT_AMBULATORY_CARE_PROVIDER_SITE_OTHER): Payer: Medicare Other | Admitting: Bariatrics

## 2020-08-08 ENCOUNTER — Encounter (INDEPENDENT_AMBULATORY_CARE_PROVIDER_SITE_OTHER): Payer: Self-pay | Admitting: Bariatrics

## 2020-08-08 ENCOUNTER — Other Ambulatory Visit: Payer: Self-pay

## 2020-08-08 VITALS — BP 127/83 | HR 55 | Temp 97.9°F | Ht 67.0 in | Wt 332.0 lb

## 2020-08-08 DIAGNOSIS — F3289 Other specified depressive episodes: Secondary | ICD-10-CM | POA: Diagnosis not present

## 2020-08-08 DIAGNOSIS — E559 Vitamin D deficiency, unspecified: Secondary | ICD-10-CM

## 2020-08-08 DIAGNOSIS — R7303 Prediabetes: Secondary | ICD-10-CM

## 2020-08-08 DIAGNOSIS — Z6841 Body Mass Index (BMI) 40.0 and over, adult: Secondary | ICD-10-CM

## 2020-08-08 MED ORDER — BUPROPION HCL ER (SR) 150 MG PO TB12: 150.0000 mg | ORAL_TABLET | Freq: Every day | ORAL | 0 refills | Status: DC

## 2020-08-09 ENCOUNTER — Encounter (INDEPENDENT_AMBULATORY_CARE_PROVIDER_SITE_OTHER): Payer: Self-pay | Admitting: Bariatrics

## 2020-08-09 NOTE — Progress Notes (Signed)
Chief Complaint:   OBESITY [name] is here to discuss her progress with her obesity treatment plan along with follow-up of her obesity related diagnoses. [name] is on the Category 1 Plan + 100 calories and states she is following her eating plan approximately 90% of the time. [name] states she is exercising 0 minutes 0 times per week.  Today's visit was #: 4 Starting weight: 339 lbs Starting date: 06/12/2020 Today's weight: 332 lbs Today's date: 08/08/2020 Total lbs lost to date: 7 Total lbs lost since last in-office visit: 4  Interim History: [name] is down 4 lbs since her last visit. She reports drinking more water. Her PCP is prescribing phentermine.  Subjective:   Prediabetes. [name] has a diagnosis of prediabetes based on her elevated HgA1c and was informed this puts her at greater risk of developing diabetes. She continues to work on diet and exercise to decrease her risk of diabetes. She denies nausea or hypoglycemia. [name] is on no medications.  Lab Results  Component Value Date   HGBA1C 5.8 (H) 06/12/2020   Lab Results  Component Value Date   INSULIN 12.8 06/12/2020   Vitamin D deficiency. [name] is taking Vitamin D supplementation.    Ref. Range 06/12/2020 14:43  Vitamin D, 25-Hydroxy Latest Ref Range: 30.0 - 100.0 ng/mL 31.7   Other depression, with emotional eating. [name] is struggling with emotional eating and using food for comfort to the extent that it is negatively impacting her health. She has been working on behavior modification techniques to help reduce her emotional eating and has been somewhat successful. She shows no sign of suicidal or homicidal ideations. [name] was taking Topamax, but stopped due to sleepiness. She reports cravings at nights.  Assessment/Plan:   Prediabetes. [name] will continue to work on weight loss, exercise, increasing healthy fats and protein, and decreasing simple carbohydrates to help decrease the risk of  diabetes.   Vitamin D deficiency. Low Vitamin D level contributes to fatigue and are associated with obesity, breast, and colon cancer. She agrees to continue to take Vitamin D as directed and will follow-up for routine testing of Vitamin D, at least 2-3 times per year to avoid over-replacement.  Other depression, with emotional eating. Behavior modification techniques were discussed today to help [facility] deal with her emotional/non-hunger eating behaviors.  Orders and follow up as documented in patient record. Prescription was given for buPROPion (WELLBUTRIN SR) 150 MG 12 hr tablet 1 PO daily #30 with 0 refills. She will stop Topamax.  Class 3 severe obesity with serious comorbidity and body mass index (BMI) of 50.0 to 59.9 in adult, unspecified obesity type ([place]).  [name] is currently in the action stage of change. As such, her goal is to continue with weight loss efforts. She has agreed to the Category 1 Plan + 100 calories and will journal 1100 calories and 75+ grams of protein.   Exercise goals: All adults should avoid inactivity. Some physical activity is better than none, and adults who participate in any amount of physical activity gain some health benefits.  Behavioral modification strategies: increasing lean protein intake, decreasing simple carbohydrates, increasing vegetables, increasing water intake, decreasing eating out, no skipping meals, meal planning and cooking strategies, keeping healthy foods in the home and planning for success.  [name] has agreed to follow-up with our clinic in 2-3 weeks. She was informed of the importance of frequent follow-up visits to maximize her success with intensive lifestyle modifications for her multiple health conditions.  Objective:   Blood pressure 127/83, pulse (!) 55, temperature 97.9 F (36.6 C), temperature source Oral, height 5\' 7"  (1.702 m), weight (!) 332 lb (150.6 kg), last menstrual period 04/20/2011, SpO2 97 %. Body mass index is 52  kg/m.  General: Cooperative, alert, well developed, in no acute distress. HEENT: Conjunctivae and lids unremarkable. Cardiovascular: Regular rhythm.  Lungs: Normal work of breathing. Neurologic: No focal deficits.   Lab Results  Component Value Date   CREATININE 0.89 06/12/2020   BUN 17 06/12/2020   NA 141 06/12/2020   K 4.1 06/12/2020   CL 102 06/12/2020   CO2 27 06/12/2020   Lab Results  Component Value Date   ALT 25 06/12/2020   AST 16 06/12/2020   ALKPHOS 95 06/12/2020   BILITOT 0.4 06/12/2020   Lab Results  Component Value Date   HGBA1C 5.8 (H) 06/12/2020   Lab Results  Component Value Date   INSULIN 12.8 06/12/2020   Lab Results  Component Value Date   TSH 1.120 06/12/2020   Lab Results  Component Value Date   CHOL 177 06/12/2020   HDL 55 06/12/2020   LDLCALC 104 (H) 06/12/2020   TRIG 102 06/12/2020   CHOLHDL 3.1 Ratio 05/09/2010   Lab Results  Component Value Date   WBC 7.2 07/01/2018   HGB 14.0 07/01/2018   HCT 40.9 07/01/2018   MCV 91.3 07/01/2018   PLT 406 (H) 07/01/2018   Lab Results  Component Value Date   IRON 93 04/13/2010   TIBC 356 04/13/2010   Attestation Statements:   Reviewed by clinician on day of visit: allergies, medications, problem list, medical history, surgical history, family history, social history, and previous encounter notes.  [name], am acting as Location manager for [employer], DO   I have reviewed the above documentation for accuracy and completeness, and I agree with the above. Danielle Lesch, DO

## 2020-08-23 ENCOUNTER — Other Ambulatory Visit (INDEPENDENT_AMBULATORY_CARE_PROVIDER_SITE_OTHER): Payer: Self-pay | Admitting: Bariatrics

## 2020-08-29 ENCOUNTER — Telehealth (INDEPENDENT_AMBULATORY_CARE_PROVIDER_SITE_OTHER): Payer: Medicare Other | Admitting: Physician Assistant

## 2020-08-29 ENCOUNTER — Telehealth (INDEPENDENT_AMBULATORY_CARE_PROVIDER_SITE_OTHER): Payer: Self-pay

## 2020-08-29 ENCOUNTER — Other Ambulatory Visit: Payer: Self-pay

## 2020-08-29 ENCOUNTER — Ambulatory Visit (INDEPENDENT_AMBULATORY_CARE_PROVIDER_SITE_OTHER): Payer: Medicare Other | Admitting: Bariatrics

## 2020-08-29 NOTE — Telephone Encounter (Signed)
Verbal consent obtained

## 2020-08-30 ENCOUNTER — Other Ambulatory Visit: Payer: Self-pay | Admitting: Orthopaedic Surgery

## 2020-08-30 ENCOUNTER — Telehealth: Payer: Self-pay | Admitting: Orthopaedic Surgery

## 2020-08-30 MED ORDER — TRAMADOL HCL 50 MG PO TABS: 100.0000 mg | ORAL_TABLET | Freq: Four times a day (QID) | ORAL | 0 refills | Status: DC | PRN

## 2020-08-30 NOTE — Telephone Encounter (Signed)
Patient called.   She made an appointment for Monday but is requesting a call to see if there is anything to can do to manage the pain in the meantime.   Call back: [phone]

## 2020-08-30 NOTE — Telephone Encounter (Signed)
Patient states that she can't accept tramadol because she is in a pain clinic, I told her to use Ibuprofen,ice,heat until next week and we could give her a cortisone injection I called [facility] to cancel the Tramadol

## 2020-08-30 NOTE — Telephone Encounter (Signed)
Please advise 

## 2020-08-30 NOTE — Telephone Encounter (Signed)
I sent in some tramadol to try.

## 2020-08-31 ENCOUNTER — Other Ambulatory Visit (INDEPENDENT_AMBULATORY_CARE_PROVIDER_SITE_OTHER): Payer: Self-pay | Admitting: Bariatrics

## 2020-08-31 DIAGNOSIS — F3289 Other specified depressive episodes: Secondary | ICD-10-CM

## 2020-09-03 ENCOUNTER — Encounter (HOSPITAL_COMMUNITY): Payer: Self-pay | Admitting: Emergency Medicine

## 2020-09-03 ENCOUNTER — Emergency Department (HOSPITAL_COMMUNITY)
Admission: EM | Admit: 2020-09-03 | Discharge: 2020-09-03 | Disposition: A | Discharge status: Home / Self Care | Payer: Medicare Other | Attending: Emergency Medicine | Admitting: Emergency Medicine

## 2020-09-03 ENCOUNTER — Other Ambulatory Visit: Payer: Self-pay

## 2020-09-03 ENCOUNTER — Emergency Department (HOSPITAL_COMMUNITY): Payer: Medicare Other

## 2020-09-03 DIAGNOSIS — I10 Essential (primary) hypertension: Secondary | ICD-10-CM | POA: Diagnosis not present

## 2020-09-03 DIAGNOSIS — Z7951 Long term (current) use of inhaled steroids: Secondary | ICD-10-CM | POA: Diagnosis not present

## 2020-09-03 DIAGNOSIS — Z23 Encounter for immunization: Secondary | ICD-10-CM | POA: Diagnosis not present

## 2020-09-03 DIAGNOSIS — J45909 Unspecified asthma, uncomplicated: Secondary | ICD-10-CM | POA: Diagnosis not present

## 2020-09-03 DIAGNOSIS — R05 Cough: Secondary | ICD-10-CM | POA: Diagnosis present

## 2020-09-03 DIAGNOSIS — J208 Acute bronchitis due to other specified organisms: Secondary | ICD-10-CM

## 2020-09-03 DIAGNOSIS — U071 COVID-19: Secondary | ICD-10-CM | POA: Diagnosis not present

## 2020-09-03 DIAGNOSIS — Z79899 Other long term (current) drug therapy: Secondary | ICD-10-CM | POA: Insufficient documentation

## 2020-09-03 MED ORDER — METHYLPREDNISOLONE SODIUM SUCC 125 MG IJ SOLR: 125.0000 mg | Freq: Once | INTRAMUSCULAR | Status: DC | PRN

## 2020-09-03 MED ORDER — FAMOTIDINE IN NACL 20-0.9 MG/50ML-% IV SOLN: 20.0000 mg | Freq: Once | INTRAVENOUS | Status: DC | PRN

## 2020-09-03 MED ORDER — DIPHENHYDRAMINE HCL 50 MG/ML IJ SOLN: 50.0000 mg | Freq: Once | INTRAMUSCULAR | Status: DC | PRN

## 2020-09-03 MED ORDER — ALBUTEROL SULFATE HFA 108 (90 BASE) MCG/ACT IN AERS
8.0000 | INHALATION_SPRAY | Freq: Once | RESPIRATORY_TRACT | Status: AC
  Administered 2020-09-03: 8 via RESPIRATORY_TRACT
  Filled 2020-09-03: qty 6.7

## 2020-09-03 MED ORDER — ALBUTEROL SULFATE HFA 108 (90 BASE) MCG/ACT IN AERS: 2.0000 | INHALATION_SPRAY | Freq: Once | RESPIRATORY_TRACT | Status: DC | PRN

## 2020-09-03 MED ORDER — SODIUM CHLORIDE 0.9 % IV SOLN
INTRAVENOUS | Status: DC | PRN
  Administered 2020-09-03: 500 mL via INTRAVENOUS

## 2020-09-03 MED ORDER — EPINEPHRINE 0.3 MG/0.3ML IJ SOAJ: 0.3000 mg | Freq: Once | INTRAMUSCULAR | Status: DC | PRN

## 2020-09-03 MED ORDER — AEROCHAMBER PLUS FLO-VU MISC
1.0000 | Freq: Once | Status: AC
  Administered 2020-09-03: 1
  Filled 2020-09-03: qty 1

## 2020-09-03 MED ORDER — SODIUM CHLORIDE 0.9 % IV SOLN
1200.0000 mg | Freq: Once | INTRAVENOUS | Status: AC
  Administered 2020-09-03: 1200 mg via INTRAVENOUS
  Filled 2020-09-03: qty 10

## 2020-09-03 NOTE — ED Notes (Signed)
This RN asked pt if she normally had high blood pressure due to her pressures being 198/141. She stated she did and usually takes medication for it but did not take her medication today.

## 2020-09-03 NOTE — Discharge Instructions (Addendum)
You were seen in the ER for shortness of breath, chest tightness and cough  Chest x-ray was normal.  EKG did not show any abnormalities.  I suspect you have bronchitis from Covid.  Please use your nebulizing treatments every 4 hours, use your albuterol inhaler as needed for breakthrough shortness of breath chest tightness or wheezing.  You can take over-the-counter Mucinex or other cough to help get rid of mucus.  Continue all your daily medicines.  Please call Covid clinic for reevaluation in the next 2 to 3 days.  Return for worsening symptoms.

## 2020-09-03 NOTE — ED Notes (Signed)
Electronic consent obtained and witnessed by this RN for mAb infusion.

## 2020-09-03 NOTE — ED Triage Notes (Addendum)
Patient with complaints of chest congestion and cough. Patient has been diagnosed COVID+ by her doctor. Patient was prescribed steroids. Patient states she was tested Tuesday and results came in Thursday. Patient denies shortness of breath.

## 2020-09-03 NOTE — ED Provider Notes (Signed)
[facility] DEPT Provider Note   CSN: [id number] Arrival date & time: 09/03/20  0455     History Chief Complaint  Patient presents with  . COVID+  . Cough    [name] is a 59 y.o. female with history of elevated BMI, asthma, prediabetes, hypertension presents to the ER for evaluation of cough, chest tightness and shortness of breath onset around 9/12.  Patient was diagnosed with COVID-19 on 9/14.  Her son tested positive for Covid 17 days ago and they live together.  Her primary care doctor has prescribed her prednisone.  She was told that she should not use her breathing treatments if she had COVID-19 so she has not used them.  She was also told that she should not take Mucinex.  She feels like there is phlegm in her chest that she cannot cough up.  Her shortness of breath is constant and worse with exertion and better with rest.  Denies any fevers, congestion, sore throat, vomiting, diarrhea or abdominal pain.  No frank chest pain. No pleuritic chest pain.  Normal sense of taste but states she thinks she may not be able to smell as well.  Denies of DVT, PE.  No recent surgery, prolonged immobilization, hormone therapy, active cancer treatment, hemoptysis.  No leg swelling or calf pain.  HPI     Past Medical History:  Diagnosis Date  . Anxiety   . Asthma   . Back pain   . Chronic pain   . DDD (degenerative disc disease), lumbar   . Degenerative disc disease   . Degenerative disc disease, cervical   . Degenerative disc disease, cervical   . Fibromyalgia   . Headache    regular  . Heartburn   . Hypertension   . Joint pain   . Localized swelling of both lower legs   . Osteoarthritis   . Other specified disorders of thyroid   . Rheumatoid arthritis ([place])   . Sleep apnea    mild, patient does not use mask  . SOB (shortness of breath)     Patient Active Problem List   Diagnosis Date Noted  . Morbid obesity ([name]) 06/13/2020  .  Prediabetes 06/13/2020  . Leg pain 06/13/2020  . Unilateral primary osteoarthritis, right knee 02/02/2020  . Status post total left knee replacement 01/17/2017  . Unilateral primary osteoarthritis, left knee 12/05/2016  . Degenerative disc disease, cervical     Past Surgical History:  Procedure Laterality Date  . BACK SURGERY    . NOVASURE ABLATION    . TOTAL KNEE ARTHROPLASTY Left 01/17/2017   Procedure: LEFT TOTAL KNEE ARTHROPLASTY;  Surgeon: Mcarthur Rossetti, MD;  Location: [facility];  Service: Orthopedics;  Laterality: Left;  . TUBAL LIGATION       OB History    Gravida  2   Para  2   Term      Preterm      AB      Living        SAB      TAB      Ectopic      Multiple      Live Births              Family History  Problem Relation Age of Onset  . Cancer Mother   . Hypertension Mother   . Eating disorder Mother   . Obesity Mother   . Diabetes Father   . Heart disease Father   .  High Cholesterol Father   . Kidney disease Father   . Alcoholism Father   . Eating disorder Father   . Cancer Sister     Social History   Tobacco Use  . Smoking status: Never Smoker  . Smokeless tobacco: Never Used  Vaping Use  . Vaping Use: Never used  Substance Use Topics  . Alcohol use: Not Currently  . Drug use: No    Home Medications Prior to Admission medications   Medication Sig Start Date End Date Taking? Authorizing Provider  albuterol (PROVENTIL HFA;VENTOLIN HFA) 108 (90 BASE) MCG/ACT inhaler Inhale 2 puffs into the lungs every 6 (six) hours as needed for shortness of breath.     [provider]  albuterol (PROVENTIL) (2.5 MG/3ML) 0.083% nebulizer solution Take 2.5 mg by nebulization every 6 (six) hours as needed for wheezing or shortness of breath.    [provider]  atenolol (TENORMIN) 100 MG tablet Take 100 mg by mouth daily.     [provider]  buPROPion (WELLBUTRIN SR) 150 MG 12 hr tablet Take 1 tablet (150 mg total)  by mouth daily. 08/08/20   Jearld Lesch A, DO  cetirizine (ZYRTEC) 10 MG tablet Take 10 mg by mouth daily as needed for allergies.    [provider]  cyclobenzaprine (FLEXERIL) 10 MG tablet Take 10 mg by mouth 3 (three) times daily as needed for muscle spasms.    [provider]  FLOVENT HFA 220 MCG/ACT inhaler USE 2 PUFFS TWICE A DAY 03/10/17   [provider]  folic acid (FOLVITE) 1 MG tablet Take 1 mg by mouth daily.    [provider]  furosemide (LASIX) 20 MG tablet Take 20 mg by mouth daily as needed for fluid or edema.    [provider]  ibuprofen (ADVIL,MOTRIN) 800 MG tablet Take 800 mg by mouth 3 (three) times daily as needed for moderate pain.  08/28/15   [provider]  losartan-hydrochlorothiazide (HYZAAR) 100-25 MG per tablet Take 1 tablet by mouth daily.    [provider]  Oxycodone HCl 20 MG TABS TAKE 1 TABLET BY MOUTH EVERY 4-6 HOURS (MAX OF 5 PER DAY) 03/10/17   [provider]  phentermine 37.5 MG capsule Take 37.5 mg by mouth every morning.    [provider]  traMADol (ULTRAM) 50 MG tablet Take 2 tablets (100 mg total) by mouth every 6 (six) hours as needed. 08/30/20   Mcarthur Rossetti, MD  Vitamin D, Ergocalciferol, (DRISDOL) 1.25 MG (50000 UNIT) CAPS capsule Take 1 capsule (50,000 Units total) by mouth every 7 (seven) days. 06/26/20   Georgia Lopes, DO    Allergies    Patient has no known allergies.  Review of Systems   Review of Systems  Respiratory: Positive for cough, chest tightness and shortness of breath.   All other systems reviewed and are negative.   Physical Exam Updated Vital Signs BP (!) 117/51   Pulse 83   Temp 99.7 F (37.6 C) (Oral)   Resp 17   Ht 5\' 7"  (1.702 m)   Wt (!) 151.5 kg   LMP 04/20/2011   SpO2 100%   BMI 52.31 kg/m   Physical Exam Vitals and nursing note reviewed.  Constitutional:      General: She is not in acute distress.    Appearance: She  is well-developed.     Comments: NAD.  HENT:     Head: Normocephalic and atraumatic.     Right  Ear: External ear normal.     Left Ear: External ear normal.     Nose: Nose normal.  Eyes:     General: No scleral icterus.    Conjunctiva/sclera: Conjunctivae normal.  Cardiovascular:     Rate and Rhythm: Normal rate and regular rhythm.     Heart sounds: Normal heart sounds.     Comments: No lower extremity edema.  No calf tenderness. Pulmonary:     Effort: Pulmonary effort is normal.     Breath sounds: Normal breath sounds.     Comments: Normal work of breathing.  Speaking in full sentences.  No wheezing.  Diminished air sounds in lower lobes bilaterally, difficult exam due to body habitus.  No crackles.  Patient has a dry cough during exam.  SPO2 greater than 95% during our encounter. Abdominal:     Palpations: Abdomen is soft.     Tenderness: There is no abdominal tenderness.  Musculoskeletal:        General: No deformity. Normal range of motion.     Cervical back: Normal range of motion and neck supple.  Skin:    General: Skin is warm and dry.     Capillary Refill: Capillary refill takes less than 2 seconds.  Neurological:     Mental Status: She is alert and oriented to person, place, and time.  Psychiatric:        Behavior: Behavior normal.        Thought Content: Thought content normal.        Judgment: Judgment normal.     ED Results / Procedures / Treatments   Labs (all labs ordered are listed, but only abnormal results are displayed) Labs Reviewed - No data to display  EKG EKG Interpretation  Date/Time:  Sunday September 03 2020 08:17:48 EDT Ventricular Rate:  57 PR Interval:    QRS Duration: 89 QT Interval:  405 QTC Calculation: 395 R Axis:   49 Text Interpretation: Normal sinus rhythm Normal ECG Confirmed by Pattricia Boss [phone]) on 09/03/2020 8:44:50 AM   Radiology DG Chest Portable 1 View  Result Date: 09/03/2020 CLINICAL DATA:  COVID positive. Shortness  of breath, cough and congestion EXAM: PORTABLE CHEST 1 VIEW COMPARISON:  03/12/2019 FINDINGS: The heart size and mediastinal contours are within normal limits. Lungs are suboptimally inflated. Both lungs are clear. The visualized skeletal structures are unremarkable. IMPRESSION: No active cardiopulmonary abnormalities. Electronically Signed   By: Kerby Moors M.D.   On: 09/03/2020 06:48    Procedures Procedures (including critical care time)  Medications Ordered in ED Medications  casirivimab-imdevimab (REGEN-COV) 1,200 mg in sodium chloride 0.9 % 110 mL IVPB (1,200 mg Intravenous New Bag/Given 09/03/20 1128)  0.9 %  sodium chloride infusion (500 mLs Intravenous New Bag/Given 09/03/20 1126)  diphenhydrAMINE (BENADRYL) injection 50 mg (has no administration in time range)  famotidine (PEPCID) IVPB 20 mg premix (has no administration in time range)  methylPREDNISolone sodium succinate (SOLU-MEDROL) 125 mg/2 mL injection 125 mg (has no administration in time range)  albuterol (VENTOLIN HFA) 108 (90 Base) MCG/ACT inhaler 2 puff (has no administration in time range)  EPINEPHrine (EPI-PEN) injection 0.3 mg (has no administration in time range)  albuterol (VENTOLIN HFA) 108 (90 Base) MCG/ACT inhaler 8 puff (8 puffs Inhalation Given 09/03/20 0944)  aerochamber plus with mask device 1 each (1 each Other Given 09/03/20 0944)    ED Course  I have reviewed the triage vital signs and the nursing notes.  Pertinent labs & imaging results that  were available during my care of the patient were reviewed by me and considered in my medical decision making (see chart for details).    MDM Rules/Calculators/A&P                          59 year old female with history of asthma, hypertension, BMI of 52, prediabetes presents to the ER for shortness of breath since diagnosis of Covid on 9/12.  Symptoms started around that time.  EMR, triage nursing notes reviewed to obtain more history and assist with  MDM.  Patient tells me he has been on prednisone.  Has actually not been using Mucinex or albuterol inhaler.  ER work-up including chest x-ray ordered by triage RN, this was personally visualized and interpreted by me.  Chest x-ray without any active cardiopulmonary findings or pneumonia.    Ordered EKG, this is benign.  Exam is reassuring.  No hypoxia, tachycardia, hypotension.  Lungs slightly diminished at bases bilaterally but no crackles or wheezing.  Patient requesting a breathing treatment here, albuterol 8 puffs given with improvement in symptoms.  Patient ambulated here without hypoxia.  Overall her exam is benign.  No medical indication for further emergent lab work, admission.  Discussed with patient she meets criteria for monoclonal antibody infusion.  Offered her outpatient infusion versus ER infusion, initially she declined infusion and right at discharge changed her mind and requesting infusion here in the ER.  I have ordered monoclonal antibody infusion in the ER.  Patient was observed without complications post infusion.  No clinical decline on reevaluation of the patient.  We will discharge with Mucinex, albuterol inhaler/nebulizer.  She was given Covid clinic contact information to follow-up.  Strict return precautions given.  Final Clinical Impression(s) / ED Diagnoses Final diagnoses:  Acute bronchitis due to COVID-19 virus    Rx / DC Orders ED Discharge Orders    None       Kinnie Feil, PA-C 09/03/20 1143    Pattricia Boss, MD 09/03/20 1300

## 2020-09-04 ENCOUNTER — Ambulatory Visit: Payer: Medicare Other | Admitting: Orthopaedic Surgery

## 2020-09-11 ENCOUNTER — Ambulatory Visit: Payer: Medicare Other

## 2020-09-19 ENCOUNTER — Encounter (INDEPENDENT_AMBULATORY_CARE_PROVIDER_SITE_OTHER): Payer: Self-pay | Admitting: Family Medicine

## 2020-09-19 ENCOUNTER — Ambulatory Visit (INDEPENDENT_AMBULATORY_CARE_PROVIDER_SITE_OTHER): Payer: Medicare Other | Admitting: Family Medicine

## 2020-09-19 ENCOUNTER — Other Ambulatory Visit: Payer: Self-pay

## 2020-09-19 VITALS — BP 127/83 | HR 67 | Temp 98.3°F | Ht 67.0 in | Wt 334.0 lb

## 2020-09-19 DIAGNOSIS — I10 Essential (primary) hypertension: Secondary | ICD-10-CM

## 2020-09-19 DIAGNOSIS — Z6841 Body Mass Index (BMI) 40.0 and over, adult: Secondary | ICD-10-CM | POA: Diagnosis not present

## 2020-09-19 DIAGNOSIS — E8881 Metabolic syndrome: Secondary | ICD-10-CM

## 2020-09-19 MED ORDER — BD PEN NEEDLE NANO 2ND GEN 32G X 4 MM MISC: 1.0000 | Freq: Two times a day (BID) | 0 refills | Status: DC

## 2020-09-19 MED ORDER — LIRAGLUTIDE 18 MG/3ML ~~LOC~~ SOPN: 0.6000 mg | PEN_INJECTOR | Freq: Every day | SUBCUTANEOUS | 0 refills | Status: DC

## 2020-09-20 NOTE — Progress Notes (Signed)
Chief Complaint:   OBESITY [name] is here to discuss her progress with her obesity treatment plan along with follow-up of her obesity related diagnoses. [name] is on the Category 1 Plan + 100 calories or keeping a food journal and adhering to recommended goals of 1100 calories and 75+ grams of protein daily and states she is following her eating plan approximately 30% of the time. [name] states she is doing 0 minutes 0 times per week.  Today's visit was #: 5 Starting weight: 339 lbs Starting date: 06/12/2020 Today's weight: 334 lbs Today's date: 09/19/2020 Total lbs lost to date: 5 Total lbs lost since last in-office visit: 0  Interim History: [name] had COVID and really only had cough. She restarted Category 1 plan yesterday. She voices she is consistently hungry on the Category 1 plan. She voices she really wants to get weight off. She finds she thinks about food frequently.  Subjective:   1. Metabolic syndrome Leighton has a an elevated blood sugar level, and elevated LDL of 104 as well as elevated waist circumference.  2. Essential hypertension Merary's blood pressure is well controlled. She denies chest pain, chest pressure, or headache.  Assessment/Plan:   1. Metabolic syndrome [name] agreed to start Victoza 0.6 mg SubQ daily #2 pens with no refills, and pen needles #100 with no refills.  - liraglutide (VICTOZA) 18 MG/3ML SOPN; Inject 0.6 mg into the skin daily.  Dispense: 6 mL; Refill: 0 - Insulin Pen Needle (BD PEN NEEDLE NANO 2ND GEN) 32G X 4 MM MISC; 1 Package by Does not apply route 2 (two) times daily.  Dispense: 100 each; Refill: 0  2. Essential hypertension Nalda will continue her current medications, no refill needed. She will continue working on healthy weight loss and exercise to improve blood pressure control. We will watch for signs of hypotension as she continues her lifestyle modifications.  3. Class 3 severe obesity with serious comorbidity and  body mass index (BMI) of 50.0 to 59.9 in adult, unspecified obesity type (HCC) [name] is currently in the action stage of change. As such, her goal is to continue with weight loss efforts. She has agreed to the Category 2 Plan.   Exercise goals: No exercise has been prescribed at this time.  Behavioral modification strategies: increasing lean protein intake, meal planning and cooking strategies, keeping healthy foods in the home and planning for success.  [name] has agreed to follow-up with our clinic in 2 weeks. She was informed of the importance of frequent follow-up visits to maximize her success with intensive lifestyle modifications for her multiple health conditions.   Objective:   Blood pressure 127/83, pulse 67, temperature 98.3 F (36.8 C), height 5\' 7"  (1.702 m), weight (!) 334 lb (151.5 kg), last menstrual period 04/20/2011, SpO2 99 %. Body mass index is 52.31 kg/m.  General: Cooperative, alert, well developed, in no acute distress. HEENT: Conjunctivae and lids unremarkable. Cardiovascular: Regular rhythm.  Lungs: Normal work of breathing. Neurologic: No focal deficits.   Lab Results  Component Value Date   CREATININE 0.89 06/12/2020   BUN 17 06/12/2020   NA 141 06/12/2020   K 4.1 06/12/2020   CL 102 06/12/2020   CO2 27 06/12/2020   Lab Results  Component Value Date   ALT 25 06/12/2020   AST 16 06/12/2020   ALKPHOS 95 06/12/2020   BILITOT 0.4 06/12/2020   Lab Results  Component Value Date   HGBA1C 5.8 (H) 06/12/2020   Lab Results  Component  Value Date   INSULIN 12.8 06/12/2020   Lab Results  Component Value Date   TSH 1.120 06/12/2020   Lab Results  Component Value Date   CHOL 177 06/12/2020   HDL 55 06/12/2020   LDLCALC 104 (H) 06/12/2020   TRIG 102 06/12/2020   CHOLHDL 3.1 Ratio 05/09/2010   Lab Results  Component Value Date   WBC 7.2 07/01/2018   HGB 14.0 07/01/2018   HCT 40.9 07/01/2018   MCV 91.3 07/01/2018   PLT 406 (H) 07/01/2018    Lab Results  Component Value Date   IRON 93 04/13/2010   TIBC 356 04/13/2010    Obesity Behavioral Intervention:   Approximately 15 minutes were spent on the discussion below.  ASK: We discussed the diagnosis of obesity with [name] today and [name] agreed to give [name] permission to discuss obesity behavioral modification therapy today.  ASSESS: [name] has the diagnosis of obesity and her BMI today is 52.3. [name] is in the action stage of change.   ADVISE: [name] was educated on the multiple health risks of obesity as well as the benefit of weight loss to improve her health. She was advised of the need for long term treatment and the importance of lifestyle modifications to improve her current health and to decrease her risk of future health problems.  AGREE: Multiple dietary modification options and treatment options were discussed and [name] agreed to follow the recommendations documented in the above note.  ARRANGE: [name] was educated on the importance of frequent visits to treat obesity as outlined per CMS and USPSTF guidelines and agreed to schedule her next follow up appointment today.  Attestation Statements:   Reviewed by clinician on day of visit: allergies, medications, problem list, medical history, surgical history, family history, social history, and previous encounter notes.   I, Trixie Dredge, am acting as transcriptionist for Danielle Common, MD.  I have reviewed the above documentation for accuracy and completeness, and I agree with the above. - Jinny Blossom, MD

## 2020-09-25 ENCOUNTER — Ambulatory Visit: Payer: Medicare Other | Admitting: Physician Assistant

## 2020-10-11 ENCOUNTER — Other Ambulatory Visit: Payer: Self-pay

## 2020-10-11 ENCOUNTER — Telehealth (INDEPENDENT_AMBULATORY_CARE_PROVIDER_SITE_OTHER): Payer: Self-pay

## 2020-10-11 ENCOUNTER — Encounter (INDEPENDENT_AMBULATORY_CARE_PROVIDER_SITE_OTHER): Payer: Self-pay | Admitting: Family Medicine

## 2020-10-11 ENCOUNTER — Telehealth (INDEPENDENT_AMBULATORY_CARE_PROVIDER_SITE_OTHER): Payer: Medicare Other | Admitting: Family Medicine

## 2020-10-11 DIAGNOSIS — Z6841 Body Mass Index (BMI) 40.0 and over, adult: Secondary | ICD-10-CM

## 2020-10-11 DIAGNOSIS — F3289 Other specified depressive episodes: Secondary | ICD-10-CM | POA: Diagnosis not present

## 2020-10-11 DIAGNOSIS — E8881 Metabolic syndrome: Secondary | ICD-10-CM | POA: Diagnosis not present

## 2020-10-11 DIAGNOSIS — E559 Vitamin D deficiency, unspecified: Secondary | ICD-10-CM | POA: Diagnosis not present

## 2020-10-11 MED ORDER — LIRAGLUTIDE 18 MG/3ML ~~LOC~~ SOPN: 0.9000 mg | PEN_INJECTOR | Freq: Every day | SUBCUTANEOUS | 0 refills | Status: DC

## 2020-10-11 MED ORDER — VITAMIN D (ERGOCALCIFEROL) 1.25 MG (50000 UNIT) PO CAPS: 50000.0000 [IU] | ORAL_CAPSULE | ORAL | 0 refills | Status: DC

## 2020-10-11 NOTE — Telephone Encounter (Signed)
I connected with  [name] on 10/11/20 by a video enabled telemedicine application and verified that I am speaking with the correct person using two identifiers.   I discussed the limitations of evaluation and management by telemedicine. The patient expressed understanding and agreed to proceed.

## 2020-10-12 ENCOUNTER — Encounter (INDEPENDENT_AMBULATORY_CARE_PROVIDER_SITE_OTHER): Payer: Self-pay | Admitting: Family Medicine

## 2020-10-16 NOTE — Progress Notes (Signed)
TeleHealth Visit:  Due to the COVID-19 pandemic, this visit was completed with telemedicine (audio/video) technology to reduce patient and provider exposure as well as to preserve personal protective equipment.   [name] has verbally consented to this TeleHealth visit. The patient is located at home, the provider is located at the [facility] office. The participants in this visit include the listed provider and patient. The visit was conducted today via MyChart video.   Chief Complaint: OBESITY [name] is here to discuss her progress with her obesity treatment plan along with follow-up of her obesity related diagnoses. [name] is on the Category 2 Plan and states she is following her eating plan approximately 40% of the time. [name] states she is exercising with YouTube videos for 30 minutes 3 times per week.  Today's visit was #: 6 Starting weight: 339 lbs Starting date: 06/12/2020  Interim History: [name] is often eating breakfast at breakfast time. She will eat breakfast later in the morning and then will eat later in the day. Her second meal she is eating baked chicken or salmon. She is craving chocolate and almonds. She wants to get back on track.  Subjective:   1. Metabolic syndrome [name] started Victoza 0.6 mg SubQ daily. She denies appetite during the day but hunger in the evening.  2. Vitamin D deficiency [name] denies nausea, vomiting, or muscle weakness, but she notes fatigue. Last Vit D level was 31.7.  3. Other depression, with emotional eating [name] is not taking Wellbutrin, and she denies suicidal or homicidal ideas.  Assessment/Plan:   1. Metabolic syndrome [name] agreed to increase Victoza to 0.9 mg SubQ daily with no refills.  - liraglutide (VICTOZA) 18 MG/3ML SOPN; Inject 0.9 mg into the skin daily.  Dispense: 6 mL; Refill: 0  2. Vitamin D deficiency Low Vitamin D level contributes to fatigue and are associated with obesity, breast,  and colon cancer. We will refill prescription Vitamin D for 1 month. [name] will follow-up for routine testing of Vitamin D, at least 2-3 times per year to avoid over-replacement.  - Vitamin D, Ergocalciferol, (DRISDOL) 1.25 MG (50000 UNIT) CAPS capsule; Take 1 capsule (50,000 Units total) by mouth every 7 (seven) days.  Dispense: 4 capsule; Refill: 0  3. Other depression, with emotional eating Behavior modification techniques were discussed today to help [name] deal with her emotional/non-hunger eating behaviors. [name] agreed to discontinue Wellbutrin. Orders and follow up as documented in patient record.   4. Class 3 severe obesity with serious comorbidity and body mass index (BMI) of 50.0 to 59.9 in adult, unspecified obesity type (HCC) [name] is currently in the action stage of change. As such, her goal is to continue with weight loss efforts. She has agreed to the Category 2 Plan.   Exercise goals: No exercise has been prescribed at this time.  Behavioral modification strategies: increasing lean protein intake, no skipping meals and better snacking choices.  [name] has agreed to follow-up with our clinic in 2 weeks. She was informed of the importance of frequent follow-up visits to maximize her success with intensive lifestyle modifications for her multiple health conditions.  Objective:   VITALS: Per patient if applicable, see vitals. GENERAL: Alert and in no acute distress. CARDIOPULMONARY: No increased WOB. Speaking in clear sentences.  PSYCH: Pleasant and cooperative. Speech normal rate and rhythm. Affect is appropriate. Insight and judgement are appropriate. Attention is focused, linear, and appropriate.  NEURO: Oriented as arrived to appointment on time with no prompting.  Lab Results  Component Value Date   CREATININE 0.89 06/12/2020   BUN 17 06/12/2020   NA 141 06/12/2020   K 4.1 06/12/2020   CL 102 06/12/2020   CO2 27 06/12/2020   Lab Results  Component Value  Date   ALT 25 06/12/2020   AST 16 06/12/2020   ALKPHOS 95 06/12/2020   BILITOT 0.4 06/12/2020   Lab Results  Component Value Date   HGBA1C 5.8 (H) 06/12/2020   Lab Results  Component Value Date   INSULIN 12.8 06/12/2020   Lab Results  Component Value Date   TSH 1.120 06/12/2020   Lab Results  Component Value Date   CHOL 177 06/12/2020   HDL 55 06/12/2020   LDLCALC 104 (H) 06/12/2020   TRIG 102 06/12/2020   CHOLHDL 3.1 Ratio 05/09/2010   Lab Results  Component Value Date   WBC 7.2 07/01/2018   HGB 14.0 07/01/2018   HCT 40.9 07/01/2018   MCV 91.3 07/01/2018   PLT 406 (H) 07/01/2018   Lab Results  Component Value Date   IRON 93 04/13/2010   TIBC 356 04/13/2010    Attestation Statements:   Reviewed by clinician on day of visit: allergies, medications, problem list, medical history, surgical history, family history, social history, and previous encounter notes.  Time spent on visit including pre-visit chart review and post-visit charting and care was 25 minutes.    I, Trixie Dredge, am acting as transcriptionist for [name], MD.  I have reviewed the above documentation for accuracy and completeness, and I agree with the above. - Jinny Blossom, MD

## 2020-10-19 ENCOUNTER — Ambulatory Visit: Payer: Medicare Other | Admitting: Physician Assistant

## 2020-10-23 ENCOUNTER — Encounter: Payer: Self-pay | Admitting: Physician Assistant

## 2020-10-23 ENCOUNTER — Ambulatory Visit (INDEPENDENT_AMBULATORY_CARE_PROVIDER_SITE_OTHER): Payer: Medicare Other | Admitting: Physician Assistant

## 2020-10-23 DIAGNOSIS — M1711 Unilateral primary osteoarthritis, right knee: Secondary | ICD-10-CM | POA: Diagnosis not present

## 2020-10-23 MED ORDER — METHYLPREDNISOLONE ACETATE 40 MG/ML IJ SUSP
40.0000 mg | INTRAMUSCULAR | Status: AC | PRN
  Administered 2020-10-23: 40 mg via INTRA_ARTICULAR

## 2020-10-23 MED ORDER — LIDOCAINE HCL 1 % IJ SOLN
3.0000 mL | INTRAMUSCULAR | Status: AC | PRN
  Administered 2020-10-23: 3 mL

## 2020-10-23 NOTE — Progress Notes (Signed)
   Procedure Note  Patient: [name]             Date of Birth: 1961-01-22           MRN: [id number]             Visit Date: 10/23/2020 HPI: [name] returns today due to her right knee osteoarthritis.  She states that the supplemental injection did not help.  She is wanting a cortisone injection in the knee.  She reports that her diabetes is under good control.  She has lost weight going to the weight loss clinic but does not want to discuss surgery at all at this point time.  Physical exam: Right knee good range of motion.  No abnormal warmth erythema or effusion. Procedures: Visit Diagnoses:  1. Primary osteoarthritis of right knee     Large Joint Inj on 10/23/2020 8:57 AM Indications: pain Details: 22 G 1.5 in needle, anterolateral approach  Arthrogram: No  Medications: 3 mL lidocaine 1 %; 40 mg methylPREDNISolone acetate 40 MG/ML Outcome: tolerated well, no immediate complications Procedure, treatment alternatives, risks and benefits explained, specific risks discussed. Consent was given by the patient. Immediately prior to procedure a time out was called to verify the correct patient, procedure, equipment, support staff and site/side marked as required. Patient was prepped and draped in the usual sterile fashion.     Impression: Right knee osteoarthritis discussed with her that she needs to wait at least 3 months between injections.  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  She did also ask about her foot which is turning outward.  On exam she is weak with inversion noted posterior tibial tendon seems intact.  She is has weakness with coming up on the toes of the right leg compared to being able to perform a single heel raise on the right easily.  Bilateral pes planus.  Discussed with her shoewear and also posterior tibial tendon strengthening exercises.  She will follow up with [name] as needed in regards to her foot and knee.

## 2020-10-26 ENCOUNTER — Ambulatory Visit (INDEPENDENT_AMBULATORY_CARE_PROVIDER_SITE_OTHER): Payer: Medicare Other | Admitting: Family Medicine

## 2020-12-27 ENCOUNTER — Ambulatory Visit: Payer: Medicare Other | Admitting: Physician Assistant

## 2021-01-01 ENCOUNTER — Telehealth (INDEPENDENT_AMBULATORY_CARE_PROVIDER_SITE_OTHER): Payer: Medicare Other | Admitting: Physician Assistant

## 2021-01-09 ENCOUNTER — Ambulatory Visit (INDEPENDENT_AMBULATORY_CARE_PROVIDER_SITE_OTHER): Payer: Medicare Other | Admitting: Physician Assistant

## 2021-01-09 ENCOUNTER — Encounter (INDEPENDENT_AMBULATORY_CARE_PROVIDER_SITE_OTHER): Payer: Self-pay | Admitting: Physician Assistant

## 2021-01-09 ENCOUNTER — Other Ambulatory Visit: Payer: Self-pay

## 2021-01-09 VITALS — BP 117/77 | HR 68 | Temp 97.6°F | Ht 67.0 in | Wt 328.0 lb

## 2021-01-09 DIAGNOSIS — Z6841 Body Mass Index (BMI) 40.0 and over, adult: Secondary | ICD-10-CM

## 2021-01-09 DIAGNOSIS — R7303 Prediabetes: Secondary | ICD-10-CM

## 2021-01-09 DIAGNOSIS — E559 Vitamin D deficiency, unspecified: Secondary | ICD-10-CM | POA: Diagnosis not present

## 2021-01-09 MED ORDER — BD PEN NEEDLE NANO 2ND GEN 32G X 4 MM MISC: 1.0000 | Freq: Two times a day (BID) | 0 refills | Status: AC

## 2021-01-09 MED ORDER — VITAMIN D (ERGOCALCIFEROL) 1.25 MG (50000 UNIT) PO CAPS: 50000.0000 [IU] | ORAL_CAPSULE | ORAL | 0 refills | Status: DC

## 2021-01-09 MED ORDER — LIRAGLUTIDE 18 MG/3ML ~~LOC~~ SOPN: 1.8000 mg | PEN_INJECTOR | Freq: Every day | SUBCUTANEOUS | 0 refills | Status: DC

## 2021-01-10 NOTE — Progress Notes (Unsigned)
Chief Complaint:   OBESITY Dory is here to discuss her progress with her obesity treatment plan along with follow-up of her obesity related diagnoses. Jenasis is on the Category 2 Plan and states she is following her eating plan approximately 50% of the time. Maelee states she is doing 0 minutes 0 times per week.  Today's visit was #: 7 Starting weight: 339 lbs Starting date: 06/12/2020 Today's weight: 328 lbs Today's date: 01/09/2021 Total lbs lost to date: 11 Total lbs lost since last in-office visit: 6  Interim History: Eimy states that she is having an issue with cravings especially for sweets and that appetite is not her issue. She is on Victoza 0.6 mg without side effects. She is skipping meals and only eating 1-2 per day.  Subjective:   1. Pre-diabetes Jaydalyn's last A1c was 5.8. She is on Victoza 0.6 mg and she is tolerating it well.  2. Vitamin D deficiency Ivory is on Vit D, and she denies nausea, vomiting, or muscle weakness. Last Vit D level was not at goal.  Assessment/Plan:   1. Pre-diabetes Gael will continue to work on weight loss, exercise, and decreasing simple carbohydrates to help decrease the risk of diabetes. Ameera agreed to increase Victoza to 1.2 mg and we will refill for 1 month, and we will refill nano needles #100 with no refills.  - Insulin Pen Needle (BD PEN NEEDLE NANO 2ND GEN) 32G X 4 MM MISC; 1 Package by Does not apply route 2 (two) times daily.  Dispense: 100 each; Refill: 0 - liraglutide (VICTOZA) 18 MG/3ML SOPN; Inject 1.8 mg into the skin daily.  Dispense: 6 mL; Refill: 0  2. Vitamin D deficiency Low Vitamin D level contributes to fatigue and are associated with obesity, breast, and colon cancer. We will refill prescription Vitamin D for 1 month. Sapphira will follow-up for routine testing of Vitamin D, at least 2-3 times per year to avoid over-replacement.  - Vitamin D, Ergocalciferol, (DRISDOL) 1.25 MG (50000 UNIT) CAPS  capsule; Take 1 capsule (50,000 Units total) by mouth every 7 (seven) days.  Dispense: 4 capsule; Refill: 0  3. Class 3 severe obesity with serious comorbidity and body mass index (BMI) of 50.0 to 59.9 in adult, unspecified obesity type (HCC) Kaislee is currently in the action stage of change. As such, her goal is to continue with weight loss efforts. She has agreed to the Category 2 Plan.   We discussed Wellbutrin. Troyce is to discuss with her primary care physician since he started her on phentermine recently. We will recheck fasting labs at her next visit.  Exercise goals: No exercise has been prescribed at this time.  Behavioral modification strategies: increasing lean protein intake, decreasing simple carbohydrates and no skipping meals.  Kayona has agreed to follow-up with our clinic in 2 weeks. She was informed of the importance of frequent follow-up visits to maximize her success with intensive lifestyle modifications for her multiple health conditions.   Objective:   Blood pressure 117/77, pulse 68, temperature 97.6 F (36.4 C), height 5\' 7"  (1.702 m), weight (!) 328 lb (148.8 kg), last menstrual period 04/20/2011, SpO2 100 %. Body mass index is 51.37 kg/m.  General: Cooperative, alert, well developed, in no acute distress. HEENT: Conjunctivae and lids unremarkable. Cardiovascular: Regular rhythm.  Lungs: Normal work of breathing. Neurologic: No focal deficits.   Lab Results  Component Value Date   CREATININE 0.89 06/12/2020   BUN 17 06/12/2020   NA 141 06/12/2020  K 4.1 06/12/2020   CL 102 06/12/2020   CO2 27 06/12/2020   Lab Results  Component Value Date   ALT 25 06/12/2020   AST 16 06/12/2020   ALKPHOS 95 06/12/2020   BILITOT 0.4 06/12/2020   Lab Results  Component Value Date   HGBA1C 5.8 (H) 06/12/2020   Lab Results  Component Value Date   INSULIN 12.8 06/12/2020   Lab Results  Component Value Date   TSH 1.120 06/12/2020   Lab Results  Component  Value Date   CHOL 177 06/12/2020   HDL 55 06/12/2020   LDLCALC 104 (H) 06/12/2020   TRIG 102 06/12/2020   CHOLHDL 3.1 Ratio 05/09/2010   Lab Results  Component Value Date   WBC 7.2 07/01/2018   HGB 14.0 07/01/2018   HCT 40.9 07/01/2018   MCV 91.3 07/01/2018   PLT 406 (H) 07/01/2018   Lab Results  Component Value Date   IRON 93 04/13/2010   TIBC 356 04/13/2010    Obesity Behavioral Intervention:   Approximately 15 minutes were spent on the discussion below.  ASK: We discussed the diagnosis of obesity with [name] today and Althea agreed to give [name] permission to discuss obesity behavioral modification therapy today.  ASSESS: Kalynne has the diagnosis of obesity and her BMI today is 51.36. Emilyann is in the action stage of change.   ADVISE: Merryn was educated on the multiple health risks of obesity as well as the benefit of weight loss to improve her health. She was advised of the need for long term treatment and the importance of lifestyle modifications to improve her current health and to decrease her risk of future health problems.  AGREE: Multiple dietary modification options and treatment options were discussed and Pandora agreed to follow the recommendations documented in the above note.  ARRANGE: Shaundrea was educated on the importance of frequent visits to treat obesity as outlined per CMS and USPSTF guidelines and agreed to schedule her next follow up appointment today.  Attestation Statements:   Reviewed by clinician on day of visit: allergies, medications, problem list, medical history, surgical history, family history, social history, and previous encounter notes.   Wilhemena Durie, am acting as transcriptionist for [employer], PA-C.  I have reviewed the above documentation for accuracy and completeness, and I agree with the above. Abby Potash, PA-C

## 2021-01-24 ENCOUNTER — Ambulatory Visit: Payer: Medicare Other | Admitting: Physician Assistant

## 2021-01-25 ENCOUNTER — Ambulatory Visit (INDEPENDENT_AMBULATORY_CARE_PROVIDER_SITE_OTHER): Payer: Medicare Other | Admitting: Physician Assistant

## 2021-01-30 ENCOUNTER — Encounter (INDEPENDENT_AMBULATORY_CARE_PROVIDER_SITE_OTHER): Payer: Self-pay | Admitting: Bariatrics

## 2021-01-30 ENCOUNTER — Ambulatory Visit (INDEPENDENT_AMBULATORY_CARE_PROVIDER_SITE_OTHER): Payer: Medicare Other | Admitting: Bariatrics

## 2021-01-30 ENCOUNTER — Other Ambulatory Visit: Payer: Self-pay

## 2021-01-30 VITALS — BP 126/81 | HR 59 | Temp 97.7°F | Ht 67.0 in | Wt 324.0 lb

## 2021-01-30 DIAGNOSIS — R7303 Prediabetes: Secondary | ICD-10-CM | POA: Diagnosis not present

## 2021-01-30 DIAGNOSIS — Z6841 Body Mass Index (BMI) 40.0 and over, adult: Secondary | ICD-10-CM | POA: Diagnosis not present

## 2021-01-30 DIAGNOSIS — E559 Vitamin D deficiency, unspecified: Secondary | ICD-10-CM

## 2021-01-30 MED ORDER — VITAMIN D (ERGOCALCIFEROL) 1.25 MG (50000 UNIT) PO CAPS: 50000.0000 [IU] | ORAL_CAPSULE | ORAL | 0 refills | Status: DC

## 2021-02-05 ENCOUNTER — Encounter (INDEPENDENT_AMBULATORY_CARE_PROVIDER_SITE_OTHER): Payer: Self-pay | Admitting: Bariatrics

## 2021-02-05 NOTE — Progress Notes (Signed)
Chief Complaint:   OBESITY July is here to discuss her progress with her obesity treatment plan along with follow-up of her obesity related diagnoses. Fani is on the Category 2 Plan and states she is following her eating plan approximately 50% of the time. Tanyah states she is walking for 10 minutes 3 times per week.  Today's visit was #: 8 Starting weight: 339 lbs Starting date: 06/12/2020 Today's weight: 324 lbs Today's date: 01/30/2021 Total lbs lost to date: 15 lbs Total lbs lost since last in-office visit: 4 lbs  Interim History: Audryna is down 4 pounds since her last visit and has done fairly well overall.  She does not have an appetite and skips meals.  Subjective:   1. Vitamin D deficiency Tami's Vitamin D level was 31.7 on 06/12/2020. She is currently taking prescription vitamin D 50,000 IU each week. She denies nausea, vomiting or muscle weakness.  She gets minimal sun exposure.  2. Prediabetes Rupa has a diagnosis of prediabetes based on her elevated HgA1c and was informed this puts her at greater risk of developing diabetes. She continues to work on diet and exercise to decrease her risk of diabetes. She denies nausea or hypoglycemia.  She is taking Victoza 1.8 mg subcutaneously daily.  Lab Results  Component Value Date   HGBA1C 5.8 (H) 06/12/2020   Lab Results  Component Value Date   INSULIN 12.8 06/12/2020   Assessment/Plan:   1. Vitamin D deficiency Low Vitamin D level contributes to fatigue and are associated with obesity, breast, and colon cancer. She agrees to continue to take prescription Vitamin D @50 ,000 IU every week and will follow-up for routine testing of Vitamin D, at least 2-3 times per year to avoid over-replacement.  - Refill Vitamin D, Ergocalciferol, (DRISDOL) 1.25 MG (50000 UNIT) CAPS capsule; Take 1 capsule (50,000 Units total) by mouth every 7 (seven) days.  Dispense: 4 capsule; Refill: 0  2. Prediabetes Amazin will continue  to work on weight loss, exercise, and decreasing simple carbohydrates to help decrease the risk of diabetes.  Continue Victoza.  3. Class 3 severe obesity with serious comorbidity and body mass index (BMI) of 50.0 to 59.9 in adult, unspecified obesity type (HCC)  Shamila is currently in the action stage of change. As such, her goal is to continue with weight loss efforts. She has agreed to the Category 2 Plan.   She will work on meal planing, intentional eating, will stop the phentermine to help promote appetite, and will have a protein shake in the morning, salad with meat, eggs with cheese.  Exercise goals: As is.  Behavioral modification strategies: increasing lean protein intake, decreasing simple carbohydrates, increasing vegetables, increasing water intake, decreasing eating out, no skipping meals, meal planning and cooking strategies, keeping healthy foods in the home and planning for success.  Mina has agreed to follow-up with our clinic in 2-3 weeks, fasting, with Abby Potash, PA-C. She was informed of the importance of frequent follow-up visits to maximize her success with intensive lifestyle modifications for her multiple health conditions.   Objective:   Blood pressure 126/81, pulse (!) 59, temperature 97.7 F (36.5 C), height 5\' 7"  (1.702 m), weight (!) 324 lb (147 kg), last menstrual period 04/20/2011, SpO2 100 %. Body mass index is 50.75 kg/m.  General: Cooperative, alert, well developed, in no acute distress. HEENT: Conjunctivae and lids unremarkable. Cardiovascular: Regular rhythm.  Lungs: Normal work of breathing. Neurologic: No focal deficits.   Lab Results  Component  Value Date   CREATININE 0.89 06/12/2020   BUN 17 06/12/2020   NA 141 06/12/2020   K 4.1 06/12/2020   CL 102 06/12/2020   CO2 27 06/12/2020   Lab Results  Component Value Date   ALT 25 06/12/2020   AST 16 06/12/2020   ALKPHOS 95 06/12/2020   BILITOT 0.4 06/12/2020   Lab Results   Component Value Date   HGBA1C 5.8 (H) 06/12/2020   Lab Results  Component Value Date   INSULIN 12.8 06/12/2020   Lab Results  Component Value Date   TSH 1.120 06/12/2020   Lab Results  Component Value Date   CHOL 177 06/12/2020   HDL 55 06/12/2020   LDLCALC 104 (H) 06/12/2020   TRIG 102 06/12/2020   CHOLHDL 3.1 Ratio 05/09/2010   Lab Results  Component Value Date   WBC 7.2 07/01/2018   HGB 14.0 07/01/2018   HCT 40.9 07/01/2018   MCV 91.3 07/01/2018   PLT 406 (H) 07/01/2018   Lab Results  Component Value Date   IRON 93 04/13/2010   TIBC 356 04/13/2010   Obesity Behavioral Intervention:   Approximately 15 minutes were spent on the discussion below.  ASK: We discussed the diagnosis of obesity with [name] today and Anasophia agreed to give [name] permission to discuss obesity behavioral modification therapy today.  ASSESS: Soma has the diagnosis of obesity and her BMI today is 50.8. Media is in the action stage of change.   ADVISE: Lennie was educated on the multiple health risks of obesity as well as the benefit of weight loss to improve her health. She was advised of the need for long term treatment and the importance of lifestyle modifications to improve her current health and to decrease her risk of future health problems.  AGREE: Multiple dietary modification options and treatment options were discussed and Gladie agreed to follow the recommendations documented in the above note.  ARRANGE: Ryliegh was educated on the importance of frequent visits to treat obesity as outlined per CMS and USPSTF guidelines and agreed to schedule her next follow up appointment today.  Attestation Statements:   Reviewed by clinician on day of visit: allergies, medications, problem list, medical history, surgical history, family history, social history, and previous encounter notes.  I, Water quality scientist, CMA, am acting as Location manager for [employer], DO  I have reviewed the  above documentation for accuracy and completeness, and I agree with the above. Jearld Lesch, DO

## 2021-02-20 ENCOUNTER — Ambulatory Visit (INDEPENDENT_AMBULATORY_CARE_PROVIDER_SITE_OTHER): Payer: Medicare Other | Admitting: Physician Assistant

## 2021-02-21 ENCOUNTER — Other Ambulatory Visit: Payer: Self-pay

## 2021-02-21 ENCOUNTER — Ambulatory Visit (INDEPENDENT_AMBULATORY_CARE_PROVIDER_SITE_OTHER): Payer: Medicare Other | Admitting: Adult Health

## 2021-02-21 ENCOUNTER — Encounter (INDEPENDENT_AMBULATORY_CARE_PROVIDER_SITE_OTHER): Payer: Self-pay | Admitting: Adult Health

## 2021-02-21 VITALS — BP 109/73 | HR 70 | Temp 97.7°F | Ht 67.0 in | Wt 324.0 lb

## 2021-02-21 DIAGNOSIS — I10 Essential (primary) hypertension: Secondary | ICD-10-CM

## 2021-02-21 DIAGNOSIS — E559 Vitamin D deficiency, unspecified: Secondary | ICD-10-CM | POA: Diagnosis not present

## 2021-02-21 DIAGNOSIS — Z6841 Body Mass Index (BMI) 40.0 and over, adult: Secondary | ICD-10-CM | POA: Diagnosis not present

## 2021-02-21 DIAGNOSIS — R7303 Prediabetes: Secondary | ICD-10-CM

## 2021-02-21 DIAGNOSIS — E7800 Pure hypercholesterolemia, unspecified: Secondary | ICD-10-CM | POA: Diagnosis not present

## 2021-02-22 LAB — LIPID PANEL
Chol/HDL Ratio: 3.7 ratio (ref 0.0–4.4)
Cholesterol, Total: 201 mg/dL — ABNORMAL HIGH (ref 100–199)
HDL: 55 mg/dL (ref >39)
LDL Chol Calc (NIH): 129 mg/dL — ABNORMAL HIGH (ref 0–99)
Triglycerides: 92 mg/dL (ref 0–149)
VLDL Cholesterol Cal: 17 mg/dL (ref 5–40)

## 2021-02-22 LAB — COMPREHENSIVE METABOLIC PANEL
ALT: 17 IU/L (ref 0–32)
AST: 18 IU/L (ref 0–40)
Albumin/Globulin Ratio: 1.4 (ref 1.2–2.2)
Albumin: 4 g/dL (ref 3.8–4.8)
Alkaline Phosphatase: 88 IU/L (ref 44–121)
BUN/Creatinine Ratio: 15 (ref 12–28)
BUN: 14 mg/dL (ref 8–27)
Bilirubin Total: 0.5 mg/dL (ref 0.0–1.2)
CO2: 24 mmol/L (ref 20–29)
Calcium: 9.6 mg/dL (ref 8.7–10.3)
Chloride: 98 mmol/L (ref 96–106)
Creatinine, Ser: 0.92 mg/dL (ref 0.57–1.00)
Globulin, Total: 2.8 g/dL (ref 1.5–4.5)
Glucose: 89 mg/dL (ref 65–99)
Potassium: 3.9 mmol/L (ref 3.5–5.2)
Sodium: 140 mmol/L (ref 134–144)
Total Protein: 6.8 g/dL (ref 6.0–8.5)
eGFR: 71 mL/min/{1.73_m2} (ref >59)

## 2021-02-22 LAB — HEMOGLOBIN A1C
Est. average glucose Bld gHb Est-mCnc: 123 mg/dL
Hgb A1c MFr Bld: 5.9 % — ABNORMAL HIGH (ref 4.8–5.6)

## 2021-02-22 LAB — VITAMIN D 25 HYDROXY (VIT D DEFICIENCY, FRACTURES): Vit D, 25-Hydroxy: 51.1 ng/mL (ref 30.0–100.0)

## 2021-02-22 LAB — INSULIN, RANDOM: INSULIN: 10.5 u[IU]/mL (ref 2.6–24.9)

## 2021-02-22 NOTE — Progress Notes (Signed)
Chief Complaint:   OBESITY [name] is here to discuss her progress with her obesity treatment plan along with follow-up of her obesity related diagnoses. [name] is on the Category 2 Plan and states she is following her eating plan approximately 60-70% of the time. [name] states she is doing 0 minutes 0 times per week.  Today's visit was #: 9 Starting weight: 339 lbs Starting date: 06/12/2020 Today's weight: 324 lbs Today's date: 02/21/2021 Total lbs lost to date: 15 lbs Total lbs lost since last in-office visit: 0  Interim History: [name] would like to continue phentermine. Per pt, she has been on and off this Rx for more than 20 years. PDMP reviewed and last refill of phentermine 37.5 mg was from her PCP. We discussed the risks and benefits of phentermine.  Subjective:   1. Pre-diabetes [name] is on Victoza 1.2 mg QD. She is unaware of dosage to increase to 1.8 mg QD. She denies GI upset with the 1.2mg  daily dose.  2. Vitamin D deficiency [name]'s Vitamin D level was 31.7 on 06/12/2020. She is currently taking prescription vitamin D 50,000 IU each week. She denies nausea, vomiting or muscle weakness.   Ref. Range 06/12/2020 14:43  Vitamin D, 25-Hydroxy Latest Ref Range: 30.0 - 100.0 ng/mL 31.7   3. Essential hypertension [name]'s BP and heart rate are stable in OPV. She is on atenolol 100 mg and HCTZ 25 mg.  BP Readings from Last 3 Encounters:  02/21/21 109/73  01/30/21 126/81  01/09/21 117/77    4. Pure hypercholesterolemia [name]'s last lipid panel showed her LDL was slightly above goal. She is not on statin therapy.  Assessment/Plan:   1. Pre-diabetes Eilish will continue to work on weight loss, exercise, and decreasing simple carbohydrates to help decrease the risk of diabetes.  Increase Victoza to 1.8 mg QD. No refill needed at this time. Check labs today.  - Hemoglobin A1c - Insulin, random  2. Vitamin D deficiency Low Vitamin D level contributes to  fatigue and are associated with obesity, breast, and colon cancer. She agrees to continue to take prescription Vitamin D @50 ,000 IU every week and will follow-up for routine testing of Vitamin D, at least 2-3 times per year to avoid over-replacement. Check labs today.  - VITAMIN D 25 Hydroxy (Vit-D Deficiency, Fractures)  3. Essential hypertension [name] is working on healthy weight loss and exercise to improve blood pressure control. We will watch for signs of hypotension as she continues her lifestyle modifications. Check labs today.  - Comprehensive metabolic panel  4. Pure hypercholesterolemia Cardiovascular risk and specific lipid/LDL goals reviewed.  We discussed several lifestyle modifications today and [name] will continue to work on diet, exercise and weight loss efforts. Orders and follow up as documented in patient record. Check labs today.  Counseling Intensive lifestyle modifications are the first line treatment for this issue. . Dietary changes: Increase soluble fiber. Decrease simple carbohydrates. . Exercise changes: Moderate to vigorous-intensity aerobic activity 150 minutes per week if tolerated. . Lipid-lowering medications: see documented in medical record.  - Lipid panel  5. Class 3 severe obesity with serious comorbidity and body mass index (BMI) of 50.0 to 59.9 in adult, unspecified obesity type (HCC) [name] is currently in the action stage of change. As such, her goal is to continue with weight loss efforts. She has agreed to the Category 2 Plan.   Focus on healthy eating and use of medications to treat metabolic condition to treat obesity. Will to continue  phentermine regimen here.  Exercise goals: No exercise has been prescribed at this time.  Behavioral modification strategies: increasing lean protein intake, meal planning and cooking strategies and planning for success.  [name] has agreed to follow-up with our clinic in 2 weeks. She was informed of the  importance of frequent follow-up visits to maximize her success with intensive lifestyle modifications for her multiple health conditions.   [name] was informed we would discuss her lab results at her next visit unless there is a critical issue that needs to be addressed sooner. [name] agreed to keep her next visit at the agreed upon time to discuss these results.  Objective:   Blood pressure 109/73, pulse 70, temperature 97.7 F (36.5 C), height 5\' 7"  (1.702 m), weight (!) 324 lb (147 kg), last menstrual period 04/20/2011, SpO2 99 %. Body mass index is 50.75 kg/m.  General: Cooperative, alert, well developed, in no acute distress. HEENT: Conjunctivae and lids unremarkable. Cardiovascular: Regular rhythm.  Lungs: Normal work of breathing. Neurologic: No focal deficits.   Lab Results  Component Value Date   CREATININE 0.92 02/21/2021   BUN 14 02/21/2021   NA 140 02/21/2021   K 3.9 02/21/2021   CL 98 02/21/2021   CO2 24 02/21/2021   Lab Results  Component Value Date   ALT 17 02/21/2021   AST 18 02/21/2021   ALKPHOS 88 02/21/2021   BILITOT 0.5 02/21/2021   Lab Results  Component Value Date   HGBA1C 5.9 (H) 02/21/2021   HGBA1C 5.8 (H) 06/12/2020   Lab Results  Component Value Date   INSULIN WILL FOLLOW 02/21/2021   INSULIN 12.8 06/12/2020   Lab Results  Component Value Date   TSH 1.120 06/12/2020   Lab Results  Component Value Date   CHOL 201 (H) 02/21/2021   HDL 55 02/21/2021   LDLCALC 129 (H) 02/21/2021   TRIG 92 02/21/2021   CHOLHDL 3.7 02/21/2021   Lab Results  Component Value Date   WBC 7.2 07/01/2018   HGB 14.0 07/01/2018   HCT 40.9 07/01/2018   MCV 91.3 07/01/2018   PLT 406 (H) 07/01/2018   Lab Results  Component Value Date   IRON 93 04/13/2010   TIBC 356 04/13/2010    Obesity Behavioral Intervention:   Approximately 15 minutes were spent on the discussion below.  ASK: We discussed the diagnosis of obesity with [name] today and [name]  agreed to give [name] permission to discuss obesity behavioral modification therapy today.  ASSESS: [name] has the diagnosis of obesity and her BMI today is 50.9. [name] is in the action stage of change.   ADVISE: [place] was educated on the multiple health risks of obesity as well as the benefit of weight loss to improve her health. She was advised of the need for long term treatment and the importance of lifestyle modifications to improve her current health and to decrease her risk of future health problems.  AGREE: Multiple dietary modification options and treatment options were discussed and Anhthu agreed to follow the recommendations documented in the above note.  ARRANGE: Deziree was educated on the importance of frequent visits to treat obesity as outlined per CMS and USPSTF guidelines and agreed to schedule her next follow up appointment today.  Attestation Statements:   Reviewed by clinician on day of visit: allergies, medications, problem list, medical history, surgical history, family history, social history, and previous encounter notes.  Coral Ceo, am acting as Location manager for Mina Marble, NP.  I have reviewed the  above documentation for accuracy and completeness, and I agree with the above. -  Tavaris Eudy d. Neeko Pharo, NP-C

## 2021-03-08 ENCOUNTER — Encounter (INDEPENDENT_AMBULATORY_CARE_PROVIDER_SITE_OTHER): Payer: Self-pay | Admitting: Bariatrics

## 2021-03-08 ENCOUNTER — Ambulatory Visit (INDEPENDENT_AMBULATORY_CARE_PROVIDER_SITE_OTHER): Payer: Medicare Other | Admitting: Bariatrics

## 2021-03-08 ENCOUNTER — Other Ambulatory Visit: Payer: Self-pay

## 2021-03-08 VITALS — BP 130/82 | HR 76 | Temp 98.6°F | Ht 67.0 in | Wt 326.0 lb

## 2021-03-08 DIAGNOSIS — E559 Vitamin D deficiency, unspecified: Secondary | ICD-10-CM

## 2021-03-08 DIAGNOSIS — Z6841 Body Mass Index (BMI) 40.0 and over, adult: Secondary | ICD-10-CM

## 2021-03-08 DIAGNOSIS — R7303 Prediabetes: Secondary | ICD-10-CM

## 2021-03-08 MED ORDER — LIRAGLUTIDE 18 MG/3ML ~~LOC~~ SOPN: 1.8000 mg | PEN_INJECTOR | Freq: Every day | SUBCUTANEOUS | 0 refills | Status: DC

## 2021-03-08 MED ORDER — VITAMIN D (ERGOCALCIFEROL) 1.25 MG (50000 UNIT) PO CAPS: 50000.0000 [IU] | ORAL_CAPSULE | ORAL | 0 refills | Status: DC

## 2021-03-13 NOTE — Progress Notes (Signed)
Chief Complaint:   OBESITY [name] is here to discuss her progress with her obesity treatment plan along with follow-up of her obesity related diagnoses. [name] is on the Category 2 Plan and states she is following her eating plan approximately 0% of the time. [name] states she is not exercising regularly.  Today's visit was #: 10 Starting weight: 339 lbs Starting date: 06/12/2020 Today's weight: 326 lbs Today's date: 03/08/2021 Total lbs lost to date: 13 lbs Total lbs lost since last in-office visit: 0  Interim History: [name] is up 2 pounds since her last visit.  She has lost her motivation.  She has not followed the plan very closely.  Subjective:   1. Prediabetes [name] has a diagnosis of prediabetes based on her elevated HgA1c and was informed this puts her at greater risk of developing diabetes. She continues to work on diet and exercise to decrease her risk of diabetes. She denies nausea or hypoglycemia.  She is taking Victoza.  Tolerating well.  She says it helps with her appetite.  Lab Results  Component Value Date   HGBA1C 5.9 (H) 02/21/2021   Lab Results  Component Value Date   INSULIN 10.5 02/21/2021   INSULIN 12.8 06/12/2020   2. Vitamin D deficiency [name]'s Vitamin D level was 51.1 on 02/21/2021. She is currently taking prescription vitamin D 50,000 IU each week. She denies nausea, vomiting or muscle weakness.  Minimal sun exposure.  Assessment/Plan:   1. Prediabetes [name] will continue to work on weight loss, exercise, and decreasing simple carbohydrates to help decrease the risk of diabetes.  Will refill Victoza, as per below.  - Refill liraglutide (VICTOZA) 18 MG/3ML SOPN; Inject 1.8 mg into the skin daily.  Dispense: 6 mL; Refill: 0  2. Vitamin D deficiency Low Vitamin D level contributes to fatigue and are associated with obesity, breast, and colon cancer. She agrees to continue to take prescription Vitamin D @50 ,000 IU every week and will  follow-up for routine testing of Vitamin D, at least 2-3 times per year to avoid over-replacement.  - Refill Vitamin D, Ergocalciferol, (DRISDOL) 1.25 MG (50000 UNIT) CAPS capsule; Take 1 capsule (50,000 Units total) by mouth every 7 (seven) days.  Dispense: 4 capsule; Refill: 0  3. Class 3 severe obesity with serious comorbidity and body mass index (BMI) of 50.0 to 59.9 in adult, unspecified obesity type (HCC)  [name] is currently in the action stage of change. As such, her goal is to continue with weight loss efforts. She has agreed to the Category 2 Plan.   She will work on meal planning, intentional eating, and will have a protein shake in the morning.  Labs from 02/21/2021, including CMP, lipids, vitamin D, A1c, and insulin, were reviewed today.  Exercise goals: All adults should avoid inactivity. Some physical activity is better than none, and adults who participate in any amount of physical activity gain some health benefits.  Behavioral modification strategies: increasing lean protein intake, decreasing simple carbohydrates, increasing vegetables, increasing water intake, decreasing eating out, no skipping meals, meal planning and cooking strategies, keeping healthy foods in the home and planning for success.  [name] has agreed to follow-up with our clinic in 2-3 weeks with Danielle Marble, NP. She was informed of the importance of frequent follow-up visits to maximize her success with intensive lifestyle modifications for her multiple health conditions.   Objective:   Blood pressure 130/82, pulse 76, temperature 98.6 F (37 C), height 5\' 7"  (1.702 m), weight Marland Kitchen)  326 lb (147.9 kg), last menstrual period 04/20/2011, SpO2 97 %. Body mass index is 51.06 kg/m.  General: Cooperative, alert, well developed, in no acute distress. HEENT: Conjunctivae and lids unremarkable. Cardiovascular: Regular rhythm.  Lungs: Normal work of breathing. Neurologic: No focal deficits.   Lab Results   Component Value Date   CREATININE 0.92 02/21/2021   BUN 14 02/21/2021   NA 140 02/21/2021   K 3.9 02/21/2021   CL 98 02/21/2021   CO2 24 02/21/2021   Lab Results  Component Value Date   ALT 17 02/21/2021   AST 18 02/21/2021   ALKPHOS 88 02/21/2021   BILITOT 0.5 02/21/2021   Lab Results  Component Value Date   HGBA1C 5.9 (H) 02/21/2021   HGBA1C 5.8 (H) 06/12/2020   Lab Results  Component Value Date   INSULIN 10.5 02/21/2021   INSULIN 12.8 06/12/2020   Lab Results  Component Value Date   TSH 1.120 06/12/2020   Lab Results  Component Value Date   CHOL 201 (H) 02/21/2021   HDL 55 02/21/2021   LDLCALC 129 (H) 02/21/2021   TRIG 92 02/21/2021   CHOLHDL 3.7 02/21/2021   Lab Results  Component Value Date   WBC 7.2 07/01/2018   HGB 14.0 07/01/2018   HCT 40.9 07/01/2018   MCV 91.3 07/01/2018   PLT 406 (H) 07/01/2018   Lab Results  Component Value Date   IRON 93 04/13/2010   TIBC 356 04/13/2010   Obesity Behavioral Intervention:   Approximately 15 minutes were spent on the discussion below.  ASK: We discussed the diagnosis of obesity with [name] today and [name] agreed to give [name] permission to discuss obesity behavioral modification therapy today.  ASSESS: [name] has the diagnosis of obesity and her BMI today is 51.1. [name] is in the action stage of change.   ADVISE: [name] was educated on the multiple health risks of obesity as well as the benefit of weight loss to improve her health. She was advised of the need for long term treatment and the importance of lifestyle modifications to improve her current health and to decrease her risk of future health problems.  AGREE: Multiple dietary modification options and treatment options were discussed and [name] agreed to follow the recommendations documented in the above note.  ARRANGE: [name] was educated on the importance of frequent visits to treat obesity as outlined per CMS and USPSTF guidelines and  agreed to schedule her next follow up appointment today.  Attestation Statements:   Reviewed by clinician on day of visit: allergies, medications, problem list, medical history, surgical history, family history, social history, and previous encounter notes.  I, Water quality scientist, CMA, am acting as Location manager for [employer], DO  I have reviewed the above documentation for accuracy and completeness, and I agree with the above. Jearld Lesch, DO

## 2021-03-14 ENCOUNTER — Ambulatory Visit: Payer: Medicare Other | Admitting: Physician Assistant

## 2021-03-14 ENCOUNTER — Telehealth: Payer: Self-pay

## 2021-03-14 NOTE — Telephone Encounter (Signed)
lvm for pt to call back to r/s appt for this afternoon due to [facility] joining Dr. Ninfa Linden in emergency surgery this afternoon

## 2021-03-21 ENCOUNTER — Encounter: Payer: Self-pay | Admitting: Physician Assistant

## 2021-03-21 ENCOUNTER — Ambulatory Visit (INDEPENDENT_AMBULATORY_CARE_PROVIDER_SITE_OTHER): Payer: Medicare Other | Admitting: Physician Assistant

## 2021-03-21 VITALS — Ht 67.0 in | Wt 328.0 lb

## 2021-03-21 DIAGNOSIS — M1711 Unilateral primary osteoarthritis, right knee: Secondary | ICD-10-CM | POA: Diagnosis not present

## 2021-03-21 MED ORDER — LIDOCAINE HCL 1 % IJ SOLN
0.5000 mL | INTRAMUSCULAR | Status: AC | PRN
  Administered 2021-03-21: .5 mL

## 2021-03-21 MED ORDER — METHYLPREDNISOLONE ACETATE 40 MG/ML IJ SUSP
40.0000 mg | INTRAMUSCULAR | Status: AC | PRN
  Administered 2021-03-21: 40 mg via INTRA_ARTICULAR

## 2021-03-21 NOTE — Addendum Note (Signed)
Addended by: Robyne Peers on: 03/21/2021 03:49 PM   Modules accepted: Orders

## 2021-03-21 NOTE — Progress Notes (Signed)
   Procedure Note  Patient: [name]             Date of Birth: 07/09/1961           MRN: [id number]             Visit Date: 03/21/2021  HPI: [name] returns today due to right knee pain.  She was last seen 10/23/2020 was given cortisone injection.  She said this helped until about a month ago.  No new injury.  She has had a change in her medical status.  She is asking for cortisone injection in her right knee today.  She would also like her to physical therapy for both knees.  She states her knees feel like they are getting give way at times.  Review of systems: Denies any fevers or chills.  See HPI otherwise negative  Physical exam: General well-developed well-nourished female no acute distress. Right knee no abnormal warmth erythema or effusion.  Good range of motion of the right knee.  Tenderness along medial joint line.  No instability valgus varus stressing of the right knee.   Procedures: Visit Diagnoses:  1. Unilateral primary osteoarthritis, right knee     Large Joint Inj: R knee on 03/21/2021 2:31 PM Medications: 0.5 mL lidocaine 1 %; 40 mg methylPREDNISolone acetate 40 MG/ML    Plan: We will send her to formal physical therapy to work on range of motion strengthening both knees also home exercise program and modalities.  She understands to wait at least 3 months between cortisone injections.  Again she has failed gel injections in the past.  Questions were encouraged and answered

## 2021-03-29 ENCOUNTER — Encounter (INDEPENDENT_AMBULATORY_CARE_PROVIDER_SITE_OTHER): Payer: Self-pay | Admitting: Physician Assistant

## 2021-03-29 ENCOUNTER — Other Ambulatory Visit: Payer: Self-pay

## 2021-03-29 ENCOUNTER — Ambulatory Visit (INDEPENDENT_AMBULATORY_CARE_PROVIDER_SITE_OTHER): Payer: Medicare Other | Admitting: Physician Assistant

## 2021-03-29 VITALS — BP 104/65 | HR 65 | Temp 97.8°F | Ht 67.0 in | Wt 325.0 lb

## 2021-03-29 DIAGNOSIS — R7303 Prediabetes: Secondary | ICD-10-CM

## 2021-03-29 DIAGNOSIS — Z6841 Body Mass Index (BMI) 40.0 and over, adult: Secondary | ICD-10-CM

## 2021-03-29 DIAGNOSIS — E559 Vitamin D deficiency, unspecified: Secondary | ICD-10-CM | POA: Diagnosis not present

## 2021-03-29 MED ORDER — VITAMIN D3 125 MCG (5000 UT) PO CAPS: 5000.0000 [IU] | ORAL_CAPSULE | Freq: Every day | ORAL | 0 refills | Status: DC

## 2021-03-29 MED ORDER — OZEMPIC (0.25 OR 0.5 MG/DOSE) 2 MG/1.5ML ~~LOC~~ SOPN: 0.2500 mg | PEN_INJECTOR | SUBCUTANEOUS | 0 refills | Status: DC

## 2021-04-03 NOTE — Progress Notes (Signed)
Chief Complaint:   OBESITY Salsabeel is here to discuss her progress with her obesity treatment plan along with follow-up of her obesity related diagnoses. Lloyd is on the Category 2 Plan and states she is following her eating plan approximately 70% of the time. Larae states she is walking 10-15 minutes 2-3 times per week.  Today's visit was #: 11 Starting weight: 339 lbs Starting date: 06/12/2020 Today's weight: 325 lbs Today's date: 03/29/2021 Total lbs lost to date: 14 Total lbs lost since last in-office visit: 1  Interim History: Hydeia is disappointed with the lack of weight loss, as she feels that she has done well. She does not have an appetite for lunch and sometimes skips it. She does not weigh her protein at dinner.  Subjective:   1. Pre-diabetes Jonni forgets to take Victoza and feels that Victoza is not working for her.   2. Vitamin D deficiency Tremeka is on Vit D weekly. Her last level was at goal.  Assessment/Plan:   1. Pre-diabetes Janiah will continue to work on weight loss, exercise, and decreasing simple carbohydrates to help decrease the risk of diabetes. Change to Ozempic 0.25 mg, as prescribed below.  - Semaglutide,0.25 or 0.5MG /DOS, (OZEMPIC, 0.25 OR 0.5 MG/DOSE,) 2 MG/1.5ML SOPN; Inject 0.25 mg into the skin once a week.  Dispense: 1.5 mL; Refill: 0  2. Vitamin D deficiency Low Vitamin D level contributes to fatigue and are associated with obesity, breast, and colon cancer. She agrees to change to take Vitamin D @5 ,000 IU daily and will follow-up for routine testing of Vitamin D, at least 2-3 times per year to avoid over-replacement.  - Cholecalciferol (VITAMIN D3) 125 MCG (5000 UT) CAPS; Take 1 capsule (5,000 Units total) by mouth daily.  Dispense: 30 capsule; Refill: 0  3. Class 3 severe obesity with serious comorbidity and body mass index (BMI) of 50.0 to 59.9 in adult, unspecified obesity type (HCC) Avyana is currently in the action stage  of change. As such, her goal is to continue with weight loss efforts. She has agreed to the Category 2 Plan.   Exercise goals: As is  Behavioral modification strategies: increasing lean protein intake and no skipping meals.  Jakiyah has agreed to follow-up with our clinic in 3 weeks. She was informed of the importance of frequent follow-up visits to maximize her success with intensive lifestyle modifications for her multiple health conditions.   Objective:   Blood pressure 104/65, pulse 65, temperature 97.8 F (36.6 C), height 5\' 7"  (1.702 m), weight (!) 325 lb (147.4 kg), last menstrual period 04/20/2011, SpO2 98 %. Body mass index is 50.9 kg/m.  General: Cooperative, alert, well developed, in no acute distress. HEENT: Conjunctivae and lids unremarkable. Cardiovascular: Regular rhythm.  Lungs: Normal work of breathing. Neurologic: No focal deficits.   Lab Results  Component Value Date   CREATININE 0.92 02/21/2021   BUN 14 02/21/2021   NA 140 02/21/2021   K 3.9 02/21/2021   CL 98 02/21/2021   CO2 24 02/21/2021   Lab Results  Component Value Date   ALT 17 02/21/2021   AST 18 02/21/2021   ALKPHOS 88 02/21/2021   BILITOT 0.5 02/21/2021   Lab Results  Component Value Date   HGBA1C 5.9 (H) 02/21/2021   HGBA1C 5.8 (H) 06/12/2020   Lab Results  Component Value Date   INSULIN 10.5 02/21/2021   INSULIN 12.8 06/12/2020   Lab Results  Component Value Date   TSH 1.120 06/12/2020  Lab Results  Component Value Date   CHOL 201 (H) 02/21/2021   HDL 55 02/21/2021   LDLCALC 129 (H) 02/21/2021   TRIG 92 02/21/2021   CHOLHDL 3.7 02/21/2021   Lab Results  Component Value Date   WBC 7.2 07/01/2018   HGB 14.0 07/01/2018   HCT 40.9 07/01/2018   MCV 91.3 07/01/2018   PLT 406 (H) 07/01/2018   Lab Results  Component Value Date   IRON 93 04/13/2010   TIBC 356 04/13/2010    Obesity Behavioral Intervention:   Approximately 15 minutes were spent on the discussion  below.  ASK: We discussed the diagnosis of obesity with [name] today and Kareem agreed to give [name] permission to discuss obesity behavioral modification therapy today.  ASSESS: Aiyana has the diagnosis of obesity and her BMI today is 51.0. Jeanae is in the action stage of change.   ADVISE: Lillion was educated on the multiple health risks of obesity as well as the benefit of weight loss to improve her health. She was advised of the need for long term treatment and the importance of lifestyle modifications to improve her current health and to decrease her risk of future health problems.  AGREE: Multiple dietary modification options and treatment options were discussed and Laurette agreed to follow the recommendations documented in the above note.  ARRANGE: Gertha was educated on the importance of frequent visits to treat obesity as outlined per CMS and USPSTF guidelines and agreed to schedule her next follow up appointment today.  Attestation Statements:   Reviewed by clinician on day of visit: allergies, medications, problem list, medical history, surgical history, family history, social history, and previous encounter notes.  Coral Ceo, am acting as Location manager for [employer], PA-C.  I have reviewed the above documentation for accuracy and completeness, and I agree with the above. Abby Potash, PA-C

## 2021-04-10 ENCOUNTER — Other Ambulatory Visit: Payer: Self-pay

## 2021-04-10 ENCOUNTER — Ambulatory Visit: Payer: Medicare Other | Attending: Physician Assistant | Admitting: Physical Therapy

## 2021-04-10 DIAGNOSIS — M25662 Stiffness of left knee, not elsewhere classified: Secondary | ICD-10-CM

## 2021-04-10 DIAGNOSIS — M25661 Stiffness of right knee, not elsewhere classified: Secondary | ICD-10-CM

## 2021-04-10 DIAGNOSIS — G8929 Other chronic pain: Secondary | ICD-10-CM | POA: Diagnosis present

## 2021-04-10 DIAGNOSIS — M6281 Muscle weakness (generalized): Secondary | ICD-10-CM

## 2021-04-10 DIAGNOSIS — R262 Difficulty in walking, not elsewhere classified: Secondary | ICD-10-CM | POA: Diagnosis present

## 2021-04-10 DIAGNOSIS — R2689 Other abnormalities of gait and mobility: Secondary | ICD-10-CM | POA: Diagnosis present

## 2021-04-10 DIAGNOSIS — M25561 Pain in right knee: Secondary | ICD-10-CM | POA: Diagnosis present

## 2021-04-10 NOTE — Patient Instructions (Signed)
Access Code: [email or web address] URL: [email or web address] Date: 04/10/2021 Prepared by: Estill Bamberg April Thurnell Garbe  Exercises Supine Bridge - 1 x daily - 7 x weekly - 2 sets - 10 reps Clamshell with Resistance - 1 x daily - 7 x weekly - 2 sets - 10 reps Small Range Straight Leg Raise - 1 x daily - 7 x weekly - 2 sets - 10 reps Supine Hip Adduction Isometric with Ball - 1 x daily - 7 x weekly - 3 sets - 5 reps - 10 sec hold

## 2021-04-10 NOTE — Therapy (Signed)
[place], [place], [address] Phone: [phone]   Fax:  [phone]  Physical Therapy Evaluation  Patient Details  Name: [name] MRN: [id number] Date of Birth: August 18, 1961 Referring Provider (PT): Pete Pelt, [place]   Encounter Date: 04/10/2021   PT End of Session - 04/10/21 1421    Visit Number 1    Number of Visits 13    Date for PT Re-Evaluation 05/22/21    Authorization Type Medicare Part A & B; PN @ 10th visit, KX modifier if needed after 15 visits    Progress Note Due on Visit 10    PT Start Time 1420    PT Stop Time 1505    PT Time Calculation (min) 45 min    Activity Tolerance Patient tolerated treatment well    Behavior During Therapy [facility] for tasks assessed/performed           Past Medical History:  Diagnosis Date  . Anxiety   . Asthma   . Back pain   . Chronic pain   . DDD (degenerative disc disease), lumbar   . Degenerative disc disease   . Degenerative disc disease, cervical   . Degenerative disc disease, cervical   . Fibromyalgia   . Headache    regular  . Heartburn   . Hypertension   . Joint pain   . Localized swelling of both lower legs   . Osteoarthritis   . Other specified disorders of thyroid   . Rheumatoid arthritis ([place])   . Sleep apnea    mild, patient does not use mask  . SOB (shortness of breath)     Past Surgical History:  Procedure Laterality Date  . BACK SURGERY    . NOVASURE ABLATION    . TOTAL KNEE ARTHROPLASTY Left 01/17/2017   Procedure: LEFT TOTAL KNEE ARTHROPLASTY;  Surgeon: Mcarthur Rossetti, MD;  Location: [facility];  Service: Orthopedics;  Laterality: Left;  . TUBAL LIGATION      There were no vitals filed for this visit.    Subjective Assessment - 04/10/21 1423    Subjective Pt reports both knees are stiffening up so bad. Pt had a L TKA ~4 years ago. Pt reports walking has been slow and she has been losing balance. Pt gets a shot  in her R knee (~2 wks ago) which does help with pain. Pt reports L knee feels tight. R knee has been worsening since 4 years ago. Pt reports additional low back possibly from her knee pain.    Pertinent History L TKA, Pre-diabetes, Vitamin D, obesity, OA    Limitations Walking;Standing;House hold activities    How long can you sit comfortably? n/a    How long can you stand comfortably? 5 minutes at most (mostly limited due to back)    How long can you walk comfortably? ~5 minutes; mostly sits to perform ADLs    Patient Stated Goals Improve walking mechanics and walking further    Currently in Pain? No/denies    Pain Score 0-No pain   at worst 10/10 in R knee mostly after moving after long periods of sitting on laying down   Pain Location Knee    Pain Orientation Right;Left    Pain Descriptors / Indicators Throbbing;Discomfort;Aching;Sharp    Pain Type Chronic pain    Pain Onset More than a month ago    Pain Frequency Intermittent    Aggravating Factors  Changing between positions after prolonged sitting or  laying down    Pain Relieving Factors Rest/supine    Effect of Pain on Daily Activities Cleaning, cooking, grocery shopping              [facility] PT Assessment - 04/10/21 0001      Assessment   Medical Diagnosis Bilat knee pain and back    Referring Provider (PT) Pete Pelt, PA-C    Prior Therapy 4 years ago      Precautions   Precautions None      Restrictions   Weight Bearing Restrictions No      Balance Screen   Has the patient fallen in the past 6 months No      [place] residence    Living Arrangements Children    Available Help at Discharge Family    Type of [place] Access Level entry      Prior Function   Level of Independence Independent    Leisure Does not do anything; loves shopping and going to mall      Observation/Other Assessments   Focus on Therapeutic Outcomes (FOTO)  Risk adjusted 42; 36       ROM / Strength   AROM / PROM / Strength Strength;AROM      AROM   AROM Assessment Site Knee    Right/Left Knee Right;Left    Right Knee Extension 0    Right Knee Flexion 80   anterior knee pain   Left Knee Extension 0    Left Knee Flexion 100      Strength   Strength Assessment Site Knee;Hip    Right/Left Hip Right;Left    Right Hip Flexion 4/5    Right Hip Extension 3/5    Right Hip ABduction 3/5    Left Hip Flexion 4/5    Left Hip Extension 3-/5    Left Hip ABduction 3+/5    Right/Left Knee Right;Left    Right Knee Flexion 3+/5    Right Knee Extension 4-/5    Left Knee Flexion 4/5    Left Knee Extension 4-/5      Palpation   Patella mobility L patella moves freely; R patella hypomobile in medial direction    Palpation comment R patella sits laterally and higher than tibiofemoral joint      Ambulation/Gait   Ambulation Distance (Feet) 100 Feet    Assistive device None    Gait Pattern Step-through pattern;Abducted- right;Antalgic    Ambulation Surface Level;Indoor                      Objective measurements completed on examination: See above findings.               PT Education - 04/10/21 1528    Education Details Exam findings, POC, HEP    Person(s) Educated Patient    Methods Explanation;Demonstration;Verbal cues;Handout    Comprehension Verbalized understanding;Returned demonstration;Tactile cues required            PT Short Term Goals - 04/10/21 1515      PT SHORT TERM GOAL #1   Title She will be independent with inital HEP     Time 3    Period Weeks    Status New    Target Date 05/01/21      PT SHORT TERM GOAL #2   Title Pt will demo R knee flexion to at least 90 deg    Baseline 80 deg on eval  Time 3    Period Weeks    Status New    Target Date 05/01/21      PT SHORT TERM GOAL #3   Title Pt will demo L knee flexion to at least 105 deg    Baseline 100 deg on eval    Time 3    Period Weeks    Status New     Target Date 05/01/21             PT Long Term Goals - 04/10/21 1516      PT LONG TERM GOAL #1   Title She will be independnet with all HEP issued     Time 6    Period Weeks    Status New    Target Date 05/22/21      PT LONG TERM GOAL #2   Title Pt will be able to stand at least 10 minutes with minimal pain for improved ability to perform home tasks    Baseline Only tolerates 5 min    Time 6    Period Weeks    Status New    Target Date 05/22/21      PT LONG TERM GOAL #3   Title Pt will be able to walk at least 15 minutes    Time 6    Period Weeks    Status New    Target Date 05/22/21                  Plan - 04/10/21 1500    Clinical Impression Statement [name] is a 60 y/o F presenting to OPPT due to bilateral knee issues. Bilat knees feel stiff, R knee also painful. On assessment, pt demos increased R lateral and superior patellar positioning in comparison to her tibiofemoral joint. Pt demos gross and general hip and knee weakness with limited ROM. Pt presents with poor gait mechanics affecting her endurance and tolerance to walking activities at home and in the community.    Personal Factors and Comorbidities Age;Comorbidity 1    Comorbidities L TKA ~4 years ago    Examination-Activity Limitations Stairs;Bend;Locomotion Level;Transfers;Stand;Squat    Examination-Participation Restrictions Community Activity;Shop;Laundry    Stability/Clinical Decision Making Evolving/Moderate complexity    Clinical Decision Making Moderate    Rehab Potential Good    PT Frequency 2x / week    PT Duration 6 weeks    PT Treatment/Interventions ADLs/Self Care Home Management;Aquatic Therapy;Cryotherapy;Electrical Stimulation;Iontophoresis 4mg /ml Dexamethasone;Moist Heat;Ultrasound;Gait training;Stair training;Functional mobility training;Therapeutic activities;Therapeutic exercise;Balance training;Neuromuscular re-education;Manual techniques;Patient/family education;Orthotic  Fit/Training;Passive range of motion;Dry needling;Taping;Vasopneumatic Device    PT Next Visit Plan Assess response to HEP. Go over FOTO. Continue hip strengthening. Work on Statistician. Initiate knee ROM/stretching. Gait training.    PT Home Exercise Plan Access Code: [email or web address]    Consulted and Agree with Plan of Care Patient           Patient will benefit from skilled therapeutic intervention in order to improve the following deficits and impairments:  Abnormal gait,Decreased range of motion,Difficulty walking,Increased fascial restricitons,Decreased activity tolerance,Pain,Decreased mobility,Decreased strength,Postural dysfunction,Improper body mechanics  Visit Diagnosis: Chronic pain of right knee  Stiffness of right knee, not elsewhere classified  Stiffness of left knee, not elsewhere classified  Muscle weakness (generalized)  Other abnormalities of gait and mobility  Difficulty in walking, not elsewhere classified     Problem List Patient Active Problem List   Diagnosis Date Noted  . Vitamin D deficiency 02/21/2021  . Essential hypertension 02/21/2021  .  Pure hypercholesterolemia 02/21/2021  . Class 3 severe obesity with serious comorbidity and body mass index (BMI) of 50.0 to 59.9 in adult ([place]) 06/13/2020  . Prediabetes 06/13/2020  . Leg pain 06/13/2020  . Unilateral primary osteoarthritis, right knee 02/02/2020  . Status post total left knee replacement 01/17/2017  . Unilateral primary osteoarthritis, left knee 12/05/2016  . Degenerative disc disease, cervical     [name] April Ma L Maryfrances Portugal PT, DPT 04/10/2021, 3:29 PM  [facility] [address] [place], [place], [address] Phone: [phone]   Fax:  [phone]  Name: AMEL KITCH MRN: [id number] Date of Birth: [birth year]

## 2021-04-19 ENCOUNTER — Ambulatory Visit: Payer: Medicare Other

## 2021-04-21 ENCOUNTER — Ambulatory Visit: Payer: Medicare Other | Admitting: Rehabilitative and Restorative Service Providers"

## 2021-04-23 ENCOUNTER — Ambulatory Visit (INDEPENDENT_AMBULATORY_CARE_PROVIDER_SITE_OTHER): Payer: Medicare Other | Admitting: Bariatrics

## 2021-04-23 ENCOUNTER — Other Ambulatory Visit: Payer: Self-pay

## 2021-04-23 ENCOUNTER — Encounter (INDEPENDENT_AMBULATORY_CARE_PROVIDER_SITE_OTHER): Payer: Self-pay | Admitting: Bariatrics

## 2021-04-23 VITALS — BP 119/73 | HR 64 | Temp 97.8°F | Ht 67.0 in | Wt 324.0 lb

## 2021-04-23 DIAGNOSIS — Z6841 Body Mass Index (BMI) 40.0 and over, adult: Secondary | ICD-10-CM | POA: Diagnosis not present

## 2021-04-23 DIAGNOSIS — R7303 Prediabetes: Secondary | ICD-10-CM

## 2021-04-23 DIAGNOSIS — E559 Vitamin D deficiency, unspecified: Secondary | ICD-10-CM

## 2021-04-23 MED ORDER — VITAMIN D3 125 MCG (5000 UT) PO CAPS: 5000.0000 [IU] | ORAL_CAPSULE | Freq: Every day | ORAL | 0 refills | Status: AC

## 2021-04-24 NOTE — Progress Notes (Signed)
Chief Complaint:   OBESITY Trezure is here to discuss her progress with her obesity treatment plan along with follow-up of her obesity related diagnoses. Talayia is on the Category 2 Plan and states she is following her eating plan approximately 80-90% of the time. Shanitha states she is walking 10 minutes 5 times per week.  Today's visit was #: 12 Starting weight: 339 lbs Starting date: 06/12/2020 Today's weight: 324 lbs Today's date: 04/23/2021 Total lbs lost to date: 15 lbs Total lbs lost since last in-office visit: 1  Interim History: Tamico is down 1 additional lb. She is drinking adequate water.  Subjective:   1. Pre-diabetes Mendi is taking Ozempic. She denies side effects. No polyphagia,  2. Vitamin D deficiency Meggin denies nausea, vomiting, and muscle weakness. Pt is on Vit D 5,000 IU daily.  Assessment/Plan:   1. Pre-diabetes Graclynn will continue to work on weight loss, exercise, and decreasing simple carbohydrates to help decrease the risk of diabetes. Continue Ozempic. Will increase at next OV.  2. Vitamin D deficiency Low Vitamin D level contributes to fatigue and are associated with obesity, breast, and colon cancer. She agrees to continue to take Vitamin D @5 ,000 IU daily and will follow-up for routine testing of Vitamin D, at least 2-3 times per year to avoid over-replacement.  - Cholecalciferol (VITAMIN D3) 125 MCG (5000 UT) CAPS; Take 1 capsule (5,000 Units total) by mouth daily.  Dispense: 30 capsule; Refill: 0  3. Obesity, current BMI 75  Zury is currently in the action stage of change. As such, her goal is to continue with weight loss efforts. She has agreed to the Category 2 Plan.   Will journal  Exercise goals: As is  Behavioral modification strategies: increasing lean protein intake, decreasing simple carbohydrates, increasing vegetables, increasing water intake, decreasing eating out, no skipping meals, meal planning and cooking  strategies, keeping healthy foods in the home and planning for success.  Israel has agreed to follow-up with our clinic in 2-3 weeks. She was informed of the importance of frequent follow-up visits to maximize her success with intensive lifestyle modifications for her multiple health conditions.   Objective:   Blood pressure 119/73, pulse 64, temperature 97.8 F (36.6 C), height 5\' 7"  (1.702 m), weight (!) 324 lb (147 kg), last menstrual period 04/20/2011, SpO2 98 %. Body mass index is 50.75 kg/m.  General: Cooperative, alert, well developed, in no acute distress. HEENT: Conjunctivae and lids unremarkable. Cardiovascular: Regular rhythm.  Lungs: Normal work of breathing. Neurologic: No focal deficits.   Lab Results  Component Value Date   CREATININE 0.92 02/21/2021   BUN 14 02/21/2021   NA 140 02/21/2021   K 3.9 02/21/2021   CL 98 02/21/2021   CO2 24 02/21/2021   Lab Results  Component Value Date   ALT 17 02/21/2021   AST 18 02/21/2021   ALKPHOS 88 02/21/2021   BILITOT 0.5 02/21/2021   Lab Results  Component Value Date   HGBA1C 5.9 (H) 02/21/2021   HGBA1C 5.8 (H) 06/12/2020   Lab Results  Component Value Date   INSULIN 10.5 02/21/2021   INSULIN 12.8 06/12/2020   Lab Results  Component Value Date   TSH 1.120 06/12/2020   Lab Results  Component Value Date   CHOL 201 (H) 02/21/2021   HDL 55 02/21/2021   LDLCALC 129 (H) 02/21/2021   TRIG 92 02/21/2021   CHOLHDL 3.7 02/21/2021   Lab Results  Component Value Date   WBC 7.2  07/01/2018   HGB 14.0 07/01/2018   HCT 40.9 07/01/2018   MCV 91.3 07/01/2018   PLT 406 (H) 07/01/2018   Lab Results  Component Value Date   IRON 93 04/13/2010   TIBC 356 04/13/2010    Obesity Behavioral Intervention:   Approximately 15 minutes were spent on the discussion below.  ASK: We discussed the diagnosis of obesity with [name] today and Ayzia agreed to give [name] permission to discuss obesity behavioral modification  therapy today.  ASSESS: Shallon has the diagnosis of obesity and her BMI today is 50.8. Jaleisa is in the action stage of change.   ADVISE: Lirio was educated on the multiple health risks of obesity as well as the benefit of weight loss to improve her health. She was advised of the need for long term treatment and the importance of lifestyle modifications to improve her current health and to decrease her risk of future health problems.  AGREE: Multiple dietary modification options and treatment options were discussed and Yeraldin agreed to follow the recommendations documented in the above note.  ARRANGE: Omeka was educated on the importance of frequent visits to treat obesity as outlined per CMS and USPSTF guidelines and agreed to schedule her next follow up appointment today.  Attestation Statements:   Reviewed by clinician on day of visit: allergies, medications, problem list, medical history, surgical history, family history, social history, and previous encounter notes.  Coral Ceo, CMA, am acting as Location manager for [employer], DO.  I have reviewed the above documentation for accuracy and completeness, and I agree with the above. Jearld Lesch, DO

## 2021-04-25 ENCOUNTER — Encounter (INDEPENDENT_AMBULATORY_CARE_PROVIDER_SITE_OTHER): Payer: Self-pay | Admitting: Bariatrics

## 2021-04-25 ENCOUNTER — Encounter: Payer: Medicare Other | Admitting: Rehabilitative and Restorative Service Providers"

## 2021-04-28 ENCOUNTER — Other Ambulatory Visit (INDEPENDENT_AMBULATORY_CARE_PROVIDER_SITE_OTHER): Payer: Self-pay | Admitting: Physician Assistant

## 2021-04-28 DIAGNOSIS — R7303 Prediabetes: Secondary | ICD-10-CM

## 2021-04-30 ENCOUNTER — Ambulatory Visit: Payer: Medicare Other | Admitting: Physical Therapy

## 2021-04-30 NOTE — Telephone Encounter (Signed)
Call pt. Is she doing ok with the Ozempic? Side effects? Does she want to go up to the next dose.

## 2021-04-30 NOTE — Telephone Encounter (Signed)
Refill request

## 2021-04-30 NOTE — Telephone Encounter (Signed)
Pt said no to increase, okay with refill

## 2021-04-30 NOTE — Telephone Encounter (Signed)
Pt last seen by Dr. Brown.  

## 2021-05-02 ENCOUNTER — Ambulatory Visit: Payer: Medicare Other | Attending: Physician Assistant | Admitting: Physical Therapy

## 2021-05-02 ENCOUNTER — Other Ambulatory Visit: Payer: Self-pay

## 2021-05-02 DIAGNOSIS — M25662 Stiffness of left knee, not elsewhere classified: Secondary | ICD-10-CM | POA: Diagnosis present

## 2021-05-02 DIAGNOSIS — M25661 Stiffness of right knee, not elsewhere classified: Secondary | ICD-10-CM | POA: Diagnosis present

## 2021-05-02 DIAGNOSIS — M6281 Muscle weakness (generalized): Secondary | ICD-10-CM | POA: Diagnosis present

## 2021-05-02 DIAGNOSIS — G8929 Other chronic pain: Secondary | ICD-10-CM | POA: Insufficient documentation

## 2021-05-02 DIAGNOSIS — M25561 Pain in right knee: Secondary | ICD-10-CM | POA: Diagnosis present

## 2021-05-02 DIAGNOSIS — R262 Difficulty in walking, not elsewhere classified: Secondary | ICD-10-CM | POA: Insufficient documentation

## 2021-05-02 DIAGNOSIS — R2689 Other abnormalities of gait and mobility: Secondary | ICD-10-CM | POA: Diagnosis present

## 2021-05-02 NOTE — Patient Instructions (Signed)
Access Code: [email or web address] URL: [email or web address] Date: 05/02/2021 Prepared by: Estill Bamberg April Thurnell Garbe  Exercises Clamshell with Resistance - 1 x daily - 7 x weekly - 2 sets - 10 reps Small Range Straight Leg Raise - 1 x daily - 7 x weekly - 2 sets - 10 reps Supine Bridge with Mini Swiss Ball Between Knees - 1 x daily - 7 x weekly - 2 sets - 10 reps Gastroc Stretch on Wall - 1 x daily - 7 x weekly - 2 sets - 20-30 sec hold Prone Quadriceps Stretch with Strap - 1 x daily - 7 x weekly - 2 sets - 20-30 sec hold

## 2021-05-02 NOTE — Therapy (Addendum)
[place], [place], [address] Phone: [phone]   Fax:  [phone]  Physical Therapy Treatment and Discharge  Patient Details  Name: [name] MRN: [id number] Date of Birth: November 06, 1961 Referring Provider (PT): Pete Pelt, PA-C   PHYSICAL THERAPY DISCHARGE SUMMARY  Visits from Start of Care: 2  Current functional level related to goals / functional outcomes: See below. Pt with difficulty coming into clinic with multiple cancels and no-shows. Pt reports needing a different time; discussed for her to reschedule with front desk.    Remaining deficits: Pt with improving knee ROM but has not met goals.    Education / Equipment: See below  Plan:                                                    Patient goals were not met. Patient is being discharged due to not returning since the last visit.  ?????       Encounter Date: 05/02/2021   PT End of Session - 05/02/21 1636    Visit Number 2    Number of Visits 13    Date for PT Re-Evaluation 05/22/21    Authorization Type Medicare Part A & B; PN @ 10th visit, KX modifier if needed after 15 visits    Progress Note Due on Visit 10    PT Start Time 1635    PT Stop Time 1715    PT Time Calculation (min) 40 min    Activity Tolerance Patient tolerated treatment well    Behavior During Therapy WFL for tasks assessed/performed           Past Medical History:  Diagnosis Date  . Anxiety   . Asthma   . Back pain   . Chronic pain   . DDD (degenerative disc disease), lumbar   . Degenerative disc disease   . Degenerative disc disease, cervical   . Degenerative disc disease, cervical   . Fibromyalgia   . Headache    regular  . Heartburn   . Hypertension   . Joint pain   . Localized swelling of both lower legs   . Osteoarthritis   . Other specified disorders of thyroid   . Rheumatoid arthritis ([place])   . Sleep apnea    mild, patient does  not use mask  . SOB (shortness of breath)     Past Surgical History:  Procedure Laterality Date  . BACK SURGERY    . NOVASURE ABLATION    . TOTAL KNEE ARTHROPLASTY Left 01/17/2017   Procedure: LEFT TOTAL KNEE ARTHROPLASTY;  Surgeon: Mcarthur Rossetti, MD;  Location: [facility];  Service: Orthopedics;  Laterality: Left;  . TUBAL LIGATION      There were no vitals filed for this visit.   Subjective Assessment - 05/02/21 1635    Subjective Pt states her asthma has been flaring up making it difficult to come to the clinic. Pt reports she's been doing the exercises. Reports no issues with the exercises.    Pertinent History L TKA, Pre-diabetes, Vitamin D, obesity, OA    Limitations Walking;Standing;House hold activities    How long can you sit comfortably? n/a    How long can you stand comfortably? 5 minutes at most (mostly limited due to back)    How long can you  walk comfortably? ~5 minutes; mostly sits to perform ADLs    Patient Stated Goals Improve walking mechanics and walking further    Currently in Pain? No/denies    Pain Onset More than a month ago                             [facility] Adult PT Treatment/Exercise - 05/02/21 0001      Exercises   Exercises Knee/Hip      Knee/Hip Exercises: Stretches   Passive Hamstring Stretch Right;Left;20 seconds    Quad Stretch Right;Left;20 seconds    Knee: Self-Stretch to increase Flexion Right;Left;30 seconds    Gastroc Stretch Right;Left;30 seconds    Soleus Stretch Right;Left;30 seconds      Knee/Hip Exercises: Aerobic   Nustep L4 x6 min UEs/LEs      Knee/Hip Exercises: Supine   Quad Sets Both;10 reps    Bridges 2 sets;10 reps    Straight Leg Raises Strengthening;Both;2 sets;10 reps   short range   Knee Extension AROM;2 sets   knee flexion/extension     Knee/Hip Exercises: Sidelying   Clams 2x10 green tband      Manual Therapy   Manual Therapy Joint mobilization    Joint Mobilization Grade II tibiofemoral  PA mobs                    PT Short Term Goals - 05/02/21 1734      PT SHORT TERM GOAL #1   Title She will be independent with inital HEP     Time 3    Period Weeks    Status Achieved    Target Date 05/01/21      PT SHORT TERM GOAL #2   Title Pt will demo R knee flexion to at least 90 deg    Baseline 80 deg on eval    Time 3    Period Weeks    Status Achieved    Target Date 05/01/21      PT SHORT TERM GOAL #3   Title Pt will demo L knee flexion to at least 105 deg    Baseline 100 deg on eval    Time 3    Period Weeks    Status On-going    Target Date 05/01/21             PT Long Term Goals - 04/10/21 1516      PT LONG TERM GOAL #1   Title She will be independnet with all HEP issued     Time 6    Period Weeks    Status New    Target Date 05/22/21      PT LONG TERM GOAL #2   Title Pt will be able to stand at least 10 minutes with minimal pain for improved ability to perform home tasks    Baseline Only tolerates 5 min    Time 6    Period Weeks    Status New    Target Date 05/22/21      PT LONG TERM GOAL #3   Title Pt will be able to walk at least 15 minutes    Time 6    Period Weeks    Status New    Target Date 05/22/21                 Plan - 05/02/21 1729    Clinical Impression Statement Treatment focused on updating pt's HEP and  reviewing as needed. Continued to work on improving pt's quad activation bilat (L worse than R). Increased time spent with manual and stretching for her bilat knees due to pt reporting stiffness. Worked on improving gait pattern. Increased bilat knee ROM since eval in supine. Pt would benefit from additional therapy to reach pt's goals.    Personal Factors and Comorbidities Age;Comorbidity 1    Comorbidities L TKA ~4 years ago    Examination-Activity Limitations Stairs;Bend;Locomotion Level;Transfers;Stand;Squat    Examination-Participation Restrictions Community Activity;Shop;Laundry    Stability/Clinical  Decision Making Evolving/Moderate complexity    Rehab Potential Good    PT Frequency 2x / week    PT Duration 6 weeks    PT Treatment/Interventions ADLs/Self Care Home Management;Aquatic Therapy;Cryotherapy;Electrical Stimulation;Iontophoresis 67m/ml Dexamethasone;Moist Heat;Ultrasound;Gait training;Stair training;Functional mobility training;Therapeutic activities;Therapeutic exercise;Balance training;Neuromuscular re-education;Manual techniques;Patient/family education;Orthotic Fit/Training;Passive range of motion;Dry needling;Taping;Vasopneumatic Device    PT Next Visit Plan Assess response to HEP. Continue hip strengthening. Work on qIT consultant Continue knee ROM/stretching. Gait training.    PT Home Exercise Plan Access Code: [email or web address]   Consulted and Agree with Plan of Care Patient           Patient will benefit from skilled therapeutic intervention in order to improve the following deficits and impairments:  Abnormal gait,Decreased range of motion,Difficulty walking,Increased fascial restricitons,Decreased activity tolerance,Pain,Decreased mobility,Decreased strength,Postural dysfunction,Improper body mechanics  Visit Diagnosis: Chronic pain of right knee  Stiffness of right knee, not elsewhere classified  Stiffness of left knee, not elsewhere classified  Muscle weakness (generalized)  Other abnormalities of gait and mobility  Difficulty in walking, not elsewhere classified     Problem List Patient Active Problem List   Diagnosis Date Noted  . Vitamin D deficiency 02/21/2021  . Essential hypertension 02/21/2021  . Pure hypercholesterolemia 02/21/2021  . Class 3 severe obesity with serious comorbidity and body mass index (BMI) of 50.0 to 59.9 in adult ([place] 06/13/2020  . Prediabetes 06/13/2020  . Leg pain 06/13/2020  . Unilateral primary osteoarthritis, right knee 02/02/2020  . Status post total left knee replacement 01/17/2017  . Unilateral primary  osteoarthritis, left knee 12/05/2016  . Degenerative disc disease, cervical     Jolyne Laye April Ma L Jakarie Pember PT, DPT 05/02/2021, 5:35 PM  [facility] [place] [address]Phone: [phone]  Fax:  [phone] Name: Danielle MEEMRN: [id number] of Birth: 810/19/1962

## 2021-05-07 ENCOUNTER — Telehealth: Payer: Self-pay | Admitting: Physical Therapy

## 2021-05-07 ENCOUNTER — Ambulatory Visit: Payer: Medicare Other | Admitting: Physical Therapy

## 2021-05-07 NOTE — Telephone Encounter (Signed)
Called pt in regards to missed PT visit on 05/07/21 at 4:30pm. Pt did not answer. Left voicemail stating that due to multiple cancels and no-shows, PT is supposed to discharge her based on our attendance policy.  Informed pt to get a new doctor referral and to call clinic to reschedule if she would like to resume PT.   [place] Jobe April Gordy Levan, PT, DPT

## 2021-05-12 ENCOUNTER — Encounter: Payer: Medicare Other | Admitting: Rehabilitative and Restorative Service Providers"

## 2021-05-15 ENCOUNTER — Ambulatory Visit (INDEPENDENT_AMBULATORY_CARE_PROVIDER_SITE_OTHER): Payer: Medicare Other | Admitting: Bariatrics

## 2021-05-15 ENCOUNTER — Encounter: Payer: Medicare Other | Admitting: Physical Therapy

## 2021-05-17 ENCOUNTER — Encounter: Payer: Medicare Other | Admitting: Physical Therapy

## 2021-05-21 ENCOUNTER — Encounter: Payer: Medicare Other | Admitting: Physical Therapy

## 2021-05-24 ENCOUNTER — Other Ambulatory Visit (INDEPENDENT_AMBULATORY_CARE_PROVIDER_SITE_OTHER): Payer: Self-pay | Admitting: Bariatrics

## 2021-05-24 ENCOUNTER — Encounter: Payer: Medicare Other | Admitting: Physical Therapy

## 2021-05-24 DIAGNOSIS — E559 Vitamin D deficiency, unspecified: Secondary | ICD-10-CM

## 2021-05-24 NOTE — Telephone Encounter (Signed)
Last seen by Dr. Brown. 

## 2021-06-04 ENCOUNTER — Ambulatory Visit (INDEPENDENT_AMBULATORY_CARE_PROVIDER_SITE_OTHER): Payer: Medicare Other | Admitting: Bariatrics

## 2021-06-06 ENCOUNTER — Ambulatory Visit: Payer: Medicare Other | Admitting: Physician Assistant

## 2021-06-11 ENCOUNTER — Ambulatory Visit: Payer: Medicare Other | Admitting: Physician Assistant

## 2021-06-13 ENCOUNTER — Other Ambulatory Visit (INDEPENDENT_AMBULATORY_CARE_PROVIDER_SITE_OTHER): Payer: Self-pay | Admitting: Bariatrics

## 2021-06-13 DIAGNOSIS — E559 Vitamin D deficiency, unspecified: Secondary | ICD-10-CM

## 2021-06-13 NOTE — Telephone Encounter (Signed)
Dr.Brown 

## 2021-06-20 ENCOUNTER — Ambulatory Visit (INDEPENDENT_AMBULATORY_CARE_PROVIDER_SITE_OTHER): Payer: Medicare Other | Admitting: Family Medicine

## 2021-06-24 ENCOUNTER — Other Ambulatory Visit (INDEPENDENT_AMBULATORY_CARE_PROVIDER_SITE_OTHER): Payer: Self-pay | Admitting: Bariatrics

## 2021-06-24 DIAGNOSIS — E559 Vitamin D deficiency, unspecified: Secondary | ICD-10-CM

## 2021-06-25 NOTE — Telephone Encounter (Signed)
Pt last seen by Dr. Brown.  

## 2021-06-26 ENCOUNTER — Other Ambulatory Visit (INDEPENDENT_AMBULATORY_CARE_PROVIDER_SITE_OTHER): Payer: Self-pay | Admitting: Bariatrics

## 2021-06-26 DIAGNOSIS — E559 Vitamin D deficiency, unspecified: Secondary | ICD-10-CM

## 2021-07-18 ENCOUNTER — Ambulatory Visit: Payer: Medicare Other | Admitting: Physician Assistant

## 2021-07-24 ENCOUNTER — Other Ambulatory Visit: Payer: Self-pay | Admitting: Internal Medicine

## 2021-07-24 DIAGNOSIS — Z1231 Encounter for screening mammogram for malignant neoplasm of breast: Secondary | ICD-10-CM

## 2021-08-07 ENCOUNTER — Ambulatory Visit: Payer: Medicare Other | Admitting: Family Medicine

## 2021-08-09 ENCOUNTER — Ambulatory Visit: Payer: Medicare Other

## 2021-08-21 ENCOUNTER — Encounter: Payer: Self-pay | Admitting: Gastroenterology

## 2021-09-13 ENCOUNTER — Ambulatory Visit: Payer: Medicare Other | Admitting: Obstetrics and Gynecology

## 2021-09-24 ENCOUNTER — Ambulatory Visit: Payer: Medicare Other | Admitting: Physician Assistant

## 2021-10-22 ENCOUNTER — Ambulatory Visit (INDEPENDENT_AMBULATORY_CARE_PROVIDER_SITE_OTHER): Payer: Medicare Other | Admitting: Orthopaedic Surgery

## 2021-10-22 DIAGNOSIS — G8929 Other chronic pain: Secondary | ICD-10-CM | POA: Diagnosis not present

## 2021-10-22 DIAGNOSIS — M1711 Unilateral primary osteoarthritis, right knee: Secondary | ICD-10-CM

## 2021-10-22 DIAGNOSIS — M25561 Pain in right knee: Secondary | ICD-10-CM | POA: Diagnosis not present

## 2021-10-22 MED ORDER — METHYLPREDNISOLONE ACETATE 40 MG/ML IJ SUSP
40.0000 mg | INTRAMUSCULAR | Status: AC | PRN
  Administered 2021-10-22: 40 mg via INTRA_ARTICULAR

## 2021-10-22 MED ORDER — LIDOCAINE HCL 1 % IJ SOLN
3.0000 mL | INTRAMUSCULAR | Status: AC | PRN
  Administered 2021-10-22: 3 mL

## 2021-10-22 NOTE — Progress Notes (Signed)
Office Visit Note   Patient: [name]           Date of Birth: Aug 24, 1961           MRN: [id number] Visit Date: 10/22/2021              Requested by: Nolene Ebbs, MD [address] [place],  [place] [address] PCP: Nolene Ebbs, MD   Assessment & Plan: Visit Diagnoses:  1. Chronic pain of right knee   2. Primary osteoarthritis of right knee     Plan: Per her request I did place a steroid injection in her right knee which she tolerated well.  She knows to wait at least 3 to 4 months between injections.  Advocated weight loss again for her.  I did give her a prescription to take to [facility] to see if they can fit her for just a standard knee brace per her request.  Follow-Up Instructions: Return if symptoms worsen or fail to improve.   Orders:  Orders Placed This Encounter  Procedures   Large Joint Inj   No orders of the defined types were placed in this encounter.     Procedures: Large Joint Inj: R knee on 10/22/2021 2:17 PM Indications: diagnostic evaluation and pain Details: 22 G 1.5 in needle, superolateral approach  Arthrogram: No  Medications: 3 mL lidocaine 1 %; 40 mg methylPREDNISolone acetate 40 MG/ML Outcome: tolerated well, no immediate complications Procedure, treatment alternatives, risks and benefits explained, specific risks discussed. Consent was given by the patient. Immediately prior to procedure a time out was called to verify the correct patient, procedure, equipment, support staff and site/side marked as required. Patient was prepped and draped in the usual sterile fashion.      Clinical Data: No additional findings.   Subjective: Chief Complaint  Patient presents with   Right Knee - Pain  The patient comes in today requesting a steroid injection in her right knee.  She has well-documented arthritis of the right knee.  It has been several months since her last injection.  She did needed earlier but she had a death in the family and has  taken a lot out of her.  She is someone who is morbidly obese with a BMI of over 50.  She is had no other acute change in her medical status.  She deals with chronic back pain as well.  She is a diabetic but reports good glucose control  HPI  Review of Systems There is listed no headache, chest pain, shortness of breath, fever, chills, nausea, vomiting  Objective: Vital Signs: LMP 04/20/2011   Physical Exam She is alert and orient x3 and in no acute distress Ortho Exam Examination right knee showed moves well with no effusion but has global tenderness. Specialty Comments:  No specialty comments available.  Imaging: No results found.   PMFS History: Patient Active Problem List   Diagnosis Date Noted   Vitamin D deficiency 02/21/2021   Essential hypertension 02/21/2021   Pure hypercholesterolemia 02/21/2021   Class 3 severe obesity with serious comorbidity and body mass index (BMI) of 50.0 to 59.9 in adult ([place]) 06/13/2020   Prediabetes 06/13/2020   Leg pain 06/13/2020   Unilateral primary osteoarthritis, right knee 02/02/2020   Status post total left knee replacement 01/17/2017   Unilateral primary osteoarthritis, left knee 12/05/2016   Degenerative disc disease, cervical    Past Medical History:  Diagnosis Date   Anxiety    Asthma    Back  pain    Chronic pain    DDD (degenerative disc disease), lumbar    Degenerative disc disease    Degenerative disc disease, cervical    Degenerative disc disease, cervical    Fibromyalgia    Headache    regular   Heartburn    Hypertension    Joint pain    Localized swelling of both lower legs    Osteoarthritis    Other specified disorders of thyroid    Rheumatoid arthritis ([facility])    Sleep apnea    mild, patient does not use mask   SOB (shortness of breath)     Family History  Problem Relation Age of Onset   Cancer Mother    Hypertension Mother    Eating disorder Mother    Obesity Mother    Diabetes Father    Heart  disease Father    High Cholesterol Father    Kidney disease Father    Alcoholism Father    Eating disorder Father    Cancer Sister     Past Surgical History:  Procedure Laterality Date   BACK SURGERY     NOVASURE ABLATION     TOTAL KNEE ARTHROPLASTY Left 01/17/2017   Procedure: LEFT TOTAL KNEE ARTHROPLASTY;  Surgeon: Mcarthur Rossetti, MD;  Location: [facility];  Service: Orthopedics;  Laterality: Left;   TUBAL LIGATION     Social History   Occupational History   Occupation: n/a  Tobacco Use   Smoking status: Never   Smokeless tobacco: Never  Vaping Use   Vaping Use: Never used  Substance and Sexual Activity   Alcohol use: Not Currently   Drug use: No   Sexual activity: Yes    Birth control/protection: Surgical

## 2021-11-11 ENCOUNTER — Emergency Department (HOSPITAL_COMMUNITY): Payer: Medicare Other

## 2021-11-11 ENCOUNTER — Emergency Department (HOSPITAL_COMMUNITY)
Admission: EM | Admit: 2021-11-11 | Discharge: 2021-11-12 | Disposition: A | Discharge status: Home / Self Care | Payer: Medicare Other | Attending: Emergency Medicine | Admitting: Emergency Medicine

## 2021-11-11 ENCOUNTER — Encounter (HOSPITAL_COMMUNITY): Payer: Self-pay | Admitting: *Deleted

## 2021-11-11 ENCOUNTER — Other Ambulatory Visit: Payer: Self-pay

## 2021-11-11 DIAGNOSIS — R0602 Shortness of breath: Secondary | ICD-10-CM | POA: Insufficient documentation

## 2021-11-11 DIAGNOSIS — Z794 Long term (current) use of insulin: Secondary | ICD-10-CM | POA: Insufficient documentation

## 2021-11-11 DIAGNOSIS — E119 Type 2 diabetes mellitus without complications: Secondary | ICD-10-CM | POA: Diagnosis not present

## 2021-11-11 DIAGNOSIS — J45909 Unspecified asthma, uncomplicated: Secondary | ICD-10-CM | POA: Diagnosis not present

## 2021-11-11 DIAGNOSIS — I10 Essential (primary) hypertension: Secondary | ICD-10-CM | POA: Insufficient documentation

## 2021-11-11 DIAGNOSIS — Z96652 Presence of left artificial knee joint: Secondary | ICD-10-CM | POA: Diagnosis not present

## 2021-11-11 DIAGNOSIS — R059 Cough, unspecified: Secondary | ICD-10-CM | POA: Diagnosis not present

## 2021-11-11 DIAGNOSIS — Z79899 Other long term (current) drug therapy: Secondary | ICD-10-CM | POA: Diagnosis not present

## 2021-11-11 DIAGNOSIS — Z7951 Long term (current) use of inhaled steroids: Secondary | ICD-10-CM | POA: Insufficient documentation

## 2021-11-11 DIAGNOSIS — E876 Hypokalemia: Secondary | ICD-10-CM | POA: Diagnosis not present

## 2021-11-11 LAB — CBC WITH DIFFERENTIAL/PLATELET
Abs Immature Granulocytes: 0.02 10*3/uL (ref 0.00–0.07)
Basophils Absolute: 0 10*3/uL (ref 0.0–0.1)
Basophils Relative: 0 %
Eosinophils Absolute: 0.1 10*3/uL (ref 0.0–0.5)
Eosinophils Relative: 1 %
HCT: 37.1 % (ref 36.0–46.0)
Hemoglobin: 12.8 g/dL (ref 12.0–15.0)
Immature Granulocytes: 0 %
Lymphocytes Relative: 30 %
Lymphs Abs: 2 10*3/uL (ref 0.7–4.0)
MCH: 31.4 pg (ref 26.0–34.0)
MCHC: 34.5 g/dL (ref 30.0–36.0)
MCV: 91.2 fL (ref 80.0–100.0)
Monocytes Absolute: 0.5 10*3/uL (ref 0.1–1.0)
Monocytes Relative: 8 %
Neutro Abs: 3.9 10*3/uL (ref 1.7–7.7)
Neutrophils Relative %: 61 %
Platelets: 379 10*3/uL (ref 150–400)
RBC: 4.07 MIL/uL (ref 3.87–5.11)
RDW: 13.6 % (ref 11.5–15.5)
WBC: 6.5 10*3/uL (ref 4.0–10.5)
nRBC: 0 % (ref 0.0–0.2)

## 2021-11-11 LAB — BASIC METABOLIC PANEL
Anion gap: 5 (ref 5–15)
BUN: 15 mg/dL (ref 8–23)
CO2: 28 mmol/L (ref 22–32)
Calcium: 9 mg/dL (ref 8.9–10.3)
Chloride: 106 mmol/L (ref 98–111)
Creatinine, Ser: 0.8 mg/dL (ref 0.44–1.00)
GFR, Estimated: >60 mL/min (ref >60)
Glucose, Bld: 111 mg/dL — ABNORMAL HIGH (ref 70–99)
Potassium: 2.8 mmol/L — ABNORMAL LOW (ref 3.5–5.1)
Sodium: 139 mmol/L (ref 135–145)

## 2021-11-11 LAB — D-DIMER, QUANTITATIVE: D-Dimer, Quant: 0.53 ug/mL-FEU — ABNORMAL HIGH (ref 0.00–0.50)

## 2021-11-11 MED ORDER — ALBUTEROL SULFATE (2.5 MG/3ML) 0.083% IN NEBU
5.0000 mg | INHALATION_SOLUTION | Freq: Once | RESPIRATORY_TRACT | Status: AC
  Administered 2021-11-11: 23:00:00 5 mg via RESPIRATORY_TRACT
  Filled 2021-11-11: qty 6

## 2021-11-11 MED ORDER — POTASSIUM CHLORIDE CRYS ER 20 MEQ PO TBCR
40.0000 meq | EXTENDED_RELEASE_TABLET | Freq: Once | ORAL | Status: AC
  Administered 2021-11-11: 40 meq via ORAL
  Filled 2021-11-11: qty 2

## 2021-11-11 MED ORDER — ALBUTEROL SULFATE HFA 108 (90 BASE) MCG/ACT IN AERS
2.0000 | INHALATION_SPRAY | RESPIRATORY_TRACT | Status: DC | PRN
  Administered 2021-11-11: 2 via RESPIRATORY_TRACT
  Filled 2021-11-11: qty 6.7

## 2021-11-11 NOTE — ED Notes (Signed)
Covid swab not obtained as pt refused the nasopharyngeal swab

## 2021-11-11 NOTE — ED Notes (Signed)
Spo2 100%  on RA while ambulating

## 2021-11-11 NOTE — ED Triage Notes (Signed)
Pt is here from home with sob which began today.  No CP at this time.  Pt has prn lasix at home but has not taken any

## 2021-11-11 NOTE — ED Provider Notes (Signed)
[facility] DEPT Provider Note   CSN: [id number] Arrival date & time: 11/11/21  2101     History Chief Complaint  Patient presents with   Shortness of Breath    [name] is a 60 y.o. female.  Patient is a 60 year old female with a history of obesity, prediabetes, chronic pain and asthma who presents with shortness of breath.  She says it just started earlier today.  She feels a little tight like her normal asthma but no other chest pain.  No cough or congestion other than a dry cough that she has had and associated with her shortness of breath today.  She has been using her inhaler without improvement in symptoms.  No leg swelling.  No fevers.  No nasal congestion.  No pleuritic pain.      Past Medical History:  Diagnosis Date   Anxiety    Asthma    Back pain    Chronic pain    DDD (degenerative disc disease), lumbar    Degenerative disc disease    Degenerative disc disease, cervical    Degenerative disc disease, cervical    Fibromyalgia    Headache    regular   Heartburn    Hypertension    Joint pain    Localized swelling of both lower legs    Osteoarthritis    Other specified disorders of thyroid    Rheumatoid arthritis ([place])    Sleep apnea    mild, patient does not use mask   SOB (shortness of breath)     Patient Active Problem List   Diagnosis Date Noted   Vitamin D deficiency 02/21/2021   Essential hypertension 02/21/2021   Pure hypercholesterolemia 02/21/2021   Class 3 severe obesity with serious comorbidity and body mass index (BMI) of 50.0 to 59.9 in adult ([place]) 06/13/2020   Prediabetes 06/13/2020   Leg pain 06/13/2020   Unilateral primary osteoarthritis, right knee 02/02/2020   Status post total left knee replacement 01/17/2017   Unilateral primary osteoarthritis, left knee 12/05/2016   Degenerative disc disease, cervical     Past Surgical History:  Procedure Laterality Date   BACK SURGERY     NOVASURE  ABLATION     TOTAL KNEE ARTHROPLASTY Left 01/17/2017   Procedure: LEFT TOTAL KNEE ARTHROPLASTY;  Surgeon: Mcarthur Rossetti, MD;  Location: [facility];  Service: Orthopedics;  Laterality: Left;   TUBAL LIGATION       OB History     Gravida  2   Para  2   Term      Preterm      AB      Living         SAB      IAB      Ectopic      Multiple      Live Births              Family History  Problem Relation Age of Onset   Cancer Mother    Hypertension Mother    Eating disorder Mother    Obesity Mother    Diabetes Father    Heart disease Father    High Cholesterol Father    Kidney disease Father    Alcoholism Father    Eating disorder Father    Cancer Sister     Social History   Tobacco Use   Smoking status: Never   Smokeless tobacco: Never  Vaping Use   Vaping Use: Never used  Substance  Use Topics   Alcohol use: Not Currently   Drug use: No    Home Medications Prior to Admission medications   Medication Sig Start Date End Date Taking? Authorizing Provider  albuterol (PROVENTIL HFA;VENTOLIN HFA) 108 (90 BASE) MCG/ACT inhaler Inhale 2 puffs into the lungs every 6 (six) hours as needed for shortness of breath.     [provider]  albuterol (PROVENTIL) (2.5 MG/3ML) 0.083% nebulizer solution Take 2.5 mg by nebulization every 6 (six) hours as needed for wheezing or shortness of breath.    [provider]  atenolol (TENORMIN) 50 MG tablet Take 50 mg by mouth daily. 03/14/21   [provider]  cetirizine (ZYRTEC) 10 MG tablet Take 10 mg by mouth daily as needed for allergies.    [provider]  Cholecalciferol (VITAMIN D3) 125 MCG (5000 UT) CAPS Take 1 capsule (5,000 Units total) by mouth daily. 04/23/21   Jearld Lesch A, DO  cyclobenzaprine (FLEXERIL) 10 MG tablet Take 10 mg by mouth 3 (three) times daily as needed for muscle spasms.    [provider]  escitalopram (LEXAPRO) 10 MG tablet SMARTSIG:1 Tablet(s) By  Mouth Every Evening 07/20/20   [provider]  FLOVENT HFA 220 MCG/ACT inhaler USE 2 PUFFS TWICE A DAY 03/10/17   [provider]  furosemide (LASIX) 20 MG tablet Take 20 mg by mouth daily as needed for fluid or edema.    [provider]  ibuprofen (ADVIL,MOTRIN) 800 MG tablet Take 800 mg by mouth 3 (three) times daily as needed for moderate pain.  08/28/15   [provider]  Insulin Pen Needle (BD PEN NEEDLE NANO 2ND GEN) 32G X 4 MM MISC 1 Package by Does not apply route 2 (two) times daily. 01/09/21   Abby Potash, PA-C  NOREL AD 4-10-325 MG TABS Take 1 tablet by mouth 2 (two) times daily as needed. 09/01/20   [provider]  Oxycodone HCl 20 MG TABS TAKE 1 TABLET BY MOUTH EVERY 4-6 HOURS (MAX OF 5 PER DAY) 03/10/17   [provider]  OZEMPIC, 0.25 OR 0.5 MG/DOSE, 2 MG/1.5ML SOPN INJECT 0.25MG  INTO THE SKIN ONE TIME PER WEEK 04/30/21   Jearld Lesch A, DO    Allergies    Patient has no known allergies.  Review of Systems   Review of Systems  Constitutional:  Negative for chills, diaphoresis, fatigue and fever.  HENT:  Negative for congestion, rhinorrhea and sneezing.   Eyes: Negative.   Respiratory:  Positive for cough and shortness of breath. Negative for chest tightness and wheezing.   Cardiovascular:  Negative for chest pain and leg swelling.  Gastrointestinal:  Negative for abdominal pain, blood in stool, diarrhea, nausea and vomiting.  Genitourinary:  Negative for difficulty urinating, flank pain, frequency and hematuria.  Musculoskeletal:  Negative for arthralgias and back pain.  Skin:  Negative for rash.  Neurological:  Negative for dizziness, speech difficulty, weakness, numbness and headaches.   Physical Exam Updated Vital Signs BP (!) 112/55   Pulse 66   Temp 98.6 F (37 C) (Oral)   Resp 15   LMP 04/20/2011   SpO2 100%   Physical Exam Constitutional:      Appearance: She is well-developed. She is obese.  HENT:      Head: Normocephalic and atraumatic.  Eyes:     Pupils: Pupils are equal, round, and reactive to light.  Cardiovascular:     Rate and Rhythm: Normal rate and regular rhythm.  Heart sounds: Normal heart sounds.  Pulmonary:     Effort: Pulmonary effort is normal. No respiratory distress.     Breath sounds: Normal breath sounds. No wheezing or rales.  Chest:     Chest wall: No tenderness.  Abdominal:     General: Bowel sounds are normal.     Palpations: Abdomen is soft.     Tenderness: There is no abdominal tenderness. There is no guarding or rebound.  Musculoskeletal:        General: Normal range of motion.     Cervical back: Normal range of motion and neck supple.     Right lower leg: No edema.     Left lower leg: No edema.  Lymphadenopathy:     Cervical: No cervical adenopathy.  Skin:    General: Skin is warm and dry.     Findings: No rash.  Neurological:     Mental Status: She is alert and oriented to person, place, and time.    ED Results / Procedures / Treatments   Labs (all labs ordered are listed, but only abnormal results are displayed) Labs Reviewed  BASIC METABOLIC PANEL - Abnormal; Notable for the following components:      Result Value   Potassium 2.8 (*)    Glucose, Bld 111 (*)    All other components within normal limits  D-DIMER, QUANTITATIVE - Abnormal; Notable for the following components:   D-Dimer, Quant 0.53 (*)    All other components within normal limits  RESP PANEL BY RT-PCR (FLU A&B, COVID) ARPGX2  CBC WITH DIFFERENTIAL/PLATELET    EKG EKG Interpretation  Date/Time:  Sunday November 11 2021 21:28:18 EST Ventricular Rate:  85 PR Interval:  169 QRS Duration: 89 QT Interval:  366 QTC Calculation: 395 R Axis:   29 Text Interpretation: Sinus rhythm Paired ventricular premature complexes Borderline T wave abnormalities Baseline wander in lead(s) V4 since last tracing no significant change Confirmed by Malvin Johns [phone]) on 11/11/2021  10:18:13 PM  Radiology DG Chest 2 View  Result Date: 11/11/2021 CLINICAL DATA:  Initial evaluation for acute shortness of breath. EXAM: CHEST - 2 VIEW COMPARISON:  Prior radiograph from 09/03/2020. FINDINGS: Transverse heart size at the upper limits of normal. Mediastinal silhouette within normal limits. Lungs are mildly hypoinflated with elevation left hemidiaphragm. No focal infiltrates. No edema or effusion. No pneumothorax. No acute osseous finding.  Cervical ACDF noted. IMPRESSION: No active cardiopulmonary disease. Electronically Signed   By: Jeannine Boga M.D.   On: 11/11/2021 22:15    Procedures Procedures   Medications Ordered in ED Medications  albuterol (VENTOLIN HFA) 108 (90 Base) MCG/ACT inhaler 2 puff (2 puffs Inhalation Given 11/11/21 2343)  albuterol (PROVENTIL) (2.5 MG/3ML) 0.083% nebulizer solution 5 mg (5 mg Nebulization Given 11/11/21 2234)  potassium chloride SA (KLOR-CON) CR tablet 40 mEq (40 mEq Oral Given 11/11/21 2344)    ED Course  I have reviewed the triage vital signs and the nursing notes.  Pertinent labs & imaging results that were available during my care of the patient were reviewed by me and considered in my medical decision making (see chart for details).    MDM Rules/Calculators/A&P                           Patient presents with shortness of breath.  I do not hear any wheezing or rales on her lung exam.  She has no hypoxia.  No increased work of breathing.  No signs of fluid overload.  Her chest x-ray is clear without evidence of pneumonia or pneumothorax.  She does not have symptoms that sound more concerning for ACS.  Her D-dimer is elevated and she is awaiting a CTA of her chest.  However she is currently ambulating with no hypoxia or significant shortness of breath.  Her oxygenation is 100% on ambulation.  She did refuse COVID/flu testing.  Dr. Dayna Barker to take over care pending CT results and reevaluation. Final Clinical Impression(s) / ED  Diagnoses Final diagnoses:  SOB (shortness of breath)  Hypokalemia    Rx / DC Orders ED Discharge Orders     None        Malvin Johns, MD 11/12/21 0008

## 2021-11-12 ENCOUNTER — Emergency Department (HOSPITAL_COMMUNITY): Payer: Medicare Other

## 2021-11-12 ENCOUNTER — Encounter (HOSPITAL_COMMUNITY): Payer: Self-pay

## 2021-11-12 DIAGNOSIS — R0602 Shortness of breath: Secondary | ICD-10-CM | POA: Diagnosis not present

## 2021-11-12 MED ORDER — POTASSIUM CHLORIDE CRYS ER 20 MEQ PO TBCR: 40.0000 meq | EXTENDED_RELEASE_TABLET | Freq: Every day | ORAL | 0 refills | Status: AC

## 2021-11-12 MED ORDER — IOHEXOL 350 MG/ML SOLN
80.0000 mL | Freq: Once | INTRAVENOUS | Status: AC | PRN
  Administered 2021-11-12: 80 mL via INTRAVENOUS

## 2021-11-12 NOTE — ED Provider Notes (Signed)
12:03 AM Assumed care from Dr. Tamera Punt, please see their note for full history, physical and decision making until this point. In brief this is a 60 y.o. year old female who presented to the ED tonight with Shortness of Breath     SOB. Pending CTA. Potassium low, started replenishment.   CTA normal.  Patient persistently with normal oxygen saturations.  No other obvious cause for symptoms however she does feel better after breathing treatment.  She will continue albuterol at home.  She will continue potassium supplement at home until follow-up with her doctor to recheck her levels.  Discharge instructions, including strict return precautions for new or worsening symptoms, given. Patient and/or family verbalized understanding and agreement with the plan as described.   Labs, studies and imaging reviewed by myself and considered in medical decision making if ordered. Imaging interpreted by radiology.  Labs Reviewed  BASIC METABOLIC PANEL - Abnormal; Notable for the following components:      Result Value   Potassium 2.8 (*)    Glucose, Bld 111 (*)    All other components within normal limits  D-DIMER, QUANTITATIVE - Abnormal; Notable for the following components:   D-Dimer, Quant 0.53 (*)    All other components within normal limits  RESP PANEL BY RT-PCR (FLU A&B, COVID) ARPGX2  CBC WITH DIFFERENTIAL/PLATELET    DG Chest 2 View  Final Result    CT Angio Chest PE W/Cm &/Or Wo Cm    (Results Pending)    No follow-ups on file.    [name], Corene Cornea, MD 11/12/21 [phone]

## 2021-11-29 ENCOUNTER — Other Ambulatory Visit: Payer: Self-pay | Admitting: Internal Medicine

## 2021-11-30 LAB — C. TRACHOMATIS/N. GONORRHOEAE RNA
C. trachomatis RNA, TMA: NOT DETECTED
N. gonorrhoeae RNA, TMA: NOT DETECTED

## 2021-11-30 LAB — URINE CULTURE
MICRO NUMBER:: 12762249
SPECIMEN QUALITY:: ADEQUATE

## 2022-02-25 ENCOUNTER — Encounter: Payer: Self-pay | Admitting: Obstetrics and Gynecology

## 2022-02-25 ENCOUNTER — Other Ambulatory Visit: Payer: Self-pay

## 2022-02-25 ENCOUNTER — Other Ambulatory Visit (HOSPITAL_COMMUNITY)
Admission: RE | Admit: 2022-02-25 | Discharge: 2022-02-25 | Disposition: A | Discharge status: Home / Self Care | Payer: Medicare Other | Source: Ambulatory Visit | Attending: Family Medicine | Admitting: Family Medicine

## 2022-02-25 ENCOUNTER — Ambulatory Visit (INDEPENDENT_AMBULATORY_CARE_PROVIDER_SITE_OTHER): Payer: Medicaid Other | Admitting: Obstetrics and Gynecology

## 2022-02-25 ENCOUNTER — Ambulatory Visit: Payer: Medicare Other | Admitting: Physician Assistant

## 2022-02-25 DIAGNOSIS — Z1151 Encounter for screening for human papillomavirus (HPV): Secondary | ICD-10-CM | POA: Diagnosis not present

## 2022-02-25 DIAGNOSIS — Z01419 Encounter for gynecological examination (general) (routine) without abnormal findings: Secondary | ICD-10-CM | POA: Diagnosis present

## 2022-02-25 NOTE — Progress Notes (Signed)
61 y.o New GYN referral from [employer] presents for AEX/PAP. She has not had a Mammogram in years. ?

## 2022-02-25 NOTE — Progress Notes (Signed)
[name] is a 61 y.o. G43P2002 female here for a routine annual gynecologic exam.  Current complaints: No GYN Concerns.   Denies abnormal vaginal bleeding, discharge, pelvic pain, problems with intercourse or other gynecologic concerns.  ?  ?Gynecologic History ?Patient's last menstrual period was 04/20/2011. ?Contraception: abstinence and post menopausal status ?Last Pap: yrs ago. Results were: normal ?Last mammogram: NA.  ? ?Obstetric History ?OB History  ?Gravida Para Term Preterm AB Living  ?'2 2 2     2  '$ ?SAB IAB Ectopic Multiple Live Births  ?           ?  ?# Outcome Date GA Lbr Len/2nd Weight Sex Delivery Anes PTL Lv  ?2 Term           ?1 Term           ? ? ?Past Medical History:  ?Diagnosis Date  ? Anxiety   ? Asthma   ? Back pain   ? Chronic pain   ? DDD (degenerative disc disease), lumbar   ? Degenerative disc disease   ? Degenerative disc disease, cervical   ? Degenerative disc disease, cervical   ? Fibroid   ? Fibromyalgia   ? Headache   ? regular  ? Heartburn   ? Hypertension   ? Joint pain   ? Localized swelling of both lower legs   ? Osteoarthritis   ? Other specified disorders of thyroid   ? Rheumatoid arthritis ([place])   ? Sleep apnea   ? mild, patient does not use mask  ? SOB (shortness of breath)   ? ? ?Past Surgical History:  ?Procedure Laterality Date  ? BACK SURGERY    ? JOINT REPLACEMENT    ? NOVASURE ABLATION    ? TOTAL KNEE ARTHROPLASTY Left 01/17/2017  ? Procedure: LEFT TOTAL KNEE ARTHROPLASTY;  Surgeon: Mcarthur Rossetti, MD;  Location: [facility];  Service: Orthopedics;  Laterality: Left;  ? TUBAL LIGATION    ? ? ?Current Outpatient Medications on File Prior to Visit  ?Medication Sig Dispense Refill  ? albuterol (PROVENTIL HFA;VENTOLIN HFA) 108 (90 BASE) MCG/ACT inhaler Inhale 2 puffs into the lungs every 6 (six) hours as needed for shortness of breath.     ? albuterol (PROVENTIL) (2.5 MG/3ML) 0.083% nebulizer solution Take 2.5 mg by nebulization every 6 (six) hours as needed for  wheezing or shortness of breath.    ? atenolol (TENORMIN) 50 MG tablet Take 50 mg by mouth daily.    ? cetirizine (ZYRTEC) 10 MG tablet Take 10 mg by mouth daily as needed for allergies.    ? Cholecalciferol (VITAMIN D3) 125 MCG (5000 UT) CAPS Take 1 capsule (5,000 Units total) by mouth daily. 30 capsule 0  ? cyclobenzaprine (FLEXERIL) 10 MG tablet Take 10 mg by mouth 3 (three) times daily as needed for muscle spasms.    ? escitalopram (LEXAPRO) 10 MG tablet SMARTSIG:1 Tablet(s) By Mouth Every Evening    ? FLOVENT HFA 220 MCG/ACT inhaler USE 2 PUFFS TWICE A DAY  5  ? furosemide (LASIX) 20 MG tablet Take 20 mg by mouth daily as needed for fluid or edema.    ? ibuprofen (ADVIL,MOTRIN) 800 MG tablet Take 800 mg by mouth 3 (three) times daily as needed for moderate pain.   5  ? Insulin Pen Needle (BD PEN NEEDLE NANO 2ND GEN) 32G X 4 MM MISC 1 Package by Does not apply route 2 (two) times daily. 100 each 0  ? losartan-hydrochlorothiazide (  HYZAAR) 100-25 MG tablet Take 1 tablet by mouth daily.    ? NOREL AD 4-10-325 MG TABS Take 1 tablet by mouth 2 (two) times daily as needed.    ? Oxycodone HCl 20 MG TABS TAKE 1 TABLET BY MOUTH EVERY 4-6 HOURS (MAX OF 5 PER DAY)  0  ? OZEMPIC, 0.25 OR 0.5 MG/DOSE, 2 MG/1.5ML SOPN INJECT 0.'25MG'$  INTO THE SKIN ONE TIME PER WEEK 1.5 mL 0  ? potassium chloride SA (KLOR-CON) 20 MEQ tablet Take 2 tablets (40 mEq total) by mouth daily for 7 days. 14 tablet 0  ? ?No current facility-administered medications on file prior to visit.  ? ? ?No Known Allergies ? ?Social History  ? ?Socioeconomic History  ? Marital status: Single  ?  Spouse name: Not on file  ? Number of children: 2  ? Years of education: Not on file  ? Highest education level: Not on file  ?Occupational History  ? Occupation: n/a  ?Tobacco Use  ? Smoking status: Never  ? Smokeless tobacco: Never  ?Vaping Use  ? Vaping Use: Never used  ?Substance and Sexual Activity  ? Alcohol use: Not Currently  ? Drug use: No  ? Sexual activity: Yes   ?  Birth control/protection: Surgical  ?Other Topics Concern  ? Not on file  ?Social History Narrative  ? Not on file  ? ?Social Determinants of Health  ? ?Financial Resource Strain: Not on file  ?Food Insecurity: Not on file  ?Transportation Needs: Not on file  ?Physical Activity: Not on file  ?Stress: Not on file  ?Social Connections: Not on file  ?Intimate Partner Violence: Not on file  ? ? ?Family History  ?Problem Relation Age of Onset  ? Cancer Mother   ? Hypertension Mother   ? Eating disorder Mother   ? Obesity Mother   ? Diabetes Father   ? Heart disease Father   ? High Cholesterol Father   ? Kidney disease Father   ? Alcoholism Father   ? Eating disorder Father   ? Cancer Sister   ? Cancer Brother   ? ? ?The following portions of the patient's history were reviewed and updated as appropriate: allergies, current medications, past family history, past medical history, past social history, past surgical history and problem list. ? ?Review of Systems ?Pertinent items noted in HPI and remainder of comprehensive ROS otherwise negative. ?  ?Objective:  ?BP 133/86   Pulse 67   Ht '5\' 7"'$  (1.702 m)   Wt (!) 156 kg   LMP 04/20/2011   BMI 53.88 kg/m?  ?CONSTITUTIONAL: Well-developed, well-nourished female in no acute distress.  ?HENT:  Normocephalic, atraumatic, External right and left ear normal. Oropharynx is clear and moist ?EYES: Conjunctivae and EOM are normal. Pupils are equal, round, and reactive to light. No scleral icterus.  ?NECK: Normal range of motion, supple, no masses.  Normal thyroid.  ?SKIN: Skin is warm and dry. No rash noted. Not diaphoretic. No erythema. No pallor. ?[place]: Alert and oriented to person, place, and time. Normal reflexes, muscle tone coordination. No cranial nerve deficit noted. ?PSYCHIATRIC: Normal mood and affect. Normal behavior. Normal judgment and thought content. ?CARDIOVASCULAR: Normal heart rate noted, regular rhythm ?RESPIRATORY: Clear to auscultation bilaterally.  Effort and breath sounds normal, no problems with respiration noted. ?BREASTS: Symmetric in size. No masses, skin changes, nipple drainage, or lymphadenopathy. ?ABDOMEN: Soft, normal bowel sounds, no distention noted.  No tenderness, rebound or guarding.  ?PELVIC: Normal appearing external genitalia; normal appearing  vaginal mucosa and cervix.  No abnormal discharge noted.  Pap smear obtained.  Normal uterine size, no other palpable masses, no uterine or adnexal tenderness. Bimanual exam limited by pt habitus ?MUSCULOSKELETAL: Normal range of motion. No tenderness.  No cyanosis, clubbing, or edema.  2+ distal pulses. ? ? ?Assessment:  ?Annual gynecologic examination with pap smear ?  ?Plan:  ?Will follow up results of pap smear and manage accordingly. ?Mammogram scheduled ?Routine preventative health maintenance measures emphasized. ?Please refer to After Visit Summary for other counseling recommendations.  ? ? ?Chancy Milroy, MD, FACOG ?Attending [place] for [employer], [place]  ?

## 2022-02-25 NOTE — Patient Instructions (Signed)

## 2022-02-26 LAB — CYTOLOGY - PAP
Comment: NEGATIVE
Diagnosis: NEGATIVE
High risk HPV: NEGATIVE

## 2022-02-28 ENCOUNTER — Ambulatory Visit: Payer: Medicare Other | Admitting: Physician Assistant

## 2022-03-04 ENCOUNTER — Ambulatory Visit: Payer: Medicare Other | Admitting: Physician Assistant

## 2022-03-18 ENCOUNTER — Encounter: Payer: Self-pay | Admitting: Physician Assistant

## 2022-03-18 ENCOUNTER — Ambulatory Visit (INDEPENDENT_AMBULATORY_CARE_PROVIDER_SITE_OTHER): Payer: Medicare Other | Admitting: Physician Assistant

## 2022-03-18 VITALS — Ht 67.0 in | Wt 348.6 lb

## 2022-03-18 DIAGNOSIS — M1711 Unilateral primary osteoarthritis, right knee: Secondary | ICD-10-CM | POA: Diagnosis not present

## 2022-03-18 MED ORDER — LIDOCAINE HCL 1 % IJ SOLN
3.0000 mL | INTRAMUSCULAR | Status: AC | PRN
  Administered 2022-03-18: 3 mL

## 2022-03-18 MED ORDER — METHYLPREDNISOLONE ACETATE 40 MG/ML IJ SUSP
40.0000 mg | INTRAMUSCULAR | Status: AC | PRN
  Administered 2022-03-18: 40 mg via INTRA_ARTICULAR

## 2022-03-18 NOTE — Progress Notes (Signed)
? ?Office Visit Note ?  ?Patient: [name]           ?Date of Birth: December 25, 1960           ?MRN: [id number] ?Visit Date: 03/18/2022 ?             ?Requested by: Danielle Ebbs, MD ?[address] ?[place],  [place] [address] ?PCP: Danielle Ebbs, MD ? ? ?Assessment & Plan: ?Visit Diagnoses:  ?1. Unilateral primary osteoarthritis, right knee   ? ? ?Plan:  ?We will see how she does with the cortisone injection.  She will look into a different weight loss clinic and let us know if she needs a referral.  Discussed with her knee friendly exercises knee and how weight loss and exercise can benefit her knee pain.  She understands to wait at least 3 months between cortisone injections. ? ?Follow-Up Instructions: Return if symptoms worsen or fail to improve.  ? ?Orders:  ?Orders Placed This Encounter  ?Procedures  ? Large Joint Inj  ? ?No orders of the defined types were placed in this encounter. ? ? ? ? Procedures: ?Large Joint Inj: R knee on 03/18/2022 4:36 PM ?Indications: pain ?Details: 22 G 1.5 in needle, anterolateral approach ? ?Arthrogram: No ? ?Medications: 3 mL lidocaine 1 %; 40 mg methylPREDNISolone acetate 40 MG/ML ?Outcome: tolerated well, no immediate complications ?Procedure, treatment alternatives, risks and benefits explained, specific risks discussed. Consent was given by the patient. Immediately prior to procedure a time out was called to verify the correct patient, procedure, equipment, support staff and site/side marked as required. Patient was prepped and draped in the usual sterile fashion.  ? ? ? ? ?Clinical Data: ?No additional findings. ? ? ?Subjective: ?Chief Complaint  ?Patient presents with  ? Right Knee - Pain  ? ? ?HPI ?[name] is well-known to Dr. Ninfa [name] service comes in today with right knee pain.  She was last seen on 10/22/2021 for the right knee and given injection.  She has had no known injury to the knee.  She feels like both knees may have fluid in them.  Feels tightness in both  knees but more so pain in the right knee.  She has a history of a left total knee arthroplasty.  Prior radiographs of her knee obtained in October of last year showed near bone-on-bone medial compartment severe patellofemoral changes.  Mild to moderate lateral compartmental arthritic changes. ? ?Review of Systems ?See HPI ? ?Objective: ?Vital Signs: Ht '5\' 7"'$  (1.702 m)   Wt (!) 348 lb 9.6 oz (158.1 kg)   LMP 04/20/2011   BMI 54.60 kg/m?  ? ?Physical Exam ?Constitutional:   ?   Appearance: She is not ill-appearing or diaphoretic.  ?Pulmonary:  ?   Effort: Pulmonary effort is normal.  ?Neurological:  ?   Mental Status: She is alert and oriented to person, place, and time.  ?Psychiatric:     ?   Mood and Affect: Mood normal.  ? ? ?Ortho Exam ?Right knee no abnormal warmth erythema or effusion.  Tenderness along medial joint line no instability valgus varus stressing right knee. ? ?Specialty Comments:  ?No specialty comments available. ? ?Imaging: ?No results found. ? ? ?PMFS History: ?Patient Active Problem List  ? Diagnosis Date Noted  ? Visit for routine gyn exam 02/25/2022  ? Vitamin D deficiency 02/21/2021  ? Essential hypertension 02/21/2021  ? Pure hypercholesterolemia 02/21/2021  ? Class 3 severe obesity with serious comorbidity and body mass index (BMI) of  50.0 to 59.9 in adult [facility]) 06/13/2020  ? Prediabetes 06/13/2020  ? Leg pain 06/13/2020  ? Unilateral primary osteoarthritis, right knee 02/02/2020  ? Status post total left knee replacement 01/17/2017  ? Unilateral primary osteoarthritis, left knee 12/05/2016  ? Degenerative disc disease, cervical   ? ?Past Medical History:  ?Diagnosis Date  ? Anxiety   ? Asthma   ? Back pain   ? Chronic pain   ? DDD (degenerative disc disease), lumbar   ? Degenerative disc disease   ? Degenerative disc disease, cervical   ? Degenerative disc disease, cervical   ? Fibroid   ? Fibromyalgia   ? Headache   ? regular  ? Heartburn   ? Hypertension   ? Joint pain   ? Localized  swelling of both lower legs   ? Osteoarthritis   ? Other specified disorders of thyroid   ? Rheumatoid arthritis ([place])   ? Sleep apnea   ? mild, patient does not use mask  ? SOB (shortness of breath)   ?  ?Family History  ?Problem Relation Age of Onset  ? Cancer Mother   ? Hypertension Mother   ? Eating disorder Mother   ? Obesity Mother   ? Diabetes Father   ? Heart disease Father   ? High Cholesterol Father   ? Kidney disease Father   ? Alcoholism Father   ? Eating disorder Father   ? Cancer Sister   ? Cancer Brother   ?  ?Past Surgical History:  ?Procedure Laterality Date  ? BACK SURGERY    ? JOINT REPLACEMENT    ? NOVASURE ABLATION    ? TOTAL KNEE ARTHROPLASTY Left 01/17/2017  ? Procedure: LEFT TOTAL KNEE ARTHROPLASTY;  Surgeon: Danielle Rossetti, MD;  Location: [facility];  Service: Orthopedics;  Laterality: Left;  ? TUBAL LIGATION    ? ?Social History  ? ?Occupational History  ? Occupation: n/a  ?Tobacco Use  ? Smoking status: Never  ? Smokeless tobacco: Never  ?Vaping Use  ? Vaping Use: Never used  ?Substance and Sexual Activity  ? Alcohol use: Not Currently  ? Drug use: No  ? Sexual activity: Yes  ?  Birth control/protection: Surgical  ? ? ? ? ? ? ?

## 2022-03-20 ENCOUNTER — Encounter: Payer: Self-pay | Admitting: Orthopaedic Surgery

## 2022-03-20 ENCOUNTER — Ambulatory Visit (INDEPENDENT_AMBULATORY_CARE_PROVIDER_SITE_OTHER): Payer: Medicare Other | Admitting: Orthopaedic Surgery

## 2022-03-20 DIAGNOSIS — M25562 Pain in left knee: Secondary | ICD-10-CM

## 2022-03-20 DIAGNOSIS — M79652 Pain in left thigh: Secondary | ICD-10-CM

## 2022-03-20 MED ORDER — METHYLPREDNISOLONE ACETATE 40 MG/ML IJ SUSP
40.0000 mg | INTRAMUSCULAR | Status: AC | PRN
  Administered 2022-03-20: 40 mg via INTRA_ARTICULAR

## 2022-03-20 MED ORDER — LIDOCAINE HCL 1 % IJ SOLN
3.0000 mL | INTRAMUSCULAR | Status: AC | PRN
  Administered 2022-03-20: 3 mL

## 2022-03-20 NOTE — Progress Notes (Signed)
? ?Office Visit Note ?  ?Patient: [name]           ?Date of Birth: 08/05/1961           ?MRN: [id number] ?Visit Date: 03/20/2022 ?             ?Requested by: Nolene Ebbs, MD ?[address] ?[place],  [place] [address] ?PCP: Nolene Ebbs, MD ? ? ?Assessment & Plan: ?Visit Diagnoses:  ?1. Left thigh pain   ? ? ?Plan: I do feel that most of her pain in her left side is due to her offloading her right knee.  I recommended a steroid injection over the trochanteric area on the left side which was painful to her and she tolerated that injection well.  We have counseled her about weight loss.  All questions and concerns were answered and addressed.  Follow-up is as needed. ? ?Follow-Up Instructions: Return if symptoms worsen or fail to improve.  ? ?Orders:  ?Orders Placed This Encounter  ?Procedures  ? Large Joint Inj  ? ?No orders of the defined types were placed in this encounter. ? ? ? ? Procedures: ?Large Joint Inj: L greater trochanter on 03/20/2022 4:10 PM ?Indications: pain and diagnostic evaluation ?Details: 22 G 1.5 in needle, lateral approach ? ?Arthrogram: No ? ?Medications: 3 mL lidocaine 1 %; 40 mg methylPREDNISolone acetate 40 MG/ML ?Outcome: tolerated well, no immediate complications ?Procedure, treatment alternatives, risks and benefits explained, specific risks discussed. Consent was given by the patient. Immediately prior to procedure a time out was called to verify the correct patient, procedure, equipment, support staff and site/side marked as required. Patient was prepped and draped in the usual sterile fashion.  ? ? ? ? ?Clinical Data: ?No additional findings. ? ? ?Subjective: ?Chief Complaint  ?Patient presents with  ? Left Knee - Pain  ?The patient is on with known arthritis in the right knee but just had a steroid injection in that right knee 2 days ago.  It helped ease off right thigh pain significantly that she comes in today wanting to have her left lower extremity assessed.  She does  have a history of a left knee replacement that we did in 2020.  She reports thigh pain on the left side.  She has pain over the trochanteric area as well.  She is morbidly obese with a BMI of almost 55.  She is only prediabetic.  Since she had such a good response with her steroid injection in the right knee she wonders if having something done on the left side will help.  I believe her left side is more of a trochanteric bursitis and IT band pain as she is offloaded her right knee protecting her arthritic right knee. ? ?HPI ? ?Review of Systems ?There is no listed fever, chills, nausea, vomiting ? ?Objective: ?Vital Signs: LMP 04/20/2011  ? ?Physical Exam ?She is alert and orient x3 and in no acute distress ?Ortho Exam ?Examination of her left lower extremity she has pain over the left hip and IT band area and some thigh pain.  Her left knee appears stable. ?Specialty Comments:  ?No specialty comments available. ? ?Imaging: ?No results found. ? ? ?PMFS History: ?Patient Active Problem List  ? Diagnosis Date Noted  ? Visit for routine gyn exam 02/25/2022  ? Vitamin D deficiency 02/21/2021  ? Essential hypertension 02/21/2021  ? Pure hypercholesterolemia 02/21/2021  ? Class 3 severe obesity with serious comorbidity and body mass index (BMI) of 50.0 to  59.9 in adult [facility]) 06/13/2020  ? Prediabetes 06/13/2020  ? Leg pain 06/13/2020  ? Unilateral primary osteoarthritis, right knee 02/02/2020  ? Status post total left knee replacement 01/17/2017  ? Unilateral primary osteoarthritis, left knee 12/05/2016  ? Degenerative disc disease, cervical   ? ?Past Medical History:  ?Diagnosis Date  ? Anxiety   ? Asthma   ? Back pain   ? Chronic pain   ? DDD (degenerative disc disease), lumbar   ? Degenerative disc disease   ? Degenerative disc disease, cervical   ? Degenerative disc disease, cervical   ? Fibroid   ? Fibromyalgia   ? Headache   ? regular  ? Heartburn   ? Hypertension   ? Joint pain   ? Localized swelling of both lower  legs   ? Osteoarthritis   ? Other specified disorders of thyroid   ? Rheumatoid arthritis ([place])   ? Sleep apnea   ? mild, patient does not use mask  ? SOB (shortness of breath)   ?  ?Family History  ?Problem Relation Age of Onset  ? Cancer Mother   ? Hypertension Mother   ? Eating disorder Mother   ? Obesity Mother   ? Diabetes Father   ? Heart disease Father   ? High Cholesterol Father   ? Kidney disease Father   ? Alcoholism Father   ? Eating disorder Father   ? Cancer Sister   ? Cancer Brother   ?  ?Past Surgical History:  ?Procedure Laterality Date  ? BACK SURGERY    ? JOINT REPLACEMENT    ? NOVASURE ABLATION    ? TOTAL KNEE ARTHROPLASTY Left 01/17/2017  ? Procedure: LEFT TOTAL KNEE ARTHROPLASTY;  Surgeon: Mcarthur Rossetti, MD;  Location: [facility];  Service: Orthopedics;  Laterality: Left;  ? TUBAL LIGATION    ? ?Social History  ? ?Occupational History  ? Occupation: n/a  ?Tobacco Use  ? Smoking status: Never  ? Smokeless tobacco: Never  ?Vaping Use  ? Vaping Use: Never used  ?Substance and Sexual Activity  ? Alcohol use: Not Currently  ? Drug use: No  ? Sexual activity: Yes  ?  Birth control/protection: Surgical  ? ? ? ? ? ? ?

## 2022-04-09 ENCOUNTER — Ambulatory Visit: Payer: Medicare Other

## 2022-06-26 ENCOUNTER — Other Ambulatory Visit: Payer: Self-pay

## 2022-06-26 ENCOUNTER — Encounter: Payer: Self-pay | Admitting: Orthopaedic Surgery

## 2022-06-26 ENCOUNTER — Ambulatory Visit (INDEPENDENT_AMBULATORY_CARE_PROVIDER_SITE_OTHER): Payer: Medicare Other | Admitting: Orthopaedic Surgery

## 2022-06-26 ENCOUNTER — Ambulatory Visit (INDEPENDENT_AMBULATORY_CARE_PROVIDER_SITE_OTHER): Payer: Medicare Other

## 2022-06-26 DIAGNOSIS — M25561 Pain in right knee: Secondary | ICD-10-CM | POA: Diagnosis not present

## 2022-06-26 DIAGNOSIS — G8929 Other chronic pain: Secondary | ICD-10-CM | POA: Diagnosis not present

## 2022-06-26 DIAGNOSIS — Z96652 Presence of left artificial knee joint: Secondary | ICD-10-CM | POA: Diagnosis not present

## 2022-06-26 NOTE — Progress Notes (Signed)
The patient is well-known to me.  We replaced her left knee in 2018.  She still has problems with that knee on times where she feels like some give out on her or it feels tight.  Her last weight recorded in the chart is 348 pounds.  She has been dealing with chronic right knee pain and has known severe incisional as well as the right knee.  On exam her right knee has varus malalignment and medial tenderness as well as patellofemoral tenderness her left knee shows a well-healed surgical incision.  Given the size of her knees is difficult to get a good ligamentous exam but it feels like the knee is stable to me however she does feel like it gives out on her.  An AP and lateral of that left knee shows a well-seated total knee arthroplasty with no complicating features.  The AP view also shows the right knee that shows severe end-stage arthritis.  I have advocated weight loss which could help her the most but we even replaced her knee when she was morbidly obese.  I would like to send her to outpatient physical therapy for them to work on quad strengthening exercises for both knees.  We will see her back in 6 weeks to see how she is doing overall.  One consideration would be considering surgery on the left knee to upsize her polyliner which is just a 9 mm liner.  All question concerns were answered and addressed.  I did of note place a steroid injection in her right knee today which she tolerated well.

## 2022-07-04 ENCOUNTER — Other Ambulatory Visit: Payer: Self-pay | Admitting: Internal Medicine

## 2022-07-05 LAB — CBC
HCT: 41.4 % (ref 35.0–45.0)
Hemoglobin: 14.2 g/dL (ref 11.7–15.5)
MCH: 31.5 pg (ref 27.0–33.0)
MCHC: 34.3 g/dL (ref 32.0–36.0)
MCV: 91.8 fL (ref 80.0–100.0)
MPV: 9.3 fL (ref 7.5–12.5)
Platelets: 424 10*3/uL — ABNORMAL HIGH (ref 140–400)
RBC: 4.51 10*6/uL (ref 3.80–5.10)
RDW: 13.1 % (ref 11.0–15.0)
WBC: 6.3 10*3/uL (ref 3.8–10.8)

## 2022-07-05 LAB — COMPLETE METABOLIC PANEL WITH GFR
AG Ratio: 1.4 (calc) (ref 1.0–2.5)
ALT: 16 U/L (ref 6–29)
AST: 18 U/L (ref 10–35)
Albumin: 4.3 g/dL (ref 3.6–5.1)
Alkaline phosphatase (APISO): 81 U/L (ref 37–153)
BUN: 21 mg/dL (ref 7–25)
CO2: 25 mmol/L (ref 20–32)
Calcium: 10 mg/dL (ref 8.6–10.4)
Chloride: 101 mmol/L (ref 98–110)
Creat: 0.92 mg/dL (ref 0.50–1.05)
Globulin: 3 g/dL (calc) (ref 1.9–3.7)
Glucose, Bld: 91 mg/dL (ref 65–99)
Potassium: 3.9 mmol/L (ref 3.5–5.3)
Sodium: 139 mmol/L (ref 135–146)
Total Bilirubin: 0.7 mg/dL (ref 0.2–1.2)
Total Protein: 7.3 g/dL (ref 6.1–8.1)
eGFR: 70 mL/min/{1.73_m2} (ref >=60)

## 2022-07-05 LAB — LIPID PANEL
Cholesterol: 204 mg/dL — ABNORMAL HIGH (ref <200)
HDL: 57 mg/dL (ref >=50)
LDL Cholesterol (Calc): 128 mg/dL (calc) — ABNORMAL HIGH
Non-HDL Cholesterol (Calc): 147 mg/dL (calc) — ABNORMAL HIGH (ref <130)
Total CHOL/HDL Ratio: 3.6 (calc) (ref <5.0)
Triglycerides: 91 mg/dL (ref <150)

## 2022-07-05 LAB — VITAMIN D 25 HYDROXY (VIT D DEFICIENCY, FRACTURES): Vit D, 25-Hydroxy: 52 ng/mL (ref 30–100)

## 2022-07-05 LAB — TSH: TSH: 1.68 mIU/L (ref 0.40–4.50)

## 2022-07-10 ENCOUNTER — Other Ambulatory Visit: Payer: Self-pay | Admitting: Obstetrics and Gynecology

## 2022-07-10 DIAGNOSIS — Z01419 Encounter for gynecological examination (general) (routine) without abnormal findings: Secondary | ICD-10-CM

## 2022-07-22 NOTE — Therapy (Unsigned)
OUTPATIENT PHYSICAL THERAPY LOWER EXTREMITY EVALUATION   Patient Name: LOREE SHEHATA MRN: [id number] DOB:1960-12-24, 61 y.o., female Today's Date: 07/25/2022   PT End of Session - 07/25/22 1138     Visit Number 1    Number of Visits 16    Authorization Type [employer]ICARE    PT Start Time 1134    PT Stop Time 1218    PT Time Calculation (min) 44 min    Activity Tolerance Patient tolerated treatment well    Behavior During Therapy WFL for tasks assessed/performed             Past Medical History:  Diagnosis Date   Anxiety    Asthma    Back pain    Chronic pain    DDD (degenerative disc disease), lumbar    Degenerative disc disease    Degenerative disc disease, cervical    Degenerative disc disease, cervical    Fibroid    Fibromyalgia    Headache    regular   Heartburn    Hypertension    Joint pain    Localized swelling of both lower legs    Osteoarthritis    Other specified disorders of thyroid    Rheumatoid arthritis ([place])    Sleep apnea    mild, patient does not use mask   SOB (shortness of breath)    Past Surgical History:  Procedure Laterality Date   BACK SURGERY     JOINT REPLACEMENT     NOVASURE ABLATION     TOTAL KNEE ARTHROPLASTY Left 01/17/2017   Procedure: LEFT TOTAL KNEE ARTHROPLASTY;  Surgeon: Mcarthur Rossetti, MD;  Location: [facility];  Service: Orthopedics;  Laterality: Left;   TUBAL LIGATION     Patient Active Problem List   Diagnosis Date Noted   Visit for routine gyn exam 02/25/2022   Vitamin D deficiency 02/21/2021   Essential hypertension 02/21/2021   Pure hypercholesterolemia 02/21/2021   Class 3 severe obesity with serious comorbidity and body mass index (BMI) of 50.0 to 59.9 in adult [facility]) 06/13/2020   Prediabetes 06/13/2020   Leg pain 06/13/2020   Unilateral primary osteoarthritis, right knee 02/02/2020   Status post total left knee replacement 01/17/2017   Unilateral primary osteoarthritis, left knee  12/05/2016   Degenerative disc disease, cervical     PCP: Nolene Ebbs, MD  REFERRING PROVIDER: Mcarthur Rossetti, MD  REFERRING DIAG: History of left knee replacement [Z96.652], Chronic pain of right knee [M25.561, G89.29]  THERAPY DIAG:  Chronic pain of right knee  Stiffness of right knee, not elsewhere classified  Stiffness of left knee, not elsewhere classified  Muscle weakness (generalized)  Other abnormalities of gait and mobility  Difficulty in walking, not elsewhere classified  Chronic pain of left knee  Rationale for Evaluation and Treatment Rehabilitation  ONSET DATE: 23 years ago.   SUBJECTIVE:   SUBJECTIVE STATEMENT: Pt presents to PT with acute on chronic bilateral knee pain. She has a history of a L TKA in 2018, but does not feel as though she fully recovered from it. Pt reports minimal pain in L knee but increased tightness, weakness and buckling. Pt with severe pain in her R knee with known OA present and need for a TKA, but is refusing. Cortisone shot in R knee 2 weeks ago with improvements in pain noted. Pt reports using a quad can and 4 wheel walker at home. She also has one in her car, but only uses it as needed.  PERTINENT HISTORY: Anxiety, Asthma, Chronic pain, DDD, Fibromyalgia, HTN, OA, SOB   PAIN:  Are you having pain? Yes: NPRS scale: 8/10 Pain location: R with tightness in L Pain description: Constant dull ache in R knee with intermittent sharp pains. L knee feels tight and "funny" like she never got her full sensation back.  Aggravating factors: Squatting, walking, stairs, standing.  Relieving factors: Pain medication, heat, laying down.   PRECAUTIONS: None  WEIGHT BEARING RESTRICTIONS No  FALLS:  Has patient fallen in last 6 months? No  LIVING ENVIRONMENT: Lives with: lives with their son Lives in: House/apartment Stairs: No Has following equipment at home: Control and instrumentation engineer - 4 wheeled, used when pt is  experiencing significant pain/ weakness.    OCCUPATION: Retired.   PLOF: Independent  PATIENT GOALS Pt would like to be able to walk and negotiate stairs without familiar pain.    OBJECTIVE:   DIAGNOSTIC FINDINGS: None.   PATIENT SURVEYS:  FOTO 35%, 51% Predicted.   COGNITION:  Overall cognitive status: Within functional limits for tasks assessed   SENSATION: WFL  EDEMA:  Circumferential: 22" L, 22.5" Jt line  MUSCLE LENGTH: Hamstrings: Pt has tightness present in both legs with pain noted in R.   POSTURE: rounded shoulders, forward head, and anterior pelvic tilt  PALPATION: No tenderness on L knee, but tightness in L HS Lateral tenderness in lateral calf and VMO on R knee. Increased tension in lateral calf and HS.   LOWER EXTREMITY ROM:  Active/Passive ROM Right eval Left eval  Hip flexion    Hip extension    Hip abduction    Hip adduction    Hip internal rotation    Hip external rotation    Knee flexion 105/108 95/98  Knee extension 0 0  Ankle dorsiflexion    Ankle plantarflexion    Ankle inversion    Ankle eversion     (Blank rows = not tested)  LOWER EXTREMITY MMT:  MMT Right eval Left eval  Hip flexion Pt unable to get into position.  Pt unable to get into position.   Hip extension    Hip abduction 3+ 3+  Hip adduction    Hip internal rotation    Hip external rotation    Knee flexion 4- P! 4  Knee extension 4- P! 4   (Blank rows = not tested)   FUNCTIONAL TESTS:  5 times sit to stand: 19.84 sec Timed up and go (TUG): 18.53 sec   SLS: L3 sec, R3 sec  GAIT: Distance walked: 41f Assistive device utilized: None Level of assistance: Complete Independence Comments: Pt walks with antalgic gait pattern.   TODAY'S TREATMENT: Creating, reviewing, and completing below HEP  PATIENT EDUCATION:  Education details: Educated pt on anatomy and physiology of current symptoms, questionnaire, diagnosis, prognosis, HEP,  and POC. Person educated:  Patient Education method: Explanation and Demonstration Education comprehension: verbalized understanding and returned demonstration   HOME EXERCISE PROGRAM: Access Code: [email or web address]: [email or web address] Date: 07/25/2022 Prepared by: SRudi Heap Exercises - Seated Long Arc Quad  - 2 x daily - 7 x weekly - 3 sets - 10 reps - 2 hold - Seated Hamstring Stretch  - 2 x daily - 7 x weekly - 2 sets - 30 hold - Seated Hip Adduction Isometrics with Ball  - 2 x daily - 7 x weekly - 2 sets - 10 reps - 5 hold  ASSESSMENT:  CLINICAL IMPRESSION: VGeneiveis a 61y.o. female who  presents to clinic with signs and sxs consistent with bilat knee pain. Pt with antalgic gait pattern, and decreased ROM. Pt will benefit from skilled PT to address continued deficits.   OBJECTIVE IMPAIRMENTS Abnormal gait, decreased activity tolerance, decreased balance, decreased endurance, decreased mobility, difficulty walking, decreased ROM, decreased strength, impaired flexibility, impaired sensation, obesity, and pain.   ACTIVITY LIMITATIONS carrying, lifting, sitting, standing, squatting, sleeping, stairs, and transfers  PARTICIPATION LIMITATIONS: meal prep, cleaning, laundry, driving, and community activity  PERSONAL FACTORS Age, Past/current experiences, Time since onset of injury/illness/exacerbation, and 3+ comorbidities: Anxiety, Asthma, Chronic pain, DDD, Fibromyalgia, HTN, OA, SOB  are also affecting patient's functional outcome.   REHAB POTENTIAL: Good  CLINICAL DECISION MAKING: Stable/uncomplicated  EVALUATION COMPLEXITY: Low   GOALS: Goals reviewed with patient? No  SHORT TERM GOALS: Target date: 08/22/2022  Pt will be I and compliant with initial HEP. Baseline: provided at eval Goal status: INITIAL  2.  Pt will report </=5/10 pain at baseline  Baseline: 8/10 Goal status: INITIAL  LONG TERM GOALS: Target date: 09/19/2022   Pt will be independent with advanced HEP to continue  to address postural limitations and muscle imbalances. Baseline:  Goal status: INITIAL  2.  Pt will report </=3/10 pain at baseline  Baseline: 8/10 Goal status: INITIAL  3.  Pt will improve FOTO function score to no less than 51% as proxy for functional improvement Baseline: 35% Goal status: INITIAL  4.  Pt will improve TUG by 3.4 seconds or Minimal clinically important difference.  Baseline: 18.53 sec Goal status: INITIAL  5.  Pt will improve her STS by 2.3 seconds for Minimal clinically important difference.  Baseline: 19.84 sec Goal status: INITIAL  6.  Pt will be able to stand for >10 sec in single leg stance, to show a significant improvement in balance in order to reduce fall risk  Baseline: L3 sec, R3 sec Goal status: INITIAL   PLAN: PT FREQUENCY: 1x/week  PT DURATION: 8 weeks  PLANNED INTERVENTIONS: Therapeutic exercises, Therapeutic activity, Neuromuscular re-education, Balance training, Gait training, Patient/Family education, Self Care, Joint mobilization, Joint manipulation, Stair training, Aquatic Therapy, Dry Needling, Electrical stimulation, Cryotherapy, Moist heat, Manual lymph drainage, scar mobilization, Traction, Ultrasound, Ionotophoresis '4mg'$ /ml Dexamethasone, and Manual therapy  PLAN FOR NEXT SESSION: Assess HEP/update PRN, continue to progress functional mobility, strengthen prox hip/ muscles. Decrease patients pain and help minimize functional mobility. Gait and stair training.     Lynden Ang, PT 07/25/2022, 1:45 PM

## 2022-07-24 ENCOUNTER — Encounter (INDEPENDENT_AMBULATORY_CARE_PROVIDER_SITE_OTHER): Payer: Self-pay

## 2022-07-25 ENCOUNTER — Ambulatory Visit: Payer: Medicare Other | Attending: Orthopaedic Surgery | Admitting: Physical Therapy

## 2022-07-25 ENCOUNTER — Other Ambulatory Visit: Payer: Self-pay

## 2022-07-25 DIAGNOSIS — M25662 Stiffness of left knee, not elsewhere classified: Secondary | ICD-10-CM | POA: Insufficient documentation

## 2022-07-25 DIAGNOSIS — M25661 Stiffness of right knee, not elsewhere classified: Secondary | ICD-10-CM | POA: Diagnosis present

## 2022-07-25 DIAGNOSIS — R262 Difficulty in walking, not elsewhere classified: Secondary | ICD-10-CM | POA: Diagnosis present

## 2022-07-25 DIAGNOSIS — Z96652 Presence of left artificial knee joint: Secondary | ICD-10-CM | POA: Insufficient documentation

## 2022-07-25 DIAGNOSIS — R2689 Other abnormalities of gait and mobility: Secondary | ICD-10-CM | POA: Insufficient documentation

## 2022-07-25 DIAGNOSIS — M25562 Pain in left knee: Secondary | ICD-10-CM | POA: Insufficient documentation

## 2022-07-25 DIAGNOSIS — M25561 Pain in right knee: Secondary | ICD-10-CM | POA: Diagnosis present

## 2022-07-25 DIAGNOSIS — M6281 Muscle weakness (generalized): Secondary | ICD-10-CM | POA: Insufficient documentation

## 2022-07-25 DIAGNOSIS — G8929 Other chronic pain: Secondary | ICD-10-CM | POA: Insufficient documentation

## 2022-07-31 ENCOUNTER — Ambulatory Visit: Payer: Medicare Other

## 2022-07-31 DIAGNOSIS — R2689 Other abnormalities of gait and mobility: Secondary | ICD-10-CM

## 2022-07-31 DIAGNOSIS — M25662 Stiffness of left knee, not elsewhere classified: Secondary | ICD-10-CM

## 2022-07-31 DIAGNOSIS — R262 Difficulty in walking, not elsewhere classified: Secondary | ICD-10-CM

## 2022-07-31 DIAGNOSIS — M25661 Stiffness of right knee, not elsewhere classified: Secondary | ICD-10-CM

## 2022-07-31 DIAGNOSIS — M25561 Pain in right knee: Secondary | ICD-10-CM | POA: Diagnosis not present

## 2022-07-31 DIAGNOSIS — G8929 Other chronic pain: Secondary | ICD-10-CM

## 2022-07-31 DIAGNOSIS — M6281 Muscle weakness (generalized): Secondary | ICD-10-CM

## 2022-07-31 NOTE — Therapy (Signed)
OUTPATIENT PHYSICAL THERAPY TREATMENT NOTE   Patient Name: [name] MRN: [id number] DOB:1961-09-16, 61 y.o., female Today's Date: 07/31/2022  PCP: Nolene Ebbs, MD REFERRING PROVIDER: Mcarthur Rossetti, MD  END OF SESSION:   PT End of Session - 07/31/22 1530     Visit Number 2    Number of Visits 16    Authorization Type [employer]    PT Start Time 6283    PT Stop Time 1612    PT Time Calculation (min) 42 min    Activity Tolerance Patient tolerated treatment well    Behavior During Therapy WFL for tasks assessed/performed             Past Medical History:  Diagnosis Date   Anxiety    Asthma    Back pain    Chronic pain    DDD (degenerative disc disease), lumbar    Degenerative disc disease    Degenerative disc disease, cervical    Degenerative disc disease, cervical    Fibroid    Fibromyalgia    Headache    regular   Heartburn    Hypertension    Joint pain    Localized swelling of both lower legs    Osteoarthritis    Other specified disorders of thyroid    Rheumatoid arthritis ([place])    Sleep apnea    mild, patient does not use mask   SOB (shortness of breath)    Past Surgical History:  Procedure Laterality Date   BACK SURGERY     JOINT REPLACEMENT     NOVASURE ABLATION     TOTAL KNEE ARTHROPLASTY Left 01/17/2017   Procedure: LEFT TOTAL KNEE ARTHROPLASTY;  Surgeon: Mcarthur Rossetti, MD;  Location: [facility];  Service: Orthopedics;  Laterality: Left;   TUBAL LIGATION     Patient Active Problem List   Diagnosis Date Noted   Visit for routine gyn exam 02/25/2022   Vitamin D deficiency 02/21/2021   Essential hypertension 02/21/2021   Pure hypercholesterolemia 02/21/2021   Class 3 severe obesity with serious comorbidity and body mass index (BMI) of 50.0 to 59.9 in adult [facility]) 06/13/2020   Prediabetes 06/13/2020   Leg pain 06/13/2020   Unilateral primary osteoarthritis, right knee 02/02/2020   Status post total left  knee replacement 01/17/2017   Unilateral primary osteoarthritis, left knee 12/05/2016   Degenerative disc disease, cervical     REFERRING DIAG: History of left knee replacement [Z96.652], Chronic pain of right knee [M25.561, G89.29]  THERAPY DIAG:  Chronic pain of right knee  Stiffness of right knee, not elsewhere classified  Stiffness of left knee, not elsewhere classified  Muscle weakness (generalized)  Other abnormalities of gait and mobility  Difficulty in walking, not elsewhere classified  Chronic pain of left knee  Rationale for Evaluation and Treatment Rehabilitation  PERTINENT HISTORY: Anxiety, Asthma, Chronic pain, DDD, Fibromyalgia, HTN, OA, SOB   PRECAUTIONS: None  SUBJECTIVE: Patient reports to PT with continued BIL knee pain  PAIN:  Are you having pain? Yes: NPRS scale: 8/10 Pain location: R with tightness in L Pain description: Constant dull ache in R knee with intermittent sharp pains. L knee feels tight and "funny" like she never got her full sensation back.  Aggravating factors: Squatting, walking, stairs, standing.  Relieving factors: Pain medication, heat, laying down.    OBJECTIVE: (objective measures completed at initial evaluation unless otherwise dated)   DIAGNOSTIC FINDINGS: None.    PATIENT SURVEYS:  FOTO 35%, 51% Predicted.  COGNITION:           Overall cognitive status: Within functional limits for tasks assessed               SENSATION: WFL   EDEMA:  Circumferential: 22" L, 22.5" Jt line   MUSCLE LENGTH: Hamstrings: Pt has tightness present in both legs with pain noted in R.    POSTURE: rounded shoulders, forward head, and anterior pelvic tilt   PALPATION: No tenderness on L knee, but tightness in L HS Lateral tenderness in lateral calf and VMO on R knee. Increased tension in lateral calf and HS.    LOWER EXTREMITY ROM:   Active/Passive ROM Right eval Left eval  Hip flexion      Hip extension      Hip abduction       Hip adduction      Hip internal rotation      Hip external rotation      Knee flexion 105/108 95/98  Knee extension 0 0  Ankle dorsiflexion      Ankle plantarflexion      Ankle inversion      Ankle eversion       (Blank rows = not tested)   LOWER EXTREMITY MMT:   MMT Right eval Left eval  Hip flexion Pt unable to get into position.  Pt unable to get into position.   Hip extension      Hip abduction 3+ 3+  Hip adduction      Hip internal rotation      Hip external rotation      Knee flexion 4- P! 4  Knee extension 4- P! 4   (Blank rows = not tested)     FUNCTIONAL TESTS:  5 times sit to stand: 19.84 sec Timed up and go (TUG): 18.53 sec                       SLS: L3 sec, R3 sec   GAIT: Distance walked: 79f Assistive device utilized: None Level of assistance: Complete Independence Comments: Pt walks with antalgic gait pattern.     TODAY'S TREATMENT: [facility] Adult PT Treatment:                                                DATE: 07/31/2022 Therapeutic Exercise: Nustep level 5 x 6 mins Standing hip abduction/extension x10 each BIL Standing marching x10 BIL Step ups 6" forward/lateral x10 each Lt Step ups 4" forward/lateral x10 each Lt Mini squats with BIL UE support 2x10 Seated marching 2x10 BIL Seated clamshells GTB 2x10 Seated adduction ball squeeze 5" hold 2x10 LAQ 2x10 BIL Seated hamstring curl GTB 2x10 BIL Seated hamstring stretch 2x30" BIL STS x10   07/25/2022: Creating, reviewing, and completing below HEP   PATIENT EDUCATION:  Education details: Educated pt on anatomy and physiology of current symptoms, questionnaire, diagnosis, prognosis, HEP,  and POC. Person educated: Patient Education method: Explanation and Demonstration Education comprehension: verbalized understanding and returned demonstration     HOME EXERCISE PROGRAM: Access Code: [email or web address]: [email or web address] Date: 07/25/2022 Prepared by: SRudi Heap   Exercises - Seated Long Arc Quad  - 2 x daily - 7 x weekly - 3 sets - 10 reps - 2 hold - Seated Hamstring Stretch  - 2 x daily - 7 x weekly - 2 sets -  30 hold - Seated Hip Adduction Isometrics with Ball  - 2 x daily - 7 x weekly - 2 sets - 10 reps - 5 hold   ASSESSMENT:   CLINICAL IMPRESSION: Patient presents to PT with continued high levels of BIL knee pain and reports HEP non-compliance at this time. Session today focused on BIL proximal hip and knee strengthening, particular focus on the quads. She had the most difficulty with step ups on the Rt LE due to pain and quad weakness. She demonstrates good participation throughout session, though is somewhat limited by fatigue , pain, and weakness. Patient was able to tolerate all prescribed exercises with no adverse effects. Patient continues to benefit from skilled PT services and should be progressed as able to improve functional independence.     OBJECTIVE IMPAIRMENTS Abnormal gait, decreased activity tolerance, decreased balance, decreased endurance, decreased mobility, difficulty walking, decreased ROM, decreased strength, impaired flexibility, impaired sensation, obesity, and pain.    ACTIVITY LIMITATIONS carrying, lifting, sitting, standing, squatting, sleeping, stairs, and transfers   PARTICIPATION LIMITATIONS: meal prep, cleaning, laundry, driving, and community activity   PERSONAL FACTORS Age, Past/current experiences, Time since onset of injury/illness/exacerbation, and 3+ comorbidities: Anxiety, Asthma, Chronic pain, DDD, Fibromyalgia, HTN, OA, SOB  are also affecting patient's functional outcome.    REHAB POTENTIAL: Good   CLINICAL DECISION MAKING: Stable/uncomplicated   EVALUATION COMPLEXITY: Low     GOALS: Goals reviewed with patient? No   SHORT TERM GOALS: Target date: 08/22/2022  Pt will be I and compliant with initial HEP. Baseline: provided at eval Goal status: INITIAL   2.  Pt will report </=5/10 pain at baseline   Baseline: 8/10 Goal status: INITIAL   LONG TERM GOALS: Target date: 09/19/2022    Pt will be independent with advanced HEP to continue to address postural limitations and muscle imbalances. Baseline:  Goal status: INITIAL   2.  Pt will report </=3/10 pain at baseline  Baseline: 8/10 Goal status: INITIAL   3.  Pt will improve FOTO function score to no less than 51% as proxy for functional improvement Baseline: 35% Goal status: INITIAL   4.  Pt will improve TUG by 3.4 seconds or Minimal clinically important difference.  Baseline: 18.53 sec Goal status: INITIAL   5.  Pt will improve her STS by 2.3 seconds for Minimal clinically important difference.  Baseline: 19.84 sec Goal status: INITIAL   6.  Pt will be able to stand for >10 sec in single leg stance, to show a significant improvement in balance in order to reduce fall risk  Baseline: L3 sec, R3 sec Goal status: INITIAL     PLAN: PT FREQUENCY: 1x/week   PT DURATION: 8 weeks   PLANNED INTERVENTIONS: Therapeutic exercises, Therapeutic activity, Neuromuscular re-education, Balance training, Gait training, Patient/Family education, Self Care, Joint mobilization, Joint manipulation, Stair training, Aquatic Therapy, Dry Needling, Electrical stimulation, Cryotherapy, Moist heat, Manual lymph drainage, scar mobilization, Traction, Ultrasound, Ionotophoresis '4mg'$ /ml Dexamethasone, and Manual therapy   PLAN FOR NEXT SESSION: Assess HEP/update PRN, continue to progress functional mobility, strengthen prox hip/ muscles. Decrease patients pain and help maximize functional mobility. Gait and stair training.     Margarette Canada, PTA 07/31/2022, 4:12 PM

## 2022-08-01 ENCOUNTER — Ambulatory Visit: Payer: Medicare Other

## 2022-08-07 ENCOUNTER — Ambulatory Visit: Payer: Medicare Other | Admitting: Orthopaedic Surgery

## 2022-08-08 ENCOUNTER — Ambulatory Visit: Payer: Medicare Other | Admitting: Physical Therapy

## 2022-08-08 ENCOUNTER — Encounter: Payer: Self-pay | Admitting: Physical Therapy

## 2022-08-08 DIAGNOSIS — G8929 Other chronic pain: Secondary | ICD-10-CM

## 2022-08-08 DIAGNOSIS — M25561 Pain in right knee: Secondary | ICD-10-CM | POA: Diagnosis not present

## 2022-08-08 DIAGNOSIS — M25662 Stiffness of left knee, not elsewhere classified: Secondary | ICD-10-CM

## 2022-08-08 DIAGNOSIS — M25661 Stiffness of right knee, not elsewhere classified: Secondary | ICD-10-CM

## 2022-08-08 NOTE — Therapy (Signed)
OUTPATIENT PHYSICAL THERAPY TREATMENT NOTE   Patient Name: [name] MRN: [id number] DOB:08-Sep-1961, 61 y.o., female Today's Date: 08/09/2022  PCP: Nolene Ebbs, MD REFERRING PROVIDER: Mcarthur Rossetti, MD  END OF SESSION:   PT End of Session - 08/08/22 1541     Visit Number 3    Number of Visits 16    Authorization Type [employer]    PT Start Time 8841    PT Stop Time 6606    PT Time Calculation (min) 45 min    Activity Tolerance Patient tolerated treatment well    Behavior During Therapy WFL for tasks assessed/performed              Past Medical History:  Diagnosis Date   Anxiety    Asthma    Back pain    Chronic pain    DDD (degenerative disc disease), lumbar    Degenerative disc disease    Degenerative disc disease, cervical    Degenerative disc disease, cervical    Fibroid    Fibromyalgia    Headache    regular   Heartburn    Hypertension    Joint pain    Localized swelling of both lower legs    Osteoarthritis    Other specified disorders of thyroid    Rheumatoid arthritis ([place])    Sleep apnea    mild, patient does not use mask   SOB (shortness of breath)    Past Surgical History:  Procedure Laterality Date   BACK SURGERY     JOINT REPLACEMENT     NOVASURE ABLATION     TOTAL KNEE ARTHROPLASTY Left 01/17/2017   Procedure: LEFT TOTAL KNEE ARTHROPLASTY;  Surgeon: Mcarthur Rossetti, MD;  Location: [facility];  Service: Orthopedics;  Laterality: Left;   TUBAL LIGATION     Patient Active Problem List   Diagnosis Date Noted   Visit for routine gyn exam 02/25/2022   Vitamin D deficiency 02/21/2021   Essential hypertension 02/21/2021   Pure hypercholesterolemia 02/21/2021   Class 3 severe obesity with serious comorbidity and body mass index (BMI) of 50.0 to 59.9 in adult [facility]) 06/13/2020   Prediabetes 06/13/2020   Leg pain 06/13/2020   Unilateral primary osteoarthritis, right knee 02/02/2020   Status post total left  knee replacement 01/17/2017   Unilateral primary osteoarthritis, left knee 12/05/2016   Degenerative disc disease, cervical     REFERRING DIAG: History of left knee replacement [Z96.652], Chronic pain of right knee [M25.561, G89.29]  THERAPY DIAG:  Chronic pain of right knee  Stiffness of right knee, not elsewhere classified  Stiffness of left knee, not elsewhere classified  Rationale for Evaluation and Treatment Rehabilitation  PERTINENT HISTORY: Anxiety, Asthma, Chronic pain, DDD, Fibromyalgia, HTN, OA, SOB   PRECAUTIONS: None  SUBJECTIVE: Pt continues to endorse L knee pain.  PAIN:  Are you having pain? Yes: NPRS scale: 8/10 Pain location: R with tightness in L Pain description: Constant dull ache in R knee with intermittent sharp pains. L knee feels tight and "funny" like she never got her full sensation back.  Aggravating factors: Squatting, walking, stairs, standing.  Relieving factors: Pain medication, heat, laying down.    OBJECTIVE: (objective measures completed at initial evaluation unless otherwise dated)   DIAGNOSTIC FINDINGS: None.    PATIENT SURVEYS:  FOTO 35%, 51% Predicted.    COGNITION:           Overall cognitive status: Within functional limits for tasks assessed  SENSATION: WFL   EDEMA:  Circumferential: 22" L, 22.5" Jt line   MUSCLE LENGTH: Hamstrings: Pt has tightness present in both legs with pain noted in R.    POSTURE: rounded shoulders, forward head, and anterior pelvic tilt   PALPATION: No tenderness on L knee, but tightness in L HS Lateral tenderness in lateral calf and VMO on R knee. Increased tension in lateral calf and HS.    LOWER EXTREMITY ROM:   Active/Passive ROM Right eval Left eval  Hip flexion      Hip extension      Hip abduction      Hip adduction      Hip internal rotation      Hip external rotation      Knee flexion 105/108 95/98  Knee extension 0 0  Ankle dorsiflexion      Ankle plantarflexion       Ankle inversion      Ankle eversion       (Blank rows = not tested)   LOWER EXTREMITY MMT:   MMT Right eval Left eval  Hip flexion Pt unable to get into position.  Pt unable to get into position.   Hip extension      Hip abduction 3+ 3+  Hip adduction      Hip internal rotation      Hip external rotation      Knee flexion 4- P! 4  Knee extension 4- P! 4   (Blank rows = not tested)     FUNCTIONAL TESTS:  5 times sit to stand: 19.84 sec Timed up and go (TUG): 18.53 sec                       SLS: L3 sec, R3 sec   GAIT: Distance walked: 50f Assistive device utilized: None Level of assistance: Complete Independence Comments: Pt walks with antalgic gait pattern.     TODAY'S TREATMENT:  TREATMENT 08/08/22:  [address] [address] therapeutic pool temp 92 degrees Pt enters building independently.  Treatment took place in water 3.8 to  4 ft 8 in.feet deep depending upon activity.  Pt entered and exited the pool via stair and handrails   Pt pain level 8 at initiation of water walking.   Aquatic Therapy:  Therapeutic Exercise: Walking forward/backwards/side stepping HS curl Hip abd Mini squats Heel raises Toe raises Runners stretch on bottom step x30" BIL Hamstring stretch on bottom step x30" BIL LAQ Flutter kicks Lateral flutter kicks STS from 3rd step from bottom   Pt requires the buoyancy of water for active assisted exercises with buoyancy supported for strengthening and AROM exercises. Hydrostatic pressure also supports joints by unweighting joint load by at least 50 % in 3-4 feet depth water. 80% in chest to neck deep water. Water will provide assistance with movement using the current and laminar flow while the buoyancy reduces weight bearing. Pt requires the viscosity of the water for resistance with strengthening exercises.    [facility]                                                [facility]:  07/31/2022 Therapeutic Exercise: Nustep level 5 x 6 mins Standing hip abduction/extension x10 each BIL Standing marching x10 BIL Step ups 6" forward/lateral x10 each Lt Step ups  4" forward/lateral x10 each Lt Mini squats with BIL UE support 2x10 Seated marching 2x10 BIL Seated clamshells GTB 2x10 Seated adduction ball squeeze 5" hold 2x10 LAQ 2x10 BIL Seated hamstring curl GTB 2x10 BIL Seated hamstring stretch 2x30" BIL STS x10   07/25/2022: Creating, reviewing, and completing below HEP   PATIENT EDUCATION:  Education details: Educated pt on anatomy and physiology of current symptoms, questionnaire, diagnosis, prognosis, HEP,  and POC. Person educated: Patient Education method: Explanation and Demonstration Education comprehension: verbalized understanding and returned demonstration     HOME EXERCISE PROGRAM: Access Code: [email or web address] URL: [email or web address] Date: 07/25/2022 Prepared by: Rudi Heap   Exercises - Seated Long Arc Quad  - 2 x daily - 7 x weekly - 3 sets - 10 reps - 2 hold - Seated Hamstring Stretch  - 2 x daily - 7 x weekly - 2 sets - 30 hold - Seated Hip Adduction Isometrics with Ball  - 2 x daily - 7 x weekly - 2 sets - 10 reps - 5 hold   ASSESSMENT:   CLINICAL IMPRESSION: Patient was able to tolerate all prescribed exercises in the aquatic environment with no adverse effects and reports 7/10 pain at the end of the session. Patient continues to benefit from skilled PT services on land and aquatic based and should be progressed as able to improve functional independence.     OBJECTIVE IMPAIRMENTS Abnormal gait, decreased activity tolerance, decreased balance, decreased endurance, decreased mobility, difficulty walking, decreased ROM, decreased strength, impaired flexibility, impaired sensation, obesity, and pain.    ACTIVITY LIMITATIONS carrying, lifting, sitting, standing, squatting, sleeping, stairs, and transfers   PARTICIPATION  LIMITATIONS: meal prep, cleaning, laundry, driving, and community activity   PERSONAL FACTORS Age, Past/current experiences, Time since onset of injury/illness/exacerbation, and 3+ comorbidities: Anxiety, Asthma, Chronic pain, DDD, Fibromyalgia, HTN, OA, SOB  are also affecting patient's functional outcome.    REHAB POTENTIAL: Good   CLINICAL DECISION MAKING: Stable/uncomplicated   EVALUATION COMPLEXITY: Low     GOALS: Goals reviewed with patient? No   SHORT TERM GOALS: Target date: 08/22/2022  Pt will be I and compliant with initial HEP. Baseline: provided at eval Goal status: INITIAL   2.  Pt will report </=5/10 pain at baseline  Baseline: 8/10 Goal status: INITIAL   LONG TERM GOALS: Target date: 09/19/2022    Pt will be independent with advanced HEP to continue to address postural limitations and muscle imbalances. Baseline:  Goal status: INITIAL   2.  Pt will report </=3/10 pain at baseline  Baseline: 8/10 Goal status: INITIAL   3.  Pt will improve FOTO function score to no less than 51% as proxy for functional improvement Baseline: 35% Goal status: INITIAL   4.  Pt will improve TUG by 3.4 seconds or Minimal clinically important difference.  Baseline: 18.53 sec Goal status: INITIAL   5.  Pt will improve her STS by 2.3 seconds for Minimal clinically important difference.  Baseline: 19.84 sec Goal status: INITIAL   6.  Pt will be able to stand for >10 sec in single leg stance, to show a significant improvement in balance in order to reduce fall risk  Baseline: L3 sec, R3 sec Goal status: INITIAL     PLAN: PT FREQUENCY: 1x/week   PT DURATION: 8 weeks   PLANNED INTERVENTIONS: Therapeutic exercises, Therapeutic activity, Neuromuscular re-education, Balance training, Gait training, Patient/Family education, Self Care, Joint mobilization, Joint manipulation, Stair training, Aquatic Therapy, Dry Needling, Electrical stimulation, Cryotherapy, Moist heat, Manual lymph  drainage, scar mobilization, Traction, Ultrasound, Ionotophoresis '4mg'$ /ml Dexamethasone, and Manual therapy   PLAN FOR NEXT SESSION: Assess HEP/update PRN, continue to progress functional mobility, strengthen prox hip/ muscles. Decrease patients pain and help maximize functional mobility. Gait and stair training.     Mathis Dad, PT 08/09/2022, 7:46 AM

## 2022-08-15 ENCOUNTER — Ambulatory Visit: Payer: Medicare Other

## 2022-08-15 DIAGNOSIS — R262 Difficulty in walking, not elsewhere classified: Secondary | ICD-10-CM

## 2022-08-15 DIAGNOSIS — M25561 Pain in right knee: Secondary | ICD-10-CM | POA: Diagnosis not present

## 2022-08-15 DIAGNOSIS — M25662 Stiffness of left knee, not elsewhere classified: Secondary | ICD-10-CM

## 2022-08-15 DIAGNOSIS — M6281 Muscle weakness (generalized): Secondary | ICD-10-CM

## 2022-08-15 DIAGNOSIS — R2689 Other abnormalities of gait and mobility: Secondary | ICD-10-CM

## 2022-08-15 DIAGNOSIS — M25661 Stiffness of right knee, not elsewhere classified: Secondary | ICD-10-CM

## 2022-08-15 DIAGNOSIS — G8929 Other chronic pain: Secondary | ICD-10-CM

## 2022-08-15 NOTE — Therapy (Signed)
OUTPATIENT PHYSICAL THERAPY TREATMENT NOTE   Patient Name: HILA BOLDING MRN: [id number] DOB:02-28-1961, 61 y.o., female Today's Date: 08/15/2022  PCP: Nolene Ebbs, MD REFERRING PROVIDER: Mcarthur Rossetti, MD  END OF SESSION:   PT End of Session - 08/15/22 1120     Visit Number 4    Number of Visits 16    Authorization Type [employer]    PT Start Time 1122    PT Stop Time 1202    PT Time Calculation (min) 40 min    Activity Tolerance Patient tolerated treatment well    Behavior During Therapy WFL for tasks assessed/performed               Past Medical History:  Diagnosis Date   Anxiety    Asthma    Back pain    Chronic pain    DDD (degenerative disc disease), lumbar    Degenerative disc disease    Degenerative disc disease, cervical    Degenerative disc disease, cervical    Fibroid    Fibromyalgia    Headache    regular   Heartburn    Hypertension    Joint pain    Localized swelling of both lower legs    Osteoarthritis    Other specified disorders of thyroid    Rheumatoid arthritis ([place])    Sleep apnea    mild, patient does not use mask   SOB (shortness of breath)    Past Surgical History:  Procedure Laterality Date   BACK SURGERY     JOINT REPLACEMENT     NOVASURE ABLATION     TOTAL KNEE ARTHROPLASTY Left 01/17/2017   Procedure: LEFT TOTAL KNEE ARTHROPLASTY;  Surgeon: Mcarthur Rossetti, MD;  Location: [facility];  Service: Orthopedics;  Laterality: Left;   TUBAL LIGATION     Patient Active Problem List   Diagnosis Date Noted   Visit for routine gyn exam 02/25/2022   Vitamin D deficiency 02/21/2021   Essential hypertension 02/21/2021   Pure hypercholesterolemia 02/21/2021   Class 3 severe obesity with serious comorbidity and body mass index (BMI) of 50.0 to 59.9 in adult [facility]) 06/13/2020   Prediabetes 06/13/2020   Leg pain 06/13/2020   Unilateral primary osteoarthritis, right knee 02/02/2020   Status post total  left knee replacement 01/17/2017   Unilateral primary osteoarthritis, left knee 12/05/2016   Degenerative disc disease, cervical     REFERRING DIAG: History of left knee replacement [Z96.652], Chronic pain of right knee [M25.561, G89.29]  THERAPY DIAG:  Chronic pain of right knee  Stiffness of right knee, not elsewhere classified  Stiffness of left knee, not elsewhere classified  Muscle weakness (generalized)  Other abnormalities of gait and mobility  Difficulty in walking, not elsewhere classified  Rationale for Evaluation and Treatment Rehabilitation  PERTINENT HISTORY: Anxiety, Asthma, Chronic pain, DDD, Fibromyalgia, HTN, OA, SOB   PRECAUTIONS: None  SUBJECTIVE: Patient reports more tightness than pain in BIL knees, stating they are equal in pain/tightness today.   PAIN:  Are you having pain? Yes: NPRS scale: 7/10 Pain location: R with tightness in L Pain description: Constant dull ache in R knee with intermittent sharp pains. L knee feels tight and "funny" like she never got her full sensation back.  Aggravating factors: Squatting, walking, stairs, standing.  Relieving factors: Pain medication, heat, laying down.    OBJECTIVE: (objective measures completed at initial evaluation unless otherwise dated)   DIAGNOSTIC FINDINGS: None.    PATIENT SURVEYS:  FOTO 35%,  51% Predicted.    COGNITION:           Overall cognitive status: Within functional limits for tasks assessed               SENSATION: WFL   EDEMA:  Circumferential: 22" L, 22.5" Jt line   MUSCLE LENGTH: Hamstrings: Pt has tightness present in both legs with pain noted in R.    POSTURE: rounded shoulders, forward head, and anterior pelvic tilt   PALPATION: No tenderness on L knee, but tightness in L HS Lateral tenderness in lateral calf and VMO on R knee. Increased tension in lateral calf and HS.    LOWER EXTREMITY ROM:   Active/Passive ROM Right eval Left eval  Hip flexion      Hip  extension      Hip abduction      Hip adduction      Hip internal rotation      Hip external rotation      Knee flexion 105/108 95/98  Knee extension 0 0  Ankle dorsiflexion      Ankle plantarflexion      Ankle inversion      Ankle eversion       (Blank rows = not tested)   LOWER EXTREMITY MMT:   MMT Right eval Left eval  Hip flexion Pt unable to get into position.  Pt unable to get into position.   Hip extension      Hip abduction 3+ 3+  Hip adduction      Hip internal rotation      Hip external rotation      Knee flexion 4- P! 4  Knee extension 4- P! 4   (Blank rows = not tested)     FUNCTIONAL TESTS:  5 times sit to stand: 19.84 sec Timed up and go (TUG): 18.53 sec                       SLS: L3 sec, R3 sec   GAIT: Distance walked: 70f Assistive device utilized: None Level of assistance: Complete Independence Comments: Pt walks with antalgic gait pattern.     TODAY'S TREATMENT: [facility] Adult PT Treatment:                                                DATE: 08/15/2022 Therapeutic Exercise: Nustep level 5 x 6 mins Standing hip abduction/extension 2x10 each BIL Standing marching 2x10 BIL Standing hamstring curl 2x10 BIL Step ups 4" forward/lateral x10 each BIL (more difficult leading with R) Heel/toe raises 2x10 Mini squats with BIL UE support 2x10 Seated marching 2x10 BIL Seated clamshells GTB 2x10 Seated adduction ball squeeze 5" hold 2x10 LAQ 2x10 BIL Seated hamstring curl GTB 2x10 BIL Seated hamstring stretch 2x30" BIL STS 2x10 arms crossed  TREATMENT 08/08/22:  Aquatic therapy at [address] - therapeutic pool temp 92 degrees Pt enters building independently.  Treatment took place in water 3.8 to  4 ft 8 in.feet deep depending upon activity.  Pt entered and exited the pool via stair and handrails   Pt pain level 8 at initiation of water walking.   Aquatic Therapy:  Therapeutic Exercise: Walking forward/backwards/side stepping HS  curl Hip abd Mini squats Heel raises Toe raises Runners stretch on bottom step x30" BIL Hamstring stretch on bottom step x30" BIL LAQ  Flutter kicks Lateral flutter kicks STS from 3rd step from bottom   Pt requires the buoyancy of water for active assisted exercises with buoyancy supported for strengthening and AROM exercises. Hydrostatic pressure also supports joints by unweighting joint load by at least 50 % in 3-4 feet depth water. 80% in chest to neck deep water. Water will provide assistance with movement using the current and laminar flow while the buoyancy reduces weight bearing. Pt requires the viscosity of the water for resistance with strengthening exercises.    [facility] Adult PT Treatment:                                                DATE: 07/31/2022 Therapeutic Exercise: Nustep level 5 x 6 mins Standing hip abduction/extension x10 each BIL Standing marching x10 BIL Step ups 6" forward/lateral x10 each Lt Step ups 4" forward/lateral x10 each Lt Mini squats with BIL UE support 2x10 Seated marching 2x10 BIL Seated clamshells GTB 2x10 Seated adduction ball squeeze 5" hold 2x10 LAQ 2x10 BIL Seated hamstring curl GTB 2x10 BIL Seated hamstring stretch 2x30" BIL STS x10    PATIENT EDUCATION:  Education details: Educated pt on anatomy and physiology of current symptoms, questionnaire, diagnosis, prognosis, HEP,  and POC. Person educated: Patient Education method: Explanation and Demonstration Education comprehension: verbalized understanding and returned demonstration     HOME EXERCISE PROGRAM: Access Code: [email or web address] URL: [email or web address] Date: 07/25/2022 Prepared by: Rudi Heap   Exercises - Seated Long Arc Quad  - 2 x daily - 7 x weekly - 3 sets - 10 reps - 2 hold - Seated Hamstring Stretch  - 2 x daily - 7 x weekly - 2 sets - 30 hold - Seated Hip Adduction Isometrics with Ball  - 2 x daily - 7 x weekly - 2 sets - 10 reps - 5 hold    ASSESSMENT:   CLINICAL IMPRESSION: Patient presents to PT with pain and tightness in BIL knees and reports she did not enjoy the aquatic session due to her high anxiety and would like to stick with land visits going forward. Increased repetitions of standing exercises today with good effect and no increase in pain. Patient was able to tolerate all prescribed exercises with no adverse effects. Patient continues to benefit from skilled PT services and should be progressed as able to improve functional independence.    OBJECTIVE IMPAIRMENTS Abnormal gait, decreased activity tolerance, decreased balance, decreased endurance, decreased mobility, difficulty walking, decreased ROM, decreased strength, impaired flexibility, impaired sensation, obesity, and pain.    ACTIVITY LIMITATIONS carrying, lifting, sitting, standing, squatting, sleeping, stairs, and transfers   PARTICIPATION LIMITATIONS: meal prep, cleaning, laundry, driving, and community activity   PERSONAL FACTORS Age, Past/current experiences, Time since onset of injury/illness/exacerbation, and 3+ comorbidities: Anxiety, Asthma, Chronic pain, DDD, Fibromyalgia, HTN, OA, SOB  are also affecting patient's functional outcome.    REHAB POTENTIAL: Good   CLINICAL DECISION MAKING: Stable/uncomplicated   EVALUATION COMPLEXITY: Low     GOALS: Goals reviewed with patient? No   SHORT TERM GOALS: Target date: 08/22/2022  Pt will be I and compliant with initial HEP. Baseline: provided at eval Goal status: INITIAL   2.  Pt will report </=5/10 pain at baseline  Baseline: 8/10 Goal status: INITIAL   LONG TERM GOALS: Target date: 09/19/2022    Pt will be independent with advanced HEP  to continue to address postural limitations and muscle imbalances. Baseline:  Goal status: INITIAL   2.  Pt will report </=3/10 pain at baseline  Baseline: 8/10 Goal status: INITIAL   3.  Pt will improve FOTO function score to no less than 51% as proxy for  functional improvement Baseline: 35% Goal status: INITIAL   4.  Pt will improve TUG by 3.4 seconds or Minimal clinically important difference.  Baseline: 18.53 sec Goal status: INITIAL   5.  Pt will improve her STS by 2.3 seconds for Minimal clinically important difference.  Baseline: 19.84 sec Goal status: INITIAL   6.  Pt will be able to stand for >10 sec in single leg stance, to show a significant improvement in balance in order to reduce fall risk  Baseline: L3 sec, R3 sec Goal status: INITIAL     PLAN: PT FREQUENCY: 1x/week   PT DURATION: 8 weeks   PLANNED INTERVENTIONS: Therapeutic exercises, Therapeutic activity, Neuromuscular re-education, Balance training, Gait training, Patient/Family education, Self Care, Joint mobilization, Joint manipulation, Stair training, Aquatic Therapy, Dry Needling, Electrical stimulation, Cryotherapy, Moist heat, Manual lymph drainage, scar mobilization, Traction, Ultrasound, Ionotophoresis '4mg'$ /ml Dexamethasone, and Manual therapy   PLAN FOR NEXT SESSION: Assess HEP/update PRN, continue to progress functional mobility, strengthen prox hip/ muscles. Decrease patients pain and help maximize functional mobility. Gait and stair training.     Margarette Canada, PTA 08/15/2022, 12:03 PM

## 2022-08-21 ENCOUNTER — Ambulatory Visit: Payer: Medicare Other

## 2022-08-21 ENCOUNTER — Ambulatory Visit: Payer: Medicare Other | Admitting: Physical Therapy

## 2022-08-22 ENCOUNTER — Ambulatory Visit: Payer: Medicare Other | Admitting: Physical Therapy

## 2022-08-28 ENCOUNTER — Ambulatory Visit: Payer: Medicare Other | Attending: Orthopaedic Surgery

## 2022-08-28 DIAGNOSIS — M25561 Pain in right knee: Secondary | ICD-10-CM | POA: Diagnosis present

## 2022-08-28 DIAGNOSIS — M25661 Stiffness of right knee, not elsewhere classified: Secondary | ICD-10-CM | POA: Diagnosis present

## 2022-08-28 DIAGNOSIS — M25562 Pain in left knee: Secondary | ICD-10-CM | POA: Insufficient documentation

## 2022-08-28 DIAGNOSIS — G8929 Other chronic pain: Secondary | ICD-10-CM | POA: Insufficient documentation

## 2022-08-28 DIAGNOSIS — R2689 Other abnormalities of gait and mobility: Secondary | ICD-10-CM | POA: Diagnosis present

## 2022-08-28 DIAGNOSIS — R262 Difficulty in walking, not elsewhere classified: Secondary | ICD-10-CM | POA: Diagnosis present

## 2022-08-28 DIAGNOSIS — M6281 Muscle weakness (generalized): Secondary | ICD-10-CM | POA: Insufficient documentation

## 2022-08-28 DIAGNOSIS — M25662 Stiffness of left knee, not elsewhere classified: Secondary | ICD-10-CM | POA: Diagnosis present

## 2022-08-28 NOTE — Therapy (Addendum)
OUTPATIENT PHYSICAL THERAPY TREATMENT NOTE   Patient Name: [name] MRN: [id number] DOB:07/23/1961, 61 y.o., female Today's Date: 08/28/2022  PCP: Nolene Ebbs, MD REFERRING PROVIDER: Mcarthur Rossetti, MD  END OF SESSION:   PT End of Session - 08/28/22 1526     Visit Number 5    Number of Visits 16    Date for PT Re-Evaluation 09/26/22    Authorization Type [employer]    PT Start Time 1525    PT Stop Time 1600    PT Time Calculation (min) 35 min    Activity Tolerance Patient tolerated treatment well    Behavior During Therapy WFL for tasks assessed/performed              Past Medical History:  Diagnosis Date   Anxiety    Asthma    Back pain    Chronic pain    DDD (degenerative disc disease), lumbar    Degenerative disc disease    Degenerative disc disease, cervical    Degenerative disc disease, cervical    Fibroid    Fibromyalgia    Headache    regular   Heartburn    Hypertension    Joint pain    Localized swelling of both lower legs    Osteoarthritis    Other specified disorders of thyroid    Rheumatoid arthritis ([place])    Sleep apnea    mild, patient does not use mask   SOB (shortness of breath)    Past Surgical History:  Procedure Laterality Date   BACK SURGERY     JOINT REPLACEMENT     NOVASURE ABLATION     TOTAL KNEE ARTHROPLASTY Left 01/17/2017   Procedure: LEFT TOTAL KNEE ARTHROPLASTY;  Surgeon: Mcarthur Rossetti, MD;  Location: [facility];  Service: Orthopedics;  Laterality: Left;   TUBAL LIGATION     Patient Active Problem List   Diagnosis Date Noted   Visit for routine gyn exam 02/25/2022   Vitamin D deficiency 02/21/2021   Essential hypertension 02/21/2021   Pure hypercholesterolemia 02/21/2021   Class 3 severe obesity with serious comorbidity and body mass index (BMI) of 50.0 to 59.9 in adult [facility]) 06/13/2020   Prediabetes 06/13/2020   Leg pain 06/13/2020   Unilateral primary osteoarthritis, right  knee 02/02/2020   Status post total left knee replacement 01/17/2017   Unilateral primary osteoarthritis, left knee 12/05/2016   Degenerative disc disease, cervical     REFERRING DIAG: History of left knee replacement [Z96.652], Chronic pain of right knee [M25.561, G89.29]  THERAPY DIAG:  Chronic pain of right knee  Stiffness of right knee, not elsewhere classified  Stiffness of left knee, not elsewhere classified  Muscle weakness (generalized)  Other abnormalities of gait and mobility  Difficulty in walking, not elsewhere classified  Chronic pain of left knee  Rationale for Evaluation and Treatment Rehabilitation  PERTINENT HISTORY: Anxiety, Asthma, Chronic pain, DDD, Fibromyalgia, HTN, OA, SOB   PRECAUTIONS: None  SUBJECTIVE: Patient reports her asthma has been acting up for a couple of weeks, but that her knees are feeling better today.  PAIN:  Are you having pain? Yes: NPRS scale: 7/10 Pain location: R with tightness in L Pain description: Constant dull ache in R knee with intermittent sharp pains. L knee feels tight and "funny" like she never got her full sensation back.  Aggravating factors: Squatting, walking, stairs, standing.  Relieving factors: Pain medication, heat, laying down.    OBJECTIVE: (objective measures completed at initial  evaluation unless otherwise dated)   DIAGNOSTIC FINDINGS: None.    PATIENT SURVEYS:  FOTO 35%, 51% Predicted.    COGNITION:           Overall cognitive status: Within functional limits for tasks assessed               SENSATION: WFL   EDEMA:  Circumferential: 22" L, 22.5" Jt line   MUSCLE LENGTH: Hamstrings: Pt has tightness present in both legs with pain noted in R.    POSTURE: rounded shoulders, forward head, and anterior pelvic tilt   PALPATION: No tenderness on L knee, but tightness in L HS Lateral tenderness in lateral calf and VMO on R knee. Increased tension in lateral calf and HS.    LOWER EXTREMITY ROM:    Active/Passive ROM Right eval Left eval  Hip flexion      Hip extension      Hip abduction      Hip adduction      Hip internal rotation      Hip external rotation      Knee flexion 105/108 95/98  Knee extension 0 0  Ankle dorsiflexion      Ankle plantarflexion      Ankle inversion      Ankle eversion       (Blank rows = not tested)   LOWER EXTREMITY MMT:   MMT Right eval Left eval  Hip flexion Pt unable to get into position.  Pt unable to get into position.   Hip extension      Hip abduction 3+ 3+  Hip adduction      Hip internal rotation      Hip external rotation      Knee flexion 4- P! 4  Knee extension 4- P! 4   (Blank rows = not tested)     FUNCTIONAL TESTS:  5 times sit to stand: 19.84 sec Timed up and go (TUG): 18.53 sec                       SLS: L3 sec, R3 sec   GAIT: Distance walked: 40f Assistive device utilized: None Level of assistance: Complete Independence Comments: Pt walks with antalgic gait pattern.     TODAY'S TREATMENT: [facility] Adult PT Treatment:                                                DATE: 08/28/22 Therapeutic Exercise: Nustep level 5 x 6 mins Standing hip abduction/extension x10 each BIL Standing marching x10 BIL Standing hamstring curl x10 BIL During standing exercises pt became dizzy, seated BP: 146/88 HR 64 bpm Seated marching 2x10 BIL Seated clamshells GTB 3x10 Seated adduction ball squeeze 5" hold 2x10 LAQ 2x10 BIL Seated hamstring curl GTB 2x10 BIL Seated hamstring stretch 2x30" BIL  [facility] Adult PT Treatment:                                                DATE: 08/15/2022 Therapeutic Exercise: Nustep level 5 x 6 mins Standing hip abduction/extension 2x10 each BIL Standing marching 2x10 BIL Standing hamstring curl 2x10 BIL Step ups 4" forward/lateral x10 each BIL (more difficult leading with R) Heel/toe raises 2x10 Mini  squats with BIL UE support 2x10 Seated marching 2x10 BIL Seated clamshells GTB 2x10 Seated  adduction ball squeeze 5" hold 2x10 LAQ 2x10 BIL Seated hamstring curl GTB 2x10 BIL Seated hamstring stretch 2x30" BIL STS 2x10 arms crossed  TREATMENT 08/08/22:  Aquatic therapy at [address] - therapeutic pool temp 92 degrees Pt enters building independently.  Treatment took place in water 3.8 to  4 ft 8 in.feet deep depending upon activity.  Pt entered and exited the pool via stair and handrails   Pt pain level 8 at initiation of water walking.   Aquatic Therapy:  Therapeutic Exercise: Walking forward/backwards/side stepping HS curl Hip abd Mini squats Heel raises Toe raises Runners stretch on bottom step x30" BIL Hamstring stretch on bottom step x30" BIL LAQ Flutter kicks Lateral flutter kicks STS from 3rd step from bottom   Pt requires the buoyancy of water for active assisted exercises with buoyancy supported for strengthening and AROM exercises. Hydrostatic pressure also supports joints by unweighting joint load by at least 50 % in 3-4 feet depth water. 80% in chest to neck deep water. Water will provide assistance with movement using the current and laminar flow while the buoyancy reduces weight bearing. Pt requires the viscosity of the water for resistance with strengthening exercises.    PATIENT EDUCATION:  Education details: Educated pt on anatomy and physiology of current symptoms, questionnaire, diagnosis, prognosis, HEP,  and POC. Person educated: Patient Education method: Explanation and Demonstration Education comprehension: verbalized understanding and returned demonstration     HOME EXERCISE PROGRAM: Access Code: [email or web address] URL: [email or web address] Date: 07/25/2022 Prepared by: Rudi Heap   Exercises - Seated Long Arc Quad  - 2 x daily - 7 x weekly - 3 sets - 10 reps - 2 hold - Seated Hamstring Stretch  - 2 x daily - 7 x weekly - 2 sets - 30 hold - Seated Hip Adduction Isometrics with Ball  - 2 x daily - 7 x weekly -  2 sets - 10 reps - 5 hold   ASSESSMENT:   CLINICAL IMPRESSION: Patient presents to PT with decreased knee pain today but reports her asthma has been giving her trouble recently. She reports she is overall not feeling well today, but wanted to continue with therapy. During standing exercises she reported dizziness and sat down to rest. Vitals taken, BP systolic elevated and pt reports she has been taking her BP medication. Remaining session performed sitting down with good tolerance. Patient continues to benefit from skilled PT services and should be progressed as able to improve functional independence.     OBJECTIVE IMPAIRMENTS Abnormal gait, decreased activity tolerance, decreased balance, decreased endurance, decreased mobility, difficulty walking, decreased ROM, decreased strength, impaired flexibility, impaired sensation, obesity, and pain.    ACTIVITY LIMITATIONS carrying, lifting, sitting, standing, squatting, sleeping, stairs, and transfers   PARTICIPATION LIMITATIONS: meal prep, cleaning, laundry, driving, and community activity   PERSONAL FACTORS Age, Past/current experiences, Time since onset of injury/illness/exacerbation, and 3+ comorbidities: Anxiety, Asthma, Chronic pain, DDD, Fibromyalgia, HTN, OA, SOB  are also affecting patient's functional outcome.    REHAB POTENTIAL: Good   CLINICAL DECISION MAKING: Stable/uncomplicated   EVALUATION COMPLEXITY: Low     GOALS: Goals reviewed with patient? No   SHORT TERM GOALS: Target date: 08/22/2022  Pt will be I and compliant with initial HEP. Baseline: provided at eval Goal status: MET Pt reports adherence 08/28/22   2.  Pt will report </=5/10 pain at baseline  Baseline: 8/10  Goal status: Ongoing Baseline 08/28/22: 7/10   LONG TERM GOALS: Target date: 09/19/2022    Pt will be independent with advanced HEP to continue to address postural limitations and muscle imbalances. Baseline:  Goal status: INITIAL   2.  Pt will report  </=3/10 pain at baseline  Baseline: 8/10 Goal status: INITIAL   3.  Pt will improve FOTO function score to no less than 51% as proxy for functional improvement Baseline: 35% Goal status: INITIAL   4.  Pt will improve TUG by 3.4 seconds or Minimal clinically important difference.  Baseline: 18.53 sec Goal status: INITIAL   5.  Pt will improve her STS by 2.3 seconds for Minimal clinically important difference.  Baseline: 19.84 sec Goal status: INITIAL   6.  Pt will be able to stand for >10 sec in single leg stance, to show a significant improvement in balance in order to reduce fall risk  Baseline: L3 sec, R3 sec Goal status: INITIAL     PLAN: PT FREQUENCY: 1x/week   PT DURATION: 8 weeks   PLANNED INTERVENTIONS: Therapeutic exercises, Therapeutic activity, Neuromuscular re-education, Balance training, Gait training, Patient/Family education, Self Care, Joint mobilization, Joint manipulation, Stair training, Aquatic Therapy, Dry Needling, Electrical stimulation, Cryotherapy, Moist heat, Manual lymph drainage, scar mobilization, Traction, Ultrasound, Ionotophoresis 53m/ml Dexamethasone, and Manual therapy   PLAN FOR NEXT SESSION: Assess HEP/update PRN, continue to progress functional mobility, strengthen prox hip/ muscles. Decrease patients pain and help maximize functional mobility. Gait and stair training.     SMargarette Canada PTA 08/28/2022, 3:59 PM  PHYSICAL THERAPY DISCHARGE SUMMARY  Visits from Start of Care: 5  Current functional level related to goals / functional outcomes: N/A, Pt was seen by other PT's/ PTA's since his initial eval.    Remaining deficits: N/A, Pt was seen by other PT's/ PTA's since his initial eval.    Education / Equipment: Theraband    Patient agrees to discharge. Patient goals were not met. Patient is being discharged due to not returning since the last visit.

## 2022-09-05 ENCOUNTER — Ambulatory Visit: Payer: Medicare Other

## 2022-09-12 ENCOUNTER — Ambulatory Visit: Payer: Medicare Other

## 2022-09-19 ENCOUNTER — Ambulatory Visit: Payer: Medicare Other

## 2022-09-26 ENCOUNTER — Other Ambulatory Visit: Payer: Self-pay | Admitting: Internal Medicine

## 2022-09-26 ENCOUNTER — Ambulatory Visit: Payer: Medicare Other

## 2022-09-26 DIAGNOSIS — Z1231 Encounter for screening mammogram for malignant neoplasm of breast: Secondary | ICD-10-CM

## 2022-10-14 ENCOUNTER — Ambulatory Visit (INDEPENDENT_AMBULATORY_CARE_PROVIDER_SITE_OTHER): Payer: Medicare Other | Admitting: Physician Assistant

## 2022-10-14 ENCOUNTER — Encounter: Payer: Self-pay | Admitting: Physician Assistant

## 2022-10-14 VITALS — Ht 67.0 in | Wt 331.0 lb

## 2022-10-14 DIAGNOSIS — M1711 Unilateral primary osteoarthritis, right knee: Secondary | ICD-10-CM

## 2022-10-14 MED ORDER — LIDOCAINE HCL 1 % IJ SOLN
3.0000 mL | INTRAMUSCULAR | Status: AC | PRN
  Administered 2022-10-14: 3 mL

## 2022-10-14 MED ORDER — METHYLPREDNISOLONE ACETATE 40 MG/ML IJ SUSP
40.0000 mg | INTRAMUSCULAR | Status: AC | PRN
  Administered 2022-10-14: 40 mg via INTRA_ARTICULAR

## 2022-10-14 NOTE — Progress Notes (Signed)
   Procedure Note  Patient: [name]             Date of Birth: [birth year]           MRN: [id number]             Visit Date: 10/14/2022 HPI: Mrs. Larranaga comes in today due to right knee pain.  She is had no injury to the right knee.  She notes that ibuprofen does help with the pain.  She is prediabetic but reports her numbers overall are good and repeat regards to her prediabetes.  Said no fevers chills.  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  Last cortisone injection right knee was 03/18/2022 and lasted until a month ago.  Review of systems: See HPI otherwise negative Physical exam: Height 5 foot 7 weight 331.8 pounds BMI 51.83 General: Well-developed well-nourished female no acute distress. Bilateral knees: Good range of motion of both knees.  Varus malalignment right knee.  Tenderness medial joint line of the right knee.  Left knee well-healed surgical incision.  No instability of either knee with valgus varus stressing.  No abnormal warmth or erythema of either knee.  Procedures: Visit Diagnoses:  1. Primary osteoarthritis of right knee     Large Joint Inj: R knee on 10/14/2022 4:29 PM Indications: pain Details: 22 G 1.5 in needle, anterolateral approach  Arthrogram: No  Medications: 3 mL lidocaine 1 %; 40 mg methylPREDNISolone acetate 40 MG/ML Outcome: tolerated well, no immediate complications Procedure, treatment alternatives, risks and benefits explained, specific risks discussed. Consent was given by the patient. Immediately prior to procedure a time out was called to verify the correct patient, procedure, equipment, support staff and site/side marked as required. Patient was prepped and draped in the usual sterile fashion.    Plan: She will continue to work on range of motion strengthening both knees.  She knows to wait least 3 months between injections.  Questions were encouraged and answered.

## 2022-10-16 ENCOUNTER — Ambulatory Visit (INDEPENDENT_AMBULATORY_CARE_PROVIDER_SITE_OTHER): Payer: Medicare Other | Admitting: Plastic Surgery

## 2022-10-16 ENCOUNTER — Encounter: Payer: Self-pay | Admitting: Plastic Surgery

## 2022-10-16 VITALS — BP 147/74 | HR 73 | Wt 328.8 lb

## 2022-10-16 DIAGNOSIS — L989 Disorder of the skin and subcutaneous tissue, unspecified: Secondary | ICD-10-CM | POA: Diagnosis not present

## 2022-10-16 NOTE — Progress Notes (Signed)
Referring Provider Nolene Ebbs, MD [address] [place],  [place] [address]   CC:  Chief Complaint  Patient presents with   Advice Only      [name] is an 61 y.o. female.  HPI: [name] is a 61 year old female who presents for evaluation and management of a mass on the right side of her face.  She states the mass has been present for several years and that a physician in the past squeezed it and thick white material came out and it got smaller.  Since that time it is continued to increase in size.  She would like to have it removed.  No Known Allergies  Outpatient Encounter Medications as of 10/16/2022  Medication Sig Note   albuterol (PROVENTIL HFA;VENTOLIN HFA) 108 (90 BASE) MCG/ACT inhaler Inhale 2 puffs into the lungs every 6 (six) hours as needed for shortness of breath.     albuterol (PROVENTIL) (2.5 MG/3ML) 0.083% nebulizer solution Take 2.5 mg by nebulization every 6 (six) hours as needed for wheezing or shortness of breath.    atenolol (TENORMIN) 50 MG tablet Take 50 mg by mouth daily.    cetirizine (ZYRTEC) 10 MG tablet Take 10 mg by mouth daily as needed for allergies.    Cholecalciferol (VITAMIN D3) 125 MCG (5000 UT) CAPS Take 1 capsule (5,000 Units total) by mouth daily.    cyclobenzaprine (FLEXERIL) 10 MG tablet Take 10 mg by mouth 3 (three) times daily as needed for muscle spasms.    escitalopram (LEXAPRO) 10 MG tablet     FLOVENT HFA 220 MCG/ACT inhaler USE 2 PUFFS TWICE A DAY    furosemide (LASIX) 20 MG tablet Take 20 mg by mouth daily as needed for fluid or edema.    ibuprofen (ADVIL,MOTRIN) 800 MG tablet Take 800 mg by mouth 3 (three) times daily as needed for moderate pain.  09/21/2015: .   Insulin Pen Needle (BD PEN NEEDLE NANO 2ND GEN) 32G X 4 MM MISC 1 Package by Does not apply route 2 (two) times daily.    losartan-hydrochlorothiazide (HYZAAR) 100-25 MG tablet Take 1 tablet by mouth daily.    NOREL AD 4-10-325 MG TABS Take 1 tablet by mouth 2  (two) times daily as needed.    Oxycodone HCl 20 MG TABS TAKE 1 TABLET BY MOUTH EVERY 4-6 HOURS (MAX OF 5 PER DAY)    OZEMPIC, 0.25 OR 0.5 MG/DOSE, 2 MG/1.5ML SOPN INJECT 0.'25MG'$  INTO THE SKIN ONE TIME PER WEEK    potassium chloride SA (KLOR-CON) 20 MEQ tablet Take 2 tablets (40 mEq total) by mouth daily for 7 days.    No facility-administered encounter medications on file as of 10/16/2022.     Past Medical History:  Diagnosis Date   Anxiety    Asthma    Back pain    Chronic pain    DDD (degenerative disc disease), lumbar    Degenerative disc disease    Degenerative disc disease, cervical    Degenerative disc disease, cervical    Fibroid    Fibromyalgia    Headache    regular   Heartburn    Hypertension    Joint pain    Localized swelling of both lower legs    Osteoarthritis    Other specified disorders of thyroid    Rheumatoid arthritis (HCC)    Sleep apnea    mild, patient does not use mask   SOB (shortness of breath)     Past Surgical History:  Procedure  Laterality Date   BACK SURGERY     JOINT REPLACEMENT     NOVASURE ABLATION     TOTAL KNEE ARTHROPLASTY Left 01/17/2017   Procedure: LEFT TOTAL KNEE ARTHROPLASTY;  Surgeon: Mcarthur Rossetti, MD;  Location: [facility];  Service: Orthopedics;  Laterality: Left;   TUBAL LIGATION      Family History  Problem Relation Age of Onset   Cancer Mother    Hypertension Mother    Eating disorder Mother    Obesity Mother    Diabetes Father    Heart disease Father    High Cholesterol Father    Kidney disease Father    Alcoholism Father    Eating disorder Father    Cancer Sister    Cancer Brother     Social History   Social History Narrative   Not on file     Review of Systems General: Denies fevers, chills, weight loss CV: Denies chest pain, shortness of breath, palpitations Skin: 4 cm mass on the right side of the face.  Physical Exam    10/16/2022   10:28 AM 10/14/2022    2:53 PM 03/18/2022    1:53 PM   Vitals with BMI  Height  '5\' 7"'$  '5\' 7"'$   Weight 328 lbs 13 oz 331 lbs 348 lbs 10 oz  BMI 51.49 22.02 54.27  Systolic 062    Diastolic 74    Pulse 73      General:  No acute distress,  Alert and oriented, Non-Toxic, Normal speech and affect Integument: 4 cm mass on the right side of the face just below the malar eminence.  There is a small punctum overlying the mass.  The mass is well-circumscribed however it may be fixed to the tissues below it. Mammogram: Not applicable Assessment/Plan Skin lesion: The lesion is consistent with a large epidermoid cyst.  I have offered her excision in the clinic under local.  We discussed the risks of bleeding infection and most importantly scar formation.  She also understands that the mass can return.  She would like to have it removed we will schedule this for the next available procedure slot.  [name] 10/16/2022, 11:30 AM

## 2022-10-18 ENCOUNTER — Encounter: Payer: Self-pay | Admitting: Plastic Surgery

## 2022-10-18 ENCOUNTER — Ambulatory Visit (INDEPENDENT_AMBULATORY_CARE_PROVIDER_SITE_OTHER): Payer: Medicare Other | Admitting: Plastic Surgery

## 2022-10-18 VITALS — BP 145/85 | HR 72

## 2022-10-18 DIAGNOSIS — L720 Epidermal cyst: Secondary | ICD-10-CM | POA: Diagnosis not present

## 2022-10-18 DIAGNOSIS — L989 Disorder of the skin and subcutaneous tissue, unspecified: Secondary | ICD-10-CM

## 2022-10-18 NOTE — Progress Notes (Signed)
Procedure Note  Preoperative Dx: Skin lesion right side of face  Postoperative Dx: Same  Procedure: Excision of skin lesion, 2 cm, closure of wound in 2 layers.  Anesthesia: Lidocaine 1% with 1:100,000 epinephrine and 0.25% Sensorcaine   Indication for Procedure: This is a mass which Ms. Man states was treated with pressure in the past and expressed a large amount of thick white material.  The mass was gone for many years but is slowly started to regrow and has continued growing over the past few months.  Description of Procedure: Risks and complications were explained to the patient including but not limited to bleeding recurrence and scar formation.  Consent was confirmed and the patient understands the risks and benefits.  The potential complications and alternatives were explained and the patient consents.  The patient expressed understanding the option of not having the procedure and the risks of a scar.  Time out was called and all information was confirmed to be correct.    The area was prepped and drapped.  Local anesthetic was injected in the subcutaneous tissues.  After waiting for the local to take affect a 2 cm incision was made over the mass and a combination of sharp and blunt dissection were used to isolate the mass.  Due to the large size of the mass the contents were removed and the cyst wall was removed in pieces.  After removing all of the mass that I could see the wound was irrigated with saline and inspected again there were 2 small pieces of the cyst wall which remained. These were also excised with sharp dissection.  Due to the extremely deep nature of the cyst wall I had the patient close and open her eyes and smile she had no deficits at the conclusion of the case.  After obtaining hemostasis, the surgical wound was closed with 4-0 Monocryl sutures in the deep layers and interrupted 5-0 Prolene sutures in the skin..  The surgical wound measured 2 cm and the mass was  approximately 3 cm.  A dressing was applied.  The patient was given instructions on how to care for the area and a follow up appointment.  Tifany tolerated the procedure well and there were no complications. The specimen was sent to pathology.

## 2022-10-22 NOTE — Progress Notes (Signed)
Patient is a 61 year old female with PMH of right-sided facial mass s/p excision performed in office on 10/16/2022 by Dr. Lovena Le who returns to clinic for postprocedural follow-up.  Reviewed procedure note and the mass seemed consistent with a cyst.  It was excised and closed with 4-0 Monocryl deep sutures as well as interrupted 5-0 Prolene skin sutures.  The mass was approximately 3 cm in size.  Pathology report is still active and has not yet resulted.  Today, patient is doing well.  She does feel a small bump underneath the excision site and is hoping that it will go away.  No other complaints.  Feels prepared for suture removal.  Excision site appears to be healing well, well approximated. All four 5-0 Prolene simple skin sutures are removed without complication or difficulty.  No surrounding erythema or cellulitic changes.  Suspect that the small bump that patient reported is due to the deep closure with 4-0 Monocryl which she is advised we will absorb over the course of the next few months.  Suspect that this will soften with time.  Provided reassurance.  Patient is overall quite pleased and the exam is entirely reassuring.  Sutures removed and no specific follow-up needed.  She can certainly call the clinic should she have any questions or concerns.  Discussed scar mitigation treatments.  She will proceed with Silagen.  Recommending that she apply thin film of Vaseline x7 days and then initiate the scar treatment beginning next week along with gentle massage.  Picture(s) obtained of the patient and placed in the chart were with the patient's or guardian's permission.

## 2022-10-23 ENCOUNTER — Ambulatory Visit (INDEPENDENT_AMBULATORY_CARE_PROVIDER_SITE_OTHER): Payer: Medicare Other | Admitting: Physician Assistant

## 2022-10-23 DIAGNOSIS — Z09 Encounter for follow-up examination after completed treatment for conditions other than malignant neoplasm: Secondary | ICD-10-CM

## 2022-10-24 ENCOUNTER — Ambulatory Visit: Payer: Medicare Other | Admitting: Plastic Surgery

## 2022-10-31 ENCOUNTER — Ambulatory Visit: Payer: Medicare Other

## 2022-11-19 ENCOUNTER — Other Ambulatory Visit (HOSPITAL_COMMUNITY): Payer: Self-pay

## 2022-11-19 MED ORDER — OZEMPIC (2 MG/DOSE) 8 MG/3ML ~~LOC~~ SOPN
2.0000 mg | PEN_INJECTOR | SUBCUTANEOUS | 2 refills | Status: AC
  Filled 2022-11-19 – 2022-12-03 (×2): qty 3, 28d supply, fill #0

## 2022-11-22 ENCOUNTER — Ambulatory Visit: Payer: Medicare Other

## 2022-11-27 ENCOUNTER — Other Ambulatory Visit (HOSPITAL_COMMUNITY): Payer: Self-pay

## 2022-12-03 ENCOUNTER — Other Ambulatory Visit (HOSPITAL_COMMUNITY): Payer: Self-pay

## 2022-12-13 ENCOUNTER — Other Ambulatory Visit (HOSPITAL_COMMUNITY): Payer: Self-pay

## 2022-12-18 ENCOUNTER — Ambulatory Visit: Payer: Medicare Other | Admitting: Orthopaedic Surgery

## 2022-12-30 ENCOUNTER — Encounter: Payer: Self-pay | Admitting: Physician Assistant

## 2022-12-30 ENCOUNTER — Ambulatory Visit (INDEPENDENT_AMBULATORY_CARE_PROVIDER_SITE_OTHER): Payer: 59 | Admitting: Physician Assistant

## 2022-12-30 DIAGNOSIS — M1711 Unilateral primary osteoarthritis, right knee: Secondary | ICD-10-CM | POA: Diagnosis not present

## 2022-12-30 DIAGNOSIS — Z96652 Presence of left artificial knee joint: Secondary | ICD-10-CM | POA: Diagnosis not present

## 2022-12-30 NOTE — Progress Notes (Signed)
HPI: Mrs. Ramdass comes in today complaining of bilateral knee pain.  She explains that she can have injection in her right knee which she had on October 14, 2022.  She states the injection helped for several months but now her knee pain is back.  She has had no new injury.  She is also having pain in her left knee she underwent a left total knee arthroplasty by Dr. Ninfa Linden 2018.  Recent radiographs of her left knee dated 06/26/2022 were reviewed and shows left total knee arthroplasty components to be well-seated.  No acute findings.  Right knee is also visualized on the AP view and shows basically bone-on-bone arthritis medial compartment with mild lateral compartment changes.  She has been taking ibuprofen for the pain she is on chronic oxycodone.  Review of systems: Negative for fevers chills.  Please see HPI otherwise negative  Physical exam: General well-developed well-nourished female walks with an antalgic gait without any assistive device. Bilateral knees: No abnormal warmth erythema or effusion.  No instability valgus varus stressing of either knee.  Right knee with a varus malalignment.  Tenderness along medial joint line right knee.  Left knee tenderness over the pes anserinus region.  Surgical incisions well-healed left knee.  Impression: Right knee end-stage arthritis Left knee pain history of total knee arthroplasty  Plan: Offered pes anserinus injection for the left knee she defers.  She will follow-up with [name] at the end of the month for an injection in her right knee.  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  When asked about assistive device she is waiting on her rolling walker to to be repaired over 2 obtain a new one from medical supply store here in town.  Recommended in the interim that she use a cane and her right hand due to the left knee giving way.  Continue Voltaren gel over the left knee particularly over the pes anserinus region.  She also ask about a knot on the back of  her left thigh.  This is examined.  There is an area of scaly skin but no drainage.  No erythema.  Slight induration.  Discussed with her that she should follow-up with dermatology for this.

## 2023-01-14 ENCOUNTER — Ambulatory Visit
Admission: RE | Admit: 2023-01-14 | Discharge: 2023-01-14 | Disposition: A | Discharge status: Home / Self Care | Payer: 59 | Source: Ambulatory Visit | Attending: Internal Medicine | Admitting: Internal Medicine

## 2023-01-14 DIAGNOSIS — Z1231 Encounter for screening mammogram for malignant neoplasm of breast: Secondary | ICD-10-CM

## 2023-01-15 ENCOUNTER — Ambulatory Visit (INDEPENDENT_AMBULATORY_CARE_PROVIDER_SITE_OTHER): Payer: 59 | Admitting: Orthopaedic Surgery

## 2023-01-15 ENCOUNTER — Encounter: Payer: Self-pay | Admitting: Orthopaedic Surgery

## 2023-01-15 VITALS — Ht 67.0 in | Wt 324.0 lb

## 2023-01-15 DIAGNOSIS — M25561 Pain in right knee: Secondary | ICD-10-CM | POA: Diagnosis not present

## 2023-01-15 DIAGNOSIS — M1711 Unilateral primary osteoarthritis, right knee: Secondary | ICD-10-CM | POA: Diagnosis not present

## 2023-01-15 DIAGNOSIS — G8929 Other chronic pain: Secondary | ICD-10-CM | POA: Diagnosis not present

## 2023-01-15 MED ORDER — METHYLPREDNISOLONE ACETATE 40 MG/ML IJ SUSP
40.0000 mg | INTRAMUSCULAR | Status: AC | PRN
  Administered 2023-01-15: 40 mg via INTRA_ARTICULAR

## 2023-01-15 MED ORDER — LIDOCAINE HCL 1 % IJ SOLN
3.0000 mL | INTRAMUSCULAR | Status: AC | PRN
  Administered 2023-01-15: 3 mL

## 2023-01-15 NOTE — Progress Notes (Signed)
The patient comes in today to have a steroid injection in her right knee.  She has known significant arthritis of the right knee and a history of a left knee replacement done in 2017 or 18.  Both knees hurt and sometimes the left knee does not like it can give way on her.  She has lost some weight.  Her BMI today is 50.75 with a weight of 324 pounds.  She is seeing a weight loss specialist as well.  Her right knee she has patellofemoral crepitation and pain throughout the arc of motion.  I did place a steroid injection in her right knee without difficulty.  She knows to wait at least 3 months between injections.  When we do see her back in 3 months we will continue to have a weight and BMI check.       Procedure Note  Patient: [name]             Date of Birth: 05-Oct-1961           MRN: [id number]             Visit Date: 01/15/2023  Procedures: Visit Diagnoses:  1. Primary osteoarthritis of right knee   2. Chronic pain of right knee     Large Joint Inj: R knee on 01/15/2023 1:03 PM Indications: diagnostic evaluation and pain Details: 22 G 1.5 in needle, superolateral approach  Arthrogram: No  Medications: 3 mL lidocaine 1 %; 40 mg methylPREDNISolone acetate 40 MG/ML Outcome: tolerated well, no immediate complications Procedure, treatment alternatives, risks and benefits explained, specific risks discussed. Consent was given by the patient. Immediately prior to procedure a time out was called to verify the correct patient, procedure, equipment, support staff and site/side marked as required. Patient was prepped and draped in the usual sterile fashion.

## 2023-02-10 ENCOUNTER — Ambulatory Visit: Payer: 59 | Admitting: Plastic Surgery

## 2023-02-13 ENCOUNTER — Telehealth: Payer: Self-pay | Admitting: Plastic Surgery

## 2023-02-13 ENCOUNTER — Ambulatory Visit: Payer: 59 | Admitting: Plastic Surgery

## 2023-02-13 NOTE — Telephone Encounter (Signed)
I'd be happy to see her next Wednesday or Thursday for an in-office follow-up

## 2023-02-13 NOTE — Telephone Encounter (Signed)
Pt called to cancel appt today 02/13/23 due to car trouble.  Appt was regarding px:11/3 excision of mass on (R) side of face, states there is a hard not that is still very tender at times. Is this normal? Please call pt for detail.

## 2023-02-17 ENCOUNTER — Ambulatory Visit: Payer: 59 | Admitting: Plastic Surgery

## 2023-02-20 ENCOUNTER — Encounter: Payer: Self-pay | Admitting: Radiology

## 2023-02-20 ENCOUNTER — Ambulatory Visit (INDEPENDENT_AMBULATORY_CARE_PROVIDER_SITE_OTHER): Payer: 59 | Admitting: Physician Assistant

## 2023-02-20 DIAGNOSIS — R238 Other skin changes: Secondary | ICD-10-CM

## 2023-02-20 DIAGNOSIS — D2330 Other benign neoplasm of skin of unspecified part of face: Secondary | ICD-10-CM

## 2023-02-20 DIAGNOSIS — Z09 Encounter for follow-up examination after completed treatment for conditions other than malignant neoplasm: Secondary | ICD-10-CM

## 2023-02-20 NOTE — Progress Notes (Signed)
Referring Provider Nolene Ebbs, MD [address] [place],  [place] [address]   CC:  Chief Complaint  Patient presents with   Follow-up      [name] is an 62 y.o. female.  HPI: Patient is a 62 year old female with PMH of right-sided facial mass s/p excision performed in office on 10/16/2022 by Dr. Lovena Le who returns to clinic for 11-monthfollow-up.  Reviewed procedure note and due to the large size of the mass, the contents and cystic walls were removed in pieces.  Additionally, due to the deep nature of the cyst wall, Dr. TLovena Lehad to be careful to avoid injury to the facial nerve.  Mass was approximately 3 cm in size and deep 4-0 Monocryl sutures were used as part of the layered closure.  She was last seen here in clinic on 10/23/2022.  At that time, all four 5-0 Prolene simple skin sutures were removed in their entirety without complication or difficulty.  There was a small bump underneath the excision site appreciated on exam.  Suspected that it was due to the deep 4-0 Monocryl suture closure and that it would soften with time.  No specific follow-up needed.  Discussed Silagen silicone scar mitigation gel.  Today, she feels as though there is still a bump there and wanted to be evaluated.  She denies any pain, drainage, tenderness, swelling, or other concerns.  However, it was her understanding that it would go away and it has not yet.  She has not been massaging the area or applying any scar mitigation treatments.   No Known Allergies  Outpatient Encounter Medications as of 02/20/2023  Medication Sig Note   albuterol (PROVENTIL HFA;VENTOLIN HFA) 108 (90 BASE) MCG/ACT inhaler Inhale 2 puffs into the lungs every 6 (six) hours as needed for shortness of breath.     albuterol (PROVENTIL) (2.5 MG/3ML) 0.083% nebulizer solution Take 2.5 mg by nebulization every 6 (six) hours as needed for wheezing or shortness of breath.    atenolol (TENORMIN) 50 MG tablet Take 50 mg by mouth  daily.    cetirizine (ZYRTEC) 10 MG tablet Take 10 mg by mouth daily as needed for allergies.    Cholecalciferol (VITAMIN D3) 125 MCG (5000 UT) CAPS Take 1 capsule (5,000 Units total) by mouth daily.    cyclobenzaprine (FLEXERIL) 10 MG tablet Take 10 mg by mouth 3 (three) times daily as needed for muscle spasms.    escitalopram (LEXAPRO) 10 MG tablet     FLOVENT HFA 220 MCG/ACT inhaler USE 2 PUFFS TWICE A DAY    furosemide (LASIX) 20 MG tablet Take 20 mg by mouth daily as needed for fluid or edema.    ibuprofen (ADVIL,MOTRIN) 800 MG tablet Take 800 mg by mouth 3 (three) times daily as needed for moderate pain.  09/21/2015: .   Insulin Pen Needle (BD PEN NEEDLE NANO 2ND GEN) 32G X 4 MM MISC 1 Package by Does not apply route 2 (two) times daily.    losartan-hydrochlorothiazide (HYZAAR) 100-25 MG tablet Take 1 tablet by mouth daily.    NOREL AD 4-10-325 MG TABS Take 1 tablet by mouth 2 (two) times daily as needed.    Oxycodone HCl 20 MG TABS TAKE 1 TABLET BY MOUTH EVERY 4-6 HOURS (MAX OF 5 PER DAY)    OZEMPIC, 0.25 OR 0.5 MG/DOSE, 2 MG/1.5ML SOPN INJECT 0.'25MG'$  INTO THE SKIN ONE TIME PER WEEK    Semaglutide, 2 MG/DOSE, (OZEMPIC, 2 MG/DOSE,) 8 MG/3ML SOPN Inject 2 mg into  the skin once a week.    potassium chloride SA (KLOR-CON) 20 MEQ tablet Take 2 tablets (40 mEq total) by mouth daily for 7 days.    No facility-administered encounter medications on file as of 02/20/2023.     Past Medical History:  Diagnosis Date   Anxiety    Asthma    Back pain    Chronic pain    DDD (degenerative disc disease), lumbar    Degenerative disc disease    Degenerative disc disease, cervical    Degenerative disc disease, cervical    Fibroid    Fibromyalgia    Headache    regular   Heartburn    Hypertension    Joint pain    Localized swelling of both lower legs    Osteoarthritis    Other specified disorders of thyroid    Rheumatoid arthritis ([place])    Sleep apnea    mild, patient does not use mask   SOB  (shortness of breath)     Past Surgical History:  Procedure Laterality Date   BACK SURGERY     JOINT REPLACEMENT     NOVASURE ABLATION     TOTAL KNEE ARTHROPLASTY Left 01/17/2017   Procedure: LEFT TOTAL KNEE ARTHROPLASTY;  Surgeon: Mcarthur Rossetti, MD;  Location: [facility];  Service: Orthopedics;  Laterality: Left;   TUBAL LIGATION      Family History  Problem Relation Age of Onset   Cancer Mother    Hypertension Mother    Eating disorder Mother    Obesity Mother    Diabetes Father    Heart disease Father    High Cholesterol Father    Kidney disease Father    Alcoholism Father    Eating disorder Father    Cancer Sister    Cancer Brother     Social History   Social History Narrative   Not on file     Review of Systems General: Denies fevers or chills Skin: Denies any incisional wounds, drainage, tenderness  Physical Exam    01/15/2023   12:52 PM 10/18/2022    8:09 AM 10/16/2022   10:28 AM  Vitals with BMI  Height '5\' 7"'$     Weight 324 lbs  328 lbs 13 oz  BMI Q000111Q  [id number]  Systolic  Q000111Q Q000111Q  Diastolic  85 74  Pulse  72 73    General:  No acute distress, nontoxic appearing  Respiratory: No increased work of breathing Neuro: Alert and oriented Psychiatric: Normal mood and affect  Skin: 0.75 x 0.5 cm area of firmness underneath the incision which is well-healed.  No fluctuance, crepitus, suspected fluid collection, or overlying skin changes.  Assessment/Plan  S/p excision of facial dermoid cyst right cheek: Patient does have a small, 0.75 x 0.5 cm area of firmness beneath the well-healed excision site.  Suspect that it is scar tissue formation.  Recommending mechanical massage of that area 2-3 times per day.  We also discussed silicone scar gels to mitigate scarring. Emphasized the importance of massage as it will hopefully help soften the area.  Cannot guarantee that it would go away completely, but have low suspicion for subcutaneous fluid collection,  recurrence of cyst, infection, or other causes that may require intervention.  No specific follow-up needed, but if patient is still bothered in 3 to 6 months and is without any improvement from regular massage, she can certainly follow-up with Dr. Lovena Le for reevaluation and possible consideration of scar revision.  Picture(s) obtained of  the patient and placed in the chart were with the patient's or guardian's permission.   Krista Blue 02/20/2023, 11:13 AM

## 2023-03-10 ENCOUNTER — Encounter: Payer: Self-pay | Admitting: Plastic Surgery

## 2023-03-10 ENCOUNTER — Ambulatory Visit (INDEPENDENT_AMBULATORY_CARE_PROVIDER_SITE_OTHER): Payer: 59 | Admitting: Plastic Surgery

## 2023-03-10 VITALS — HR 60 | Ht 67.0 in | Wt 330.8 lb

## 2023-03-10 DIAGNOSIS — R21 Rash and other nonspecific skin eruption: Secondary | ICD-10-CM

## 2023-03-10 DIAGNOSIS — Z6841 Body Mass Index (BMI) 40.0 and over, adult: Secondary | ICD-10-CM

## 2023-03-10 DIAGNOSIS — M549 Dorsalgia, unspecified: Secondary | ICD-10-CM

## 2023-03-10 DIAGNOSIS — E66813 Obesity, class 3: Secondary | ICD-10-CM

## 2023-03-10 DIAGNOSIS — M793 Panniculitis, unspecified: Secondary | ICD-10-CM

## 2023-03-11 NOTE — Progress Notes (Signed)
Referring Provider Nolene Ebbs, MD [address] [place],  [place] [address]   CC:  Chief Complaint  Patient presents with   Advice Only      [name] is an 62 y.o. female.  HPI: [name] presents today for evaluation for panniculectomy.  I have previously seen her for excision of a facial lesion but she now is interested in discussing removal of the fat on her anterior abdominal wall.  She states that she has had difficulty with back pain and rashes underneath the pannus and would like to have it removed.  No Known Allergies  Outpatient Encounter Medications as of 03/10/2023  Medication Sig Note   albuterol (PROVENTIL HFA;VENTOLIN HFA) 108 (90 BASE) MCG/ACT inhaler Inhale 2 puffs into the lungs every 6 (six) hours as needed for shortness of breath.     albuterol (PROVENTIL) (2.5 MG/3ML) 0.083% nebulizer solution Take 2.5 mg by nebulization every 6 (six) hours as needed for wheezing or shortness of breath.    atenolol (TENORMIN) 50 MG tablet Take 50 mg by mouth daily.    cetirizine (ZYRTEC) 10 MG tablet Take 10 mg by mouth daily as needed for allergies.    Cholecalciferol (VITAMIN D3) 125 MCG (5000 UT) CAPS Take 1 capsule (5,000 Units total) by mouth daily.    cyclobenzaprine (FLEXERIL) 10 MG tablet Take 10 mg by mouth 3 (three) times daily as needed for muscle spasms.    escitalopram (LEXAPRO) 10 MG tablet     FLOVENT HFA 220 MCG/ACT inhaler USE 2 PUFFS TWICE A DAY    furosemide (LASIX) 20 MG tablet Take 20 mg by mouth daily as needed for fluid or edema.    ibuprofen (ADVIL,MOTRIN) 800 MG tablet Take 800 mg by mouth 3 (three) times daily as needed for moderate pain.  09/21/2015: .   Insulin Pen Needle (BD PEN NEEDLE NANO 2ND GEN) 32G X 4 MM MISC 1 Package by Does not apply route 2 (two) times daily.    losartan-hydrochlorothiazide (HYZAAR) 100-25 MG tablet Take 1 tablet by mouth daily.    NOREL AD 4-10-325 MG TABS Take 1 tablet by mouth 2 (two) times daily as needed.     Oxycodone HCl 20 MG TABS TAKE 1 TABLET BY MOUTH EVERY 4-6 HOURS (MAX OF 5 PER DAY)    OZEMPIC, 0.25 OR 0.5 MG/DOSE, 2 MG/1.5ML SOPN INJECT 0.25MG  INTO THE SKIN ONE TIME PER WEEK    potassium chloride SA (KLOR-CON) 20 MEQ tablet Take 2 tablets (40 mEq total) by mouth daily for 7 days.    Semaglutide, 2 MG/DOSE, (OZEMPIC, 2 MG/DOSE,) 8 MG/3ML SOPN Inject 2 mg into the skin once a week.    No facility-administered encounter medications on file as of 03/10/2023.     Past Medical History:  Diagnosis Date   Anxiety    Asthma    Back pain    Chronic pain    DDD (degenerative disc disease), lumbar    Degenerative disc disease    Degenerative disc disease, cervical    Degenerative disc disease, cervical    Fibroid    Fibromyalgia    Headache    regular   Heartburn    Hypertension    Joint pain    Localized swelling of both lower legs    Osteoarthritis    Other specified disorders of thyroid    Rheumatoid arthritis ([employer])    Sleep apnea    mild, patient does not use mask   SOB (shortness of breath)  Past Surgical History:  Procedure Laterality Date   BACK SURGERY     JOINT REPLACEMENT     NOVASURE ABLATION     TOTAL KNEE ARTHROPLASTY Left 01/17/2017   Procedure: LEFT TOTAL KNEE ARTHROPLASTY;  Surgeon: Mcarthur Rossetti, MD;  Location: [facility];  Service: Orthopedics;  Laterality: Left;   TUBAL LIGATION      Family History  Problem Relation Age of Onset   Cancer Mother    Hypertension Mother    Eating disorder Mother    Obesity Mother    Diabetes Father    Heart disease Father    High Cholesterol Father    Kidney disease Father    Alcoholism Father    Eating disorder Father    Cancer Sister    Cancer Brother     Social History   Social History Narrative   Not on file     Review of Systems General: Denies fevers, chills, weight loss CV: Denies chest pain, shortness of breath, palpitations, endorses pain with ambulation Pannus complaints of back pain and  difficulty ambulating due to the size of her pannus.  Physical Exam    03/10/2023   10:01 AM 01/15/2023   12:52 PM 10/18/2022    8:09 AM  Vitals with BMI  Height 5\' 7"  5\' 7"    Weight 330 lbs 13 oz 324 lbs   BMI 0000000 [id number]   Systolic   [id number]  Diastolic   85  Pulse 60  72    General:  No acute distress,  Alert and oriented, Non-Toxic, Normal speech and affect Pannus: Patient has an extremely large amount of anterior abdominal wall fat. Mammogram: Mammogram February 2024 BI-RADS 1 Assessment/Plan Morbid obesity: The patient currently has a BMI of 51.8.  I have not offered her any surgical therapy at this time.  I do not feel that it would be in her best interest to proceed with a panniculectomy.  We discussed at length the importance of weight loss prior to considering surgery.  We discussed strategies for weight loss including and focusing her diet on a protein rich diet heavy in vegetables.  Discussed the importance of avoiding processed foods and especially ice cream.  Will refer her to healthy weight and wellness.  She will follow-up with me in 3 months.  [name] 03/11/2023, 4:32 PM

## 2023-04-09 ENCOUNTER — Ambulatory Visit (INDEPENDENT_AMBULATORY_CARE_PROVIDER_SITE_OTHER): Payer: 59 | Admitting: Plastic Surgery

## 2023-04-09 ENCOUNTER — Institutional Professional Consult (permissible substitution): Payer: 59 | Admitting: Plastic Surgery

## 2023-04-09 ENCOUNTER — Encounter: Payer: Self-pay | Admitting: Plastic Surgery

## 2023-04-09 DIAGNOSIS — M793 Panniculitis, unspecified: Secondary | ICD-10-CM

## 2023-04-09 NOTE — Progress Notes (Signed)
[name] returns today for follow-up evaluation.  She is still interested in a panniculectomy is still having difficulty with mobility due to her pannus.  Additionally she is having increased difficulty with knee pain due to her osteoarthritis.  He is using a walker now to get up and down the hall due to the pain in her knee.  Since our last meeting she has lost 2 pounds.  I have encouraged her to continue working on her diet and exercise as she is able.  We did discuss the possibility of use of a pool to walk into help with her knee pain.  I have asked her to discuss with her primary care provider the possibility of using her osteoarthritis is a justification for her Ozempic.  Additionally I have asked her to discuss the possibility of a referral for therapy for depression with her primary care provider.  I will resubmit her referral to healthy weight and wellness.  Follow-up with me at her scheduled appointment in June

## 2023-04-16 ENCOUNTER — Other Ambulatory Visit (INDEPENDENT_AMBULATORY_CARE_PROVIDER_SITE_OTHER): Payer: 59

## 2023-04-16 ENCOUNTER — Ambulatory Visit (INDEPENDENT_AMBULATORY_CARE_PROVIDER_SITE_OTHER): Payer: 59 | Admitting: Orthopaedic Surgery

## 2023-04-16 VITALS — Wt 322.0 lb

## 2023-04-16 DIAGNOSIS — Z96652 Presence of left artificial knee joint: Secondary | ICD-10-CM | POA: Diagnosis not present

## 2023-04-16 DIAGNOSIS — M1711 Unilateral primary osteoarthritis, right knee: Secondary | ICD-10-CM | POA: Diagnosis not present

## 2023-04-16 DIAGNOSIS — G8929 Other chronic pain: Secondary | ICD-10-CM

## 2023-04-16 DIAGNOSIS — M25561 Pain in right knee: Secondary | ICD-10-CM

## 2023-04-16 MED ORDER — METHYLPREDNISOLONE ACETATE 40 MG/ML IJ SUSP
40.0000 mg | INTRAMUSCULAR | Status: AC | PRN
Start: 2023-04-16 — End: 2023-04-16
  Administered 2023-04-16: 40 mg via INTRA_ARTICULAR

## 2023-04-16 MED ORDER — LIDOCAINE HCL 1 % IJ SOLN
3.0000 mL | INTRAMUSCULAR | Status: AC | PRN
Start: 2023-04-16 — End: 2023-04-16
  Administered 2023-04-16: 3 mL

## 2023-04-16 NOTE — Progress Notes (Signed)
The patient is well-known to [name].  We replaced her left knee in 2018.  She does ambulate using a cane due to severe arthritis and her right knee.  Her left knee has been bothering her recently as well as her left foot and ankle.  She is someone who is morbidly obese.  Her weight today is 322 pounds which is down from 330 pounds from March 25.  Her BMI is 50.43.  She is not a diabetic.  She is requesting a steroid injection in her right knee today.  Right knee does have varus malalignment and patellofemoral crepitation as well as medial joint line tenderness.  Previous x-rays of her right knee show tricompartment arthritis mainly involving the patellofemoral joint and significant narrowing with bone-on-bone wear the medial compartment the knee.  Her left knee is moving well today.  X-rays of left knee today show well-seated total knee arthroplasty with no complicating features.  Per her request I did place a steroid injection in her right knee today which she tolerated well.  She is continuing on a weight loss journey.  I would be more comfortable with proceeding with a total knee arthroplasty on her right knee when she loses more weight and certainly gets below 300 pounds and hopefully BMI of closer to 44-46.  We will see her back in 3 months for repeat weight and BMI calculation.  We can always place a steroid injection in her right knee then if needed.     Procedure Note  Patient: [name]             Date of Birth: 1961-10-29           MRN: [id number]             Visit Date: 04/16/2023  Procedures: Visit Diagnoses:  1. History of left knee replacement   2. Chronic pain of right knee   3. Primary osteoarthritis of right knee     Large Joint Inj: R knee on 04/16/2023 1:20 PM Indications: diagnostic evaluation and pain Details: 22 G 1.5 in needle, superolateral approach  Arthrogram: No  Medications: 3 mL lidocaine 1 %; 40 mg methylPREDNISolone acetate 40 MG/ML Outcome: tolerated well,  no immediate complications Procedure, treatment alternatives, risks and benefits explained, specific risks discussed. Consent was given by the patient. Immediately prior to procedure a time out was called to verify the correct patient, procedure, equipment, support staff and site/side marked as required. Patient was prepped and draped in the usual sterile fashion.

## 2023-05-19 ENCOUNTER — Ambulatory Visit: Payer: 59 | Admitting: Plastic Surgery

## 2023-05-21 ENCOUNTER — Ambulatory Visit: Payer: 59 | Admitting: Plastic Surgery

## 2023-05-21 ENCOUNTER — Ambulatory Visit (INDEPENDENT_AMBULATORY_CARE_PROVIDER_SITE_OTHER): Payer: 59 | Admitting: Plastic Surgery

## 2023-05-21 DIAGNOSIS — Z6841 Body Mass Index (BMI) 40.0 and over, adult: Secondary | ICD-10-CM | POA: Diagnosis not present

## 2023-05-21 DIAGNOSIS — M793 Panniculitis, unspecified: Secondary | ICD-10-CM | POA: Diagnosis not present

## 2023-05-21 DIAGNOSIS — M549 Dorsalgia, unspecified: Secondary | ICD-10-CM

## 2023-05-21 NOTE — Progress Notes (Signed)
[name] returns today for discussion of her panniculectomy.  She is still working on weight loss and has indeed decreased her BMI from 51-50.  She is still having discomfort in her back and knees.  I have encouraged her to discuss her back pain with her primary care to see if there is any further treatment that she might be able to undergo to get relief.  We did discuss use of multiple drugs as weight to help her with some exercise since she is no longer able to ambulate.  She will begin this today and let me know how she is doing when she returns in 1 month

## 2023-06-23 ENCOUNTER — Ambulatory Visit: Payer: 59 | Admitting: Plastic Surgery

## 2023-07-07 ENCOUNTER — Ambulatory Visit: Payer: 59 | Admitting: Plastic Surgery

## 2023-07-08 ENCOUNTER — Other Ambulatory Visit: Payer: Self-pay | Admitting: Internal Medicine

## 2023-07-09 LAB — LIPID PANEL
Cholesterol: 201 mg/dL — ABNORMAL HIGH (ref <200)
HDL: 50 mg/dL (ref >=50)
LDL Cholesterol (Calc): 130 mg/dL (calc) — ABNORMAL HIGH
Non-HDL Cholesterol (Calc): 151 mg/dL (calc) — ABNORMAL HIGH (ref <130)
Total CHOL/HDL Ratio: 4 (calc) (ref <5.0)
Triglycerides: 104 mg/dL (ref <150)

## 2023-07-09 LAB — COMPLETE METABOLIC PANEL WITH GFR
AG Ratio: 1.6 (calc) (ref 1.0–2.5)
ALT: 17 U/L (ref 6–29)
AST: 17 U/L (ref 10–35)
Albumin: 4.2 g/dL (ref 3.6–5.1)
Alkaline phosphatase (APISO): 79 U/L (ref 37–153)
BUN: 18 mg/dL (ref 7–25)
CO2: 24 mmol/L (ref 20–32)
Calcium: 10 mg/dL (ref 8.6–10.4)
Chloride: 102 mmol/L (ref 98–110)
Creat: 0.84 mg/dL (ref 0.50–1.05)
Globulin: 2.7 g/dL (calc) (ref 1.9–3.7)
Glucose, Bld: 93 mg/dL (ref 65–99)
Potassium: 3.7 mmol/L (ref 3.5–5.3)
Sodium: 139 mmol/L (ref 135–146)
Total Bilirubin: 0.7 mg/dL (ref 0.2–1.2)
Total Protein: 6.9 g/dL (ref 6.1–8.1)
eGFR: 78 mL/min/{1.73_m2} (ref >=60)

## 2023-07-09 LAB — CBC
HCT: 38.4 % (ref 35.0–45.0)
Hemoglobin: 13 g/dL (ref 11.7–15.5)
MCH: 30.9 pg (ref 27.0–33.0)
MCHC: 33.9 g/dL (ref 32.0–36.0)
MCV: 91.2 fL (ref 80.0–100.0)
MPV: 9.4 fL (ref 7.5–12.5)
Platelets: 389 10*3/uL (ref 140–400)
RBC: 4.21 10*6/uL (ref 3.80–5.10)
RDW: 13 % (ref 11.0–15.0)
WBC: 4.7 10*3/uL (ref 3.8–10.8)

## 2023-07-09 LAB — VITAMIN D 25 HYDROXY (VIT D DEFICIENCY, FRACTURES): Vit D, 25-Hydroxy: 66 ng/mL (ref 30–100)

## 2023-07-09 LAB — TSH: TSH: 0.95 mIU/L (ref 0.40–4.50)

## 2023-07-17 ENCOUNTER — Ambulatory Visit: Payer: 59 | Admitting: Orthopaedic Surgery

## 2023-07-28 ENCOUNTER — Ambulatory Visit: Payer: 59 | Admitting: Physician Assistant

## 2023-08-13 ENCOUNTER — Ambulatory Visit: Payer: 59 | Admitting: Plastic Surgery

## 2023-08-13 ENCOUNTER — Ambulatory Visit: Payer: 59 | Admitting: Surgical

## 2023-08-13 NOTE — Progress Notes (Deleted)
   Referring Provider Fleet Contras, MD [address] [place],  [place] [address]   CC: No chief complaint on file.     [name] is an 62 y.o. female.  HPI: Patient is a 62 year old female who presents to discuss panniculectomy and weight loss.  She initially saw Dr. Ladona Ridgel to discuss panniculectomy and at the time of initial consultation, discussed importance of weight loss prior to panniculectomy surgery.   She was last seen in the office about about 2.5 months ago on 05/21/2023.  Discussed continued weight loss with pharmacological assistance due to her inability to ambulate any longer.  Review of Systems General: ***  Physical Exam    05/21/2023    4:01 PM 04/16/2023   12:56 PM 03/10/2023   10:01 AM  Vitals with BMI  Height   5\' 7"   Weight 322 lbs 322 lbs 330 lbs 13 oz  BMI   51.8  Pulse   60    General:  No acute distress,  Alert and oriented, Non-Toxic, Normal speech and affect ***   Assessment/Plan ***  Kermit Balo Fidencio Duddy 08/13/2023, 2:02 PM

## 2023-08-14 ENCOUNTER — Other Ambulatory Visit (INDEPENDENT_AMBULATORY_CARE_PROVIDER_SITE_OTHER): Payer: 59

## 2023-08-14 ENCOUNTER — Ambulatory Visit (INDEPENDENT_AMBULATORY_CARE_PROVIDER_SITE_OTHER): Payer: 59 | Admitting: Physician Assistant

## 2023-08-14 DIAGNOSIS — M5441 Lumbago with sciatica, right side: Secondary | ICD-10-CM | POA: Diagnosis not present

## 2023-08-14 DIAGNOSIS — M1711 Unilateral primary osteoarthritis, right knee: Secondary | ICD-10-CM

## 2023-08-14 DIAGNOSIS — M5442 Lumbago with sciatica, left side: Secondary | ICD-10-CM

## 2023-08-14 MED ORDER — METHYLPREDNISOLONE ACETATE 40 MG/ML IJ SUSP
40.0000 mg | INTRAMUSCULAR | Status: AC | PRN
Start: 2023-08-14 — End: 2023-08-14
  Administered 2023-08-14: 40 mg via INTRA_ARTICULAR

## 2023-08-14 MED ORDER — LIDOCAINE HCL 1 % IJ SOLN
3.0000 mL | INTRAMUSCULAR | Status: AC | PRN
Start: 2023-08-14 — End: 2023-08-14
  Administered 2023-08-14: 3 mL

## 2023-08-14 NOTE — Progress Notes (Signed)
Office Visit Note   Patient: [name]           Date of Birth: [birth year]           MRN: [id number] Visit Date: 08/14/2023              Requested by: Fleet Contras, MD [address] [place],  [place] [address] PCP: Fleet Contras, MD   Assessment & Plan: Visit Diagnoses:  1. Low back pain with bilateral sciatica, unspecified back pain laterality, unspecified chronicity     Plan: She knows to watch her glucose levels closely over the next 24 to 48 hours.  Regards to her lumbar spine given her radicular symptoms down both the legs recommend MRI to rule out HNP as the source of her radiculopathy.  See her back after the MRI to go over results and discuss further treatment.  Questions were encouraged and answered at length.  She will continue her current medications that are prescribed by pain management to help with her pain.  Follow-Up Instructions: Return for After MRI.   Orders:  Orders Placed This Encounter  Procedures   Large Joint Inj   XR Lumbar Spine 2-3 Views   No orders of the defined types were placed in this encounter.     Procedures: Large Joint Inj on 08/14/2023 5:02 PM Indications: pain Details: 22 G 1.5 in needle, anterolateral approach  Arthrogram: No  Medications: 3 mL lidocaine 1 %; 40 mg methylPREDNISolone acetate 40 MG/ML Outcome: tolerated well, no immediate complications Procedure, treatment alternatives, risks and benefits explained, specific risks discussed. Consent was given by the patient. Immediately prior to procedure a time out was called to verify the correct patient, procedure, equipment, support staff and site/side marked as required. Patient was prepped and draped in the usual sterile fashion.       Clinical Data: No additional findings.   Subjective: Chief Complaint  Patient presents with   Lower Back - Pain   Right Knee - Pain    HPI [name] well-known Dr. Magnus Ivan service comes in today due to back pain has been  ongoing for greater than a month.  She notes that she has pain bilateral hips and anterior thighs.  She is also having numbness tingling into her lower legs and feet.  She denies any bowel or bladder dysfunction, waking pain she has had some intentional weight loss.  No saddle anesthesia like symptoms.  She states that her pain management physician is prescribing oxycodone ibuprofen and cyclobenzaprine benzopyrene and this is giving her no relief from the back pain.  She also feels that her balance is slightly off.  She is using a cane to ambulate.  She has had no known injury.  She did state that after using exercise bike she has had increased pain down both legs.  Known osteoarthritis right knee and is requesting cortisone injection last injection was on 04/16/2023.  States the injection was helpful. Patient reports that she is discussing a spinal stimulator for her lumbar radiculopathy.  However she states she has had no MRI or plain films.  There was some discussion per the patient of doing epidural steroid injections but then conversation changed to his stimulator per patient.  She notes that she has been approved by her insurance company for placement of a temporary spinal stimulator however she is unsure if she wants to go through with this.  Review of Systems See HPI otherwise negative or noncontributory.  Objective: Vital Signs: LMP 04/20/2011  Physical Exam  Ortho Exam Lower extremities 5 out of 5 strength throughout the lower extremities against resistance.  Negative straight leg raise bilaterally.  She has tenderness over the lower lumbar L4-5 region.  Tenderness over the right trochanteric region.  Good range of motion bilateral hips.  She has tight hamstrings and comes within 2 inches of touching her toes with lumbar flexion and extension lumbar spine is normal.  Specialty Comments:  No specialty comments available.  Imaging: XR Lumbar Spine 2-3 Views  Result Date: 08/14/2023 Finding  2 views: No acute fractures.  Disc space narrowing at L4-5 with a grade 1 anterior listhesis L4 on 5.  Lower facet lumbar changes.  Normal lordotic curvature.  No acute fractures acute findings.    PMFS History: Patient Active Problem List   Diagnosis Date Noted   Visit for routine gyn exam 02/25/2022   Vitamin D deficiency 02/21/2021   Essential hypertension 02/21/2021   Pure hypercholesterolemia 02/21/2021   Class 3 severe obesity with serious comorbidity and body mass index (BMI) of 50.0 to 59.9 in adult [facility]) 06/13/2020   Prediabetes 06/13/2020   Leg pain 06/13/2020   Unilateral primary osteoarthritis, right knee 02/02/2020   Status post total left knee replacement 01/17/2017   Unilateral primary osteoarthritis, left knee 12/05/2016   Degenerative disc disease, cervical    Past Medical History:  Diagnosis Date   Anxiety    Asthma    Back pain    Chronic pain    DDD (degenerative disc disease), lumbar    Degenerative disc disease    Degenerative disc disease, cervical    Degenerative disc disease, cervical    Fibroid    Fibromyalgia    Headache    regular   Heartburn    Hypertension    Joint pain    Localized swelling of both lower legs    Osteoarthritis    Other specified disorders of thyroid    Rheumatoid arthritis ([facility])    Sleep apnea    mild, patient does not use mask   SOB (shortness of breath)     Family History  Problem Relation Age of Onset   Cancer Mother    Hypertension Mother    Eating disorder Mother    Obesity Mother    Diabetes Father    Heart disease Father    High Cholesterol Father    Kidney disease Father    Alcoholism Father    Eating disorder Father    Cancer Sister    Cancer Brother     Past Surgical History:  Procedure Laterality Date   BACK SURGERY     JOINT REPLACEMENT     NOVASURE ABLATION     TOTAL KNEE ARTHROPLASTY Left 01/17/2017   Procedure: LEFT TOTAL KNEE ARTHROPLASTY;  Surgeon: [name], MD;  Location: [facility];  Service: Orthopedics;  Laterality: Left;   TUBAL LIGATION     Social History   Occupational History   Occupation: n/a  Tobacco Use   Smoking status: Never   Smokeless tobacco: Never  Vaping Use   Vaping status: Never Used  Substance and Sexual Activity   Alcohol use: Not Currently   Drug use: No   Sexual activity: Yes    Birth control/protection: Surgical

## 2023-08-19 ENCOUNTER — Other Ambulatory Visit: Payer: Self-pay

## 2023-08-19 DIAGNOSIS — M5442 Lumbago with sciatica, left side: Secondary | ICD-10-CM

## 2023-08-20 ENCOUNTER — Ambulatory Visit (INDEPENDENT_AMBULATORY_CARE_PROVIDER_SITE_OTHER): Payer: 59 | Admitting: Plastic Surgery

## 2023-08-20 VITALS — Wt 312.0 lb

## 2023-08-20 DIAGNOSIS — M793 Panniculitis, unspecified: Secondary | ICD-10-CM | POA: Diagnosis not present

## 2023-08-20 NOTE — Progress Notes (Signed)
[name] returns today for routine follow-up.  She is still interested in a panniculectomy and has been losing weight with her weight today at 312.  She is still being evaluated for the etiology of her back pain.  Will continue to work on weight loss, her goal is a BMI of 40- around a weight of 250  Follow up in 2 months

## 2023-08-25 ENCOUNTER — Telehealth: Payer: Self-pay | Admitting: Physician Assistant

## 2023-08-25 NOTE — Telephone Encounter (Signed)
I called and talked to the pt. I told her it has been taking a couple weeks to get things scheduled. She stated she feels her MRI is urgent due to pain she is having. She would like to get this done asap. I told her I would send a message to our procedure scheduler to see if anything can be done. She stated understanding to this.

## 2023-08-25 NOTE — Telephone Encounter (Signed)
Pt called about mri scheduling. Pt advised to give a call back please.  Trudie Reed [phone]

## 2023-08-26 NOTE — Telephone Encounter (Signed)
LMTRC

## 2023-08-26 NOTE — Telephone Encounter (Signed)
Pt called pt and states she does not want to go to [facility] so states she will just stay with [facility]

## 2023-09-12 ENCOUNTER — Telehealth: Payer: Self-pay | Admitting: Physician Assistant

## 2023-09-12 ENCOUNTER — Other Ambulatory Visit: Payer: Self-pay

## 2023-09-12 MED ORDER — DIAZEPAM 5 MG PO TABS: ORAL_TABLET | ORAL | 0 refills | Status: DC

## 2023-09-12 NOTE — Telephone Encounter (Signed)
Patient called and said that it was supposed to be some medication sent in for her before she has this MRI. CB#[phone]

## 2023-09-12 NOTE — Telephone Encounter (Signed)
Patient aware this was called in for her  

## 2023-09-14 ENCOUNTER — Ambulatory Visit
Admission: RE | Admit: 2023-09-14 | Discharge: 2023-09-14 | Disposition: A | Discharge status: Home / Self Care | Payer: 59 | Source: Ambulatory Visit | Attending: Orthopaedic Surgery | Admitting: Orthopaedic Surgery

## 2023-09-14 DIAGNOSIS — M5441 Lumbago with sciatica, right side: Secondary | ICD-10-CM

## 2023-09-19 ENCOUNTER — Telehealth: Payer: Self-pay | Admitting: Physician Assistant

## 2023-09-19 ENCOUNTER — Telehealth: Payer: Self-pay

## 2023-09-19 NOTE — Telephone Encounter (Signed)
Pt called stating she'd like a back brace for her lower back pain. Pt advised to give her a call she has a few questions regarding xrays please.Trudie Reed [phone]

## 2023-09-22 ENCOUNTER — Telehealth: Payer: Self-pay | Admitting: Orthopaedic Surgery

## 2023-09-22 NOTE — Telephone Encounter (Signed)
Pt has gotten MRI results and would like to know if she can get a call to discuss results or if she needs an appt please advise

## 2023-09-22 NOTE — Telephone Encounter (Signed)
Dr. Magnus Ivan stated he would call patient with results.

## 2023-09-23 NOTE — Telephone Encounter (Signed)
Error

## 2023-09-29 ENCOUNTER — Telehealth: Payer: Self-pay | Admitting: Orthopaedic Surgery

## 2023-09-29 NOTE — Telephone Encounter (Signed)
Patient aware of the below message

## 2023-09-29 NOTE — Telephone Encounter (Signed)
Pt made an appt with Dr Magnus Ivan for MRI follow up per [facility] message. Pt also states she has an appt with pain clinic for back stimulator and asking if it's ok. Her appt is 10/15 at 7:30 am .Please call pt at [phone].

## 2023-10-02 ENCOUNTER — Ambulatory Visit: Payer: 59 | Admitting: Orthopaedic Surgery

## 2023-10-02 ENCOUNTER — Encounter: Payer: Self-pay | Admitting: Orthopaedic Surgery

## 2023-10-02 DIAGNOSIS — M5442 Lumbago with sciatica, left side: Secondary | ICD-10-CM

## 2023-10-02 DIAGNOSIS — M5441 Lumbago with sciatica, right side: Secondary | ICD-10-CM | POA: Diagnosis not present

## 2023-10-02 NOTE — Progress Notes (Signed)
The patient comes in today to go over MRI of her lumbar spine.  She has also been on a significant weight loss journey and is lost about 50 pounds.  She is down to around 307 pounds she states.  She was going to have a spinal cord stimulator last week but the pain specialist had a significant difficulty getting that stimulator in.  MRI of her lumbar spine was obtained and does show facet arthritis at multiple levels but there is no compressive issues that are seen with the nerves in the central canal or the foramina at all of those levels.  She does have pain to the midline of her lumbar spine going all the way down to the lower aspect of the spine and pelvis.  Both hips move smoothly and fluidly.  I would like her to continue on her weight loss journey and we should also set her up for outpatient physical therapy to work on any modalities that can decrease her back pain.  She also gets pain in both her hips and thighs and I think this is more related to her weight loss journey combined with her back pain.  Again her hips move smoothly and fluidly.  I think if she can work on modalities that can decrease her back pain and strengthen her back that would help her the most.  I would like to see her back in 6 weeks to see how she is doing overall.

## 2023-10-03 ENCOUNTER — Other Ambulatory Visit: Payer: Self-pay

## 2023-10-03 DIAGNOSIS — M5441 Lumbago with sciatica, right side: Secondary | ICD-10-CM

## 2023-10-06 ENCOUNTER — Ambulatory Visit: Payer: 59 | Admitting: Plastic Surgery

## 2023-10-06 ENCOUNTER — Encounter: Payer: Self-pay | Admitting: Plastic Surgery

## 2023-10-06 VITALS — Wt 305.2 lb

## 2023-10-06 DIAGNOSIS — M793 Panniculitis, unspecified: Secondary | ICD-10-CM | POA: Diagnosis not present

## 2023-10-06 NOTE — Progress Notes (Signed)
[name] returns today for follow-up.  She is lost 7 pounds since I saw her last time.  She has struggled with her weight loss but she has continued to try and has continued to work to get to where she needs to be.  I have asked her to lose 20 more pounds.  She will follow-up with me before Christmas at which time if she has lost 15 of those pounds I will schedule her for a panniculectomy.

## 2023-10-13 ENCOUNTER — Encounter: Payer: Self-pay | Admitting: Physical Therapy

## 2023-10-13 ENCOUNTER — Ambulatory Visit: Payer: 59 | Attending: Orthopaedic Surgery | Admitting: Physical Therapy

## 2023-10-13 ENCOUNTER — Other Ambulatory Visit: Payer: Self-pay

## 2023-10-13 DIAGNOSIS — M5441 Lumbago with sciatica, right side: Secondary | ICD-10-CM | POA: Insufficient documentation

## 2023-10-13 DIAGNOSIS — M6281 Muscle weakness (generalized): Secondary | ICD-10-CM | POA: Diagnosis present

## 2023-10-13 DIAGNOSIS — M5459 Other low back pain: Secondary | ICD-10-CM | POA: Insufficient documentation

## 2023-10-13 DIAGNOSIS — R2689 Other abnormalities of gait and mobility: Secondary | ICD-10-CM | POA: Diagnosis present

## 2023-10-13 DIAGNOSIS — M5442 Lumbago with sciatica, left side: Secondary | ICD-10-CM | POA: Insufficient documentation

## 2023-10-13 NOTE — Therapy (Signed)
OUTPATIENT PHYSICAL THERAPY THORACOLUMBAR EVALUATION   Patient Name: [name] MRN: [id number] DOB:[birth year], 62 y.o., female Today's Date: 10/13/2023  END OF SESSION:  PT End of Session - 10/13/23 0805     Visit Number 1    Number of Visits 17    Date for PT Re-Evaluation 12/08/23    Authorization Type [employer]    PT Start Time 0803    PT Stop Time 0846    PT Time Calculation (min) 43 min    Activity Tolerance Patient limited by pain;Patient tolerated treatment well;No increased pain    Behavior During Therapy WFL for tasks assessed/performed             Past Medical History:  Diagnosis Date   Anxiety    Asthma    Back pain    Chronic pain    DDD (degenerative disc disease), lumbar    Degenerative disc disease    Degenerative disc disease, cervical    Degenerative disc disease, cervical    Fibroid    Fibromyalgia    Headache    regular   Heartburn    Hypertension    Joint pain    Localized swelling of both lower legs    Osteoarthritis    Other specified disorders of thyroid    Rheumatoid arthritis (HCC)    Sleep apnea    mild, patient does not use mask   SOB (shortness of breath)    Past Surgical History:  Procedure Laterality Date   BACK SURGERY     JOINT REPLACEMENT     NOVASURE ABLATION     TOTAL KNEE ARTHROPLASTY Left 01/17/2017   Procedure: LEFT TOTAL KNEE ARTHROPLASTY;  Surgeon: [name], MD;  Location: [facility];  Service: Orthopedics;  Laterality: Left;   TUBAL LIGATION     Patient Active Problem List   Diagnosis Date Noted   Visit for routine gyn exam 02/25/2022   Vitamin D deficiency 02/21/2021   Essential hypertension 02/21/2021   Pure hypercholesterolemia 02/21/2021   Class 3 severe obesity with serious comorbidity and body mass index (BMI) of 50.0 to 59.9 in adult [facility]) 06/13/2020   Prediabetes 06/13/2020   Leg pain 06/13/2020   Unilateral primary osteoarthritis, right knee 02/02/2020   Status post total left knee  replacement 01/17/2017   Unilateral primary osteoarthritis, left knee 12/05/2016   Degenerative disc disease, cervical     PCP: Fleet Contras, MD   REFERRING PROVIDER: [name], MD   REFERRING DIAG: [phone] (ICD-10-CM) - Low back pain with bilateral sciatica, unspecified back pain laterality, unspecified chronicity   Rationale for Evaluation and Treatment: Rehabilitation  THERAPY DIAG:  Other low back pain  Muscle weakness (generalized)  Other abnormalities of gait and mobility  ONSET DATE: >20 years, worsening 5-6 months ago  SUBJECTIVE:  SUBJECTIVE STATEMENT: Pt endorses chronic history of low back pain, and mobility deficits + weakness since knee surgery several years ago. Pt states about 7-8 months ago she began trying to lose weight more aggressively, and 5-6 months ago began developing increase in her back pain. She reports chronic balance issues and has frequent falls, but denies any injuries with these. She describes symptoms as slowly worsening since onset but no changes since MRI in early October. She denies any numbness/tingling but does describe LE pain that is fairly symmetrical. States pain management attempted spinal cord stimulator but was unsuccessful. She describes pain with general activity that improves with rest - of note, she states she has received assistance from [facility] aide since her knee surgery and feels her surgical limb has been weaker since, tends to give out at times (chronic).   PERTINENT HISTORY:  Anxiety, asthma, fibromyalgia, headaches, HTN, RA, sleep apnea  PAIN:  Are you having pain: 7/10 Location/description: tailbone, posterolateral hips, anterior thighs Best-worst over past week: 7-10/10  - aggravating factors: general activity,  standing/walking, cooking, lower body dressing, housework/cleaning - Easing factors: sitting or lying down, medication  PRECAUTIONS: fall risk   WEIGHT BEARING RESTRICTIONS: No  FALLS:  Has patient fallen in last 6 months? Yes. Number of falls ~4, loses balance  LIVING ENVIRONMENT: Has a rollator and QC, uses both. Has shower chair and grab bars. Has a reacher to assist with lower body dressing Lives with son, has home health aide who assists with housework and dressing Home environment level, no stairs Drives  OCCUPATION: not working  PLOF: modified independent, has had HH aide since knee surgery (2016 or 2018 per pt report)  PATIENT GOALS: reduce pain, improve activity levels  NEXT MD VISIT: December  OBJECTIVE:  Note: Objective measures were completed at Evaluation unless otherwise noted.  DIAGNOSTIC FINDINGS:  Lumbar MRI 09/22/23  "IMPRESSION: 1. L2-3: Shallow protrusion of the disc with slight upward turning behind the inferior endplate of L2. Bilateral facet degeneration with facet and ligamentous hypertrophy. Mild multifactorial stenosis at this level. Definite focal neural compression is not demonstrated however. 2. L3-4: Bulging of the disc slightly more prominent towards the right. Mild facet and ligamentous hypertrophy. No compressive stenosis. 3. L4-5: Bilateral facet osteoarthritis with 2 mm of degenerative anterolisthesis. Minimal bulging of the disc. No compressive stenosis. Facet arthritis could be painful. 4. L5-S1: Mild bulging of the disc. Mild bilateral facet osteoarthritis. No compressive stenosis or neural compression."  PATIENT SURVEYS:  FOTO 30 current, 43 predicted   SCREENING FOR RED FLAGS: Red flag screening reassuring overall, MRI as above - bowel/bladder changes: denies - bucking/overt weakness: reports chronic LLE weakness/buckling since knee surgery, no recent changes - bilateral LE numbness: denies - unintentional weight loss: denies  (has been intentional)    COGNITION: Overall cognitive status: Within functional limits for tasks assessed     SENSATION: Light touch intact although slightly lessened R lateral knee No clonus either LE  No hofman or tromner sign either UE    POSTURE: forward flexed posture, rounded shoulders, forward head  PALPATION: deferred  LUMBAR ROM:   AROM eval  Flexion Mid shin without pain  Extension Pain past neutral   Right lateral flexion   Left lateral flexion   Right rotation 75% s  Left rotation 50% s   (Blank rows = not tested) (Key: WFL = within functional limits not formally assessed, * = concordant pain, s = stiffness/stretching sensation, NT = not tested)   LOWER EXTREMITY  ROM:     Active  Right eval Left eval  Hip flexion    Hip extension    Hip internal rotation    Hip external rotation    Knee extension    Knee flexion    (Blank rows = not tested) (Key: WFL = within functional limits not formally assessed, * = concordant pain, s = stiffness/stretching sensation, NT = not tested)  Comments:    LOWER EXTREMITY MMT:    MMT Right eval Left eval  Hip flexion 3- 3-  Hip abduction (modified sitting) 4 4  Hip internal rotation    Hip external rotation    Knee flexion 4 4  Knee extension 4- 3+  Ankle dorsiflexion     (Blank rows = not tested) (Key: WFL = within functional limits not formally assessed, * = concordant pain, s = stiffness/stretching sensation, NT = not tested)  Comments:    LUMBAR SPECIAL TESTS:  deferred  FUNCTIONAL TESTS:  30secSTS: 7 repetitions heavy UE support and noted lean to R   GAIT: Distance walked: within clinic Assistive device utilized: Quad cane large base Level of assistance: Modified independence Comments: significant antalgic gait, reduced gait speed/cadence, increased trunk sway, intermittent steadying on nearby objects  TODAY'S TREATMENT:                                                                                                                               [facility] Adult PT Treatment:                                                DATE: 10/13/23 Therapeutic Exercise: Practice reps with STS, LAQ, marches BIL; HEP handout + education on appropriate performance, rationale for interventions    PATIENT EDUCATION:  Education details: Pt education on PT impairments, prognosis, and POC. Informed consent. Rationale for interventions, safe/appropriate HEP performance Person educated: Patient Education method: Explanation, Demonstration, Tactile cues, Verbal cues, and Handouts Education comprehension: verbalized understanding, returned demonstration, verbal cues required, tactile cues required, and needs further education    HOME EXERCISE PROGRAM: Access Code: [email or web address] URL: [email or web address] Date: 10/13/2023 Prepared by: Fransisco Hertz  Exercises - Seated March  - 2-3 x daily - 1 sets - 5 reps - Seated Long Arc Quad  - 2-3 x daily - 1 sets - 5 reps - Sit to Stand with Armchair  - 2-3 x daily - 1 sets - 5 reps  ASSESSMENT:  CLINICAL IMPRESSION: Patient is a pleasant 62 y.o. woman who was seen today for physical therapy evaluation and treatment for chronic low back pain worsening over past 5-6 months. She endorses increased pain with general activity but particularly with standing/walking in more upright posture. On exam she demonstrates good lumbar flexion but difficulty tolerating lumbar extension past neutral. Global LE weakness with MMT that is  nonpainful, pt endorses chronic history of LE weakness since knee surgery several years ago. 30secSTS nonpainful per pt report but is indicative of reduced activity/exercise tolerance and does demonstrate altered kinematics with weight shift to R. Tolerates exam and HEP well without adverse event or increase in pain.  Recommend trial of skilled PT to address aforementioned deficits with aim of improving functional tolerance and reducing pain with  typical activities. Pt departs today's session in no acute distress, all voiced concerns/questions addressed appropriately from PT perspective.    OBJECTIVE IMPAIRMENTS: Abnormal gait, decreased activity tolerance, decreased balance, decreased endurance, decreased mobility, difficulty walking, decreased ROM, decreased strength, impaired perceived functional ability, improper body mechanics, postural dysfunction, and pain.   ACTIVITY LIMITATIONS: carrying, lifting, bending, standing, squatting, transfers, and locomotion level  PARTICIPATION LIMITATIONS: meal prep, cleaning, laundry, and community activity  PERSONAL FACTORS: Age, Time since onset of injury/illness/exacerbation, and 3+ comorbidities: Anxiety, asthma, fibromyalgia, headaches, HTN, RA, sleep apnea  are also affecting patient's functional outcome.   REHAB POTENTIAL: Fair given chronicity and comorbidities  CLINICAL DECISION MAKING: Evolving/moderate complexity  EVALUATION COMPLEXITY: Moderate   GOALS: Goals reviewed with patient? Yes  SHORT TERM GOALS: Target date: 11/10/2023  Pt will demonstrate appropriate understanding and performance of initially prescribed HEP in order to facilitate improved independence with management of symptoms.  Baseline: HEP provided on eval Goal status: INITIAL   2. Pt will score greater than or equal to 36 on FOTO in order to demonstrate improved perception of function due to symptoms.  Baseline: 30  Goal status: INITIAL    LONG TERM GOALS: Target date: 12/08/2023   Pt will score 43 on FOTO in order to demonstrate improved perception of functional status due to symptoms.  Baseline: 30 Goal status: INITIAL  2.  Pt will demonstrate at least 50% normal lumbar extension AROM in order to demonstrate improved tolerance to functional movement patterns.   Baseline: pain past neutral Goal status: INITIAL  3.  Pt will demonstrate global LE MMT of at least 4/5 throughout in order to demonstrate  improved strength for functional movements and reduce fall risk.  Baseline: see MMT chart above Goal status: INITIAL  4. Pt will be able to perform at least 10 repetitions during 30 second sit to stand test in order to demonstrate improved exercise/activity tolerance (cutoff score for low exercise tolerance 18 repetitions in males and 16 repetitions in females per Georgina Snell al 2024)  Baseline: 7 repetitions, heavy UE support  Goal status: INITIAL    5. Pt will report at least 50% decrease in overall pain levels in past week in order to facilitate improved tolerance to basic ADLs/mobility.   Baseline: 7-10/10  Goal status: INITIAL    6. Pt will demonstrate appropriate performance of final prescribed HEP in order to facilitate improved self-management of symptoms post-discharge.   Baseline: initial HEP prescribed  Goal status: INITIAL    PLAN:  PT FREQUENCY: 1-2x/week  PT DURATION: 8 weeks  PLANNED INTERVENTIONS: 97164- PT Re-evaluation, 97110-Therapeutic exercises, 97530- Therapeutic activity, 97112- Neuromuscular re-education, 97535- Self Care, 01027- Manual therapy, [phone]- Gait training, [phone]- Aquatic Therapy, Patient/Family education, Balance training, Stair training, Taping, Dry Needling, Joint mobilization, Spinal mobilization, Cryotherapy, and Moist heat.  PLAN FOR NEXT SESSION: Review/update HEP PRN. Work on [employer] exercises as appropriate with emphasis on gradually improving extension tolerance, LE strengthening within pt tolerance. Symptom modification strategies as indicated/appropriate.    Ashley Murrain PT, DPT 10/13/2023 10:55 AM

## 2023-10-14 ENCOUNTER — Ambulatory Visit: Payer: 59 | Admitting: Physical Therapy

## 2023-10-20 NOTE — Therapy (Signed)
OUTPATIENT PHYSICAL THERAPY TREATMENT NOTE   Patient Name: KASAUNDRA FAHRNEY MRN: [id number] DOB:1961/12/05, 62 y.o., female Today's Date: 10/21/2023  END OF SESSION:  PT End of Session - 10/21/23 1111     Visit Number 2    Number of Visits 17    Date for PT Re-Evaluation 12/08/23    Authorization Type [employer]    PT Start Time 1112   late check in   PT Stop Time 1152    PT Time Calculation (min) 40 min    Activity Tolerance Patient tolerated treatment well;No increased pain    Behavior During Therapy WFL for tasks assessed/performed              Past Medical History:  Diagnosis Date   Anxiety    Asthma    Back pain    Chronic pain    DDD (degenerative disc disease), lumbar    Degenerative disc disease    Degenerative disc disease, cervical    Degenerative disc disease, cervical    Fibroid    Fibromyalgia    Headache    regular   Heartburn    Hypertension    Joint pain    Localized swelling of both lower legs    Osteoarthritis    Other specified disorders of thyroid    Rheumatoid arthritis (HCC)    Sleep apnea    mild, patient does not use mask   SOB (shortness of breath)    Past Surgical History:  Procedure Laterality Date   BACK SURGERY     JOINT REPLACEMENT     NOVASURE ABLATION     TOTAL KNEE ARTHROPLASTY Left 01/17/2017   Procedure: LEFT TOTAL KNEE ARTHROPLASTY;  Surgeon: [name], MD;  Location: [facility];  Service: Orthopedics;  Laterality: Left;   TUBAL LIGATION     Patient Active Problem List   Diagnosis Date Noted   Visit for routine gyn exam 02/25/2022   Vitamin D deficiency 02/21/2021   Essential hypertension 02/21/2021   Pure hypercholesterolemia 02/21/2021   Class 3 severe obesity with serious comorbidity and body mass index (BMI) of 50.0 to 59.9 in adult [facility]) 06/13/2020   Prediabetes 06/13/2020   Leg pain 06/13/2020   Unilateral primary osteoarthritis, right knee 02/02/2020   Status post total left knee replacement 01/17/2017    Unilateral primary osteoarthritis, left knee 12/05/2016   Degenerative disc disease, cervical     PCP: Fleet Contras, MD   REFERRING PROVIDER: [name], MD   REFERRING DIAG: [phone] (ICD-10-CM) - Low back pain with bilateral sciatica, unspecified back pain laterality, unspecified chronicity   Rationale for Evaluation and Treatment: Rehabilitation  THERAPY DIAG:  Other low back pain  Muscle weakness (generalized)  Other abnormalities of gait and mobility  ONSET DATE: >20 years, worsening 5-6 months ago  SUBJECTIVE:  Per eval - Pt endorses chronic history of low back pain, and mobility deficits + weakness since knee surgery several years ago. Pt states about 7-8 months ago she began trying to lose weight more aggressively, and 5-6 months ago began developing increase in her back pain. She reports chronic balance issues and has frequent falls, but denies any injuries with these. She describes symptoms as slowly worsening since onset but no changes since MRI in early October. She denies any numbness/tingling but does describe LE pain that is fairly symmetrical. States pain management attempted spinal cord stimulator but was unsuccessful. She describes pain with general activity that improves with rest - of note, she states she has received assistance from [facility] aide since her knee surgery and feels her surgical limb has been weaker since, tends to give out at times (chronic).  SUBJECTIVE STATEMENT: 10/21/2023 Pt arrives and reports continued pain with standing, feels good when she is sitting or lying down. Has been doing HEP and states she does have some pain with STS   PERTINENT HISTORY:  Anxiety, asthma, fibromyalgia, headaches, HTN, RA, sleep apnea  PAIN:  Are you having pain:  8/10 Location/description: tailbone, posterolateral hips, anterior thighs Best-worst over past week: 7-10/10  - aggravating factors: general activity, standing/walking, cooking, lower body dressing, housework/cleaning - Easing factors: sitting or lying down, medication  PRECAUTIONS: fall risk   WEIGHT BEARING RESTRICTIONS: No  FALLS:  Has patient fallen in last 6 months? Yes. Number of falls ~4, loses balance  LIVING ENVIRONMENT: Has a rollator and QC, uses both. Has shower chair and grab bars. Has a reacher to assist with lower body dressing Lives with son, has home health aide who assists with housework and dressing Home environment level, no stairs Drives  OCCUPATION: not working  PLOF: modified independent, has had HH aide since knee surgery (2016 or 2018 per pt report)  PATIENT GOALS: reduce pain, improve activity levels  NEXT MD VISIT: December  OBJECTIVE:  Note: Objective measures were completed at Evaluation unless otherwise noted.  DIAGNOSTIC FINDINGS:  Lumbar MRI 09/22/23  "IMPRESSION: 1. L2-3: Shallow protrusion of the disc with slight upward turning behind the inferior endplate of L2. Bilateral facet degeneration with facet and ligamentous hypertrophy. Mild multifactorial stenosis at this level. Definite focal neural compression is not demonstrated however. 2. L3-4: Bulging of the disc slightly more prominent towards the right. Mild facet and ligamentous hypertrophy. No compressive stenosis. 3. L4-5: Bilateral facet osteoarthritis with 2 mm of degenerative anterolisthesis. Minimal bulging of the disc. No compressive stenosis. Facet arthritis could be painful. 4. L5-S1: Mild bulging of the disc. Mild bilateral facet osteoarthritis. No compressive stenosis or neural compression."  PATIENT SURVEYS:  FOTO 30 current, 43 predicted   SCREENING FOR RED FLAGS: Red flag screening reassuring overall, MRI as above - bowel/bladder changes: denies - bucking/overt  weakness: reports chronic LLE weakness/buckling since knee surgery, no recent changes - bilateral LE numbness: denies - unintentional weight loss: denies (has been intentional)    COGNITION: Overall cognitive status: Within functional limits for tasks assessed     SENSATION: Light touch intact although slightly lessened R lateral knee No clonus either LE  No hofman or tromner sign either UE    POSTURE: forward flexed posture, rounded shoulders, forward head  PALPATION: deferred  LUMBAR ROM:   AROM eval  Flexion Mid shin without pain  Extension Pain past neutral   Right lateral flexion   Left lateral flexion   Right rotation 75% s  Left  rotation 50% s   (Blank rows = not tested) (Key: WFL = within functional limits not formally assessed, * = concordant pain, s = stiffness/stretching sensation, NT = not tested)   LOWER EXTREMITY ROM:     Active  Right eval Left eval  Hip flexion    Hip extension    Hip internal rotation    Hip external rotation    Knee extension    Knee flexion    (Blank rows = not tested) (Key: WFL = within functional limits not formally assessed, * = concordant pain, s = stiffness/stretching sensation, NT = not tested)  Comments:    LOWER EXTREMITY MMT:    MMT Right eval Left eval  Hip flexion 3- 3-  Hip abduction (modified sitting) 4 4  Hip internal rotation    Hip external rotation    Knee flexion 4 4  Knee extension 4- 3+  Ankle dorsiflexion     (Blank rows = not tested) (Key: WFL = within functional limits not formally assessed, * = concordant pain, s = stiffness/stretching sensation, NT = not tested)  Comments:    LUMBAR SPECIAL TESTS:  deferred  FUNCTIONAL TESTS:  30secSTS: 7 repetitions heavy UE support and noted lean to R   GAIT: Distance walked: within clinic Assistive device utilized: Quad cane large base Level of assistance: Modified independence Comments: significant antalgic gait, reduced gait speed/cadence,  increased trunk sway, intermittent steadying on nearby objects  TODAY'S TREATMENT:                                                                                                                              [facility] Adult PT Treatment:                                                DATE: 10/21/23 Therapeutic Exercise: LAQ 2x8 cues for foot positioning and pacing Seated march 2x8 BIL STS from rollator x5 with UE support STS from raised mat no UE support 2x5 Seated adductor iso 2x10 Red band paloff 3x8 BIL  HEP update + education/handout; discussed safe performance at home as she states she has been doing STS from rollator at times and one of the brakes is broken, observed in clinic, advised her to perform from more appropriate/sturdy surface  Therapeutic Activity: Education/discussion re: progression of activities, monitoring of symptoms and modifying activities accordingly, relevant anatomy/physiology as pertains to symptom behavior and functional mobility   [facility] Adult PT Treatment:                                                DATE: 10/13/23 Therapeutic Exercise: Practice reps with STS, LAQ, marches BIL; HEP handout + education  on appropriate performance, rationale for interventions    PATIENT EDUCATION:  Education details: rationale for interventions, HEP  Person educated: Patient Education method: Explanation, Demonstration, Tactile cues, Verbal cues Education comprehension: verbalized understanding, returned demonstration, verbal cues required, tactile cues required, and needs further education    HOME EXERCISE PROGRAM: Access Code: [email or web address] URL: [email or web address] Date: 10/21/2023 Prepared by: Fransisco Hertz  Exercises - Seated March  - 2-3 x daily - 1 sets - 8 reps - Seated Long Arc Quad  - 2-3 x daily - 1 sets - 8 reps - Seated Hip Adduction Isometrics with Ball  - 2-3 x daily - 1 sets - 8 reps - Sit to Stand with Armchair  - 2-3 x daily - 1 sets - 5  reps  ASSESSMENT:  CLINICAL IMPRESSION: 10/21/2023 Pt arrives w/ 8/10 pain, reports HEP adherence but notes STS remains painful, overall about the same as initial eval. Today able to expand volume with familiar exercises and incorporate additional core/hip training which pt tolerates well. Does have some transient fatigue and discomfort but no increase in resting pain reported, endorses improved symptoms in standing on departure. No adverse events. HEP update as above, education on safe STS performance from appropriate surface to mitigate fall risk. Recommend continuing along current POC in order to address relevant deficits and improve functional tolerance. Pt departs today's session in no acute distress, all voiced questions/concerns addressed appropriately from PT perspective.    Per eval - Patient is a pleasant 62 y.o. woman who was seen today for physical therapy evaluation and treatment for chronic low back pain worsening over past 5-6 months. She endorses increased pain with general activity but particularly with standing/walking in more upright posture. On exam she demonstrates good lumbar flexion but difficulty tolerating lumbar extension past neutral. Global LE weakness with MMT that is nonpainful, pt endorses chronic history of LE weakness since knee surgery several years ago. 30secSTS nonpainful per pt report but is indicative of reduced activity/exercise tolerance and does demonstrate altered kinematics with weight shift to R. Tolerates exam and HEP well without adverse event or increase in pain.  Recommend trial of skilled PT to address aforementioned deficits with aim of improving functional tolerance and reducing pain with typical activities. Pt departs today's session in no acute distress, all voiced concerns/questions addressed appropriately from PT perspective.    OBJECTIVE IMPAIRMENTS: Abnormal gait, decreased activity tolerance, decreased balance, decreased endurance, decreased mobility,  difficulty walking, decreased ROM, decreased strength, impaired perceived functional ability, improper body mechanics, postural dysfunction, and pain.   ACTIVITY LIMITATIONS: carrying, lifting, bending, standing, squatting, transfers, and locomotion level  PARTICIPATION LIMITATIONS: meal prep, cleaning, laundry, and community activity  PERSONAL FACTORS: Age, Time since onset of injury/illness/exacerbation, and 3+ comorbidities: Anxiety, asthma, fibromyalgia, headaches, HTN, RA, sleep apnea  are also affecting patient's functional outcome.   REHAB POTENTIAL: Fair given chronicity and comorbidities  CLINICAL DECISION MAKING: Evolving/moderate complexity  EVALUATION COMPLEXITY: Moderate   GOALS: Goals reviewed with patient? Yes  SHORT TERM GOALS: Target date: 11/10/2023  Pt will demonstrate appropriate understanding and performance of initially prescribed HEP in order to facilitate improved independence with management of symptoms.  Baseline: HEP provided on eval Goal status: INITIAL   2. Pt will score greater than or equal to 36 on FOTO in order to demonstrate improved perception of function due to symptoms.  Baseline: 30  Goal status: INITIAL    LONG TERM GOALS: Target date: 12/08/2023   Pt will score 43 on FOTO in order  to demonstrate improved perception of functional status due to symptoms.  Baseline: 30 Goal status: INITIAL  2.  Pt will demonstrate at least 50% normal lumbar extension AROM in order to demonstrate improved tolerance to functional movement patterns.   Baseline: pain past neutral Goal status: INITIAL  3.  Pt will demonstrate global LE MMT of at least 4/5 throughout in order to demonstrate improved strength for functional movements and reduce fall risk.  Baseline: see MMT chart above Goal status: INITIAL  4. Pt will be able to perform at least 10 repetitions during 30 second sit to stand test in order to demonstrate improved exercise/activity tolerance  (cutoff score for low exercise tolerance 18 repetitions in males and 16 repetitions in females per Georgina Snell al 2024)  Baseline: 7 repetitions, heavy UE support  Goal status: INITIAL    5. Pt will report at least 50% decrease in overall pain levels in past week in order to facilitate improved tolerance to basic ADLs/mobility.   Baseline: 7-10/10  Goal status: INITIAL    6. Pt will demonstrate appropriate performance of final prescribed HEP in order to facilitate improved self-management of symptoms post-discharge.   Baseline: initial HEP prescribed  Goal status: INITIAL    PLAN:  PT FREQUENCY: 1-2x/week  PT DURATION: 8 weeks  PLANNED INTERVENTIONS: 97164- PT Re-evaluation, 97110-Therapeutic exercises, 97530- Therapeutic activity, 97112- Neuromuscular re-education, 97535- Self Care, 78469- Manual therapy, [phone]- Gait training, [phone]- Aquatic Therapy, Patient/Family education, Balance training, Stair training, Taping, Dry Needling, Joint mobilization, Spinal mobilization, Cryotherapy, and Moist heat.  PLAN FOR NEXT SESSION: Review/update HEP PRN. Work on [employer] exercises as appropriate with emphasis on gradually improving extension tolerance, LE strengthening within pt tolerance. Symptom modification strategies as indicated/appropriate. Pt interested in utilizing nu step    Ashley Murrain PT, DPT 10/21/2023 12:00 PM

## 2023-10-21 ENCOUNTER — Encounter: Payer: Self-pay | Admitting: Physical Therapy

## 2023-10-21 ENCOUNTER — Ambulatory Visit: Payer: 59 | Attending: Orthopaedic Surgery | Admitting: Physical Therapy

## 2023-10-21 DIAGNOSIS — M5459 Other low back pain: Secondary | ICD-10-CM

## 2023-10-21 DIAGNOSIS — R2689 Other abnormalities of gait and mobility: Secondary | ICD-10-CM

## 2023-10-21 DIAGNOSIS — M6281 Muscle weakness (generalized): Secondary | ICD-10-CM

## 2023-10-22 ENCOUNTER — Ambulatory Visit: Payer: 59 | Admitting: Plastic Surgery

## 2023-10-27 ENCOUNTER — Ambulatory Visit: Payer: 59 | Admitting: Physical Therapy

## 2023-10-27 ENCOUNTER — Encounter: Payer: Self-pay | Admitting: Physical Therapy

## 2023-10-27 DIAGNOSIS — R2689 Other abnormalities of gait and mobility: Secondary | ICD-10-CM

## 2023-10-27 DIAGNOSIS — M5459 Other low back pain: Secondary | ICD-10-CM | POA: Diagnosis not present

## 2023-10-27 DIAGNOSIS — M6281 Muscle weakness (generalized): Secondary | ICD-10-CM

## 2023-10-27 NOTE — Therapy (Signed)
OUTPATIENT PHYSICAL THERAPY TREATMENT NOTE   Patient Name: [name] MRN: [id number] DOB:[birth year], 62 y.o., female Today's Date: 10/27/2023  END OF SESSION:  PT End of Session - 10/27/23 1408     Visit Number 3    Number of Visits 17    Date for PT Re-Evaluation 12/08/23    Authorization Type [employer]    PT Start Time 0205    PT Stop Time 0243    PT Time Calculation (min) 38 min              Past Medical History:  Diagnosis Date   Anxiety    Asthma    Back pain    Chronic pain    DDD (degenerative disc disease), lumbar    Degenerative disc disease    Degenerative disc disease, cervical    Degenerative disc disease, cervical    Fibroid    Fibromyalgia    Headache    regular   Heartburn    Hypertension    Joint pain    Localized swelling of both lower legs    Osteoarthritis    Other specified disorders of thyroid    Rheumatoid arthritis (HCC)    Sleep apnea    mild, patient does not use mask   SOB (shortness of breath)    Past Surgical History:  Procedure Laterality Date   BACK SURGERY     JOINT REPLACEMENT     NOVASURE ABLATION     TOTAL KNEE ARTHROPLASTY Left 01/17/2017   Procedure: LEFT TOTAL KNEE ARTHROPLASTY;  Surgeon: [name], MD;  Location: [facility];  Service: Orthopedics;  Laterality: Left;   TUBAL LIGATION     Patient Active Problem List   Diagnosis Date Noted   Visit for routine gyn exam 02/25/2022   Vitamin D deficiency 02/21/2021   Essential hypertension 02/21/2021   Pure hypercholesterolemia 02/21/2021   Class 3 severe obesity with serious comorbidity and body mass index (BMI) of 50.0 to 59.9 in adult [facility]) 06/13/2020   Prediabetes 06/13/2020   Leg pain 06/13/2020   Unilateral primary osteoarthritis, right knee 02/02/2020   Status post total left knee replacement 01/17/2017   Unilateral primary osteoarthritis, left knee 12/05/2016   Degenerative disc disease, cervical     PCP: Fleet Contras, MD   REFERRING  PROVIDER: [name], MD   REFERRING DIAG: [phone] (ICD-10-CM) - Low back pain with bilateral sciatica, unspecified back pain laterality, unspecified chronicity   Rationale for Evaluation and Treatment: Rehabilitation  THERAPY DIAG:  Other low back pain  Muscle weakness (generalized)  Other abnormalities of gait and mobility  ONSET DATE: >20 years, worsening 5-6 months ago  SUBJECTIVE:  Per eval - Pt endorses chronic history of low back pain, and mobility deficits + weakness since knee surgery several years ago. Pt states about 7-8 months ago she began trying to lose weight more aggressively, and 5-6 months ago began developing increase in her back pain. She reports chronic balance issues and has frequent falls, but denies any injuries with these. She describes symptoms as slowly worsening since onset but no changes since MRI in early October. She denies any numbness/tingling but does describe LE pain that is fairly symmetrical. States pain management attempted spinal cord stimulator but was unsuccessful. She describes pain with general activity that improves with rest - of note, she states she has received assistance from [facility] aide since her knee surgery and feels her surgical limb has been weaker since, tends to give out at times (chronic).  SUBJECTIVE STATEMENT: 10/27/2023 Pt arrives and reports continued pain with standing, feels good when she is sitting or lying down. Has been doing HEP and states she does have some pain with STS   PERTINENT HISTORY:  Anxiety, asthma, fibromyalgia, headaches, HTN, RA, sleep apnea  PAIN:  Are you having pain: 8/10 Location/description: tailbone, posterolateral hips, anterior thighs Best-worst over past week: 7-10/10  - aggravating factors: general  activity, standing/walking, cooking, lower body dressing, housework/cleaning - Easing factors: sitting or lying down, medication  PRECAUTIONS: fall risk   WEIGHT BEARING RESTRICTIONS: No  FALLS:  Has patient fallen in last 6 months? Yes. Number of falls ~4, loses balance  LIVING ENVIRONMENT: Has a rollator and QC, uses both. Has shower chair and grab bars. Has a reacher to assist with lower body dressing Lives with son, has home health aide who assists with housework and dressing Home environment level, no stairs Drives  OCCUPATION: not working  PLOF: modified independent, has had HH aide since knee surgery (2016 or 2018 per pt report)  PATIENT GOALS: reduce pain, improve activity levels  NEXT MD VISIT: December  OBJECTIVE:  Note: Objective measures were completed at Evaluation unless otherwise noted.  DIAGNOSTIC FINDINGS:  Lumbar MRI 09/22/23  "IMPRESSION: 1. L2-3: Shallow protrusion of the disc with slight upward turning behind the inferior endplate of L2. Bilateral facet degeneration with facet and ligamentous hypertrophy. Mild multifactorial stenosis at this level. Definite focal neural compression is not demonstrated however. 2. L3-4: Bulging of the disc slightly more prominent towards the right. Mild facet and ligamentous hypertrophy. No compressive stenosis. 3. L4-5: Bilateral facet osteoarthritis with 2 mm of degenerative anterolisthesis. Minimal bulging of the disc. No compressive stenosis. Facet arthritis could be painful. 4. L5-S1: Mild bulging of the disc. Mild bilateral facet osteoarthritis. No compressive stenosis or neural compression."  PATIENT SURVEYS:  FOTO 30 current, 43 predicted   SCREENING FOR RED FLAGS: Red flag screening reassuring overall, MRI as above - bowel/bladder changes: denies - bucking/overt weakness: reports chronic LLE weakness/buckling since knee surgery, no recent changes - bilateral LE numbness: denies - unintentional weight  loss: denies (has been intentional)    COGNITION: Overall cognitive status: Within functional limits for tasks assessed     SENSATION: Light touch intact although slightly lessened R lateral knee No clonus either LE  No hofman or tromner sign either UE    POSTURE: forward flexed posture, rounded shoulders, forward head  PALPATION: deferred  LUMBAR ROM:   AROM eval  Flexion Mid shin without pain  Extension Pain past neutral   Right lateral flexion   Left lateral flexion   Right rotation 75% s  Left  rotation 50% s   (Blank rows = not tested) (Key: WFL = within functional limits not formally assessed, * = concordant pain, s = stiffness/stretching sensation, NT = not tested)   LOWER EXTREMITY ROM:     Active  Right eval Left eval  Hip flexion    Hip extension    Hip internal rotation    Hip external rotation    Knee extension    Knee flexion    (Blank rows = not tested) (Key: WFL = within functional limits not formally assessed, * = concordant pain, s = stiffness/stretching sensation, NT = not tested)  Comments:    LOWER EXTREMITY MMT:    MMT Right eval Left eval  Hip flexion 3- 3-  Hip abduction (modified sitting) 4 4  Hip internal rotation    Hip external rotation    Knee flexion 4 4  Knee extension 4- 3+  Ankle dorsiflexion     (Blank rows = not tested) (Key: WFL = within functional limits not formally assessed, * = concordant pain, s = stiffness/stretching sensation, NT = not tested)  Comments:    LUMBAR SPECIAL TESTS:  deferred  FUNCTIONAL TESTS:  30secSTS: 7 repetitions heavy UE support and noted lean to R  10/27/23: 9 reps, without UE support, equal WB  GAIT: Distance walked: within clinic Assistive device utilized: Quad cane large base Level of assistance: Modified independence Comments: significant antalgic gait, reduced gait speed/cadence, increased trunk sway, intermittent steadying on nearby objects  TODAY'S TREATMENT:                                                                                                                               [facility] Adult PT Treatment:                                                DATE: 10/27/23 Therapeutic Exercise: Nustep L4 UE/LE x 5 minutes  PPT 5 sec 2 x 10  Supine March with PPT 10 x 2  LTR x 10 PPT to Small Bridge x 10 SKTC with sheet 10 sec x 3 each  STS in 30 sec =9 without UE , standard at table      [name] Adult PT Treatment:                                                DATE: 10/21/23 Therapeutic Exercise: LAQ 2x8 cues for foot positioning and pacing Seated march 2x8 BIL STS from rollator x5 with UE support STS from raised mat no UE support 2x5 Seated adductor iso 2x10 Red band paloff 3x8 BIL  HEP update + education/handout; discussed safe performance at home as she states she  has been doing STS from rollator at times and one of the brakes is broken, observed in clinic, advised her to perform from more appropriate/sturdy surface  Therapeutic Activity: Education/discussion re: progression of activities, monitoring of symptoms and modifying activities accordingly, relevant anatomy/physiology as pertains to symptom behavior and functional mobility   [facility] Adult PT Treatment:                                                DATE: 10/13/23 Therapeutic Exercise: Practice reps with STS, LAQ, marches BIL; HEP handout + education on appropriate performance, rationale for interventions    PATIENT EDUCATION:  Education details: rationale for interventions, HEP  Person educated: Patient Education method: Explanation, Demonstration, Tactile cues, Verbal cues Education comprehension: verbalized understanding, returned demonstration, verbal cues required, tactile cues required, and needs further education    HOME EXERCISE PROGRAM: Access Code: [email or web address] URL: [email or web address] Date: 10/21/2023 Prepared by: Fransisco Hertz  Exercises - Seated March  - 2-3 x daily -  1 sets - 8 reps - Seated Long Arc Quad  - 2-3 x daily - 1 sets - 8 reps - Seated Hip Adduction Isometrics with Ball  - 2-3 x daily - 1 sets - 8 reps - Sit to Stand with Armchair  - 2-3 x daily - 1 sets - 5 reps 10/27/23: - Pelvic tilt  - 1 x daily - 7 x weekly - 2 sets - 10 reps - 5 hold - Supine Bridge  - 1 x daily - 7 x weekly - 2 sets - 10 reps - 5 hold  ASSESSMENT:  CLINICAL IMPRESSION: 10/27/2023 Pt arrives w/ 8/10 pain, reports HEP adherence but notes no change in her pain. Pain remains mostly in hips and thighs while standing and walking. Pt did well with introduction of Nustep and supine basic back exercises. Her 30 sec STS has improved. Her HEP was updated with additional stabilization. Pt is motivated to improve. She reports feeling good at end of session.   Per eval - Patient is a pleasant 62 y.o. woman who was seen today for physical therapy evaluation and treatment for chronic low back pain worsening over past 5-6 months. She endorses increased pain with general activity but particularly with standing/walking in more upright posture. On exam she demonstrates good lumbar flexion but difficulty tolerating lumbar extension past neutral. Global LE weakness with MMT that is nonpainful, pt endorses chronic history of LE weakness since knee surgery several years ago. 30secSTS nonpainful per pt report but is indicative of reduced activity/exercise tolerance and does demonstrate altered kinematics with weight shift to R. Tolerates exam and HEP well without adverse event or increase in pain.  Recommend trial of skilled PT to address aforementioned deficits with aim of improving functional tolerance and reducing pain with typical activities. Pt departs today's session in no acute distress, all voiced concerns/questions addressed appropriately from PT perspective.    OBJECTIVE IMPAIRMENTS: Abnormal gait, decreased activity tolerance, decreased balance, decreased endurance, decreased mobility,  difficulty walking, decreased ROM, decreased strength, impaired perceived functional ability, improper body mechanics, postural dysfunction, and pain.   ACTIVITY LIMITATIONS: carrying, lifting, bending, standing, squatting, transfers, and locomotion level  PARTICIPATION LIMITATIONS: meal prep, cleaning, laundry, and community activity  PERSONAL FACTORS: Age, Time since onset of injury/illness/exacerbation, and 3+ comorbidities: Anxiety, asthma, fibromyalgia, headaches, HTN, RA, sleep apnea  are also affecting patient's functional  outcome.   REHAB POTENTIAL: Fair given chronicity and comorbidities  CLINICAL DECISION MAKING: Evolving/moderate complexity  EVALUATION COMPLEXITY: Moderate   GOALS: Goals reviewed with patient? Yes  SHORT TERM GOALS: Target date: 11/10/2023  Pt will demonstrate appropriate understanding and performance of initially prescribed HEP in order to facilitate improved independence with management of symptoms.  Baseline: HEP provided on eval Goal status: INITIAL   2. Pt will score greater than or equal to 36 on FOTO in order to demonstrate improved perception of function due to symptoms.  Baseline: 30  Goal status: INITIAL    LONG TERM GOALS: Target date: 12/08/2023   Pt will score 43 on FOTO in order to demonstrate improved perception of functional status due to symptoms.  Baseline: 30 Goal status: INITIAL  2.  Pt will demonstrate at least 50% normal lumbar extension AROM in order to demonstrate improved tolerance to functional movement patterns.   Baseline: pain past neutral Goal status: INITIAL  3.  Pt will demonstrate global LE MMT of at least 4/5 throughout in order to demonstrate improved strength for functional movements and reduce fall risk.  Baseline: see MMT chart above Goal status: INITIAL  4. Pt will be able to perform at least 10 repetitions during 30 second sit to stand test in order to demonstrate improved exercise/activity tolerance  (cutoff score for low exercise tolerance 18 repetitions in males and 16 repetitions in females per Georgina Snell al 2024)  Baseline: 7 repetitions, heavy UE support  Goal status: INITIAL    5. Pt will report at least 50% decrease in overall pain levels in past week in order to facilitate improved tolerance to basic ADLs/mobility.   Baseline: 7-10/10  Goal status: INITIAL    6. Pt will demonstrate appropriate performance of final prescribed HEP in order to facilitate improved self-management of symptoms post-discharge.   Baseline: initial HEP prescribed  Goal status: INITIAL    PLAN:  PT FREQUENCY: 1-2x/week  PT DURATION: 8 weeks  PLANNED INTERVENTIONS: 97164- PT Re-evaluation, 97110-Therapeutic exercises, 97530- Therapeutic activity, 97112- Neuromuscular re-education, 97535- Self Care, 54098- Manual therapy, [phone]- Gait training, [phone]- Aquatic Therapy, Patient/Family education, Balance training, Stair training, Taping, Dry Needling, Joint mobilization, Spinal mobilization, Cryotherapy, and Moist heat.  PLAN FOR NEXT SESSION: Review/update HEP PRN. Work on [employer] exercises as appropriate with emphasis on gradually improving extension tolerance, LE strengthening within pt tolerance. Symptom modification strategies as indicated/appropriate. Pt interested in utilizing nu step    Jannette Spanner, PTA 10/27/23 3:18 PM Phone: [phone] Fax: [phone]

## 2023-11-03 ENCOUNTER — Ambulatory Visit: Payer: 59 | Admitting: Physical Therapy

## 2023-11-03 NOTE — Therapy (Addendum)
OUTPATIENT PHYSICAL THERAPY TREATMENT NOTE + NO VISIT DISCHARGE SUMMARY (see below)    Patient Name: [name] MRN: [id number] DOB:[birth year], 62 y.o., female Today's Date: 11/04/2023  END OF SESSION:  PT End of Session - 11/04/23 1530     Visit Number 4    Number of Visits 17    Date for PT Re-Evaluation 12/08/23    Authorization Type [employer]    PT Start Time 1531    PT Stop Time 1612    PT Time Calculation (min) 41 min    Activity Tolerance Patient tolerated treatment well;No increased pain    Behavior During Therapy WFL for tasks assessed/performed               Past Medical History:  Diagnosis Date   Anxiety    Asthma    Back pain    Chronic pain    DDD (degenerative disc disease), lumbar    Degenerative disc disease    Degenerative disc disease, cervical    Degenerative disc disease, cervical    Fibroid    Fibromyalgia    Headache    regular   Heartburn    Hypertension    Joint pain    Localized swelling of both lower legs    Osteoarthritis    Other specified disorders of thyroid    Rheumatoid arthritis (HCC)    Sleep apnea    mild, patient does not use mask   SOB (shortness of breath)    Past Surgical History:  Procedure Laterality Date   BACK SURGERY     JOINT REPLACEMENT     NOVASURE ABLATION     TOTAL KNEE ARTHROPLASTY Left 01/17/2017   Procedure: LEFT TOTAL KNEE ARTHROPLASTY;  Surgeon: [name], MD;  Location: [facility];  Service: Orthopedics;  Laterality: Left;   TUBAL LIGATION     Patient Active Problem List   Diagnosis Date Noted   Visit for routine gyn exam 02/25/2022   Vitamin D deficiency 02/21/2021   Essential hypertension 02/21/2021   Pure hypercholesterolemia 02/21/2021   Class 3 severe obesity with serious comorbidity and body mass index (BMI) of 50.0 to 59.9 in adult [facility]) 06/13/2020   Prediabetes 06/13/2020   Leg pain 06/13/2020   Unilateral primary osteoarthritis, right knee 02/02/2020   Status post total  left knee replacement 01/17/2017   Unilateral primary osteoarthritis, left knee 12/05/2016   Degenerative disc disease, cervical     PCP: Fleet Contras, MD   REFERRING PROVIDER: [name], MD   REFERRING DIAG: [phone] (ICD-10-CM) - Low back pain with bilateral sciatica, unspecified back pain laterality, unspecified chronicity   Rationale for Evaluation and Treatment: Rehabilitation  THERAPY DIAG:  Other low back pain  Muscle weakness (generalized)  Other abnormalities of gait and mobility  ONSET DATE: >20 years, worsening 5-6 months ago  SUBJECTIVE:  Per eval - Pt endorses chronic history of low back pain, and mobility deficits + weakness since knee surgery several years ago. Pt states about 7-8 months ago she began trying to lose weight more aggressively, and 5-6 months ago began developing increase in her back pain. She reports chronic balance issues and has frequent falls, but denies any injuries with these. She describes symptoms as slowly worsening since onset but no changes since MRI in early October. She denies any numbness/tingling but does describe LE pain that is fairly symmetrical. States pain management attempted spinal cord stimulator but was unsuccessful. She describes pain with general activity that improves with rest - of note, she states she has received assistance from [facility] aide since her knee surgery and feels her surgical limb has been weaker since, tends to give out at times (chronic).  SUBJECTIVE STATEMENT: 11/04/2023 Pt states she has been having increased R knee pain. States her back continues to feel about the same overall but did get some transient relief after last session. Has done HEP twice since last visit.    PERTINENT HISTORY:  Anxiety, asthma,  fibromyalgia, headaches, HTN, RA, sleep apnea  PAIN:  Are you having pain: 8/10 back and R knee (mostly knee)  Per eval -  Location/description: tailbone, posterolateral hips, anterior thighs Best-worst over past week: 7-10/10  - aggravating factors: general activity, standing/walking, cooking, lower body dressing, housework/cleaning - Easing factors: sitting or lying down, medication  PRECAUTIONS: fall risk   WEIGHT BEARING RESTRICTIONS: No  FALLS:  Has patient fallen in last 6 months? Yes. Number of falls ~4, loses balance  LIVING ENVIRONMENT: Has a rollator and QC, uses both. Has shower chair and grab bars. Has a reacher to assist with lower body dressing Lives with son, has home health aide who assists with housework and dressing Home environment level, no stairs Drives  OCCUPATION: not working  PLOF: modified independent, has had HH aide since knee surgery (2016 or 2018 per pt report)  PATIENT GOALS: reduce pain, improve activity levels  NEXT MD VISIT: December  OBJECTIVE:  Note: Objective measures were completed at Evaluation unless otherwise noted.  DIAGNOSTIC FINDINGS:  Lumbar MRI 09/22/23  "IMPRESSION: 1. L2-3: Shallow protrusion of the disc with slight upward turning behind the inferior endplate of L2. Bilateral facet degeneration with facet and ligamentous hypertrophy. Mild multifactorial stenosis at this level. Definite focal neural compression is not demonstrated however. 2. L3-4: Bulging of the disc slightly more prominent towards the right. Mild facet and ligamentous hypertrophy. No compressive stenosis. 3. L4-5: Bilateral facet osteoarthritis with 2 mm of degenerative anterolisthesis. Minimal bulging of the disc. No compressive stenosis. Facet arthritis could be painful. 4. L5-S1: Mild bulging of the disc. Mild bilateral facet osteoarthritis. No compressive stenosis or neural compression."  PATIENT SURVEYS:  FOTO 30 current, 43 predicted    SCREENING FOR RED FLAGS: Red flag screening reassuring overall, MRI as above - bowel/bladder changes: denies - bucking/overt weakness: reports chronic LLE weakness/buckling since knee surgery, no recent changes - bilateral LE numbness: denies - unintentional weight loss: denies (has been intentional)    COGNITION: Overall cognitive status: Within functional limits for tasks assessed     SENSATION: Light touch intact although slightly lessened R lateral knee No clonus either LE  No hofman or tromner sign either UE    POSTURE: forward flexed posture, rounded shoulders, forward head  PALPATION: deferred  LUMBAR ROM:   AROM eval  Flexion Mid shin without pain  Extension Pain past neutral  Right lateral flexion   Left lateral flexion   Right rotation 75% s  Left rotation 50% s   (Blank rows = not tested) (Key: WFL = within functional limits not formally assessed, * = concordant pain, s = stiffness/stretching sensation, NT = not tested)   LOWER EXTREMITY ROM:     Active  Right eval Left eval  Hip flexion    Hip extension    Hip internal rotation    Hip external rotation    Knee extension    Knee flexion    (Blank rows = not tested) (Key: WFL = within functional limits not formally assessed, * = concordant pain, s = stiffness/stretching sensation, NT = not tested)  Comments:    LOWER EXTREMITY MMT:    MMT Right eval Left eval  Hip flexion 3- 3-  Hip abduction (modified sitting) 4 4  Hip internal rotation    Hip external rotation    Knee flexion 4 4  Knee extension 4- 3+  Ankle dorsiflexion     (Blank rows = not tested) (Key: WFL = within functional limits not formally assessed, * = concordant pain, s = stiffness/stretching sensation, NT = not tested)  Comments:    LUMBAR SPECIAL TESTS:  deferred  FUNCTIONAL TESTS:  30secSTS: 7 repetitions heavy UE support and noted lean to R  10/27/23: 9 reps, without UE support, equal WB  GAIT: Distance walked:  within clinic Assistive device utilized: Quad cane large base Level of assistance: Modified independence Comments: significant antalgic gait, reduced gait speed/cadence, increased trunk sway, intermittent steadying on nearby objects  TODAY'S TREATMENT:                                                                                                                              [facility] Adult PT Treatment:                                                DATE: 11/04/23 Therapeutic Exercise: Mini bridge 2x8 cues for breath control Hooklying march 2x8 BIL SKTC 12x3sec hold with sheet assist LTR x8 BIL emphasis on breath control Single leg seated abduction red band 2x8 BIL cues for form and pacing Red band ER 2x8 with ball as fulcrum Red band IR x10 ball as fulcrum STS 2x8 from mat HEP update + education/handout   [facility] Adult PT Treatment:                                                DATE: 10/27/23 Therapeutic Exercise: Nustep L4 UE/LE x 5 minutes  PPT 5 sec 2 x 10  Supine March with PPT 10 x 2  LTR x 10 PPT to Small  Bridge x 10 SKTC with sheet 10 sec x 3 each  STS in 30 sec =9 without UE , standard at table      [facility] Adult PT Treatment:                                                DATE: 10/21/23 Therapeutic Exercise: LAQ 2x8 cues for foot positioning and pacing Seated march 2x8 BIL STS from rollator x5 with UE support STS from raised mat no UE support 2x5 Seated adductor iso 2x10 Red band paloff 3x8 BIL  HEP update + education/handout; discussed safe performance at home as she states she has been doing STS from rollator at times and one of the brakes is broken, observed in clinic, advised her to perform from more appropriate/sturdy surface  Therapeutic Activity: Education/discussion re: progression of activities, monitoring of symptoms and modifying activities accordingly, relevant anatomy/physiology as pertains to symptom behavior and functional mobility    PATIENT EDUCATION:   Education details: rationale for interventions, HEP  Person educated: Patient Education method: Explanation, Demonstration, Tactile cues, Verbal cues Education comprehension: verbalized understanding, returned demonstration, verbal cues required, tactile cues required, and needs further education    HOME EXERCISE PROGRAM: Access Code: [email or web address] URL: [email or web address] Date: 11/04/2023 Prepared by: Fransisco Hertz  Exercises - Seated Long Arc Quad  - 2-3 x daily - 1 sets - 8 reps - Seated Hip Adduction Isometrics with Ball  - 2-3 x daily - 1 sets - 8 reps - Sit to Stand with Armchair  - 2-3 x daily - 1 sets - 8 reps - Supine Bridge  - 2-3 x daily - 1 sets - 10 reps - 5 hold - Supine March  - 2-3 x daily - 1 sets - 5 reps - Supine Lower Trunk Rotation  - 2-3 x daily - 1 sets - 8 reps  ASSESSMENT:  CLINICAL IMPRESSION: 11/04/2023 Pt arrives w/ pain around baseline, reports mild relief after last session but transient. Continues to endorse about the same amount of pain overall. Today continuing to progress lumbar/hip mobility and stability exercises as able, pt denies any improvement or worsening of pain with activity. Given lack of change in symptoms, recommend trial of more standing activities in upcoming sessions to assess pt tolerance and symptom behavior as she does well tolerating supine/seated work but this does not seem to change her pain much thus far. No adverse events, pt reports mild improvement in symptoms on departure. Pt departs today's session in no acute distress, all voiced questions/concerns addressed appropriately from PT perspective.      Per eval - Patient is a pleasant 62 y.o. woman who was seen today for physical therapy evaluation and treatment for chronic low back pain worsening over past 5-6 months. She endorses increased pain with general activity but particularly with standing/walking in more upright posture. On exam she demonstrates good lumbar  flexion but difficulty tolerating lumbar extension past neutral. Global LE weakness with MMT that is nonpainful, pt endorses chronic history of LE weakness since knee surgery several years ago. 30secSTS nonpainful per pt report but is indicative of reduced activity/exercise tolerance and does demonstrate altered kinematics with weight shift to R. Tolerates exam and HEP well without adverse event or increase in pain.  Recommend trial of skilled PT to address aforementioned deficits with aim of improving functional tolerance and reducing pain  with typical activities. Pt departs today's session in no acute distress, all voiced concerns/questions addressed appropriately from PT perspective.    OBJECTIVE IMPAIRMENTS: Abnormal gait, decreased activity tolerance, decreased balance, decreased endurance, decreased mobility, difficulty walking, decreased ROM, decreased strength, impaired perceived functional ability, improper body mechanics, postural dysfunction, and pain.   ACTIVITY LIMITATIONS: carrying, lifting, bending, standing, squatting, transfers, and locomotion level  PARTICIPATION LIMITATIONS: meal prep, cleaning, laundry, and community activity  PERSONAL FACTORS: Age, Time since onset of injury/illness/exacerbation, and 3+ comorbidities: Anxiety, asthma, fibromyalgia, headaches, HTN, RA, sleep apnea  are also affecting patient's functional outcome.   REHAB POTENTIAL: Fair given chronicity and comorbidities  CLINICAL DECISION MAKING: Evolving/moderate complexity  EVALUATION COMPLEXITY: Moderate   GOALS: Goals reviewed with patient? Yes  SHORT TERM GOALS: Target date: 11/10/2023  Pt will demonstrate appropriate understanding and performance of initially prescribed HEP in order to facilitate improved independence with management of symptoms.  Baseline: HEP provided on eval Goal status: INITIAL   2. Pt will score greater than or equal to 36 on FOTO in order to demonstrate improved perception  of function due to symptoms.  Baseline: 30  Goal status: INITIAL    LONG TERM GOALS: Target date: 12/08/2023   Pt will score 43 on FOTO in order to demonstrate improved perception of functional status due to symptoms.  Baseline: 30 Goal status: INITIAL  2.  Pt will demonstrate at least 50% normal lumbar extension AROM in order to demonstrate improved tolerance to functional movement patterns.   Baseline: pain past neutral Goal status: INITIAL  3.  Pt will demonstrate global LE MMT of at least 4/5 throughout in order to demonstrate improved strength for functional movements and reduce fall risk.  Baseline: see MMT chart above Goal status: INITIAL  4. Pt will be able to perform at least 10 repetitions during 30 second sit to stand test in order to demonstrate improved exercise/activity tolerance (cutoff score for low exercise tolerance 18 repetitions in males and 16 repetitions in females per Georgina Snell al 2024)  Baseline: 7 repetitions, heavy UE support  Goal status: INITIAL    5. Pt will report at least 50% decrease in overall pain levels in past week in order to facilitate improved tolerance to basic ADLs/mobility.   Baseline: 7-10/10  Goal status: INITIAL    6. Pt will demonstrate appropriate performance of final prescribed HEP in order to facilitate improved self-management of symptoms post-discharge.   Baseline: initial HEP prescribed  Goal status: INITIAL    PLAN:  PT FREQUENCY: 1-2x/week  PT DURATION: 8 weeks  PLANNED INTERVENTIONS: 97164- PT Re-evaluation, 97110-Therapeutic exercises, 97530- Therapeutic activity, 97112- Neuromuscular re-education, 97535- Self Care, 16109- Manual therapy, [phone]- Gait training, [phone]- Aquatic Therapy, Patient/Family education, Balance training, Stair training, Taping, Dry Needling, Joint mobilization, Spinal mobilization, Cryotherapy, and Moist heat.  PLAN FOR NEXT SESSION: Review/update HEP PRN. Pt enjoys warming up on nu step, plan to  trial more standing activities within pt tolerance.    Ashley Murrain PT, DPT 11/04/2023 5:14 PM     Discharge addendum 12/11/2023  PHYSICAL THERAPY DISCHARGE SUMMARY  Visits from Start of Care: 4  Current functional level related to goals / functional outcomes: Unable to be assessed   Remaining deficits: Unable to be assessed   Education / Equipment: Unable to be assessed  Patient goals were unable to be assessed. Patient is being discharged due to not returning since the last visit.  Ashley Murrain PT, DPT 12/11/2023 2:37 PM

## 2023-11-04 ENCOUNTER — Ambulatory Visit: Payer: 59 | Admitting: Physical Therapy

## 2023-11-04 ENCOUNTER — Encounter: Payer: Self-pay | Admitting: Physical Therapy

## 2023-11-04 DIAGNOSIS — R2689 Other abnormalities of gait and mobility: Secondary | ICD-10-CM

## 2023-11-04 DIAGNOSIS — M5459 Other low back pain: Secondary | ICD-10-CM

## 2023-11-04 DIAGNOSIS — M6281 Muscle weakness (generalized): Secondary | ICD-10-CM

## 2023-11-09 ENCOUNTER — Encounter (HOSPITAL_COMMUNITY): Payer: Self-pay | Admitting: Emergency Medicine

## 2023-11-09 ENCOUNTER — Emergency Department (HOSPITAL_COMMUNITY): Payer: 59

## 2023-11-09 ENCOUNTER — Emergency Department (HOSPITAL_COMMUNITY)
Admission: EM | Admit: 2023-11-09 | Discharge: 2023-11-09 | Disposition: A | Discharge status: Home / Self Care | Payer: 59 | Attending: Emergency Medicine | Admitting: Emergency Medicine

## 2023-11-09 DIAGNOSIS — D72829 Elevated white blood cell count, unspecified: Secondary | ICD-10-CM | POA: Diagnosis not present

## 2023-11-09 DIAGNOSIS — R42 Dizziness and giddiness: Secondary | ICD-10-CM | POA: Diagnosis present

## 2023-11-09 LAB — CBC WITH DIFFERENTIAL/PLATELET
Abs Immature Granulocytes: 0.07 10*3/uL (ref 0.00–0.07)
Basophils Absolute: 0 10*3/uL (ref 0.0–0.1)
Basophils Relative: 1 %
Eosinophils Absolute: 0.1 10*3/uL (ref 0.0–0.5)
Eosinophils Relative: 2 %
HCT: 39.6 % (ref 36.0–46.0)
Hemoglobin: 13.2 g/dL (ref 12.0–15.0)
Immature Granulocytes: 1 %
Lymphocytes Relative: 34 %
Lymphs Abs: 2 10*3/uL (ref 0.7–4.0)
MCH: 31 pg (ref 26.0–34.0)
MCHC: 33.3 g/dL (ref 30.0–36.0)
MCV: 93 fL (ref 80.0–100.0)
Monocytes Absolute: 0.5 10*3/uL (ref 0.1–1.0)
Monocytes Relative: 8 %
Neutro Abs: 3.2 10*3/uL (ref 1.7–7.7)
Neutrophils Relative %: 54 %
Platelets: 353 10*3/uL (ref 150–400)
RBC: 4.26 MIL/uL (ref 3.87–5.11)
RDW: 12.9 % (ref 11.5–15.5)
WBC: 5.8 10*3/uL (ref 4.0–10.5)
nRBC: 0 % (ref 0.0–0.2)

## 2023-11-09 LAB — URINALYSIS, ROUTINE W REFLEX MICROSCOPIC
Bilirubin Urine: NEGATIVE
Glucose, UA: NEGATIVE mg/dL
Hgb urine dipstick: NEGATIVE
Ketones, ur: NEGATIVE mg/dL
Nitrite: NEGATIVE
Protein, ur: NEGATIVE mg/dL
Specific Gravity, Urine: 1.006 (ref 1.005–1.030)
pH: 6 (ref 5.0–8.0)

## 2023-11-09 LAB — COMPREHENSIVE METABOLIC PANEL
ALT: 18 U/L (ref 0–44)
AST: 24 U/L (ref 15–41)
Albumin: 3.9 g/dL (ref 3.5–5.0)
Alkaline Phosphatase: 67 U/L (ref 38–126)
Anion gap: 9 (ref 5–15)
BUN: 15 mg/dL (ref 8–23)
CO2: 25 mmol/L (ref 22–32)
Calcium: 9.2 mg/dL (ref 8.9–10.3)
Chloride: 100 mmol/L (ref 98–111)
Creatinine, Ser: 0.81 mg/dL (ref 0.44–1.00)
GFR, Estimated: >60 mL/min (ref >60)
Glucose, Bld: 96 mg/dL (ref 70–99)
Potassium: 3.2 mmol/L — ABNORMAL LOW (ref 3.5–5.1)
Sodium: 134 mmol/L — ABNORMAL LOW (ref 135–145)
Total Bilirubin: 0.8 mg/dL (ref <1.2)
Total Protein: 7.1 g/dL (ref 6.5–8.1)

## 2023-11-09 MED ORDER — ONDANSETRON HCL 4 MG/2ML IJ SOLN
4.0000 mg | Freq: Once | INTRAMUSCULAR | Status: DC
  Filled 2023-11-09: qty 2

## 2023-11-09 MED ORDER — MECLIZINE HCL 25 MG PO TABS: 25.0000 mg | ORAL_TABLET | Freq: Three times a day (TID) | ORAL | 0 refills | Status: AC | PRN

## 2023-11-09 MED ORDER — LORAZEPAM 2 MG/ML IJ SOLN
1.0000 mg | INTRAMUSCULAR | Status: DC | PRN
  Administered 2023-11-09: 1 mg via INTRAVENOUS
  Filled 2023-11-09: qty 1

## 2023-11-09 MED ORDER — MECLIZINE HCL 25 MG PO TABS
25.0000 mg | ORAL_TABLET | Freq: Once | ORAL | Status: AC
Start: 2023-11-09 — End: 2023-11-09
  Administered 2023-11-09: 25 mg via ORAL
  Filled 2023-11-09: qty 1

## 2023-11-09 MED ORDER — POTASSIUM CHLORIDE CRYS ER 20 MEQ PO TBCR
40.0000 meq | EXTENDED_RELEASE_TABLET | Freq: Once | ORAL | Status: AC
  Administered 2023-11-09: 40 meq via ORAL
  Filled 2023-11-09: qty 2

## 2023-11-09 MED ORDER — SODIUM CHLORIDE 0.9 % IV BOLUS
500.0000 mL | Freq: Once | INTRAVENOUS | Status: AC
  Administered 2023-11-09: 500 mL via INTRAVENOUS

## 2023-11-09 NOTE — ED Provider Notes (Addendum)
[facility] AT [facility] Provider Note   CSN: [id number] Arrival date & time: 11/09/23  0249     History  Chief Complaint  Patient presents with   Dizziness    [name] is a 62 y.o. female.  Patient is in the ED today complaining of a "wooziness" for the last 4 hours.  She states that she has felt this way before when she was dehydrated.  Denies blurry vision, vertigo.  Acknowledge that she has not drink a lot of water today.  Also endorses a change in appetite where she has been eating less for the last week.  She thinks that it is her Ozempic that is causing her to eat less.  She states that she has lost 60 to 70 pounds within the last year on Ozempic.  Denies fever, chest pain, shortness of breath, numbness, tingling, abdominal pain, dysuria, hematuria, weakness, fatigue, neurological deficit.   The history is provided by the patient.  Dizziness      Home Medications Prior to Admission medications   Medication Sig Start Date End Date Taking? Authorizing Provider  albuterol (PROVENTIL HFA;VENTOLIN HFA) 108 (90 BASE) MCG/ACT inhaler Inhale 2 puffs into the lungs every 6 (six) hours as needed for shortness of breath.     [provider]  albuterol (PROVENTIL) (2.5 MG/3ML) 0.083% nebulizer solution Take 2.5 mg by nebulization every 6 (six) hours as needed for wheezing or shortness of breath.    [provider]  atenolol (TENORMIN) 50 MG tablet Take 50 mg by mouth daily. 03/14/21   [provider]  cetirizine (ZYRTEC) 10 MG tablet Take 10 mg by mouth daily as needed for allergies.    [provider]  Cholecalciferol (VITAMIN D3) 125 MCG (5000 UT) CAPS Take 1 capsule (5,000 Units total) by mouth daily. 04/23/21   Corinna Capra A, DO  cyclobenzaprine (FLEXERIL) 10 MG tablet Take 10 mg by mouth 3 (three) times daily as needed for muscle spasms.    [provider]  diazepam (VALIUM) 5 MG tablet Take one tab  by mouth one hour prior to MRI, repeat as needed 09/12/23   Kirtland Bouchard, PA-C  escitalopram (LEXAPRO) 10 MG tablet  07/20/20   [provider]  FLOVENT HFA 220 MCG/ACT inhaler USE 2 PUFFS TWICE A DAY 03/10/17   [provider]  furosemide (LASIX) 20 MG tablet Take 20 mg by mouth daily as needed for fluid or edema.    [provider]  ibuprofen (ADVIL,MOTRIN) 800 MG tablet Take 800 mg by mouth 3 (three) times daily as needed for moderate pain.  08/28/15   [provider]  Insulin Pen Needle (BD PEN NEEDLE NANO 2ND GEN) 32G X 4 MM MISC 1 Package by Does not apply route 2 (two) times daily. 01/09/21   Alois Cliche, PA-C  losartan-hydrochlorothiazide (HYZAAR) 100-25 MG tablet Take 1 tablet by mouth daily. 12/28/21   [provider]  NOREL AD 4-10-325 MG TABS Take 1 tablet by mouth 2 (two) times daily as needed. 09/01/20   [provider]  Oxycodone HCl 20 MG TABS TAKE 1 TABLET BY MOUTH EVERY 4-6 HOURS (MAX OF 5 PER DAY) 03/10/17   [provider]  OZEMPIC, 0.25 OR 0.5 MG/DOSE, 2 MG/1.5ML SOPN INJECT 0.25MG  INTO THE SKIN ONE TIME PER WEEK 04/30/21   Corinna Capra A, DO  potassium chloride SA (KLOR-CON) 20 MEQ tablet Take 2 tablets (40 mEq total) by mouth daily for 7  days. 11/12/21 11/19/21  Mesner, Barbara Cower, MD  Semaglutide, 2 MG/DOSE, (OZEMPIC, 2 MG/DOSE,) 8 MG/3ML SOPN Inject 2 mg into the skin once a week. 11/19/22         Allergies    Patient has no known allergies.    Review of Systems   Review of Systems  Neurological:  Positive for dizziness.    Physical Exam Updated Vital Signs BP (!) 145/90   Pulse 77   Temp 97.8 F (36.6 C) (Oral)   Resp 16   Ht 5\' 7"  (1.702 m)   Wt 65 kg   LMP 04/20/2011   SpO2 98%   BMI 22.44 kg/m  Physical Exam Vitals and nursing note reviewed.  Constitutional:      General: She is not in acute distress.    Appearance: Normal appearance. She is not ill-appearing, toxic-appearing or diaphoretic.   HENT:     Head: Normocephalic and atraumatic.  Eyes:     General:        Right eye: No discharge.        Left eye: No discharge.     Extraocular Movements: Extraocular movements intact.     Conjunctiva/sclera: Conjunctivae normal.     Pupils: Pupils are equal, round, and reactive to light.  Cardiovascular:     Rate and Rhythm: Normal rate and regular rhythm.     Pulses: Normal pulses.     Heart sounds: Normal heart sounds.  Pulmonary:     Effort: Pulmonary effort is normal.     Breath sounds: Normal breath sounds.  Abdominal:     General: Abdomen is flat.     Palpations: Abdomen is soft.  Musculoskeletal:        General: No swelling or tenderness.     Cervical back: Normal range of motion.     Right lower leg: No edema.     Left lower leg: No edema.  Skin:    General: Skin is warm and dry.     Coloration: Skin is not jaundiced or pale.     Findings: No erythema or rash.  Neurological:     General: No focal deficit present.     Mental Status: She is alert and oriented to person, place, and time. Mental status is at baseline.     Cranial Nerves: No cranial nerve deficit.     Sensory: No sensory deficit.     Motor: No weakness.     Coordination: Coordination normal.     Gait: Gait normal.  Psychiatric:        Mood and Affect: Mood normal.    Orthostatic blood pressures:  lying BP: 141/91, HR: 72  sitting BP: 139/100, HR: 78  standing BP: 145/90, HR: 90   ED Results / Procedures / Treatments   Labs (all labs ordered are listed, but only abnormal results are displayed) Labs Reviewed  COMPREHENSIVE METABOLIC PANEL - Abnormal; Notable for the following components:      Result Value   Sodium 134 (*)    Potassium 3.2 (*)    All other components within normal limits  URINALYSIS, ROUTINE W REFLEX MICROSCOPIC - Abnormal; Notable for the following components:   Leukocytes,Ua MODERATE (*)    Bacteria, UA RARE (*)    All other components within normal limits  CBC WITH  DIFFERENTIAL/PLATELET    EKG None  Radiology No results found.  Procedures Procedures    Medications Ordered in ED Medications  ondansetron (ZOFRAN) injection 4 mg (4 mg Intravenous Patient Refused/Not  Given 11/09/23 0614)  potassium chloride SA (KLOR-CON M) CR tablet 40 mEq (40 mEq Oral Given 11/09/23 0547)  meclizine (ANTIVERT) tablet 25 mg (25 mg Oral Given 11/09/23 0547)  sodium chloride 0.9 % bolus 500 mL (500 mLs Intravenous New Bag/Given 11/09/23 1610)    ED Course/ Medical Decision Making/ A&P Clinical Course as of 11/09/23 0701  Sun Nov 09, 2023  0508 BP: 135/85 [CB]    Clinical Course User Index [CB] Lunette Stands, PA-C                                Medical Decision Making Amount and/or Complexity of Data Reviewed Labs: ordered. Radiology: ordered.  Risk Prescription drug management.    Patient presents to the ED for concern of "wooziness", this involves an extensive number of treatment options, and is a complaint that carries with it a high risk of complications and morbidity.  The differential diagnosis includes BPPV, intracranial mass, drug-induced vertigo, dehydration, CVA   Additional history obtained:  Additional history obtained from  EMS, Nursing, Outside Medical Records, and Past Admission    Lab Tests:  I Ordered, and personally interpreted labs.  The pertinent results include:   UA shows Moderate leukocytes and rare bacteria    Imaging Studies ordered:  I ordered imaging studies including CT pending with follow-up MRI if negative.   Cardiac Monitoring:  Initial EKG: NSR   Medicines ordered and prescription drug management:  I ordered medication including meclizine for dizziness and Zofran for nausea Reevaluation of the patient after these medicines showed that the patient improved I have reviewed the patients home medicines and have made adjustments as needed   Test Considered:  MRI     Problem List / ED  Course:  "Wooziness" --patient is a 62 year old female who presents to the ER today complaining of "wooziness".  She said that she was out playing in the game room when she decided she wanted to go to bed.  She then laid down where she began feeling woozy and was worse when she stood up to go use the bathroom.  She says that it is not vertigo, blurry vision, near syncope.  But that it is simply feeling lightheaded.  She says she is out of the way similarly when she was dehydrated.  She says that she is not drinking as much today and is currently on Ozempic which she has been on for the last 2 years.  She has lost what she says is close to 60 to 70 pounds.  Physical exam was entirely benign.  No exam and plan of exam was normal.    She drank water and started to feel better.  Potassium came back slightly decreased and 40 mill equivalents potassium was given. however then when doing orthostatic blood pressures began to feel nauseous and had 1 episode of vomiting.  Zofran was then given for nausea and meclizine was then given for dizziness.  CT scan of head was then ordered.  Results are still pending.  If negative MRI was then going to be pursued.  Patient care was then transferred at this time.   Reevaluation:  After the interventions noted above, I reevaluated the patient and found that they have :stayed the same    Dispostion: 7:01 AM Care of @PATIENTNAME @ transferred to [facility] at the end of my shift as the patient will require reassessment once labs/imaging have resulted. Patient presentation, ED  course, and plan of care discussed with review of all pertinent labs and imaging. Please see his/her note for further details regarding further ED course and disposition. Plan at time of handoff is MRI if CT is negative for further evaluation. This may be altered or completely changed at the discretion of the oncoming team pending results of further workup.    Final Clinical Impression(s) / ED  Diagnoses Final diagnoses:  None    Rx / DC Orders ED Discharge Orders     None         Lunette Stands, PA-C 11/09/23 6213    Lunette Stands, PA-C 11/09/23 0865    Tilden Fossa, MD 11/09/23 [phone]

## 2023-11-09 NOTE — Discharge Instructions (Signed)
CT scan and MRI did not show any concerning findings.  Your potassium was slightly low.  You were given supplement in the emergency department.  For any concerning symptoms return to the emergency room.  Otherwise follow-up with your primary care provider.  Meclizine sent into your pharmacy.  Take this as needed for episodes of dizziness.

## 2023-11-09 NOTE — ED Triage Notes (Signed)
[facility] from home for dizziness that started at approx 0200. Denies syncopal episode, falls. Dizziness worsens with ambulation and exertion. Patient reports decreased appetite x past couple of days.

## 2023-11-09 NOTE — ED Notes (Addendum)
5:55 AM  RN attempted to get orthostatic vital signs on patient. Patient reports nausea and actively vomiting. Baldo Ash, PA-C notified. Request for nausea medication. Awaiting orders.   6:13 AM  Patient declines zofran stating, "I think I just got naseauted from the medicine. I really don't feel that nauseous."  6:29 AM  Patient transported to CT.

## 2023-11-09 NOTE — ED Provider Notes (Signed)
Signout received on this 62 year old female who came in for complaint of dizziness. CT pending.  Plan to order MRI. Physical Exam  BP (!) 145/90   Pulse 77   Temp 97.8 F (36.6 C) (Oral)   Resp 16   Ht 5\' 7"  (1.702 m)   Wt 65 kg   LMP 04/20/2011   SpO2 98%   BMI 22.44 kg/m     Procedures  Procedures  ED Course / MDM   Clinical Course as of 11/09/23 0752  Sun Nov 09, 2023  0508 BP: 135/85 [CB]    Clinical Course User Index [CB] Lunette Stands, PA-C   Medical Decision Making Amount and/or Complexity of Data Reviewed Labs: ordered. Radiology: ordered.  Risk Prescription drug management.   MRI and CT without acute finding.  She remained stable.  Discharged in stable condition.  Meclizine prescribed.  She will follow-up with PCP.       Marita Kansas, PA-C 11/09/23 1144    Arby Barrette, MD 11/12/23 2236

## 2023-11-10 ENCOUNTER — Ambulatory Visit: Payer: 59 | Admitting: Physical Therapy

## 2023-11-17 ENCOUNTER — Ambulatory Visit: Payer: 59 | Attending: Orthopaedic Surgery | Admitting: Physical Therapy

## 2023-11-17 ENCOUNTER — Ambulatory Visit (INDEPENDENT_AMBULATORY_CARE_PROVIDER_SITE_OTHER): Payer: 59 | Admitting: Orthopaedic Surgery

## 2023-11-17 ENCOUNTER — Telehealth: Payer: Self-pay | Admitting: Physical Therapy

## 2023-11-17 ENCOUNTER — Encounter: Payer: Self-pay | Admitting: Orthopaedic Surgery

## 2023-11-17 DIAGNOSIS — M5441 Lumbago with sciatica, right side: Secondary | ICD-10-CM

## 2023-11-17 DIAGNOSIS — M5442 Lumbago with sciatica, left side: Secondary | ICD-10-CM | POA: Diagnosis not present

## 2023-11-17 DIAGNOSIS — M25561 Pain in right knee: Secondary | ICD-10-CM | POA: Diagnosis not present

## 2023-11-17 DIAGNOSIS — G8929 Other chronic pain: Secondary | ICD-10-CM | POA: Diagnosis not present

## 2023-11-17 MED ORDER — METHYLPREDNISOLONE ACETATE 40 MG/ML IJ SUSP
40.0000 mg | INTRAMUSCULAR | Status: AC | PRN
  Administered 2023-11-17: 40 mg via INTRA_ARTICULAR

## 2023-11-17 MED ORDER — LIDOCAINE HCL 1 % IJ SOLN
3.0000 mL | INTRAMUSCULAR | Status: AC | PRN
  Administered 2023-11-17: 3 mL

## 2023-11-17 NOTE — Progress Notes (Signed)
The patient is well-known to [name].  She has had a history of a left knee replacement.  She is also someone is morbidly obese and been on a weight loss journey.  She is requesting a steroid injection in her right knee today.  It has been a while since we have injected her right knee.  We did obtain an MRI of her lumbar spine showing degenerative changes at multiple levels.  She is in pain management and they have tried a spinal cord stimulator before but could not get that in.  She has been in physical therapy but that is not really helping her.  She is a borderline diabetic.  She is continuing to lose weight is much as she can.  Examination of her right knee she has slight varus malalignment and global tenderness about the arc of motion of the knee.  There is patellofemoral crepitation.  At this point I did agree with trying a steroid injection today in her right knee.  I would like to see her back in 3 months at that visit we do need a standing AP and lateral of her right knee because it has been a very long time since we have x-rayed the right knee.     Procedure Note  Patient: [name]             Date of Birth: 15-Feb-1961           MRN: [id number]             Visit Date: 11/17/2023  Procedures: Visit Diagnoses:  1. Low back pain with bilateral sciatica, unspecified back pain laterality, unspecified chronicity   2. Chronic pain of right knee     Large Joint Inj: R knee on 11/17/2023 2:32 PM Indications: diagnostic evaluation and pain Details: 22 G 1.5 in needle, superolateral approach  Arthrogram: No  Medications: 3 mL lidocaine 1 %; 40 mg methylPREDNISolone acetate 40 MG/ML Outcome: tolerated well, no immediate complications Procedure, treatment alternatives, risks and benefits explained, specific risks discussed. Consent was given by the patient. Immediately prior to procedure a time out was called to verify the correct patient, procedure, equipment, support staff and site/side  marked as required. Patient was prepped and draped in the usual sterile fashion.

## 2023-11-17 NOTE — Telephone Encounter (Signed)
Called pt re: this morning's missed appt - left voicemail with clinic call back number, confirmed date/time of next appt. Advised on attendance policy with first missed appt. Encouraged to reach out with any questions/concerns

## 2023-11-17 NOTE — Therapy (Incomplete)
OUTPATIENT PHYSICAL THERAPY TREATMENT NOTE   Patient Name: [name] MRN: [id number] DOB:[birth year], 62 y.o., female Today's Date: 11/17/2023  END OF SESSION:      Past Medical History:  Diagnosis Date   Anxiety    Asthma    Back pain    Chronic pain    DDD (degenerative disc disease), lumbar    Degenerative disc disease    Degenerative disc disease, cervical    Degenerative disc disease, cervical    Fibroid    Fibromyalgia    Headache    regular   Heartburn    Hypertension    Joint pain    Localized swelling of both lower legs    Osteoarthritis    Other specified disorders of thyroid    Rheumatoid arthritis (HCC)    Sleep apnea    mild, patient does not use mask   SOB (shortness of breath)    Past Surgical History:  Procedure Laterality Date   BACK SURGERY     JOINT REPLACEMENT     NOVASURE ABLATION     TOTAL KNEE ARTHROPLASTY Left 01/17/2017   Procedure: LEFT TOTAL KNEE ARTHROPLASTY;  Surgeon: [name], MD;  Location: [facility];  Service: Orthopedics;  Laterality: Left;   TUBAL LIGATION     Patient Active Problem List   Diagnosis Date Noted   Visit for routine gyn exam 02/25/2022   Vitamin D deficiency 02/21/2021   Essential hypertension 02/21/2021   Pure hypercholesterolemia 02/21/2021   Class 3 severe obesity with serious comorbidity and body mass index (BMI) of 50.0 to 59.9 in adult (HCC) 06/13/2020   Prediabetes 06/13/2020   Leg pain 06/13/2020   Unilateral primary osteoarthritis, right knee 02/02/2020   Status post total left knee replacement 01/17/2017   Unilateral primary osteoarthritis, left knee 12/05/2016   Degenerative disc disease, cervical     PCP: Fleet Contras, MD   REFERRING PROVIDER: [name], MD   REFERRING DIAG: [phone] (ICD-10-CM) - Low back pain with bilateral sciatica, unspecified back pain laterality, unspecified chronicity   Rationale for Evaluation and Treatment:  Rehabilitation  THERAPY DIAG:  No diagnosis found.  ONSET DATE: >20 years, worsening 5-6 months ago  SUBJECTIVE:                                                                                                                                                                                          Per eval - Pt endorses chronic history of low back pain, and mobility deficits + weakness since knee surgery several years ago. Pt states about 7-8 months ago she began trying to lose weight more aggressively,  and 5-6 months ago began developing increase in her back pain. She reports chronic balance issues and has frequent falls, but denies any injuries with these. She describes symptoms as slowly worsening since onset but no changes since MRI in early October. She denies any numbness/tingling but does describe LE pain that is fairly symmetrical. States pain management attempted spinal cord stimulator but was unsuccessful. She describes pain with general activity that improves with rest - of note, she states she has received assistance from [facility] aide since her knee surgery and feels her surgical limb has been weaker since, tends to give out at times (chronic).  SUBJECTIVE STATEMENT: 11/17/2023 ***  *** Pt states she has been having increased R knee pain. States her back continues to feel about the same overall but did get some transient relief after last session. Has done HEP twice since last visit.    PERTINENT HISTORY:  Anxiety, asthma, fibromyalgia, headaches, HTN, RA, sleep apnea  PAIN:  Are you having pain: 8/10 back and R knee (mostly knee) ***   Per eval -  Location/description: tailbone, posterolateral hips, anterior thighs Best-worst over past week: 7-10/10  - aggravating factors: general activity, standing/walking, cooking, lower body dressing, housework/cleaning - Easing factors: sitting or lying down, medication  PRECAUTIONS: fall risk   WEIGHT BEARING RESTRICTIONS: No  FALLS:  Has  patient fallen in last 6 months? Yes. Number of falls ~4, loses balance  LIVING ENVIRONMENT: Has a rollator and QC, uses both. Has shower chair and grab bars. Has a reacher to assist with lower body dressing Lives with son, has home health aide who assists with housework and dressing Home environment level, no stairs Drives  OCCUPATION: not working  PLOF: modified independent, has had HH aide since knee surgery (2016 or 2018 per pt report)  PATIENT GOALS: reduce pain, improve activity levels  NEXT MD VISIT: December  OBJECTIVE:  Note: Objective measures were completed at Evaluation unless otherwise noted.  DIAGNOSTIC FINDINGS:  Lumbar MRI 09/22/23  "IMPRESSION: 1. L2-3: Shallow protrusion of the disc with slight upward turning behind the inferior endplate of L2. Bilateral facet degeneration with facet and ligamentous hypertrophy. Mild multifactorial stenosis at this level. Definite focal neural compression is not demonstrated however. 2. L3-4: Bulging of the disc slightly more prominent towards the right. Mild facet and ligamentous hypertrophy. No compressive stenosis. 3. L4-5: Bilateral facet osteoarthritis with 2 mm of degenerative anterolisthesis. Minimal bulging of the disc. No compressive stenosis. Facet arthritis could be painful. 4. L5-S1: Mild bulging of the disc. Mild bilateral facet osteoarthritis. No compressive stenosis or neural compression."  PATIENT SURVEYS:  FOTO 30 current, 43 predicted   SCREENING FOR RED FLAGS: Red flag screening reassuring overall, MRI as above - bowel/bladder changes: denies - bucking/overt weakness: reports chronic LLE weakness/buckling since knee surgery, no recent changes - bilateral LE numbness: denies - unintentional weight loss: denies (has been intentional)    COGNITION: Overall cognitive status: Within functional limits for tasks assessed     SENSATION: Light touch intact although slightly lessened R lateral knee No  clonus either LE  No hofman or tromner sign either UE    POSTURE: forward flexed posture, rounded shoulders, forward head  PALPATION: deferred  LUMBAR ROM:   AROM eval  Flexion Mid shin without pain  Extension Pain past neutral   Right lateral flexion   Left lateral flexion   Right rotation 75% s  Left rotation 50% s   (Blank rows = not tested) (Key: WFL =  within functional limits not formally assessed, * = concordant pain, s = stiffness/stretching sensation, NT = not tested)   LOWER EXTREMITY ROM:     Active  Right eval Left eval  Hip flexion    Hip extension    Hip internal rotation    Hip external rotation    Knee extension    Knee flexion    (Blank rows = not tested) (Key: WFL = within functional limits not formally assessed, * = concordant pain, s = stiffness/stretching sensation, NT = not tested)  Comments:    LOWER EXTREMITY MMT:    MMT Right eval Left eval  Hip flexion 3- 3-  Hip abduction (modified sitting) 4 4  Hip internal rotation    Hip external rotation    Knee flexion 4 4  Knee extension 4- 3+  Ankle dorsiflexion     (Blank rows = not tested) (Key: WFL = within functional limits not formally assessed, * = concordant pain, s = stiffness/stretching sensation, NT = not tested)  Comments:    LUMBAR SPECIAL TESTS:  deferred  FUNCTIONAL TESTS:  30secSTS: 7 repetitions heavy UE support and noted lean to R  10/27/23: 9 reps, without UE support, equal WB  GAIT: Distance walked: within clinic Assistive device utilized: Quad cane large base Level of assistance: Modified independence Comments: significant antalgic gait, reduced gait speed/cadence, increased trunk sway, intermittent steadying on nearby objects  TODAY'S TREATMENT:                                                                                                                              [facility] Adult PT Treatment:                                                DATE:  11/17/23 Therapeutic Exercise: *** Nu step Manual Therapy: *** Neuromuscular re-ed: *** Therapeutic Activity: *** Modalities: *** Self Care: ***   [facility] Adult PT Treatment:                                                DATE: 11/04/23 Therapeutic Exercise: Mini bridge 2x8 cues for breath control Hooklying march 2x8 BIL SKTC 12x3sec hold with sheet assist LTR x8 BIL emphasis on breath control Single leg seated abduction red band 2x8 BIL cues for form and pacing Red band ER 2x8 with ball as fulcrum Red band IR x10 ball as fulcrum STS 2x8 from mat HEP update + education/handout   [facility] Adult PT Treatment:  DATE: 10/27/23 Therapeutic Exercise: Nustep L4 UE/LE x 5 minutes  PPT 5 sec 2 x 10  Supine March with PPT 10 x 2  LTR x 10 PPT to Small Bridge x 10 SKTC with sheet 10 sec x 3 each  STS in 30 sec =9 without UE , standard at table      [facility] Adult PT Treatment:                                                DATE: 10/21/23 Therapeutic Exercise: LAQ 2x8 cues for foot positioning and pacing Seated march 2x8 BIL STS from rollator x5 with UE support STS from raised mat no UE support 2x5 Seated adductor iso 2x10 Red band paloff 3x8 BIL  HEP update + education/handout; discussed safe performance at home as she states she has been doing STS from rollator at times and one of the brakes is broken, observed in clinic, advised her to perform from more appropriate/sturdy surface  Therapeutic Activity: Education/discussion re: progression of activities, monitoring of symptoms and modifying activities accordingly, relevant anatomy/physiology as pertains to symptom behavior and functional mobility    PATIENT EDUCATION:  Education details: rationale for interventions, HEP  Person educated: Patient Education method: Explanation, Demonstration, Tactile cues, Verbal cues Education comprehension: verbalized understanding, returned  demonstration, verbal cues required, tactile cues required, and needs further education    HOME EXERCISE PROGRAM: Access Code: [email or web address] URL: [email or web address] Date: 11/04/2023 Prepared by: Fransisco Hertz  Exercises - Seated Long Arc Quad  - 2-3 x daily - 1 sets - 8 reps - Seated Hip Adduction Isometrics with Ball  - 2-3 x daily - 1 sets - 8 reps - Sit to Stand with Armchair  - 2-3 x daily - 1 sets - 8 reps - Supine Bridge  - 2-3 x daily - 1 sets - 10 reps - 5 hold - Supine March  - 2-3 x daily - 1 sets - 5 reps - Supine Lower Trunk Rotation  - 2-3 x daily - 1 sets - 8 reps  ASSESSMENT:  CLINICAL IMPRESSION: 11/17/2023 ***  *** Pt arrives w/ pain around baseline, reports mild relief after last session but transient. Continues to endorse about the same amount of pain overall. Today continuing to progress lumbar/hip mobility and stability exercises as able, pt denies any improvement or worsening of pain with activity. Given lack of change in symptoms, recommend trial of more standing activities in upcoming sessions to assess pt tolerance and symptom behavior as she does well tolerating supine/seated work but this does not seem to change her pain much thus far. No adverse events, pt reports mild improvement in symptoms on departure. Pt departs today's session in no acute distress, all voiced questions/concerns addressed appropriately from PT perspective.      Per eval - Patient is a pleasant 62 y.o. woman who was seen today for physical therapy evaluation and treatment for chronic low back pain worsening over past 5-6 months. She endorses increased pain with general activity but particularly with standing/walking in more upright posture. On exam she demonstrates good lumbar flexion but difficulty tolerating lumbar extension past neutral. Global LE weakness with MMT that is nonpainful, pt endorses chronic history of LE weakness since knee surgery several years ago. 30secSTS  nonpainful per pt report but is indicative of reduced activity/exercise tolerance and does demonstrate altered kinematics with  weight shift to R. Tolerates exam and HEP well without adverse event or increase in pain.  Recommend trial of skilled PT to address aforementioned deficits with aim of improving functional tolerance and reducing pain with typical activities. Pt departs today's session in no acute distress, all voiced concerns/questions addressed appropriately from PT perspective.    OBJECTIVE IMPAIRMENTS: Abnormal gait, decreased activity tolerance, decreased balance, decreased endurance, decreased mobility, difficulty walking, decreased ROM, decreased strength, impaired perceived functional ability, improper body mechanics, postural dysfunction, and pain.   ACTIVITY LIMITATIONS: carrying, lifting, bending, standing, squatting, transfers, and locomotion level  PARTICIPATION LIMITATIONS: meal prep, cleaning, laundry, and community activity  PERSONAL FACTORS: Age, Time since onset of injury/illness/exacerbation, and 3+ comorbidities: Anxiety, asthma, fibromyalgia, headaches, HTN, RA, sleep apnea  are also affecting patient's functional outcome.   REHAB POTENTIAL: Fair given chronicity and comorbidities  CLINICAL DECISION MAKING: Evolving/moderate complexity  EVALUATION COMPLEXITY: Moderate   GOALS: Goals reviewed with patient? Yes  SHORT TERM GOALS: Target date: 11/10/2023  Pt will demonstrate appropriate understanding and performance of initially prescribed HEP in order to facilitate improved independence with management of symptoms.  Baseline: HEP provided on eval Goal status: INITIAL   2. Pt will score greater than or equal to 36 on FOTO in order to demonstrate improved perception of function due to symptoms.  Baseline: 30  Goal status: INITIAL    LONG TERM GOALS: Target date: 12/08/2023   Pt will score 43 on FOTO in order to demonstrate improved perception of functional  status due to symptoms.  Baseline: 30 Goal status: INITIAL  2.  Pt will demonstrate at least 50% normal lumbar extension AROM in order to demonstrate improved tolerance to functional movement patterns.   Baseline: pain past neutral Goal status: INITIAL  3.  Pt will demonstrate global LE MMT of at least 4/5 throughout in order to demonstrate improved strength for functional movements and reduce fall risk.  Baseline: see MMT chart above Goal status: INITIAL  4. Pt will be able to perform at least 10 repetitions during 30 second sit to stand test in order to demonstrate improved exercise/activity tolerance (cutoff score for low exercise tolerance 18 repetitions in males and 16 repetitions in females per Georgina Snell al 2024)  Baseline: 7 repetitions, heavy UE support  Goal status: INITIAL    5. Pt will report at least 50% decrease in overall pain levels in past week in order to facilitate improved tolerance to basic ADLs/mobility.   Baseline: 7-10/10  Goal status: INITIAL    6. Pt will demonstrate appropriate performance of final prescribed HEP in order to facilitate improved self-management of symptoms post-discharge.   Baseline: initial HEP prescribed  Goal status: INITIAL    PLAN:  PT FREQUENCY: 1-2x/week  PT DURATION: 8 weeks  PLANNED INTERVENTIONS: 97164- PT Re-evaluation, 97110-Therapeutic exercises, 97530- Therapeutic activity, 97112- Neuromuscular re-education, 97535- Self Care, [id number]- Manual therapy, [phone]- Gait training, [phone]- Aquatic Therapy, Patient/Family education, Balance training, Stair training, Taping, Dry Needling, Joint mobilization, Spinal mobilization, Cryotherapy, and Moist heat.  PLAN FOR NEXT SESSION: Review/update HEP PRN. Pt enjoys warming up on nu step, plan to trial more standing activities within pt tolerance.    Ashley Murrain PT, DPT 11/17/2023 7:47 AM

## 2023-11-19 ENCOUNTER — Ambulatory Visit: Payer: 59 | Admitting: Physical Therapy

## 2023-11-24 ENCOUNTER — Ambulatory Visit: Payer: 59 | Admitting: Physical Therapy

## 2023-11-26 ENCOUNTER — Ambulatory Visit: Payer: 59 | Admitting: Physical Therapy

## 2023-12-01 ENCOUNTER — Ambulatory Visit (INDEPENDENT_AMBULATORY_CARE_PROVIDER_SITE_OTHER): Payer: 59 | Admitting: Plastic Surgery

## 2023-12-01 VITALS — BP 147/82 | HR 68 | Ht 67.0 in | Wt 297.0 lb

## 2023-12-01 DIAGNOSIS — M793 Panniculitis, unspecified: Secondary | ICD-10-CM | POA: Diagnosis not present

## 2023-12-01 NOTE — Progress Notes (Signed)
[name] returns today for evaluation.  She has had an additional 7 pound weight loss which is really outstanding.  Unfortunately she is still having significant back pain and has had an episode of dizziness since I saw her last time.  She states that her workup from her dizziness did not reveal any etiology.  She has had no relief from her back pain and is scheduled to see her pain doctor again for placement of a nerve stimulator.  I have asked her to continue her work on her weight loss.  She is trying to reach another 8 pounds of weight loss before we schedule surgery which will still be at a BMI higher than I normally operate on but have told her that we will proceed if she is able to lose this additional weight.  Low up in February.

## 2023-12-02 ENCOUNTER — Encounter: Payer: 59 | Admitting: Physical Therapy

## 2023-12-04 ENCOUNTER — Encounter: Payer: 59 | Admitting: Physical Therapy

## 2024-01-19 ENCOUNTER — Ambulatory Visit: Payer: 59 | Admitting: Plastic Surgery

## 2024-01-21 ENCOUNTER — Ambulatory Visit (INDEPENDENT_AMBULATORY_CARE_PROVIDER_SITE_OTHER): Payer: 59 | Admitting: Plastic Surgery

## 2024-01-21 VITALS — Wt 301.2 lb

## 2024-01-21 DIAGNOSIS — R2689 Other abnormalities of gait and mobility: Secondary | ICD-10-CM | POA: Diagnosis not present

## 2024-01-21 DIAGNOSIS — M793 Panniculitis, unspecified: Secondary | ICD-10-CM | POA: Diagnosis not present

## 2024-01-21 NOTE — Progress Notes (Signed)
 [name] returns today for evaluation.  She is still having significant discomfort from her back and now has discomfort in her right hip.  She is requiring the use of a walker to ambulate.  I also noted that she was somewhat short of breath by the time that she mated to the examination room.  We discussed her ongoing issues with weight loss.  She is considering bariatric surgery which I believe would be a good idea for her if there are no contraindications to surgery.  She is also scheduled to see her orthopedic surgeon later this week at which time she will discussed with him the possibility of getting a hip x-ray to ensure that the pain in her hip is not from a fall that she had.  Follow-up with me in 2 months or after her bariatric surgery evaluation if she has 1.

## 2024-01-29 ENCOUNTER — Other Ambulatory Visit (INDEPENDENT_AMBULATORY_CARE_PROVIDER_SITE_OTHER): Payer: 59

## 2024-01-29 ENCOUNTER — Ambulatory Visit: Payer: 59 | Admitting: Physician Assistant

## 2024-01-29 ENCOUNTER — Encounter: Payer: Self-pay | Admitting: Physician Assistant

## 2024-01-29 ENCOUNTER — Other Ambulatory Visit: Payer: Self-pay

## 2024-01-29 VITALS — Ht 67.0 in | Wt 298.8 lb

## 2024-01-29 DIAGNOSIS — M25561 Pain in right knee: Secondary | ICD-10-CM

## 2024-01-29 DIAGNOSIS — M25551 Pain in right hip: Secondary | ICD-10-CM

## 2024-01-29 DIAGNOSIS — M1711 Unilateral primary osteoarthritis, right knee: Secondary | ICD-10-CM

## 2024-01-29 DIAGNOSIS — G8929 Other chronic pain: Secondary | ICD-10-CM

## 2024-01-29 DIAGNOSIS — M25552 Pain in left hip: Secondary | ICD-10-CM

## 2024-01-29 NOTE — Progress Notes (Signed)
HPI: [name] returns today due to right hip pain no groin pain.  She is not really pointing to the mid thigh on the right side.  She has had no known injury.  She has known significant arthritis of the right knee.  She states she is unable to stand for any length of time due to pain in the knee she is now using a rollator to ambulate.  She is on Ozempic for weight loss and for prediabetes.  She has been on a weight loss journey has lost down from 328 pounds and now down to below 300 pounds.  She has tried ibuprofen and is in pain management and takes oxycodone for pain.  History of left total knee arthroplasty performed by Dr. Raye Sorrow in 2019 which she states she has no pain in the knee.  She has failed cortisone injections in the past.  She is wanting to discuss knee surgery.  Review of systems: Denies any fevers chills or ongoing infections.  Physical exam: Height 5 foot 7 weight 298 pounds BMI 46.8 General Well-developed well-nourished female who ambulates with antalgic gait and uses a rollator. Bilateral hips good range of motion of both hips without pain. Left knee good range of motion well-healed surgical incision.  Right knee full extension.  Patellofemoral crepitus with passive range of motion.  Global tenderness throughout.  Slight varus deformity.  No abnormal warmth erythema.  Radiographs: 3 views right knee: No acute fracture knee is well located.  Tricompartmental arthritis with bone-on-bone medial compartment.  Severe patellofemoral arthritic changes.  Periarticular spurring off the lateral compartment. AP pelvis lateral view right hip: Bilateral hips well located.  No acute fractures.  Bilateral hip joints overall well-maintained.  Impression: End-stage right knee arthritis Right hip pain  Plan: Given patient's failure of conservative treatment which is included medications such as NSAIDs and pain meds along with cortical steroid injections.  Recommend right total knee arthroplasty.   Risk benefits of surgery discussed.  Increased risk given patient's weight was also discussed.  Risk include but are not limited to infection, hardware failure, nerve vessel injury, blood loss and DVT/PE.  She will continue to work on weight loss.  Will schedule her for a right knee replacement in the near future.  Questions were encouraged and answered.  Explained to her that I felt that her thigh pain was mostly coming from her knee arthritis.

## 2024-02-16 ENCOUNTER — Ambulatory Visit: Payer: 59 | Admitting: Orthopaedic Surgery

## 2024-03-13 NOTE — Patient Instructions (Signed)
 SURGICAL WAITING ROOM VISITATION Patients having surgery or a procedure may have no more than 2 support people in the waiting area - these visitors may rotate in the visitor waiting room.   Due to an increase in RSV and influenza rates and associated hospitalizations, children ages 60 and under may not visit patients in [facility] hospitals. If the patient needs to stay at the hospital during part of their recovery, the visitor guidelines for inpatient rooms apply.  PRE-OP VISITATION  Pre-op nurse will coordinate an appropriate time for 1 support person to accompany the patient in pre-op.  This support person may not rotate.  This visitor will be contacted when the time is appropriate for the visitor to come back in the pre-op area.  Please refer to the [facility] website for the visitor guidelines for Inpatients (after your surgery is over and you are in a regular room).  You are not required to quarantine at this time prior to your surgery. However, you must do this: Hand Hygiene often Do NOT share personal items Notify your provider if you are in close contact with someone who has COVID or you develop fever 100.4 or greater, new onset of sneezing, cough, sore throat, shortness of breath or body aches.  If you test positive for Covid or have been in contact with anyone that has tested positive in the last 10 days please notify you surgeon.    Your procedure is scheduled on:  FRIDAY  March 19, 2024  Report to [facility] Main Entrance: [facility]sen entrance where the [employer] is available.   Report to admitting at: 08:45  AM  Call this number if you have any questions or problems the morning of surgery [phone]  Do not eat food after Midnight the night prior to your surgery/procedure.  After Midnight you may have the following liquids until  08:15 AM  DAY OF SURGERY  Clear Liquid Diet Water Black Coffee (sugar ok, NO MILK/CREAM OR CREAMERS)  Tea (sugar ok, NO  MILK/CREAM OR CREAMERS) regular and decaf                             Plain Jell-O  with no fruit (NO RED)                                           Fruit ices (not with fruit pulp, NO RED)                                     Popsicles (NO RED)                                                                  Juice: NO CITRUS JUICES: only apple, WHITE grape, WHITE cranberry Sports drinks like Gatorade or Powerade (NO RED)                   The day of surgery:  Drink ONE (1) Pre-Surgery G2 at  08:15 AM the morning of surgery. Drink in one  sitting. Do not sip.  This drink was given to you during your hospital pre-op appointment visit. Nothing else to drink after completing the Pre-Surgery G2 : No candy, chewing gum or throat lozenges.    FOLLOW ANY ADDITIONAL PRE OP INSTRUCTIONS YOU RECEIVED FROM YOUR SURGEON'S OFFICE!!!   Oral Hygiene is also important to reduce your risk of infection.        Remember - BRUSH YOUR TEETH THE MORNING OF SURGERY WITH YOUR REGULAR TOOTHPASTE  Do NOT smoke after Midnight the night before surgery.  STOP TAKING all Vitamins, Herbs and supplements 1 week before your surgery.   Take ONLY these medicines the morning of surgery with A SIP OF WATER: atenolol, cetirizine. You may take alprazolam and Oxycodone if needed.  If needed, you may use your Flonase nasal spray and your Inhalers/ nebulizer. Please bring your Albuterol Inhaler with you on the surgery day.    You may not have any metal on your body including hair pins, jewelry, and body piercing  Do not wear make-up, lotions, powders, perfumes  or deodorant  Do not wear nail polish including gel and S&S, artificial / acrylic nails, or any other type of covering on natural nails including finger and toenails. If you have artificial nails, gel coating, etc., that needs to be removed by a nail salon, Please have this removed prior to surgery. Not doing so may mean that your surgery could be cancelled or delayed if  the Surgeon or anesthesia staff feels like they are unable to monitor you safely.   Do not shave 48 hours prior to surgery to avoid nicks in your skin which may contribute to postoperative infections.   Contacts, Hearing Aids, dentures or bridgework may not be worn into surgery. DENTURES WILL BE REMOVED PRIOR TO SURGERY PLEASE DO NOT APPLY "Poly grip" OR ADHESIVES!!!  You may bring a small overnight bag with you on the day of surgery, only pack items that are not valuable. [facility] IS NOT RESPONSIBLE   FOR VALUABLES THAT ARE LOST OR STOLEN.   Do not bring your home medications to the hospital. The Pharmacy will dispense medications listed on your medication list to you during your admission in the Hospital.  Please read over the following fact sheets you were given: IF YOU HAVE QUESTIONS ABOUT YOUR PRE-OP INSTRUCTIONS, PLEASE CALL [phone].     Pre-operative 5 CHG Bath Instructions   You can play a key role in reducing the risk of infection after surgery. Your skin needs to be as free of germs as possible. You can reduce the number of germs on your skin by washing with CHG (chlorhexidine gluconate) soap before surgery. CHG is an antiseptic soap that kills germs and continues to kill germs even after washing.   DO NOT use if you have an allergy to chlorhexidine/CHG or antibacterial soaps. If your skin becomes reddened or irritated, stop using the CHG and notify one of our RNs at [phone]  Please shower with the CHG soap starting 4 days before surgery using the following schedule: START SHOWERS ON FRIDAY  March 15, 2024  Please keep in mind the following:  DO NOT shave, including legs and underarms, starting the day of your first shower.   You may shave your face at any point before/day of surgery.   Place  clean sheets on your bed the day you start using CHG soap. Use a clean washcloth (not used since being washed) for each shower. DO NOT sleep with pets once you start using the CHG.   CHG Shower Instructions:  If you choose to wash your hair and private area, wash first with your normal shampoo/soap.  After you use shampoo/soap, rinse your hair and body thoroughly to remove shampoo/soap residue.  Turn the water OFF and apply about 3 tablespoons (45 ml) of CHG soap to a CLEAN washcloth.  Apply CHG soap ONLY FROM YOUR NECK DOWN TO YOUR TOES (washing for 3-5 minutes)  DO NOT use CHG soap on face, private areas, open wounds, or sores.  Pay special attention to the area where your surgery is being performed.  If you are having back surgery, having someone wash your back for you may be helpful.  Wait 2 minutes after CHG soap is applied, then you may rinse off the CHG soap.  Pat dry with a clean towel  Put on clean clothes/pajamas   If you choose to wear lotion, please use ONLY the CHG-compatible lotions on the back of this paper.     Additional instructions for the day of surgery: DO NOT APPLY any lotions, deodorants, cologne, or perfumes.   Put on clean/comfortable clothes.  Brush your teeth.  Ask your nurse before applying any prescription medications to the skin.      CHG Compatible Lotions   Aveeno Moisturizing lotion  Cetaphil Moisturizing Cream  Cetaphil Moisturizing Lotion  Clairol Herbal Essence Moisturizing Lotion, Dry Skin  Clairol Herbal Essence Moisturizing Lotion, Extra Dry Skin  Clairol Herbal Essence Moisturizing Lotion, Normal Skin  Curel Age Defying Therapeutic Moisturizing Lotion with Alpha Hydroxy  Curel Extreme Care Body Lotion  Curel Soothing Hands Moisturizing Hand Lotion  Curel Therapeutic Moisturizing Cream, Fragrance-Free  Curel Therapeutic Moisturizing Lotion, Fragrance-Free  Curel Therapeutic Moisturizing Lotion, Original Formula  Eucerin Daily  Replenishing Lotion  Eucerin Dry Skin Therapy Plus Alpha Hydroxy Crme  Eucerin Dry Skin Therapy Plus Alpha Hydroxy Lotion  Eucerin Original Crme  Eucerin Original Lotion  Eucerin Plus Crme Eucerin Plus Lotion  Eucerin TriLipid Replenishing Lotion  Keri Anti-Bacterial Hand Lotion  Keri Deep Conditioning Original Lotion Dry Skin Formula Softly Scented  Keri Deep Conditioning Original Lotion, Fragrance Free Sensitive Skin Formula  Keri Lotion Fast Absorbing Fragrance Free Sensitive Skin Formula  Keri Lotion Fast Absorbing Softly Scented Dry Skin Formula  Keri Original Lotion  Keri Skin Renewal Lotion Keri Silky Smooth Lotion  Keri Silky Smooth Sensitive Skin Lotion  Nivea Body Creamy Conditioning Oil  Nivea Body Extra Enriched Lotion  Nivea Body Original Lotion  Nivea Body Sheer Moisturizing Lotion Nivea Crme  Nivea Skin Firming Lotion  NutraDerm 30 Skin Lotion  NutraDerm Skin Lotion  NutraDerm Therapeutic Skin Cream  NutraDerm Therapeutic Skin Lotion  ProShield Protective Hand Cream  Provon moisturizing lotion   FAILURE TO FOLLOW THESE INSTRUCTIONS MAY RESULT IN THE CANCELLATION OF YOUR SURGERY  PATIENT SIGNATURE_________________________________  NURSE SIGNATURE__________________________________  ________________________________________________________________________         [name]    An incentive spirometer is a tool that can help keep your lungs clear and active. This tool measures how well you are filling your lungs with each  breath. Taking long deep breaths may help reverse or decrease the chance of developing breathing (pulmonary) problems (especially infection) following: A long period of time when you are unable to move or be active. BEFORE THE PROCEDURE  If the spirometer includes an indicator to show your best effort, your nurse or respiratory therapist will set it to a desired goal. If possible, sit up straight or lean slightly forward. Try  not to slouch. Hold the incentive spirometer in an upright position. INSTRUCTIONS FOR USE  Sit on the edge of your bed if possible, or sit up as far as you can in bed or on a chair. Hold the incentive spirometer in an upright position. Breathe out normally. Place the mouthpiece in your mouth and seal your lips tightly around it. Breathe in slowly and as deeply as possible, raising the piston or the ball toward the top of the column. Hold your breath for 3-5 seconds or for as long as possible. Allow the piston or ball to fall to the bottom of the column. Remove the mouthpiece from your mouth and breathe out normally. Rest for a few seconds and repeat Steps 1 through 7 at least 10 times every 1-2 hours when you are awake. Take your time and take a few normal breaths between deep breaths. The spirometer may include an indicator to show your best effort. Use the indicator as a goal to work toward during each repetition. After each set of 10 deep breaths, practice coughing to be sure your lungs are clear. If you have an incision (the cut made at the time of surgery), support your incision when coughing by placing a pillow or rolled up towels firmly against it. Once you are able to get out of bed, walk around indoors and cough well. You may stop using the incentive spirometer when instructed by your caregiver.  RISKS AND COMPLICATIONS Take your time so you do not get dizzy or light-headed. If you are in pain, you may need to take or ask for pain medication before doing incentive spirometry. It is harder to take a deep breath if you are having pain. AFTER USE Rest and breathe slowly and easily. It can be helpful to keep track of a log of your progress. Your caregiver can provide you with a simple table to help with this. If you are using the spirometer at home, follow these instructions: SEEK MEDICAL CARE IF:  You are having difficultly using the spirometer. You have trouble using the spirometer as  often as instructed. Your pain medication is not giving enough relief while using the spirometer. You develop fever of 100.5 F (38.1 C) or higher.                                                                                                    SEEK IMMEDIATE MEDICAL CARE IF:  You cough up bloody sputum that had not been present before. You develop fever of 102 F (38.9 C) or greater. You develop worsening pain at or near the incision site. MAKE SURE YOU:  Understand these instructions. Will watch  your condition. Will get help right away if you are not doing well or get worse. Document Released: 04/14/2007 Document Revised: 02/24/2012 Document Reviewed: 06/15/2007 [facility] Patient Information 2014 [place], [place].       If you would like to see a video about joint replacement:   [email or web address]

## 2024-03-13 NOTE — Progress Notes (Signed)
 COVID Vaccine received:  [x]  No []  Yes Date of any COVID positive Test in last 90 days:  none  PCP - Fleet Contras, MD [phone] Cardiologist - none Pain Mgmt- [facility]  [phone]  Chest x-ray - 11-11-2021 2v  Epic EKG -  11-09-2023   Epic Stress Test -  ECHO -  Cardiac Cath -   PCR screen: [x]  Ordered & Completed []   No Order but Needs PROFEND     []   N/A for this surgery  Surgery Plan:  []  Ambulatory   [x]  Outpatient in bed  []  Admit Anesthesia:    []  General  []  Spinal  [x]   Choice []   MAC  Pacemaker / ICD device [x]  No []  Yes   Spinal Cord Stimulator:[x]  No []  Yes  Attempted but aborted procedure d/t body habitus.      History of Sleep Apnea? []  No [x]  Yes   CPAP used?- [x]  No []  Yes    Does the patient monitor blood sugar?   []  N/A   [x]  No []  Yes  Patient has: []  NO Hx DM   [x]  Pre-DM   []  DM1  []   DM2 Last A1c was: 5.9 on  02-21-2021     Does patient have a [employer] or Dexcom? [x]  No []  Yes   Fasting Blood Sugar Ranges-  Checks Blood Sugar 0 times a day OZEMPIC- not currently taking   Blood Thinner / Instructions:  none Aspirin Instructions:  none  ERAS Protocol Ordered: []  No  [x]  Yes PRE-SURGERY []  ENSURE  [x]  G2    Patient is to be NPO after: 0815  Dental hx: []  Dentures:  [x]  N/A      []  Bridge or Partial:                   []  Loose or Damaged teeth:   Comments: Patient was given the 5 CHG shower / bath instructions for TKA  surgery along with 2 bottles of the CHG soap. Patient will start this on: 03-15-2024  All questions were asked and answered, Patient voiced understanding of this process.   Activity level: Patient is unable to climb a flight of stairs without difficulty; [x]  No CP  but would have SOB, and leg pain. Patient can perform ADLs without assistance.   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 Fibromyalgia  Patient denies shortness of  breath, fever, cough and chest pain at PAT appointment.  Patient verbalized understanding and agreement to the Pre-Surgical Instructions that were given to them at this PAT appointment. Patient was also educated of the need to review these PAT instructions again prior to her surgery.I reviewed the appropriate phone numbers to call if they have any and questions or concerns.

## 2024-03-15 ENCOUNTER — Other Ambulatory Visit: Payer: Self-pay

## 2024-03-15 ENCOUNTER — Encounter (HOSPITAL_COMMUNITY): Payer: Self-pay

## 2024-03-15 ENCOUNTER — Encounter (HOSPITAL_COMMUNITY)
Admission: RE | Admit: 2024-03-15 | Discharge: 2024-03-15 | Disposition: A | Discharge status: Home / Self Care | Source: Ambulatory Visit | Attending: Orthopaedic Surgery | Admitting: Orthopaedic Surgery

## 2024-03-15 VITALS — BP 112/68 | HR 54 | Temp 98.6°F | Resp 22 | Ht 67.0 in | Wt 294.0 lb

## 2024-03-15 DIAGNOSIS — Z01812 Encounter for preprocedural laboratory examination: Secondary | ICD-10-CM | POA: Insufficient documentation

## 2024-03-15 DIAGNOSIS — I10 Essential (primary) hypertension: Secondary | ICD-10-CM

## 2024-03-15 DIAGNOSIS — M1711 Unilateral primary osteoarthritis, right knee: Secondary | ICD-10-CM | POA: Insufficient documentation

## 2024-03-15 DIAGNOSIS — Z79891 Long term (current) use of opiate analgesic: Secondary | ICD-10-CM | POA: Diagnosis not present

## 2024-03-15 DIAGNOSIS — R7303 Prediabetes: Secondary | ICD-10-CM

## 2024-03-15 DIAGNOSIS — Z01818 Encounter for other preprocedural examination: Secondary | ICD-10-CM

## 2024-03-15 HISTORY — DX: Prediabetes: R73.03

## 2024-03-15 HISTORY — DX: Gastro-esophageal reflux disease without esophagitis: K21.9

## 2024-03-15 LAB — COMPREHENSIVE METABOLIC PANEL WITH GFR
ALT: 19 U/L (ref 0–44)
AST: 17 U/L (ref 15–41)
Albumin: 3.7 g/dL (ref 3.5–5.0)
Alkaline Phosphatase: 71 U/L (ref 38–126)
Anion gap: 8 (ref 5–15)
BUN: 17 mg/dL (ref 8–23)
CO2: 26 mmol/L (ref 22–32)
Calcium: 9.2 mg/dL (ref 8.9–10.3)
Chloride: 103 mmol/L (ref 98–111)
Creatinine, Ser: 0.83 mg/dL (ref 0.44–1.00)
GFR, Estimated: >60 mL/min (ref >60)
Glucose, Bld: 98 mg/dL (ref 70–99)
Potassium: 3.7 mmol/L (ref 3.5–5.1)
Sodium: 137 mmol/L (ref 135–145)
Total Bilirubin: 0.4 mg/dL (ref 0.0–1.2)
Total Protein: 6.9 g/dL (ref 6.5–8.1)

## 2024-03-15 LAB — SURGICAL PCR SCREEN
MRSA, PCR: NEGATIVE
Staphylococcus aureus: POSITIVE — AB

## 2024-03-15 LAB — CBC
HCT: 38.8 % (ref 36.0–46.0)
Hemoglobin: 12.7 g/dL (ref 12.0–15.0)
MCH: 30.9 pg (ref 26.0–34.0)
MCHC: 32.7 g/dL (ref 30.0–36.0)
MCV: 94.4 fL (ref 80.0–100.0)
Platelets: 371 10*3/uL (ref 150–400)
RBC: 4.11 MIL/uL (ref 3.87–5.11)
RDW: 13.3 % (ref 11.5–15.5)
WBC: 4.8 10*3/uL (ref 4.0–10.5)
nRBC: 0 % (ref 0.0–0.2)

## 2024-03-17 NOTE — Progress Notes (Signed)
 Patient's PCR screen is positive for STAPH. Appropriate notes have been placed on the patient's chart. This note has been routed to [facility] for review. The Patient's surgery is currently scheduled for: 03-19-24 at [facility].  Rudean Haskell, BSN, CVRN-BC   Pre-Surgical Testing Nurse [facility]  [phone]

## 2024-03-18 ENCOUNTER — Telehealth: Payer: Self-pay

## 2024-03-18 NOTE — Telephone Encounter (Signed)
 Spoke with patient; she was wondering about the staph test coming back positive asking if this would effect her having surgery; I let her know that it would not

## 2024-03-18 NOTE — H&P (Signed)
 TOTAL KNEE ADMISSION H&P  Patient is being admitted for right total knee arthroplasty.  Subjective:  Chief Complaint:right knee pain.  HPI: [name], 63 y.o. female, has a history of pain and functional disability in the right knee due to arthritis and has failed non-surgical conservative treatments for greater than 12 weeks to includeNSAID's and/or analgesics, corticosteriod injections, flexibility and strengthening excercises, use of assistive devices, weight reduction as appropriate, and activity modification.  Onset of symptoms was gradual, starting many years ago with gradually worsening course since that time. The patient noted no past surgery on the right knee(s).  Patient currently rates pain in the right knee(s) at 10 out of 10 with activity. Patient has night pain, worsening of pain with activity and weight bearing, pain that interferes with activities of daily living, pain with passive range of motion, crepitus, and joint swelling.  Patient has evidence of subchondral sclerosis, periarticular osteophytes, and joint space narrowing by imaging studies. There is no active infection.  Patient Active Problem List   Diagnosis Date Noted   Visit for routine gyn exam 02/25/2022   Vitamin D deficiency 02/21/2021   Essential hypertension 02/21/2021   Pure hypercholesterolemia 02/21/2021   Class 3 severe obesity with serious comorbidity and body mass index (BMI) of 50.0 to 59.9 in adult [facility]) 06/13/2020   Prediabetes 06/13/2020   Leg pain 06/13/2020   Unilateral primary osteoarthritis, right knee 02/02/2020   Status post total left knee replacement 01/17/2017   Degenerative disc disease, cervical    Past Medical History:  Diagnosis Date   Anxiety    Asthma    Back pain    Chronic pain    DDD (degenerative disc disease), lumbar    Degenerative disc disease    Degenerative disc disease, cervical    Degenerative disc disease, cervical    Fibroid    Fibromyalgia    GERD  (gastroesophageal reflux disease)    Headache    regular   Heartburn    Hypertension    Joint pain    Localized swelling of both lower legs    Osteoarthritis    Other specified disorders of thyroid    Pre-diabetes    Rheumatoid arthritis (HCC)    Sleep apnea    mild, patient does not use mask   SOB (shortness of breath)     Past Surgical History:  Procedure Laterality Date   ANTERIOR CERVICAL DECOMP/DISCECTOMY FUSION  1998   C4-7   NOVASURE ABLATION     TOTAL KNEE ARTHROPLASTY Left 01/17/2017   Procedure: LEFT TOTAL KNEE ARTHROPLASTY;  Surgeon: [name], MD;  Location: [facility] ORS;  Service: Orthopedics;  Laterality: Left;   TUBAL LIGATION      No current facility-administered medications for this encounter.   Current Outpatient Medications  Medication Sig Dispense Refill Last Dose/Taking   albuterol (PROVENTIL HFA;VENTOLIN HFA) 108 (90 BASE) MCG/ACT inhaler Inhale 2 puffs into the lungs every 6 (six) hours as needed for shortness of breath.    Taking As Needed   albuterol (PROVENTIL) (2.5 MG/3ML) 0.083% nebulizer solution Take 2.5 mg by nebulization every 6 (six) hours as needed for wheezing or shortness of breath.   Taking As Needed   ALPRAZolam (XANAX) 1 MG tablet Take 1 mg by mouth daily as needed for anxiety.   Taking As Needed   atenolol (TENORMIN) 50 MG tablet Take 50 mg by mouth daily.   Taking   cetirizine (ZYRTEC) 10 MG tablet Take 10 mg by mouth daily.  Taking   FLOVENT HFA 220 MCG/ACT inhaler Inhale 2 puffs into the lungs 2 (two) times daily as needed (shortness of breath).  5 Taking As Needed   fluticasone (FLONASE) 50 MCG/ACT nasal spray Place 2 sprays into both nostrils daily as needed for allergies.   Taking As Needed   furosemide (LASIX) 20 MG tablet Take 20 mg by mouth daily as needed for fluid or edema.   Taking As Needed   ibuprofen (ADVIL,MOTRIN) 800 MG tablet Take 800 mg by mouth 3 (three) times daily as needed for moderate pain.   5 Taking As  Needed   losartan-hydrochlorothiazide (HYZAAR) 100-25 MG tablet Take 1 tablet by mouth daily.   Taking   meclizine (ANTIVERT) 25 MG tablet Take 1 tablet (25 mg total) by mouth 3 (three) times daily as needed for dizziness. 30 tablet 0 Taking As Needed   NOREL AD 4-10-325 MG TABS Take 1 tablet by mouth 2 (two) times daily as needed (allergies).   Taking As Needed   Oxycodone HCl 20 MG TABS TAKE 1 TABLET BY MOUTH EVERY 4-6 HOURS (MAX OF 5 PER DAY)  0 Taking   tiZANidine (ZANAFLEX) 4 MG tablet Take 4 mg by mouth 2 (two) times daily as needed.   Taking As Needed   Cholecalciferol (VITAMIN D3) 125 MCG (5000 UT) CAPS Take 1 capsule (5,000 Units total) by mouth daily. 30 capsule 0    diazepam (VALIUM) 5 MG tablet Take one tab by mouth one hour prior to MRI, repeat as needed (Patient not taking: Reported on 03/10/2024) 2 tablet 0 Not Taking   Insulin Pen Needle (BD PEN NEEDLE NANO 2ND GEN) 32G X 4 MM MISC 1 Package by Does not apply route 2 (two) times daily. 100 each 0    OZEMPIC, 0.25 OR 0.5 MG/DOSE, 2 MG/1.5ML SOPN INJECT 0.25MG  INTO THE SKIN ONE TIME PER WEEK (Patient not taking: Reported on 03/10/2024) 1.5 mL 0 Not Taking   potassium chloride SA (KLOR-CON) 20 MEQ tablet Take 2 tablets (40 mEq total) by mouth daily for 7 days. (Patient not taking: Reported on 03/10/2024) 14 tablet 0 Not Taking   Semaglutide, 2 MG/DOSE, (OZEMPIC, 2 MG/DOSE,) 8 MG/3ML SOPN Inject 2 mg into the skin once a week. (Patient not taking: Reported on 03/10/2024) 3 mL 2 Not Taking   No Known Allergies  Social History   Tobacco Use   Smoking status: Never   Smokeless tobacco: Never  Substance Use Topics   Alcohol use: Not Currently    Family History  Problem Relation Age of Onset   Cancer Mother    Hypertension Mother    Eating disorder Mother    Obesity Mother    Diabetes Father    Heart disease Father    High Cholesterol Father    Kidney disease Father    Alcoholism Father    Eating disorder Father    Cancer Sister     Cancer Brother      Review of Systems  Objective:  Physical Exam Vitals reviewed.  Constitutional:      Appearance: Normal appearance. She is obese.  HENT:     Head: Normocephalic and atraumatic.  Eyes:     Extraocular Movements: Extraocular movements intact.     Pupils: Pupils are equal, round, and reactive to light.  Cardiovascular:     Rate and Rhythm: Normal rate and regular rhythm.  Pulmonary:     Effort: Pulmonary effort is normal.     Breath sounds: Normal breath  sounds.  Abdominal:     Palpations: Abdomen is soft.  Musculoskeletal:     Cervical back: Normal range of motion and neck supple.     Right knee: Effusion, bony tenderness and crepitus present. Decreased range of motion. Tenderness present over the medial joint line and lateral joint line. Abnormal alignment.  Neurological:     Mental Status: She is alert and oriented to person, place, and time.  Psychiatric:        Behavior: Behavior normal.     Vital signs in last 24 hours:    Labs:   Estimated body mass index is 46.05 kg/m as calculated from the following:   Height as of 03/15/24: 5\' 7"  (1.702 m).   Weight as of 03/15/24: 133.4 kg.   Imaging Review Plain radiographs demonstrate severe degenerative joint disease of the right knee(s). The overall alignment ismild varus. The bone quality appears to be good for age and reported activity level.      Assessment/Plan:  End stage arthritis, right knee   The patient history, physical examination, clinical judgment of the provider and imaging studies are consistent with end stage degenerative joint disease of the right knee(s) and total knee arthroplasty is deemed medically necessary. The treatment options including medical management, injection therapy arthroscopy and arthroplasty were discussed at length. The risks and benefits of total knee arthroplasty were presented and reviewed. The risks due to aseptic loosening, infection, stiffness, patella  tracking problems, thromboembolic complications and other imponderables were discussed. The patient acknowledged the explanation, agreed to proceed with the plan and consent was signed. Patient is being admitted for inpatient treatment for surgery, pain control, PT, OT, prophylactic antibiotics, VTE prophylaxis, progressive ambulation and ADL's and discharge planning. The patient is planning to be discharged home with home health services

## 2024-03-18 NOTE — Telephone Encounter (Signed)
 Surgery is tomorrow.  Has question about a test result.  Please call.

## 2024-03-19 ENCOUNTER — Other Ambulatory Visit: Payer: Self-pay

## 2024-03-19 ENCOUNTER — Encounter (HOSPITAL_COMMUNITY): Payer: Self-pay | Admitting: Orthopaedic Surgery

## 2024-03-19 ENCOUNTER — Ambulatory Visit (HOSPITAL_COMMUNITY): Admitting: Registered Nurse

## 2024-03-19 ENCOUNTER — Encounter (HOSPITAL_COMMUNITY)
Admission: RE | Disposition: A | Discharge status: Home / Self Care | Payer: Self-pay | Source: Home / Self Care | Attending: Orthopaedic Surgery

## 2024-03-19 ENCOUNTER — Observation Stay (HOSPITAL_COMMUNITY)

## 2024-03-19 ENCOUNTER — Observation Stay (HOSPITAL_COMMUNITY)
Admission: RE | Admit: 2024-03-19 | Discharge: 2024-03-23 | Disposition: A | Discharge status: Home / Self Care | Attending: Orthopaedic Surgery | Admitting: Orthopaedic Surgery

## 2024-03-19 DIAGNOSIS — J45909 Unspecified asthma, uncomplicated: Secondary | ICD-10-CM | POA: Diagnosis not present

## 2024-03-19 DIAGNOSIS — Z79899 Other long term (current) drug therapy: Secondary | ICD-10-CM | POA: Insufficient documentation

## 2024-03-19 DIAGNOSIS — M1711 Unilateral primary osteoarthritis, right knee: Principal | ICD-10-CM

## 2024-03-19 DIAGNOSIS — I10 Essential (primary) hypertension: Secondary | ICD-10-CM

## 2024-03-19 DIAGNOSIS — Z96652 Presence of left artificial knee joint: Secondary | ICD-10-CM | POA: Diagnosis not present

## 2024-03-19 DIAGNOSIS — Z794 Long term (current) use of insulin: Secondary | ICD-10-CM | POA: Insufficient documentation

## 2024-03-19 DIAGNOSIS — G4733 Obstructive sleep apnea (adult) (pediatric): Secondary | ICD-10-CM | POA: Diagnosis not present

## 2024-03-19 DIAGNOSIS — Z96651 Presence of right artificial knee joint: Secondary | ICD-10-CM

## 2024-03-19 HISTORY — PX: TOTAL KNEE ARTHROPLASTY: SHX125

## 2024-03-19 LAB — BASIC METABOLIC PANEL WITH GFR
Anion gap: 7 (ref 5–15)
BUN: 19 mg/dL (ref 8–23)
CO2: 26 mmol/L (ref 22–32)
Calcium: 8.9 mg/dL (ref 8.9–10.3)
Chloride: 103 mmol/L (ref 98–111)
Creatinine, Ser: 0.79 mg/dL (ref 0.44–1.00)
GFR, Estimated: >60 mL/min (ref >60)
Glucose, Bld: 108 mg/dL — ABNORMAL HIGH (ref 70–99)
Potassium: 4.6 mmol/L (ref 3.5–5.1)
Sodium: 136 mmol/L (ref 135–145)

## 2024-03-19 SURGERY — ARTHROPLASTY, KNEE, TOTAL
Anesthesia: Spinal | Site: Knee | Laterality: Right

## 2024-03-19 MED ORDER — LOSARTAN POTASSIUM 50 MG PO TABS
100.0000 mg | ORAL_TABLET | Freq: Every day | ORAL | Status: DC
  Administered 2024-03-19 – 2024-03-21 (×3): 100 mg via ORAL
  Filled 2024-03-19 (×3): qty 2

## 2024-03-19 MED ORDER — ACETAMINOPHEN 10 MG/ML IV SOLN: 1000.0000 mg | Freq: Once | INTRAVENOUS | Status: DC | PRN

## 2024-03-19 MED ORDER — HYDROMORPHONE HCL 1 MG/ML IJ SOLN
0.5000 mg | INTRAMUSCULAR | Status: DC | PRN
  Administered 2024-03-19 – 2024-03-20 (×2): 1 mg via INTRAVENOUS
  Filled 2024-03-19 (×2): qty 1

## 2024-03-19 MED ORDER — PROPOFOL 500 MG/50ML IV EMUL
INTRAVENOUS | Status: DC | PRN
  Administered 2024-03-19: 40 ug/kg/min via INTRAVENOUS

## 2024-03-19 MED ORDER — MEPERIDINE HCL 50 MG/ML IJ SOLN: 6.2500 mg | INTRAMUSCULAR | Status: DC | PRN

## 2024-03-19 MED ORDER — METOCLOPRAMIDE HCL 5 MG PO TABS: 5.0000 mg | ORAL_TABLET | Freq: Three times a day (TID) | ORAL | Status: DC | PRN

## 2024-03-19 MED ORDER — HYDROMORPHONE HCL 1 MG/ML IJ SOLN: 0.2500 mg | INTRAMUSCULAR | Status: DC | PRN

## 2024-03-19 MED ORDER — MUPIROCIN 2 % EX OINT
1.0000 | TOPICAL_OINTMENT | Freq: Two times a day (BID) | CUTANEOUS | 0 refills | Status: AC
Start: 2024-03-19 — End: 2024-04-18

## 2024-03-19 MED ORDER — CHLORHEXIDINE GLUCONATE 0.12 % MT SOLN
15.0000 mL | Freq: Once | OROMUCOSAL | Status: AC
  Administered 2024-03-19: 15 mL via OROMUCOSAL

## 2024-03-19 MED ORDER — ACETAMINOPHEN 10 MG/ML IV SOLN
INTRAVENOUS | Status: AC
  Filled 2024-03-19: qty 100

## 2024-03-19 MED ORDER — DEXAMETHASONE SODIUM PHOSPHATE 10 MG/ML IJ SOLN
INTRAMUSCULAR | Status: AC
  Filled 2024-03-19: qty 1

## 2024-03-19 MED ORDER — ONDANSETRON HCL 4 MG PO TABS: 4.0000 mg | ORAL_TABLET | Freq: Four times a day (QID) | ORAL | Status: DC | PRN

## 2024-03-19 MED ORDER — DOCUSATE SODIUM 100 MG PO CAPS
100.0000 mg | ORAL_CAPSULE | Freq: Two times a day (BID) | ORAL | Status: DC
  Administered 2024-03-19 – 2024-03-23 (×8): 100 mg via ORAL
  Filled 2024-03-19 (×8): qty 1

## 2024-03-19 MED ORDER — STERILE WATER FOR IRRIGATION IR SOLN
Status: DC | PRN
  Administered 2024-03-19: 1000 mL

## 2024-03-19 MED ORDER — ACETAMINOPHEN 10 MG/ML IV SOLN
INTRAVENOUS | Status: DC | PRN
  Administered 2024-03-19: 1000 mg via INTRAVENOUS

## 2024-03-19 MED ORDER — BUPIVACAINE-EPINEPHRINE (PF) 0.25% -1:200000 IJ SOLN
INTRAMUSCULAR | Status: AC
  Filled 2024-03-19: qty 30

## 2024-03-19 MED ORDER — ONDANSETRON HCL 4 MG/2ML IJ SOLN: 4.0000 mg | Freq: Four times a day (QID) | INTRAMUSCULAR | Status: DC | PRN

## 2024-03-19 MED ORDER — PHENOL 1.4 % MT LIQD: 1.0000 | OROMUCOSAL | Status: DC | PRN

## 2024-03-19 MED ORDER — ONDANSETRON HCL 4 MG/2ML IJ SOLN
INTRAMUSCULAR | Status: AC
  Filled 2024-03-19: qty 2

## 2024-03-19 MED ORDER — PANTOPRAZOLE SODIUM 40 MG PO TBEC
40.0000 mg | DELAYED_RELEASE_TABLET | Freq: Every day | ORAL | Status: DC
  Administered 2024-03-21: 40 mg via ORAL
  Filled 2024-03-19 (×3): qty 1

## 2024-03-19 MED ORDER — 0.9 % SODIUM CHLORIDE (POUR BTL) OPTIME
TOPICAL | Status: DC | PRN
  Administered 2024-03-19: 1000 mL

## 2024-03-19 MED ORDER — ORAL CARE MOUTH RINSE: 15.0000 mL | Freq: Once | OROMUCOSAL | Status: AC

## 2024-03-19 MED ORDER — CHLORHEXIDINE GLUCONATE 4 % EX SOLN
1.0000 | CUTANEOUS | 1 refills | Status: AC
Start: 2024-03-19 — End: ?

## 2024-03-19 MED ORDER — ACETAMINOPHEN 325 MG PO TABS
325.0000 mg | ORAL_TABLET | Freq: Four times a day (QID) | ORAL | Status: DC | PRN
  Administered 2024-03-21 – 2024-03-22 (×3): 650 mg via ORAL
  Filled 2024-03-19 (×3): qty 2

## 2024-03-19 MED ORDER — CEFAZOLIN SODIUM-DEXTROSE 3-4 GM/150ML-% IV SOLN
3.0000 g | INTRAVENOUS | Status: AC
  Administered 2024-03-19: 3 g via INTRAVENOUS
  Filled 2024-03-19: qty 150

## 2024-03-19 MED ORDER — DEXAMETHASONE SODIUM PHOSPHATE 10 MG/ML IJ SOLN
INTRAMUSCULAR | Status: DC | PRN
  Administered 2024-03-19: 6 mg via INTRAVENOUS

## 2024-03-19 MED ORDER — SODIUM CHLORIDE 0.9 % IR SOLN
Status: DC | PRN
  Administered 2024-03-19: 1000 mL

## 2024-03-19 MED ORDER — KETOROLAC TROMETHAMINE 15 MG/ML IJ SOLN
7.5000 mg | Freq: Four times a day (QID) | INTRAMUSCULAR | Status: AC
  Administered 2024-03-19 – 2024-03-20 (×3): 7.5 mg via INTRAVENOUS
  Filled 2024-03-19 (×3): qty 1

## 2024-03-19 MED ORDER — BUPIVACAINE IN DEXTROSE 0.75-8.25 % IT SOLN
INTRATHECAL | Status: DC | PRN
Start: 2024-03-19 — End: 2024-03-19
  Administered 2024-03-19: 2 mL via INTRATHECAL

## 2024-03-19 MED ORDER — MENTHOL 3 MG MT LOZG: 1.0000 | LOZENGE | OROMUCOSAL | Status: DC | PRN

## 2024-03-19 MED ORDER — TIZANIDINE HCL 4 MG PO TABS: 4.0000 mg | ORAL_TABLET | Freq: Four times a day (QID) | ORAL | Status: DC | PRN

## 2024-03-19 MED ORDER — FENTANYL CITRATE PF 50 MCG/ML IJ SOSY
50.0000 ug | PREFILLED_SYRINGE | INTRAMUSCULAR | Status: DC
  Administered 2024-03-19: 50 ug via INTRAVENOUS
  Filled 2024-03-19: qty 2

## 2024-03-19 MED ORDER — HYDROMORPHONE HCL 2 MG PO TABS
2.0000 mg | ORAL_TABLET | ORAL | Status: DC | PRN
  Administered 2024-03-20 (×2): 2 mg via ORAL
  Administered 2024-03-21: 3 mg via ORAL
  Administered 2024-03-21 – 2024-03-23 (×3): 2 mg via ORAL
  Filled 2024-03-19: qty 1
  Filled 2024-03-19: qty 2
  Filled 2024-03-19: qty 1
  Filled 2024-03-19: qty 2
  Filled 2024-03-19: qty 1
  Filled 2024-03-19: qty 2
  Filled 2024-03-19: qty 1
  Filled 2024-03-19: qty 2
  Filled 2024-03-19: qty 1

## 2024-03-19 MED ORDER — EPHEDRINE 5 MG/ML INJ
INTRAVENOUS | Status: AC
  Filled 2024-03-19: qty 5

## 2024-03-19 MED ORDER — MIDAZOLAM HCL 2 MG/2ML IJ SOLN
1.0000 mg | INTRAMUSCULAR | Status: DC
  Administered 2024-03-19: 1 mg via INTRAVENOUS
  Filled 2024-03-19: qty 2

## 2024-03-19 MED ORDER — MECLIZINE HCL 25 MG PO TABS
25.0000 mg | ORAL_TABLET | Freq: Three times a day (TID) | ORAL | Status: DC | PRN
Start: 2024-03-19 — End: 2024-03-22

## 2024-03-19 MED ORDER — PHENYLEPHRINE HCL-NACL 20-0.9 MG/250ML-% IV SOLN
INTRAVENOUS | Status: DC | PRN
  Administered 2024-03-19: 25 ug/min via INTRAVENOUS

## 2024-03-19 MED ORDER — LOSARTAN POTASSIUM-HCTZ 100-25 MG PO TABS: 1.0000 | ORAL_TABLET | Freq: Every day | ORAL | Status: DC

## 2024-03-19 MED ORDER — OXYCODONE HCL 5 MG PO TABS
5.0000 mg | ORAL_TABLET | ORAL | Status: DC | PRN
  Administered 2024-03-19 – 2024-03-23 (×18): 10 mg via ORAL
  Filled 2024-03-19 (×19): qty 2

## 2024-03-19 MED ORDER — LACTATED RINGERS IV SOLN: INTRAVENOUS | Status: DC

## 2024-03-19 MED ORDER — DROPERIDOL 2.5 MG/ML IJ SOLN: 0.6250 mg | Freq: Once | INTRAMUSCULAR | Status: DC | PRN

## 2024-03-19 MED ORDER — SODIUM CHLORIDE 0.9 % IV SOLN: INTRAVENOUS | Status: AC

## 2024-03-19 MED ORDER — PROPOFOL 500 MG/50ML IV EMUL
INTRAVENOUS | Status: AC
  Filled 2024-03-19: qty 50

## 2024-03-19 MED ORDER — HYDROCHLOROTHIAZIDE 25 MG PO TABS
25.0000 mg | ORAL_TABLET | Freq: Every day | ORAL | Status: DC
  Administered 2024-03-19 – 2024-03-21 (×3): 25 mg via ORAL
  Filled 2024-03-19 (×3): qty 1

## 2024-03-19 MED ORDER — ONDANSETRON HCL 4 MG/2ML IJ SOLN
INTRAMUSCULAR | Status: DC | PRN
  Administered 2024-03-19: 4 mg via INTRAVENOUS

## 2024-03-19 MED ORDER — ALUM & MAG HYDROXIDE-SIMETH 200-200-20 MG/5ML PO SUSP: 30.0000 mL | ORAL | Status: DC | PRN

## 2024-03-19 MED ORDER — TRANEXAMIC ACID-NACL 1000-0.7 MG/100ML-% IV SOLN
1000.0000 mg | INTRAVENOUS | Status: AC
  Administered 2024-03-19: 1000 mg via INTRAVENOUS
  Filled 2024-03-19: qty 100

## 2024-03-19 MED ORDER — BUPIVACAINE-EPINEPHRINE 0.25% -1:200000 IJ SOLN
INTRAMUSCULAR | Status: DC | PRN
  Administered 2024-03-19: 30 mL

## 2024-03-19 MED ORDER — ALBUTEROL SULFATE (2.5 MG/3ML) 0.083% IN NEBU
2.5000 mg | INHALATION_SOLUTION | Freq: Four times a day (QID) | RESPIRATORY_TRACT | Status: DC | PRN
  Administered 2024-03-21: 2.5 mg via RESPIRATORY_TRACT
  Filled 2024-03-19: qty 3

## 2024-03-19 MED ORDER — EPHEDRINE SULFATE-NACL 50-0.9 MG/10ML-% IV SOSY
PREFILLED_SYRINGE | INTRAVENOUS | Status: DC | PRN
  Administered 2024-03-19: 5 mg via INTRAVENOUS

## 2024-03-19 MED ORDER — DIPHENHYDRAMINE HCL 12.5 MG/5ML PO ELIX
12.5000 mg | ORAL_SOLUTION | ORAL | Status: DC | PRN
  Administered 2024-03-19 – 2024-03-21 (×4): 25 mg via ORAL
  Filled 2024-03-19 (×5): qty 10

## 2024-03-19 MED ORDER — METOCLOPRAMIDE HCL 5 MG/ML IJ SOLN: 5.0000 mg | Freq: Three times a day (TID) | INTRAMUSCULAR | Status: DC | PRN

## 2024-03-19 MED ORDER — ALBUTEROL SULFATE HFA 108 (90 BASE) MCG/ACT IN AERS: 2.0000 | INHALATION_SPRAY | Freq: Four times a day (QID) | RESPIRATORY_TRACT | Status: DC | PRN

## 2024-03-19 MED ORDER — ASPIRIN 81 MG PO CHEW
81.0000 mg | CHEWABLE_TABLET | Freq: Two times a day (BID) | ORAL | Status: DC
  Administered 2024-03-19 – 2024-03-23 (×8): 81 mg via ORAL
  Filled 2024-03-19 (×8): qty 1

## 2024-03-19 MED ORDER — ROPIVACAINE HCL 5 MG/ML IJ SOLN
INTRAMUSCULAR | Status: DC | PRN
  Administered 2024-03-19: 30 mL via PERINEURAL

## 2024-03-19 MED ORDER — ALPRAZOLAM 0.5 MG PO TABS
1.0000 mg | ORAL_TABLET | Freq: Every day | ORAL | Status: DC | PRN
  Administered 2024-03-19 – 2024-03-21 (×3): 1 mg via ORAL
  Filled 2024-03-19 (×3): qty 2

## 2024-03-19 MED ORDER — ATENOLOL 50 MG PO TABS
50.0000 mg | ORAL_TABLET | Freq: Every day | ORAL | Status: DC
  Administered 2024-03-20 – 2024-03-23 (×3): 50 mg via ORAL
  Filled 2024-03-19 (×4): qty 1

## 2024-03-19 SURGICAL SUPPLY — 58 items
BAG COUNTER SPONGE SURGICOUNT (BAG) IMPLANT
BAG ZIPLOCK 12X15 (MISCELLANEOUS) ×2 IMPLANT
BENZOIN TINCTURE PRP APPL 2/3 (GAUZE/BANDAGES/DRESSINGS) IMPLANT
BLADE SAG 18X100X1.27 (BLADE) ×2 IMPLANT
BLADE SURG SZ10 CARB STEEL (BLADE) IMPLANT
BNDG ELASTIC 6INX 5YD STR LF (GAUZE/BANDAGES/DRESSINGS) ×4 IMPLANT
BOWL SMART MIX CTS (DISPOSABLE) IMPLANT
CEMENT BONE R 1X40 (Cement) IMPLANT
COMP FEM CMT PERSONA SZ9 RT (Joint) ×1 IMPLANT
COMP TIB CMT PS KNEE F 0D RT (Joint) ×1 IMPLANT
COMPONENT FEM CMT PRSONA SZ9RT (Joint) IMPLANT
COMPONENT TIB CMT PS KN F 0DRT (Joint) IMPLANT
COOLER ICEMAN CLASSIC (MISCELLANEOUS) ×2 IMPLANT
COVER SURGICAL LIGHT HANDLE (MISCELLANEOUS) ×2 IMPLANT
CUFF TOURN SGL QUICK 42 (TOURNIQUET CUFF) IMPLANT
CUFF TRNQT CYL 34X4.125X (TOURNIQUET CUFF) ×2 IMPLANT
DRAPE INCISE IOBAN 66X45 STRL (DRAPES) ×2 IMPLANT
DRAPE U-SHAPE 47X51 STRL (DRAPES) ×2 IMPLANT
DURAPREP 26ML APPLICATOR (WOUND CARE) ×2 IMPLANT
ELECT BLADE TIP CTD 4 INCH (ELECTRODE) ×2 IMPLANT
ELECT PENCIL ROCKER SW 15FT (MISCELLANEOUS) ×2 IMPLANT
ELECT REM PT RETURN 15FT ADLT (MISCELLANEOUS) ×2 IMPLANT
GAUZE PAD ABD 8X10 STRL (GAUZE/BANDAGES/DRESSINGS) ×4 IMPLANT
GAUZE SPONGE 4X4 12PLY STRL (GAUZE/BANDAGES/DRESSINGS) ×2 IMPLANT
GAUZE XEROFORM 1X8 LF (GAUZE/BANDAGES/DRESSINGS) IMPLANT
GLOVE BIO SURGEON STRL SZ7.5 (GLOVE) ×2 IMPLANT
GLOVE BIOGEL PI IND STRL 8 (GLOVE) ×4 IMPLANT
GLOVE ECLIPSE 8.0 STRL XLNG CF (GLOVE) ×2 IMPLANT
GOWN STRL REUS W/ TWL XL LVL3 (GOWN DISPOSABLE) ×4 IMPLANT
HOLDER FOLEY CATH W/STRAP (MISCELLANEOUS) IMPLANT
IMMOBILIZER KNEE 20 (SOFTGOODS) ×1 IMPLANT
IMMOBILIZER KNEE 20 THIGH 36 (SOFTGOODS) ×2 IMPLANT
INSERT TIB ASF SZ8-11 14 RT (Insert) IMPLANT
KIT TURNOVER KIT A (KITS) IMPLANT
LINER TIB PS KNEE EF/8-11 18 R (Liner) IMPLANT
MANIFOLD NEPTUNE II (INSTRUMENTS) ×2 IMPLANT
NS IRRIG 1000ML POUR BTL (IV SOLUTION) ×2 IMPLANT
PACK TOTAL KNEE CUSTOM (KITS) ×2 IMPLANT
PAD COLD SHLDR WRAP-ON (PAD) ×2 IMPLANT
PADDING CAST COTTON 6X4 STRL (CAST SUPPLIES) ×4 IMPLANT
PIN DRILL HDLS TROCAR 75 4PK (PIN) IMPLANT
PROTECTOR NERVE ULNAR (MISCELLANEOUS) ×2 IMPLANT
SCREW FEMALE HEX FIX 25X2.5 (ORTHOPEDIC DISPOSABLE SUPPLIES) IMPLANT
SET HNDPC FAN SPRY TIP SCT (DISPOSABLE) ×2 IMPLANT
SET PAD KNEE POSITIONER (MISCELLANEOUS) ×2 IMPLANT
SPIKE FLUID TRANSFER (MISCELLANEOUS) IMPLANT
STAPLER SKIN PROX WIDE 3.9 (STAPLE) IMPLANT
STEM POLY PAT PLY 32M KNEE (Knees) IMPLANT
STRIP CLOSURE SKIN 1/2X4 (GAUZE/BANDAGES/DRESSINGS) IMPLANT
SUT MNCRL AB 4-0 PS2 18 (SUTURE) IMPLANT
SUT VIC AB 0 CT1 36 (SUTURE) ×2 IMPLANT
SUT VIC AB 1 CT1 36 (SUTURE) ×4 IMPLANT
SUT VIC AB 2-0 CT1 TAPERPNT 27 (SUTURE) ×4 IMPLANT
TOWEL GREEN STERILE FF (TOWEL DISPOSABLE) ×2 IMPLANT
TRAY FOLEY MTR SLVR 14FR STAT (SET/KITS/TRAYS/PACK) IMPLANT
TRAY FOLEY MTR SLVR 16FR STAT (SET/KITS/TRAYS/PACK) IMPLANT
WATER STERILE IRR 1000ML POUR (IV SOLUTION) ×4 IMPLANT
YANKAUER SUCT BULB TIP NO VENT (SUCTIONS) ×2 IMPLANT

## 2024-03-19 NOTE — Anesthesia Procedure Notes (Signed)
 Anesthesia Regional Block: Adductor canal block   Pre-Anesthetic Checklist: , timeout performed,  Correct Patient, Correct Site, Correct Laterality,  Correct Procedure, Correct Position, site marked,  Risks and benefits discussed,  Surgical consent,  Pre-op evaluation,  At surgeon's request and post-op pain management  Laterality: Right  Prep: chloraprep       Needles:  Injection technique: Single-shot  Needle Type: Echogenic Stimulator Needle     Needle Length: 9cm  Needle Gauge: 21     Additional Needles:   Procedures:,,,, ultrasound used (permanent image in chart),,    Narrative:  Start time: 03/19/2024 10:45 AM End time: 03/19/2024 10:50 AM Injection made incrementally with aspirations every 5 mL.  Performed by: Personally  Anesthesiologist: Shelton Silvas, MD  Additional Notes: Discussed risks and benefits of the nerve block in detail, including but not limited vascular injury, permanent nerve damage and infection.   Patient tolerated the procedure well. Local anesthetic introduced in an incremental fashion under minimal resistance after negative aspirations. No paresthesias were elicited. After completion of the procedure, no acute issues were identified and patient continued to be monitored by RN.

## 2024-03-19 NOTE — Anesthesia Postprocedure Evaluation (Signed)
 Anesthesia Post Note  Patient: [name]  Procedure(s) Performed: ARTHROPLASTY, KNEE, TOTAL (Right: Knee)     Patient location during evaluation: PACU Anesthesia Type: Spinal Level of consciousness: oriented and awake and alert Pain management: pain level controlled Vital Signs Assessment: post-procedure vital signs reviewed and stable Respiratory status: spontaneous breathing, respiratory function stable and patient connected to nasal cannula oxygen Cardiovascular status: blood pressure returned to baseline and stable Postop Assessment: no headache, no backache and no apparent nausea or vomiting Anesthetic complications: no  No notable events documented.  Last Vitals:  Vitals:   03/19/24 1515 03/19/24 1545  BP: 119/78 138/82  Pulse: (!) 58 64  Resp: 15 16  Temp:  (!) 36.4 C  SpO2: 100% 100%    Last Pain:  Vitals:   03/19/24 1545  TempSrc: Oral  PainSc: 0-No pain                 Shelton Silvas

## 2024-03-19 NOTE — Plan of Care (Signed)
   Problem: Coping: Goal: Level of anxiety will decrease Outcome: Progressing   Problem: Pain Managment: Goal: General experience of comfort will improve and/or be controlled Outcome: Progressing   Problem: Safety: Goal: Ability to remain free from injury will improve Outcome: Progressing

## 2024-03-19 NOTE — Transfer of Care (Signed)
 Immediate Anesthesia Transfer of Care Note  Patient: [name]  Procedure(s) Performed: ARTHROPLASTY, KNEE, TOTAL (Right: Knee)  Patient Location: PACU  Anesthesia Type:MAC and Spinal  Level of Consciousness: awake, alert , oriented, and patient cooperative  Airway & Oxygen Therapy: Patient Spontanous Breathing and Patient connected to face mask oxygen  Post-op Assessment: Report given to RN and Post -op Vital signs reviewed and stable  Post vital signs: Reviewed and stable  Last Vitals:  Vitals Value Taken Time  BP 82/56 03/19/24 1350  Temp    Pulse 58 03/19/24 1351  Resp 12 03/19/24 1351  SpO2 100 % 03/19/24 1351  Vitals shown include unfiled device data.  Last Pain:  Vitals:   03/19/24 1050  TempSrc:   PainSc: 0-No pain         Complications: No notable events documented.

## 2024-03-19 NOTE — Anesthesia Procedure Notes (Signed)
 Procedure Name: MAC Date/Time: 03/19/2024 11:15 AM  Performed by: Elisabeth Cara, CRNAPre-anesthesia Checklist: Patient identified, Emergency Drugs available, Suction available, Patient being monitored and Timeout performed Oxygen Delivery Method: Simple face mask Placement Confirmation: positive ETCO2 Dental Injury: Teeth and Oropharynx as per pre-operative assessment

## 2024-03-19 NOTE — Op Note (Signed)
 Operative Note  Date of operation: 03/19/2024 Preoperative diagnosis: Right knee primary osteoarthritis Postoperative diagnosis: Same  Procedure: Right cemented total knee arthroplasty  Implants: Biomet/Zimmer persona knee system Implant Name Type Inv. Item Serial No. Manufacturer Lot No. LRB No. Used Action  CEMENT BONE R 1X40 - [id number] Cement CEMENT BONE R 1X40  ZIMMER RECON(ORTH,TRAU,BIO,SG) [id number] Right 2 Implanted  COMP FEM CMT PERSONA SZ9 RT - [id number] Joint COMP FEM CMT PERSONA SZ9 RT  ZIMMER RECON(ORTH,TRAU,BIO,SG) [id number] Right 1 Implanted  COMP TIB CMT PS KNEE F 0D RT - [id number] Joint COMP TIB CMT PS KNEE F 0D RT  ZIMMER RECON(ORTH,TRAU,BIO,SG) [id number] Right 1 Implanted  INSERT TIB ASF SZ8-11 14 RT - [id number] Insert INSERT TIB ASF SZ8-11 14 RT  ZIMMER RECON(ORTH,TRAU,BIO,SG) [id number] Right 1 Implanted  LINER TIB PS KNEE EF/8-11 18 R - [id number] Liner LINER TIB PS KNEE EF/8-11 18 R  ZIMMER RECON(ORTH,TRAU,BIO,SG) [id number] Right 1 Implanted   Surgeon: Vanita Panda. Magnus Ivan, MD Assistant: Rexene Edison, PA-C  Anesthesia: #1 right lower extremity adductor canal block, #2 spinal, #3 local Tourniquet time: Less than an hour and a half EBL: Less than 50 cc Antibiotics: 3 g IV Ancef Complications: None  Indications: The patient is a 63 year old female well-known to me.  We actually replaced her left knee back in 2018.  She has developed debilitating arthritis in her right knee and does wish proceed with a knee replacement given the failure conservative treatment for over a year.  She is however morbidly obese with a BMI of 45.  She knows a surgical be quite difficult but she has been on a weight loss journey and lost 60 pounds.  Given her morbid obesity she knows there is a heightened risks of acute blood loss anemia, nerve or vessel injury, fracture, infection, DVT, implant failure and wound healing issues.  She understands there is definitely high risk of implant failure  given her morbid obesity.  She also understands that our goals are decreased pain, improved mobility and improved quality of life.  Procedure description: After informed consent was obtained and the appropriate right knee was marked, anesthesia obtained a right lower extremity adductor canal block in the holding room.  The patient was then brought to the operating room and set up on the operating table spinal anesthesia was obtained.  She was then laid in supine position on the operating table and a Foley catheter is placed.  A nonsterile tourniquet was placed around her upper right thigh and her right thigh, knee, leg and ankle were prepped and draped with DuraPrep and sterile drapes including a sterile stockinette.  A timeout was called and she was identified as the correct patient and the correct right knee.  An Esmarch was used to wrap out the leg and the tourniquet was inflated to 300 mm of pressure.  With the knee extended a direct midline incision was made over the patella and carried proximally distally.  Dissection was carried down to the knee joint and a medial parapatellar arthrotomy was made finding a large joint effusion.  With the knee in a flexed position we found significant cartilage loss throughout the knee.  We removed remnants of the ACL as well as medial lateral meniscus and osteophytes in all 3 compartments.  We then used extramedullary cutting guide for making her proximal tibia cut.  We made this cut to take 2 mm off the low side correction for varus and valgus and a 7 degree slope.  We ended up taking  2 more millimeters.  We then went to the femur and used an intramedullary based cutting guide for distal femur cut setting this for a right femur at 5 degrees actually rotated for a 10 mm distal femoral cut.  We then brought the knee back down to full extension and achieve full extension with a 12 mm extension block.  We then back to the femur and put a femoral sizing guide based off the  epicondylar axis.  Based off of this we chose a size 9 femur.  We put a 4-in-1 cutting block for a size 9 femur and made our anterior and posterior cuts followed by her chamfer cuts.  We then low back down to the tibia and chose a size F tibial tray for coverage over the tibial plateau.  We said the rotation of the tibial tubercle and the femur.  We then did our drill hole and keel punch off of this.  We then trialed our size F tibia followed by our size 9 right CR femur.  We went up to a 14 mm very congruent polythene insert we are pleased with range of motion and stability that insert.  This was quite difficult though given her large thigh.  We then made a patella cut and drilled 3 holes for a size 32 patella button.  We are pleased with stability at that moment.  We then removed all transportation with the knee and irrigate the knee and normal saline solution.  We placed Marcaine with epinephrine around the arthrotomy and next mixed our cement.  We then cemented our Biomet/Zimmer persona tibial tray for right knee size F followed by cementing our size 9 CR right standard femur.  We placed our 14 mm medial congruent polythene insert and cemented our patella button.  We held the knee fully extended and compressed while the cemented hardened.  I then assessed the range of motion again and did not like the instability of her knee with varus and valgus stressing.  At that point we decided to go up to a size 18 mm thickness insert and we are able to remove the 14 mm insert and place an 18 mm medial congruent right polythene insert and we are definitely pleased with range of motion and stability insert.  She definitely needs this given her significant obesity.  We then let the tourniquet down and hemostasis was obtained electrocautery.  The arthrotomy was closed with interrupted #1 Vicryl suture followed by 0 Vicryl goes deep tissue and 2-0 Vicryl to close subcutaneous tissue.  The skin was closed with staples.   Well-padded sterile dressings applied.  The patient was taken to the recovery room.  Rexene Edison, PA-C did assist during the entire case and beginning and his assistance was crucial and medically necessary for soft tissue management and retraction, helping guide implant placement and a layered closure of the wound.

## 2024-03-19 NOTE — Anesthesia Preprocedure Evaluation (Addendum)
 Anesthesia Evaluation  Patient identified by MRN, date of birth, ID band Patient awake    Reviewed: Allergy & Precautions, NPO status , Patient's Chart, lab work & pertinent test results  Airway Mallampati: III       Dental no notable dental hx.    Pulmonary asthma , sleep apnea    Pulmonary exam normal        Cardiovascular hypertension, Pt. on home beta blockers and Pt. on medications Normal cardiovascular exam     Neuro/Psych  Headaches  Anxiety      Neuromuscular disease    GI/Hepatic Neg liver ROS,GERD  ,,  Endo/Other  negative endocrine ROS    Renal/GU negative Renal ROS     Musculoskeletal  (+) Arthritis ,  Fibromyalgia -  Abdominal   Peds  Hematology negative hematology ROS (+)   Anesthesia Other Findings   Reproductive/Obstetrics                             Anesthesia Physical Anesthesia Plan  ASA: 3  Anesthesia Plan: Spinal   Post-op Pain Management: Regional block*   Induction: Intravenous  PONV Risk Score and Plan: 3 and Ondansetron, Dexamethasone, Midazolam and Propofol infusion  Airway Management Planned: Natural Airway and Nasal Cannula  Additional Equipment: None  Intra-op Plan:   Post-operative Plan:   Informed Consent: I have reviewed the patients History and Physical, chart, labs and discussed the procedure including the risks, benefits and alternatives for the proposed anesthesia with the patient or authorized representative who has indicated his/her understanding and acceptance.       Plan Discussed with: CRNA  Anesthesia Plan Comments: (Lab Results      Component                Value               Date                      WBC                      4.8                 03/15/2024                HGB                      12.7                03/15/2024                HCT                      38.8                03/15/2024                MCV                       94.4                03/15/2024                PLT                      371  03/15/2024           )       Anesthesia Quick Evaluation

## 2024-03-19 NOTE — Anesthesia Procedure Notes (Signed)
 Spinal  Start time: 03/19/2024 11:22 AM End time: 03/19/2024 11:25 AM Reason for block: surgical anesthesia Staffing Performed: anesthesiologist  Anesthesiologist: Shelton Silvas, MD Performed by: Shelton Silvas, MD Authorized by: Shelton Silvas, MD   Preanesthetic Checklist Completed: patient identified, IV checked, site marked, risks and benefits discussed, surgical consent, monitors and equipment checked, pre-op evaluation and timeout performed Spinal Block Patient position: sitting Prep: DuraPrep and site prepped and draped Location: L3-4 Injection technique: single-shot Needle Needle type: Pencan  Needle gauge: 24 G Needle length: 10 cm Needle insertion depth: 10 cm Assessment Events: CSF return Additional Notes Patient tolerated well. No immediate complications.  Functioning IV was confirmed and monitors were applied. Sterile prep and drape, including hand hygiene and sterile gloves were used. The patient was positioned and the back was prepped. The skin was anesthetized with lidocaine. Free flow of clear CSF was obtained prior to injecting local anesthetic into the CSF. The spinal needle aspirated freely following injection. The needle was carefully withdrawn. The patient tolerated the procedure well.

## 2024-03-19 NOTE — Evaluation (Signed)
 Physical Therapy Evaluation Patient Details Name: [name] MRN: [id number] DOB: 1961/03/12 Today's Date: 03/19/2024  History of Present Illness  Pt is 63 yo female s/p R TKA on 03/19/24.  Pt with hx including but not limited to L TKA 2018 , HTN, HCL, back pain, DDD, fibromyalgia, RA, ACDF  Clinical Impression  Pt is s/p TKA resulting in the deficits listed below (see PT Problem List). At baseline, pt was limited ambulator using rollator.  She lives with family and has an aide that assist with ADLs.  Today, pt still with effects of adductor canal block that limited therapy, but agreeable to sitting EOB.  Required min A for R LE to EOB.  Unable to progress to standing as pt does not have strength in L LE and upper body to safely compensate for R LE even with KI in place.  Pt steady at EOB and wanting to sit to eat.  Daughter is present.  Pt expected to progress with therapy but suspect may need increased time. Noting plan is for home with HHPT. Pt will benefit from acute skilled PT to increase their independence and safety with mobility to allow discharge.          If plan is discharge home, recommend the following: Two people to help with walking and/or transfers;Two people to help with bathing/dressing/bathroom   Can travel by private vehicle        Equipment Recommendations BSC/3in1;Other (comment) (shower chair)  Recommendations for Other Services       Functional Status Assessment Patient has had a recent decline in their functional status and demonstrates the ability to make significant improvements in function in a reasonable and predictable amount of time.     Precautions / Restrictions Precautions Precautions: Fall;Knee Required Braces or Orthoses: Knee Immobilizer - Right Knee Immobilizer - Right: Discontinue once straight leg raise with < 10 degree lag Restrictions Weight Bearing Restrictions Per Provider Order: Yes RLE Weight Bearing Per Provider Order: Weight bearing as  tolerated      Mobility  Bed Mobility Overal bed mobility: Needs Assistance Bed Mobility: Supine to Sit     Supine to sit: Min assist     General bed mobility comments: Assist for R LE    Transfers                   General transfer comment: deferred due to R LE decreased sensation with no active DF or quad contraction (pt not able to compensate with upper body strength)    Ambulation/Gait                  Stairs            Wheelchair Mobility     Tilt Bed    Modified Rankin (Stroke Patients Only)       Balance Overall balance assessment: Needs assistance Sitting-balance support: No upper extremity supported Sitting balance-Leahy Scale: Good                                       Pertinent Vitals/Pain Pain Assessment Pain Assessment: Faces Faces Pain Scale: Hurts little more Pain Location: R knee Pain Descriptors / Indicators: Discomfort Pain Intervention(s): Limited activity within patient's tolerance, Monitored during session, Premedicated before session, Repositioned    Home Living Family/patient expects to be discharged to:: Private residence Living Arrangements: Children (adult son) Available Help at Discharge: Family;Available  24 hours/day Type of Home: Apartment Home Access: Stairs to enter   Entrance Stairs-Number of Steps: step up on curb   Home Layout: One level Home Equipment: Cane - single point;Rollator (4 wheels)      Prior Function Prior Level of Function : Independent/Modified Independent             Mobility Comments: ambulated with rollator - only short community distance ADLs Comments: Pt had aide 5 days that assisted with bathing and dressing.     Extremity/Trunk Assessment   Upper Extremity Assessment Upper Extremity Assessment: Overall WFL for tasks assessed    Lower Extremity Assessment Lower Extremity Assessment: LLE deficits/detail;RLE deficits/detail RLE Deficits / Details:  Expected post op changes; Still with effects of nerve block with no quad contraction or active dorsiflexion, could move toes; utilized KI RLE Sensation: decreased light touch LLE Deficits / Details: ROM WFL; MMT 5/5 ankle DF, 5/5 knee ext, 3/5 hip    Cervical / Trunk Assessment Cervical / Trunk Assessment: Normal  Communication        Cognition Arousal: Alert Behavior During Therapy: WFL for tasks assessed/performed   PT - Cognitive impairments: No apparent impairments                                 Cueing       General Comments      Exercises     Assessment/Plan    PT Assessment Patient needs continued PT services  PT Problem List Decreased strength;Pain;Decreased range of motion;Decreased cognition;Decreased activity tolerance;Decreased knowledge of use of DME;Decreased balance;Decreased mobility       PT Treatment Interventions Therapeutic exercise;DME instruction;Gait training;Balance training;Stair training;Functional mobility training;Therapeutic activities;Patient/family education;Modalities    PT Goals (Current goals can be found in the Care Plan section)  Acute Rehab PT Goals Patient Stated Goal: return home PT Goal Formulation: With patient/family Time For Goal Achievement: 04/02/24 Potential to Achieve Goals: Good    Frequency 7X/week     Co-evaluation               AM-PAC PT "6 Clicks" Mobility  Outcome Measure Help needed turning from your back to your side while in a flat bed without using bedrails?: A Little Help needed moving from lying on your back to sitting on the side of a flat bed without using bedrails?: A Little Help needed moving to and from a bed to a chair (including a wheelchair)?: Total Help needed standing up from a chair using your arms (e.g., wheelchair or bedside chair)?: Total Help needed to walk in hospital room?: Total Help needed climbing 3-5 steps with a railing? : Total 6 Click Score: 10    End of  Session Equipment Utilized During Treatment: Right knee immobilizer Activity Tolerance: Patient tolerated treatment well Patient left: in bed;with call bell/phone within reach;with family/visitor present Nurse Communication: Mobility status (sitting EOB, pulse ox turned off so that she can eat) PT Visit Diagnosis: Other abnormalities of gait and mobility (R26.89);Muscle weakness (generalized) (M62.81)    Time: 1610-9604 PT Time Calculation (min) (ACUTE ONLY): 20 min   Charges:   PT Evaluation $PT Eval Low Complexity: 1 Low   PT General Charges $$ ACUTE PT VISIT: 1 Visit         Anise Salvo, PT Acute Rehab [facility] [phone]   Rayetta Humphrey 03/19/2024, 5:51 PM

## 2024-03-19 NOTE — Interval H&P Note (Signed)
 History and Physical Interval Note: The patient understands that she is here today for a right total knee replacement to treat her severe right knee pain and arthritis.  There has been no acute or interval change in her medical status.  The risks and benefits of surgery have been discussed in detail and informed consent has been obtained.  The right operative knee has been marked.  03/19/2024 10:11 AM  [name]  has presented today for surgery, with the diagnosis of right knee osteoarthritis.  The various methods of treatment have been discussed with the patient and family. After consideration of risks, benefits and other options for treatment, the patient has consented to  Procedure(s): ARTHROPLASTY, KNEE, TOTAL (Right) as a surgical intervention.  The patient's history has been reviewed, patient examined, no change in status, stable for surgery.  I have reviewed the patient's chart and labs.  Questions were answered to the patient's satisfaction.     [name]

## 2024-03-20 DIAGNOSIS — M1711 Unilateral primary osteoarthritis, right knee: Secondary | ICD-10-CM | POA: Diagnosis not present

## 2024-03-20 LAB — CBC
HCT: 34.7 % — ABNORMAL LOW (ref 36.0–46.0)
Hemoglobin: 11.1 g/dL — ABNORMAL LOW (ref 12.0–15.0)
MCH: 31 pg (ref 26.0–34.0)
MCHC: 32 g/dL (ref 30.0–36.0)
MCV: 96.9 fL (ref 80.0–100.0)
Platelets: 323 10*3/uL (ref 150–400)
RBC: 3.58 MIL/uL — ABNORMAL LOW (ref 3.87–5.11)
RDW: 13 % (ref 11.5–15.5)
WBC: 10.9 10*3/uL — ABNORMAL HIGH (ref 4.0–10.5)
nRBC: 0 % (ref 0.0–0.2)

## 2024-03-20 LAB — BASIC METABOLIC PANEL WITH GFR
Anion gap: 6 (ref 5–15)
BUN: 23 mg/dL (ref 8–23)
CO2: 24 mmol/L (ref 22–32)
Calcium: 8.9 mg/dL (ref 8.9–10.3)
Chloride: 106 mmol/L (ref 98–111)
Creatinine, Ser: 0.74 mg/dL (ref 0.44–1.00)
GFR, Estimated: >60 mL/min (ref >60)
Glucose, Bld: 163 mg/dL — ABNORMAL HIGH (ref 70–99)
Potassium: 3.9 mmol/L (ref 3.5–5.1)
Sodium: 136 mmol/L (ref 135–145)

## 2024-03-20 NOTE — Progress Notes (Signed)
 Subjective: 1 Day Post-Op Procedure(s) (LRB): ARTHROPLASTY, KNEE, TOTAL (Right) Patient reports pain as moderate.  She has been up with therapy this morning.  Therapy did let me know that she has foot drop on that right operative side.  Surgery was very difficult yesterday.  Her BMI is 46 and her thighs very large.  The case was certainly difficult given her obesity and this likely represents stretch on the peroneal nerves from retractors.  Objective: Vital signs in last 24 hours: Temp:  [97.4 F (36.3 C)-98.3 F (36.8 C)] 98.3 F (36.8 C) (04/05 0949) Pulse Rate:  [52-72] 59 (04/05 0949) Resp:  [13-17] 16 (04/05 0949) BP: (60-150)/(32-83) 147/74 (04/05 0949) SpO2:  [95 %-100 %] 99 % (04/05 0949)  Intake/Output from previous day: 04/04 0701 - 04/05 0700 In: 3567 [P.O.:320; I.V.:2897; IV Piggyback:350] Out: 2401 [Urine:2350; Stool:1; Blood:50] Intake/Output this shift: Total I/O In: 240 [P.O.:240] Out: -   Recent Labs    03/20/24 0326  HGB 11.1*   Recent Labs    03/20/24 0326  WBC 10.9*  RBC 3.58*  HCT 34.7*  PLT 323   Recent Labs    03/19/24 1410 03/20/24 0326  NA 136 136  K 4.6 3.9  CL 103 106  CO2 26 24  BUN 19 23  CREATININE 0.79 0.74  GLUCOSE 108* 163*  CALCIUM 8.9 8.9   No results for input(s): "LABPT", "INR" in the last 72 hours.  Intact pulses distally Incision: scant drainage Compartment soft   Assessment/Plan: 1 Day Post-Op Procedure(s) (LRB): ARTHROPLASTY, KNEE, TOTAL (Right) Up with therapy I spoke to the patient about her right side foot drop and how that relates to her surgery.  This should recover with time but it can take 6 months to a year sometimes to slowly recover.  She will need an AFO on her right ankle and foot but we most likely will be able to get that until next week.  She will need a bedside commode and she is requesting to see if there is potential for having hospital bed at home if that is possible.  I told her I am not sure  but we can certainly have it looked into.     [name] 03/20/2024, 10:58 AM

## 2024-03-20 NOTE — Progress Notes (Signed)
 Physical Therapy Treatment Patient Details Name: [name] MRN: [id number] DOB: 02-20-1961 Today's Date: 03/20/2024   History of Present Illness Pt is 63 yo female s/p R TKA on 03/19/24.  Pt with hx including but not limited to L TKA 2018 , HTN, HCL, back pain, DDD, fibromyalgia, RA, ACDF    PT Comments  Pt agreeable with working with therapy. Family present during session. Pt was Min-Mod A for mobility on today. Required chair follow during ambulation. Pt also reported difficulty/inability with DF of R foot-appears to have some post-op weakness-made MD aware. Will continue to progress activity as tolerated. Pt has not yet met her PT goals.     If plan is discharge home, recommend the following: A lot of help with bathing/dressing/bathroom;Assistance with cooking/housework;Assist for transportation;Help with stairs or ramp for entrance;A little help with walking and/or transfers   Can travel by private vehicle        Equipment Recommendations  Hospital bed;BSC/3in1;Rolling walker (2 wheels); Shower chair    Recommendations for Other Services       Precautions / Restrictions Precautions Precautions: Fall;Knee Restrictions Weight Bearing Restrictions Per Provider Order: No RLE Weight Bearing Per Provider Order: Weight bearing as tolerated     Mobility  Bed Mobility Overal bed mobility: Needs Assistance Bed Mobility: Supine to Sit     Supine to sit: Min assist, HOB elevated, Used rails     General bed mobility comments: Assist for R LE. Increased time.    Transfers Overall transfer level: Needs assistance Equipment used: Rolling walker (2 wheels) Transfers: Sit to/from Stand Sit to Stand: Mod assist, From elevated surface           General transfer comment: Daughter assisted pt into standing by pulling on pt forward facing. Increased time. Cues for safety, technique, hand/LE placement. Fall risk.    Ambulation/Gait Ambulation/Gait assistance: Min assist, +2  safety/equipment Gait Distance (Feet): 28 Feet Assistive device: Rolling walker (2 wheels) Gait Pattern/deviations: Step-to pattern, Trunk flexed, Decreased dorsiflexion - right       General Gait Details: Cues for safety, sequencing, attention to proper clearance/advancement of R LE during gait 2* decreased dorsiflexion (pt with DF weakness). Followed with recliner and used it to transport pt back to bedside.   Stairs             Wheelchair Mobility     Tilt Bed    Modified Rankin (Stroke Patients Only)       Balance Overall balance assessment: Needs assistance Sitting-balance support: Feet supported Sitting balance-Leahy Scale: Fair     Standing balance support: Bilateral upper extremity supported, Reliant on assistive device for balance Standing balance-Leahy Scale: Poor                              Communication Communication Communication: No apparent difficulties  Cognition Arousal: Alert Behavior During Therapy: WFL for tasks assessed/performed   PT - Cognitive impairments: No apparent impairments                         Following commands: Intact      Cueing Cueing Techniques: Verbal cues  Exercises Total Joint Exercises Ankle Circles/Pumps: AROM, 10 reps, Left (AA on R side 2* DF weakness) Quad Sets: AROM, Right, 10 reps Heel Slides: AAROM, Right, 10 reps Straight Leg Raises: AAROM, Right, 10 reps Goniometric ROM: ~10-65 degrees (supine)    General Comments  Pertinent Vitals/Pain      Home Living                          Prior Function            PT Goals (current goals can now be found in the care plan section) Progress towards PT goals: Progressing toward goals    Frequency    7X/week      PT Plan      Co-evaluation              AM-PAC PT "6 Clicks" Mobility   Outcome Measure  Help needed turning from your back to your side while in a flat bed without using bedrails?: A  Little Help needed moving from lying on your back to sitting on the side of a flat bed without using bedrails?: A Little Help needed moving to and from a bed to a chair (including a wheelchair)?: A Lot Help needed standing up from a chair using your arms (e.g., wheelchair or bedside chair)?: A Lot Help needed to walk in hospital room?: A Lot Help needed climbing 3-5 steps with a railing? : Total 6 Click Score: 13    End of Session Equipment Utilized During Treatment: Gait belt Activity Tolerance: Patient tolerated treatment well;Patient limited by fatigue;Patient limited by pain Patient left: in chair;with call bell/phone within reach;with family/visitor present   PT Visit Diagnosis: Other abnormalities of gait and mobility (R26.89);Muscle weakness (generalized) (M62.81)     Time: 1000-1043 PT Time Calculation (min) (ACUTE ONLY): 43 min  Charges:    $Gait Training: 8-22 mins $Therapeutic Exercise: 8-22 mins $Therapeutic Activity: 8-22 mins PT General Charges $$ ACUTE PT VISIT: 1 Visit                         Faye Ramsay, PT Acute Rehabilitation  Office: [phone]

## 2024-03-20 NOTE — Progress Notes (Signed)
 Physical Therapy Treatment Patient Details Name: [name] MRN: [id number] DOB: February 24, 1961 Today's Date: 03/20/2024   History of Present Illness Pt is 63 yo female s/p R TKA on 03/19/24.  Pt with hx including but not limited to L TKA 2018 , HTN, HCL, back pain, DDD, fibromyalgia, RA, ACDF    PT Comments  Pt agreeable to 2nd session. Pt reports 9/10 pain during session. Mobility was limited by pain. +2 for mobility at times. Will continue to progress activity as able.     If plan is discharge home, recommend the following: A lot of help with bathing/dressing/bathroom;Assistance with cooking/housework;Assist for transportation;Help with stairs or ramp for entrance;A little help with walking and/or transfers   Can travel by private vehicle        Equipment Recommendations  Hospital bed;BSC/3in1;Rolling walker (2 wheels)    Recommendations for Other Services       Precautions / Restrictions Precautions Precautions: Fall;Knee Restrictions Weight Bearing Restrictions Per Provider Order: No RLE Weight Bearing Per Provider Order: Weight bearing as tolerated     Mobility  Bed Mobility Overal bed mobility: Needs Assistance Bed Mobility: Supine to Sit, Sit to Supine     Supine to sit: Min assist Sit to supine: Mod assist   General bed mobility comments: Assist for R LE. Increased time.    Transfers Overall transfer level: Needs assistance Equipment used: Rolling walker (2 wheels) Transfers: Sit to/from Stand Sit to Stand: Mod assist, Max assist           General transfer comment: Mod-Max A to rise from recliner/bed/bsc. Cues for safety, technique, hand/LE palcement. Pt tends to want to pull up on walker. Fall risk.    Ambulation/Gait Ambulation/Gait assistance: Min assist, +2 safety/equipment Gait Distance (Feet): 10 Feet Assistive device: Rolling walker (2 wheels) Gait Pattern/deviations: Step-to pattern, Trunk flexed, Decreased dorsiflexion - right        General Gait Details: Cues for safety, sequencing, attention to proper clearance/advancement of R LE during gait 2* decreased dorsiflexion (pt with DF weakness). Followed with recliner and used it to transport pt back to bedside. Distance limited by pain this afternoon.   Stairs             Wheelchair Mobility     Tilt Bed    Modified Rankin (Stroke Patients Only)       Balance Overall balance assessment: Needs assistance Sitting-balance support: Feet supported Sitting balance-Leahy Scale: Fair     Standing balance support: Bilateral upper extremity supported, Reliant on assistive device for balance Standing balance-Leahy Scale: Poor                              Communication Communication Communication: No apparent difficulties  Cognition Arousal: Alert Behavior During Therapy: WFL for tasks assessed/performed   PT - Cognitive impairments: No apparent impairments                         Following commands: Intact      Cueing Cueing Techniques: Verbal cues  Exercises Total Joint Exercises Ankle Circles/Pumps: AROM, 10 reps, Left (AA on R side 2* DF weakness) Quad Sets: AROM, Right, 10 reps Heel Slides: AAROM, Right, 10 reps Straight Leg Raises: AAROM, Right, 10 reps Goniometric ROM: ~10-65 degrees (supine)    General Comments        Pertinent Vitals/Pain Pain Assessment Pain Assessment: 0-10 Pain Score: 9  Pain Location: R  knee/thigh Pain Descriptors / Indicators: Grimacing, Operative site guarding, Aching Pain Intervention(s): Limited activity within patient's tolerance, Monitored during session, Repositioned, RN gave pain meds during session, Patient requesting pain meds-RN notified    Home Living                          Prior Function            PT Goals (current goals can now be found in the care plan section) Progress towards PT goals: Progressing toward goals    Frequency    7X/week      PT  Plan      Co-evaluation              AM-PAC PT "6 Clicks" Mobility   Outcome Measure  Help needed turning from your back to your side while in a flat bed without using bedrails?: A Little Help needed moving from lying on your back to sitting on the side of a flat bed without using bedrails?: A Little Help needed moving to and from a bed to a chair (including a wheelchair)?: A Lot Help needed standing up from a chair using your arms (e.g., wheelchair or bedside chair)?: A Lot Help needed to walk in hospital room?: A Lot Help needed climbing 3-5 steps with a railing? : Total 6 Click Score: 13    End of Session Equipment Utilized During Treatment: Gait belt Activity Tolerance: Patient tolerated treatment well;Patient limited by fatigue;Patient limited by pain Patient left: in bed;with call bell/phone within reach;with nursing/sitter in room;with family/visitor present   PT Visit Diagnosis: Other abnormalities of gait and mobility (R26.89);Muscle weakness (generalized) (M62.81)     Time: 1610-9604 PT Time Calculation (min) (ACUTE ONLY): 32 min  Charges:    $Gait Training: 8-22 mins $Therapeutic Exercise: 8-22 mins $Therapeutic Activity: 8-22 mins PT General Charges $$ ACUTE PT VISIT: 1 Visit                         Faye Ramsay, PT Acute Rehabilitation  Office: [phone]

## 2024-03-20 NOTE — Progress Notes (Signed)
 Patient resting well, no needs at this time, no new bleeding noted at incision site.

## 2024-03-20 NOTE — Care Management Obs Status (Signed)
 MEDICARE OBSERVATION STATUS NOTIFICATION   Patient Details  Name: [name] MRN: [id number] Date of Birth: 06/26/1961   Medicare Observation Status Notification Given:  Yes    Diona Browner, LCSW 03/20/2024, 12:18 PM

## 2024-03-20 NOTE — TOC CM/SW Note (Signed)

## 2024-03-20 NOTE — Discharge Instructions (Signed)
 Per [facility] clinic policy, our goal is ensure optimal postoperative pain control with a multimodal pain management strategy. For all [facility] patients, our goal is to wean post-operative narcotic medications by 6 weeks post-operatively. If this is not possible due to utilization of pain medication prior to surgery, your [facility] doctor will support your acute post-operative pain control for the first 6 weeks postoperatively, with a plan to transition you back to your primary pain team following that. [name] will work to ensure a Therapist, occupational.  INSTRUCTIONS AFTER JOINT REPLACEMENT   Remove items at home which could result in a fall. This includes throw rugs or furniture in walking pathways ICE to the affected joint every three hours while awake for 30 minutes at a time, for at least the first 3-5 days, and then as needed for pain and swelling.  Continue to use ice for pain and swelling. You may notice swelling that will progress down to the foot and ankle.  This is normal after surgery.  Elevate your leg when you are not up walking on it.   Continue to use the breathing machine you got in the hospital (incentive spirometer) which will help keep your temperature down.  It is common for your temperature to cycle up and down following surgery, especially at night when you are not up moving around and exerting yourself.  The breathing machine keeps your lungs expanded and your temperature down.   DIET:  As you were doing prior to hospitalization, we recommend a well-balanced diet.  DRESSING / WOUND CARE / SHOWERING  Keep the surgical dressing until follow up.  The dressing is water proof, so you can shower without any extra covering.  IF THE DRESSING FALLS OFF or the wound gets wet inside, change the dressing with sterile gauze.  Please use good hand washing techniques before changing the dressing.  Do not use any lotions or creams on the incision until instructed by your surgeon.     ACTIVITY  Increase activity slowly as tolerated, but follow the weight bearing instructions below.   No driving for 6 weeks or until further direction given by your physician.  You cannot drive while taking narcotics.  No lifting or carrying greater than 10 lbs. until further directed by your surgeon. Avoid periods of inactivity such as sitting longer than an hour when not asleep. This helps prevent blood clots.  You may return to work once you are authorized by your doctor.     WEIGHT BEARING   Weight bearing as tolerated with assist device (walker, cane, etc) as directed, use it as long as suggested by your surgeon or therapist, typically at least 4-6 weeks.   EXERCISES  Results after joint replacement surgery are often greatly improved when you follow the exercise, range of motion and muscle strengthening exercises prescribed by your doctor. Safety measures are also important to protect the joint from further injury. Any time any of these exercises cause you to have increased pain or swelling, decrease what you are doing until you are comfortable again and then slowly increase them. If you have problems or questions, call your caregiver or physical therapist for advice.   Rehabilitation is important following a joint replacement. After just a few days of immobilization, the muscles of the leg can become weakened and shrink (atrophy).  These exercises are designed to build up the tone and strength of the thigh and leg muscles and to improve motion. Often times heat used for twenty to thirty minutes before  working out will loosen up your tissues and help with improving the range of motion but do not use heat for the first two weeks following surgery (sometimes heat can increase post-operative swelling).   These exercises can be done on a training (exercise) mat, on the floor, on a table or on a bed. Use whatever works the best and is most comfortable for you.    Use music or television  while you are exercising so that the exercises are a pleasant break in your day. This will make your life better with the exercises acting as a break in your routine that you can look forward to.   Perform all exercises about fifteen times, three times per day or as directed.  You should exercise both the operative leg and the other leg as well.  Exercises include:   Quad Sets - Tighten up the muscle on the front of the thigh (Quad) and hold for 5-10 seconds.   Straight Leg Raises - With your knee straight (if you were given a brace, keep it on), lift the leg to 60 degrees, hold for 3 seconds, and slowly lower the leg.  Perform this exercise against resistance later as your leg gets stronger.  Leg Slides: Lying on your back, slowly slide your foot toward your buttocks, bending your knee up off the floor (only go as far as is comfortable). Then slowly slide your foot back down until your leg is flat on the floor again.  Angel Wings: Lying on your back spread your legs to the side as far apart as you can without causing discomfort.  Hamstring Strength:  Lying on your back, push your heel against the floor with your leg straight by tightening up the muscles of your buttocks.  Repeat, but this time bend your knee to a comfortable angle, and push your heel against the floor.  You may put a pillow under the heel to make it more comfortable if necessary.   A rehabilitation program following joint replacement surgery can speed recovery and prevent re-injury in the future due to weakened muscles. Contact your doctor or a physical therapist for more information on knee rehabilitation.    CONSTIPATION  Constipation is defined medically as fewer than three stools per week and severe constipation as less than one stool per week.  Even if you have a regular bowel pattern at home, your normal regimen is likely to be disrupted due to multiple reasons following surgery.  Combination of anesthesia, postoperative  narcotics, change in appetite and fluid intake all can affect your bowels.   YOU MUST use at least one of the following options; they are listed in order of increasing strength to get the job done.  They are all available over the counter, and you may need to use some, POSSIBLY even all of these options:    Drink plenty of fluids (prune juice may be helpful) and high fiber foods Colace 100 mg by mouth twice a day  Senokot for constipation as directed and as needed Dulcolax (bisacodyl), take with full glass of water  Miralax (polyethylene glycol) once or twice a day as needed.  If you have tried all these things and are unable to have a bowel movement in the first 3-4 days after surgery call either your surgeon or your primary doctor.    If you experience loose stools or diarrhea, hold the medications until you stool forms back up.  If your symptoms do not get better within 1 week  or if they get worse, check with your doctor.  If you experience "the worst abdominal pain ever" or develop nausea or vomiting, please contact the office immediately for further recommendations for treatment.   ITCHING:  If you experience itching with your medications, try taking only a single pain pill, or even half a pain pill at a time.  You can also use Benadryl over the counter for itching or also to help with sleep.   TED HOSE STOCKINGS:  Use stockings on both legs until for at least 2 weeks or as directed by physician office. They may be removed at night for sleeping.  MEDICATIONS:  See your medication summary on the "After Visit Summary" that nursing will review with you.  You may have some home medications which will be placed on hold until you complete the course of blood thinner medication.  It is important for you to complete the blood thinner medication as prescribed.  PRECAUTIONS:  If you experience chest pain or shortness of breath - call 911 immediately for transfer to the hospital emergency department.    If you develop a fever greater that 101 F, purulent drainage from wound, increased redness or drainage from wound, foul odor from the wound/dressing, or calf pain - CONTACT YOUR SURGEON.                                                   FOLLOW-UP APPOINTMENTS:  If you do not already have a post-op appointment, please call the office for an appointment to be seen by your surgeon.  Guidelines for how soon to be seen are listed in your "After Visit Summary", but are typically between 1-4 weeks after surgery.  OTHER INSTRUCTIONS:   Knee Replacement:  Do not place pillow under knee, focus on keeping the knee straight while resting. CPM instructions: 0-90 degrees, 2 hours in the morning, 2 hours in the afternoon, and 2 hours in the evening. Place foam block, curve side up under heel at all times except when in CPM or when walking.  DO NOT modify, tear, cut, or change the foam block in any way.  POST-OPERATIVE OPIOID TAPER INSTRUCTIONS: It is important to wean off of your opioid medication as soon as possible. If you do not need pain medication after your surgery it is ok to stop day one. Opioids include: Codeine, Hydrocodone(Norco, Vicodin), Oxycodone(Percocet, oxycontin) and hydromorphone amongst others.  Long term and even short term use of opiods can cause: Increased pain response Dependence Constipation Depression Respiratory depression And more.  Withdrawal symptoms can include Flu like symptoms Nausea, vomiting And more Techniques to manage these symptoms Hydrate well Eat regular healthy meals Stay active Use relaxation techniques(deep breathing, meditating, yoga) Do Not substitute Alcohol to help with tapering If you have been on opioids for less than two weeks and do not have pain than it is ok to stop all together.  Plan to wean off of opioids This plan should start within one week post op of your joint replacement. Maintain the same interval or time between taking each dose  and first decrease the dose.  Cut the total daily intake of opioids by one tablet each day Next start to increase the time between doses. The last dose that should be eliminated is the evening dose.   MAKE SURE YOU:  Understand these instructions.  Get help right away if you are not doing well or get worse.    Thank you for letting us be a part of your medical care team.  It is a privilege we respect greatly.  We hope these instructions will help you stay on track for a fast and full recovery!      Dental Antibiotics:  In most cases prophylactic antibiotics for Dental procdeures after total joint surgery are not necessary.  Exceptions are as follows:  1. History of prior total joint infection  2. Severely immunocompromised (Organ Transplant, cancer chemotherapy, Rheumatoid biologic meds such as Humera)  3. Poorly controlled diabetes (A1C &gt; 8.0, blood glucose over 200)  If you have one of these conditions, contact your surgeon for an antibiotic prescription, prior to your dental procedure.

## 2024-03-20 NOTE — TOC Transition Note (Signed)
 Transition of Care [facility]) - Discharge Note   Patient Details  Name: [name] MRN: [id number] Date of Birth: 1961-09-06  Transition [facility]) CM/SW Contact:  Diona Browner, LCSW Phone Number: 03/20/2024, 3:28 PM   Clinical Narrative:    Pt therapy plan is home health. [facility] will provide PT services. RW ordered via [employer] to be delivered to pt room. No further TOC needs.    Final next level of care: Home w Home Health Services Barriers to Discharge: No Barriers Identified   Patient Goals and CMS Choice Patient states their goals for this hospitalization and ongoing recovery are:: return home CMS Medicare.gov Compare Post Acute Care list provided to::  (NA) Choice offered to / list presented to : NA [place] ownership interest in [facility].provided to::  (NA)    Discharge Placement                       Discharge Plan and Services Additional resources added to the After Visit Summary for                  DME Arranged: Walker rolling DME Agency: [employer] Date DME Agency Contacted: 03/20/24 Time DME Agency Contacted: 1200 Representative spoke with at DME Agency: Vaughan Basta HH Arranged: PT HH Agency: [facility] Date [facility] Agency Contacted: 03/20/24 Time HH Agency Contacted: 1200 Representative spoke with at [facility] Agency: Kandee Keen  Social Drivers of Health (SDOH) Interventions SDOH Screenings   Food Insecurity: No Food Insecurity (03/19/2024)  Housing: Low Risk  (03/19/2024)  Transportation Needs: No Transportation Needs (03/19/2024)  Utilities: Not At Risk (03/19/2024)  Depression (PHQ2-9): Medium Risk (06/12/2020)  Social Connections: Moderately Integrated (03/19/2024)  Tobacco Use: Low Risk  (03/19/2024)     Readmission Risk Interventions     No data to display

## 2024-03-20 NOTE — Progress Notes (Signed)
 scant amount of bloody drainage noted at the proximal end of the incision on the ace wrap, when lifting ace wrap up, intact staples noted to incision line with vasoline gauze in place over top, 4x4 gauze placed over the first few staples and a small additional ace wrap applied, will continue to monitor.

## 2024-03-21 DIAGNOSIS — M1711 Unilateral primary osteoarthritis, right knee: Secondary | ICD-10-CM | POA: Diagnosis not present

## 2024-03-21 MED ORDER — CYCLOBENZAPRINE HCL 10 MG PO TABS
10.0000 mg | ORAL_TABLET | Freq: Three times a day (TID) | ORAL | Status: DC | PRN
  Administered 2024-03-21: 10 mg via ORAL
  Filled 2024-03-21: qty 1

## 2024-03-21 NOTE — Progress Notes (Addendum)
 Physical Therapy Treatment Patient Details Name: [name] MRN: [id number] DOB: [birth year] Today's Date: 03/21/2024   History of Present Illness Pt is 63 yo female s/p R TKA on 03/19/24.  Pt with hx including but not limited to L TKA 2018 , HTN, HCL, back pain, DDD, fibromyalgia, RA, ACDF    PT Comments  Pt agreeable to working with therapy. Pt presents with increased drowsiness, likely due to pain meds, but still with reports of significant pain. Mobility remains limited by pain, weakness, and fatigue. Pt is at high risk for falls when mobilizing. HR in low 50s, O2>90% on RA but pt with wheezing requiring use of her inhaler. Unfortunately pt has been progressing slowly with mobility. Based on slow progress, fall risk, and requirement of +2 assist for mobility, feel patient will benefit from continued inpatient follow up therapy, <3 hours/day. PT recommendation has been updated to ST SNF rehab at this time.   If plan is discharge home, recommend the following: A lot of help with bathing/dressing/bathroom;Assistance with cooking/housework;Assist for transportation;Help with stairs or ramp for entrance;A lot of help with walking and/or transfers   Can travel by private vehicle        Equipment Recommendations  Hospital bed;BSC/3in1;Rolling walker (2 wheels)    Recommendations for Other Services       Precautions / Restrictions Precautions Precautions: Fall;Knee; R drop foot Restrictions Weight Bearing Restrictions Per Provider Order: No RLE Weight Bearing Per Provider Order: Weight bearing as tolerated     Mobility  Bed Mobility Overal bed mobility: Needs Assistance Bed Mobility: Sit to Supine      Sit to supine: Mod assist   General bed mobility comments: Assist for bil LEs. Increased time. Cues provided.    Transfers Overall transfer level: Needs assistance Equipment used: Rolling walker (2 wheels) Transfers: Sit to/from Stand Sit to Stand: Mod assist, +2 physical  assistance, +2 safety/equipment           General transfer comment: Assist to rise, steady, control descent. Cues for safety, technique, hand/LE placement. Pt tends to want to pull up on walker. Stood x 3-2 times from recliner, 1 time from [facility] Fall risk.    Ambulation/Gait Ambulation/Gait assistance: Min assist, +2 physical assistance, +2 safety/equipment Gait Distance (Feet): 2 Feet Assistive device: Rolling walker (2 wheels) Gait Pattern/deviations: Step-to pattern, Trunk flexed, Antalgic, Decreased dorsiflexion - right, Knee flexed in stance - right, Knee flexed in stance - left       General Gait Details: Pt unable to take more than 1-2 steps this afternoon before having to sit due to pain, weakness, fatigue. High fall risk.   Stairs             Wheelchair Mobility     Tilt Bed    Modified Rankin (Stroke Patients Only)       Balance Overall balance assessment: Needs assistance         Standing balance support: Bilateral upper extremity supported, Reliant on assistive device for balance Standing balance-Leahy Scale: Poor                              Communication Communication Communication: No apparent difficulties  Cognition Arousal: Suspect due to medications (drowsy, slow) Behavior During Therapy: Flat affect   PT - Cognitive impairments: No apparent impairments  Following commands: Intact      Cueing Cueing Techniques: Verbal cues  Exercises     General Comments        Pertinent Vitals/Pain Pain Assessment Pain Assessment: Faces Faces Pain Scale: Hurts whole lot Pain Location: R knee/thigh Pain Descriptors / Indicators: Grimacing, Operative site guarding, Aching Pain Intervention(s): Limited activity within patient's tolerance, Monitored during session, Repositioned    Home Living                          Prior Function            PT Goals (current goals can now be found  in the care plan section) Progress towards PT goals: Not progressing toward goals - comment (remains limited by pain, weakness, fatigue)    Frequency    7X/week      PT Plan      Co-evaluation              AM-PAC PT "6 Clicks" Mobility   Outcome Measure  Help needed turning from your back to your side while in a flat bed without using bedrails?: A Lot Help needed moving from lying on your back to sitting on the side of a flat bed without using bedrails?: A Lot Help needed moving to and from a bed to a chair (including a wheelchair)?: A Lot Help needed standing up from a chair using your arms (e.g., wheelchair or bedside chair)?: A Lot Help needed to walk in hospital room?: Total Help needed climbing 3-5 steps with a railing? : Total 6 Click Score: 10    End of Session Equipment Utilized During Treatment: Gait belt Activity Tolerance: Patient tolerated treatment well;Patient limited by fatigue;Patient limited by pain Patient left: in bed;with call bell/phone within reach;with family/visitor present   PT Visit Diagnosis: Other abnormalities of gait and mobility (R26.89);Muscle weakness (generalized) (M62.81)     Time: 1610-9604 PT Time Calculation (min) (ACUTE ONLY): 38 min  Charges:    $Gait Training: 23-37 mins $Therapeutic Activity: 8-22 mins PT General Charges $$ ACUTE PT VISIT: 1 Visit                        Faye Ramsay, PT Acute Rehabilitation  Office: [phone]

## 2024-03-21 NOTE — Plan of Care (Signed)
   Problem: Coping: Goal: Level of anxiety will decrease Outcome: Progressing   Problem: Pain Managment: Goal: General experience of comfort will improve and/or be controlled Outcome: Progressing

## 2024-03-21 NOTE — Progress Notes (Signed)
 Physical Therapy Treatment Patient Details Name: [name] MRN: [id number] DOB: December 17, 1960 Today's Date: 03/21/2024   History of Present Illness Pt is 63 yo female s/p R TKA on 03/19/24.  Pt with hx including but not limited to L TKA 2018 , HTN, HCL, back pain, DDD, fibromyalgia, RA, ACDF    PT Comments  Pt agreeable to working with therapy. Continues to require +2 for safe mobility. Mobility limited by pain, fatigue, and general weakness. Will continue to progress activity as tolerated. Hopeful for d/c home with family assistance but may need to consider ST rehab SNF.     If plan is discharge home, recommend the following: A lot of help with bathing/dressing/bathroom;Assistance with cooking/housework;Assist for transportation;Help with stairs or ramp for entrance;A little help with walking and/or transfers   Can travel by private vehicle        Equipment Recommendations  Hospital bed;BSC/3in1;Rolling walker (2 wheels)    Recommendations for Other Services       Precautions / Restrictions Precautions Precautions: Fall;Knee Restrictions Weight Bearing Restrictions Per Provider Order: No RLE Weight Bearing Per Provider Order: Weight bearing as tolerated     Mobility  Bed Mobility Overal bed mobility: Needs Assistance Bed Mobility: Supine to Sit     Supine to sit: Min assist     General bed mobility comments: Assist for R LE. Increased time. Cues provided.    Transfers Overall transfer level: Needs assistance Equipment used: Rolling walker (2 wheels) Transfers: Sit to/from Stand Sit to Stand: Min assist, +2 physical assistance, +2 safety/equipment, From elevated surface           General transfer comment: Assist to rise, steady, control descent. Cues for safety, technique, hand/LE placement. Pt tends to want to pull up on walker.    Ambulation/Gait Ambulation/Gait assistance: Min assist, +2 safety/equipment Gait Distance (Feet): 25 Feet Assistive device:  Rolling walker (2 wheels) Gait Pattern/deviations: Step-to pattern, Trunk flexed, Decreased dorsiflexion - right       General Gait Details: Cues for safety, sequencing, posture, attention to proper clearance/advancement of R LE during gait 2* decreased dorsiflexion (pt with DF weakness). Followed with recliner and used it to transport pt back to bedside. Distance limited by pain, fatigue.   Stairs             Wheelchair Mobility     Tilt Bed    Modified Rankin (Stroke Patients Only)       Balance Overall balance assessment: Needs assistance         Standing balance support: Bilateral upper extremity supported, Reliant on assistive device for balance Standing balance-Leahy Scale: Poor                              Communication Communication Communication: No apparent difficulties  Cognition Arousal: Alert Behavior During Therapy: WFL for tasks assessed/performed   PT - Cognitive impairments: No apparent impairments                         Following commands: Intact      Cueing Cueing Techniques: Verbal cues  Exercises Total Joint Exercises Ankle Circles/Pumps: AROM, Left, 10 reps (AAROM R foot) Quad Sets: AROM, Both, 10 reps Straight Leg Raises: AAROM, Right, 10 reps    General Comments        Pertinent Vitals/Pain Pain Assessment Pain Assessment: Faces Faces Pain Scale: Hurts even more Pain Location: R knee/thigh Pain  Descriptors / Indicators: Grimacing, Operative site guarding, Aching Pain Intervention(s): Limited activity within patient's tolerance, Monitored during session, Repositioned    Home Living                          Prior Function            PT Goals (current goals can now be found in the care plan section) Progress towards PT goals: Progressing toward goals    Frequency    7X/week      PT Plan      Co-evaluation              AM-PAC PT "6 Clicks" Mobility   Outcome Measure   Help needed turning from your back to your side while in a flat bed without using bedrails?: A Little Help needed moving from lying on your back to sitting on the side of a flat bed without using bedrails?: A Little Help needed moving to and from a bed to a chair (including a wheelchair)?: A Lot Help needed standing up from a chair using your arms (e.g., wheelchair or bedside chair)?: A Lot Help needed to walk in hospital room?: A Lot Help needed climbing 3-5 steps with a railing? : Total 6 Click Score: 13    End of Session Equipment Utilized During Treatment: Gait belt Activity Tolerance: Patient tolerated treatment well;Patient limited by fatigue;Patient limited by pain Patient left: with call bell/phone within reach;with nursing/sitter in room;with family/visitor present;in chair   PT Visit Diagnosis: Other abnormalities of gait and mobility (R26.89);Muscle weakness (generalized) (M62.81)     Time: 4098-1191 PT Time Calculation (min) (ACUTE ONLY): 28 min  Charges:    $Gait Training: 8-22 mins $Therapeutic Exercise: 8-22 mins PT General Charges $$ ACUTE PT VISIT: 1 Visit                        Faye Ramsay, PT Acute Rehabilitation  Office: [phone]

## 2024-03-21 NOTE — Progress Notes (Signed)
 Subjective: 2 Days Post-Op Procedure(s) (LRB): ARTHROPLASTY, KNEE, TOTAL (Right) Patient reports pain as moderate.  Slow progress with PT.   Objective: Vital signs in last 24 hours: Temp:  [97.4 F (36.3 C)-98.8 F (37.1 C)] 97.6 F (36.4 C) (04/06 0546) Pulse Rate:  [53-64] 63 (04/06 0546) Resp:  [16-17] 17 (04/06 0546) BP: (124-133)/(67-87) 133/82 (04/06 0546) SpO2:  [99 %-100 %] 100 % (04/06 0546)  Intake/Output from previous day: 04/05 0701 - 04/06 0700 In: 1060 [P.O.:1060] Out: -  Intake/Output this shift: No intake/output data recorded.  Recent Labs    03/20/24 0326  HGB 11.1*   Recent Labs    03/20/24 0326  WBC 10.9*  RBC 3.58*  HCT 34.7*  PLT 323   Recent Labs    03/19/24 1410 03/20/24 0326  NA 136 136  K 4.6 3.9  CL 103 106  CO2 26 24  BUN 19 23  CREATININE 0.79 0.74  GLUCOSE 108* 163*  CALCIUM 8.9 8.9   No results for input(s): "LABPT", "INR" in the last 72 hours.  Intact pulses distally Incision: moderate drainage Compartment soft Foot drop right    Assessment/Plan: 2 Days Post-Op Procedure(s) (LRB): ARTHROPLASTY, KNEE, TOTAL (Right) Up with therapy Dressing changed.  Will need an AFO right foot.       [name] 03/21/2024, 10:10 AM

## 2024-03-22 ENCOUNTER — Encounter (HOSPITAL_COMMUNITY): Payer: Self-pay | Admitting: Orthopaedic Surgery

## 2024-03-22 DIAGNOSIS — M1711 Unilateral primary osteoarthritis, right knee: Secondary | ICD-10-CM | POA: Diagnosis not present

## 2024-03-22 LAB — BASIC METABOLIC PANEL WITH GFR
Anion gap: 9 (ref 5–15)
BUN: 26 mg/dL — ABNORMAL HIGH (ref 8–23)
CO2: 27 mmol/L (ref 22–32)
Calcium: 8.4 mg/dL — ABNORMAL LOW (ref 8.9–10.3)
Chloride: 97 mmol/L — ABNORMAL LOW (ref 98–111)
Creatinine, Ser: 1.09 mg/dL — ABNORMAL HIGH (ref 0.44–1.00)
GFR, Estimated: 57 mL/min — ABNORMAL LOW (ref >60)
Glucose, Bld: 118 mg/dL — ABNORMAL HIGH (ref 70–99)
Potassium: 3.3 mmol/L — ABNORMAL LOW (ref 3.5–5.1)
Sodium: 133 mmol/L — ABNORMAL LOW (ref 135–145)

## 2024-03-22 LAB — CBC
HCT: 32.3 % — ABNORMAL LOW (ref 36.0–46.0)
Hemoglobin: 10.3 g/dL — ABNORMAL LOW (ref 12.0–15.0)
MCH: 30.8 pg (ref 26.0–34.0)
MCHC: 31.9 g/dL (ref 30.0–36.0)
MCV: 96.7 fL (ref 80.0–100.0)
Platelets: 346 10*3/uL (ref 150–400)
RBC: 3.34 MIL/uL — ABNORMAL LOW (ref 3.87–5.11)
RDW: 13.4 % (ref 11.5–15.5)
WBC: 10.4 10*3/uL (ref 4.0–10.5)
nRBC: 0 % (ref 0.0–0.2)

## 2024-03-22 NOTE — TOC CM/SW Note (Signed)
    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

## 2024-03-22 NOTE — Progress Notes (Signed)
  Diagnosis code: M17.11, Z96.651  Height: 5' 7'  Weight: 133 kg    Patient status post Right Total Knee Arthroplasty with postoperative Right Foot Drop requiring an Ankle Foot Orthosis. Requiring a hospital bed for positioning of the body to alleviate pain, and promote good body alignment in ways not feasible in a normal bed.

## 2024-03-22 NOTE — Progress Notes (Signed)
 Physical Therapy Treatment Patient Details Name: [name] MRN: [id number] DOB: 08/27/1961 Today's Date: 03/22/2024   History of Present Illness Pt is 63 yo female s/p R TKA on 03/19/24.  Pt with hx including but not limited to L TKA 2018 , HTN, HCL, back pain, DDD, fibromyalgia, RA, ACDF    PT Comments  Pt making good progress today.  She was more alert (dtr reports no pain meds this morning)  and able to ambulate 30' with CGA and chair follow.  Good quad activation but still no DF and awaiting AFO.  Will continue to benefit from therapy.     If plan is discharge home, recommend the following: Assistance with cooking/housework;Assist for transportation;Help with stairs or ramp for entrance;A little help with walking and/or transfers;A little help with bathing/dressing/bathroom   Can travel by private vehicle        Equipment Recommendations  Hospital bed;BSC/3in1;Rolling walker (2 wheels)    Recommendations for Other Services       Precautions / Restrictions Precautions Precautions: Fall;Knee Required Braces or Orthoses: Knee Immobilizer - Right;Other Brace Knee Immobilizer - Right: Discontinue once straight leg raise with < 10 degree lag Other Brace: AFO has been ordered but not present yet Restrictions RLE Weight Bearing Per Provider Order: Weight bearing as tolerated     Mobility  Bed Mobility               General bed mobility comments: on BSC at arrival    Transfers Overall transfer level: Needs assistance Equipment used: Rolling walker (2 wheels) Transfers: Sit to/from Stand Sit to Stand: Min assist           General transfer comment: Stood x 2 with min A and cues for hand placement    Ambulation/Gait Ambulation/Gait assistance: Contact guard assist Gait Distance (Feet): 30 Feet Assistive device: Rolling walker (2 wheels) Gait Pattern/deviations: Step-to pattern, Decreased stride length, Decreased weight shift to right, Decreased dorsiflexion -  right Gait velocity: decreased     General Gait Details: Antalgic but steady gait.  Cued for increased knee and hip flexion to compensate for no DF - still awaiting AFO.  Advised bringing tennis shoes for AFO. Good knee stability   Stairs             Wheelchair Mobility     Tilt Bed    Modified Rankin (Stroke Patients Only)       Balance Overall balance assessment: Needs assistance Sitting-balance support: No upper extremity supported Sitting balance-Leahy Scale: Good     Standing balance support: Bilateral upper extremity supported, Reliant on assistive device for balance Standing balance-Leahy Scale: Poor                              Communication    Cognition Arousal: Alert Behavior During Therapy: WFL for tasks assessed/performed   PT - Cognitive impairments: No apparent impairments                                Cueing    Exercises Total Joint Exercises Ankle Circles/Pumps: AROM, Left, PROM, Right, Seated, 15 reps (PROM on R with heel cord stretch 5 sec hold each rep) Quad Sets: AROM, Both, 10 reps, Supine Heel Slides: AAROM, Right, 10 reps Hip ABduction/ADduction: AAROM, Right, 10 reps, Supine Long Arc Quad: AAROM, Right, 10 reps, Seated    General Comments  Pertinent Vitals/Pain Pain Assessment Pain Assessment: Faces Faces Pain Scale: Hurts little more Pain Location: R knee/thigh Pain Descriptors / Indicators: Discomfort Pain Intervention(s): Limited activity within patient's tolerance, Monitored during session, Repositioned, Ice applied (daughter reports no pain meds this mornign b/c making her lethargic)    Home Living                          Prior Function            PT Goals (current goals can now be found in the care plan section) Progress towards PT goals: Progressing toward goals    Frequency    7X/week      PT Plan      Co-evaluation              AM-PAC PT "6 Clicks"  Mobility   Outcome Measure  Help needed turning from your back to your side while in a flat bed without using bedrails?: A Lot Help needed moving from lying on your back to sitting on the side of a flat bed without using bedrails?: A Lot Help needed moving to and from a bed to a chair (including a wheelchair)?: A Little Help needed standing up from a chair using your arms (e.g., wheelchair or bedside chair)?: A Little Help needed to walk in hospital room?: A Little Help needed climbing 3-5 steps with a railing? : A Lot 6 Click Score: 15    End of Session Equipment Utilized During Treatment: Gait belt Activity Tolerance: Patient tolerated treatment well Patient left: with call bell/phone within reach;with family/visitor present;in chair Nurse Communication: Mobility status PT Visit Diagnosis: Other abnormalities of gait and mobility (R26.89);Muscle weakness (generalized) (M62.81)     Time: 6578-4696 PT Time Calculation (min) (ACUTE ONLY): 21 min  Charges:    $Gait Training: 8-22 mins PT General Charges $$ ACUTE PT VISIT: 1 Visit                     Anise Salvo, PT Acute Rehab Services [facility] [phone]    Rayetta Humphrey 03/22/2024, 11:16 AM

## 2024-03-22 NOTE — TOC Progression Note (Addendum)
 Transition of Care [facility]) - Progression Note    Patient Details  Name: [name] MRN: [id number] Date of Birth: July 11, 1961  [facility] of [facility]) CM/SW Contact  Howell Rucks, RN Phone Number: 03/22/2024, 11:02 AM  Clinical Narrative:   Met with patient and dtr , Cala Bradford, at bedside to introduce self and review for dc planning. Patient has delivered RW at bedside, patient requesting a BSC, [employer] rep[employer] to deliver to bedside, also requesting a hospital bed as patient s/p TKA with foot drop, dtr reports pt has a high mattress on bed and will be unable to climb in bed. NCM outreached to [employer] w/[employer], states he will check to see if pt eligible. TOC will continue to follow.   -12:41 Call from [facility] with [facility], confirmed pt eligible for hospital bed. Request sent to attending to enter order for hospital bed and cosign progress not narrative for hospital bed medical necessity.       Barriers to Discharge: No Barriers Identified  Expected Discharge Plan and Services                         DME Arranged: Walker rolling DME Agency: [employer] Date DME Agency Contacted: 03/20/24 Time DME Agency Contacted: 1200 Representative spoke with at DME Agency: Vaughan Basta HH Arranged: PT HH Agency: [facility] Date [facility] Agency Contacted: 03/20/24 Time HH Agency Contacted: 1200 Representative spoke with at [facility] Agency: Kandee Keen   Social Determinants of Health (SDOH) Interventions SDOH Screenings   Food Insecurity: No Food Insecurity (03/19/2024)  Housing: Low Risk  (03/19/2024)  Transportation Needs: No Transportation Needs (03/19/2024)  Utilities: Not At Risk (03/19/2024)  Depression (PHQ2-9): Medium Risk (06/12/2020)  Social Connections: Moderately Integrated (03/19/2024)  Tobacco Use: Low Risk  (03/19/2024)    Readmission Risk Interventions     No data to display

## 2024-03-22 NOTE — Progress Notes (Signed)
 Orthopedic Tech Progress Note Patient Details:  HOUA NIE 03-May-1961 [id number]  Patient ID: [name], female   DOB: 05/19/1961, 63 y.o.   MRN: [id number] AFO order placed with [facility]. Darleen Crocker 03/22/2024, 8:29 AM

## 2024-03-22 NOTE — Progress Notes (Signed)
 Patient ID: [name], female   DOB: January 03, 1961, 63 y.o.   MRN: [id number] The patient is still quite limited in her mobility.  Her blood pressure is a little low this morning.  Some of this may be related to pain medications.  Her postoperative hemoglobin was 11.  PT will continue to work with the patient.  She is slowed down by her foot drop so we will also order OT consult for ADLs as well as getting her an AFO for her right lower extremity.

## 2024-03-22 NOTE — Plan of Care (Signed)
   Problem: Coping: Goal: Level of anxiety will decrease Outcome: Progressing   Problem: Pain Managment: Goal: General experience of comfort will improve and/or be controlled Outcome: Progressing   Problem: Safety: Goal: Ability to remain free from injury will improve Outcome: Progressing

## 2024-03-22 NOTE — Progress Notes (Signed)
 Physical Therapy Treatment Patient Details Name: [name] MRN: [id number] DOB: 1961/09/20 Today's Date: 03/22/2024   History of Present Illness Pt is 63 yo female s/p R TKA on 03/19/24.  Pt with hx including but not limited to L TKA 2018 , HTN, HCL, back pain, DDD, fibromyalgia, RA, ACDF    PT Comments  Pt making gradual progress. Pt does not have AFO - noted order placed with [employer].  She does compensate some for foot drop but still has difficulty advancing R LE and would benefit from AFO. Also, daughter to bring tennis shoes when she goes home today.  Will continue but benefit from therapy - requiring mod A transfers and needs training with AFO.  Cont POC.    If plan is discharge home, recommend the following: Assistance with cooking/housework;Assist for transportation;Help with stairs or ramp for entrance;A little help with walking and/or transfers;A little help with bathing/dressing/bathroom   Can travel by private vehicle        Equipment Recommendations  Hospital bed;BSC/3in1;Rolling walker (2 wheels)    Recommendations for Other Services       Precautions / Restrictions Precautions Precautions: Fall;Knee Required Braces or Orthoses: Knee Immobilizer - Right;Other Brace Knee Immobilizer - Right: Discontinue once straight leg raise with < 10 degree lag Other Brace: AFO has been ordered but not present yet Restrictions RLE Weight Bearing Per Provider Order: Weight bearing as tolerated     Mobility  Bed Mobility               General bed mobility comments: in recliner, returned to [facility]    Transfers Overall transfer level: Needs assistance Equipment used: Rolling walker (2 wheels) Transfers: Sit to/from Stand Sit to Stand: Mod assist           General transfer comment: Stood x 2 with min A and cues for hand placement; increased time to rise; assist to extend R leg with sitting    Ambulation/Gait Ambulation/Gait assistance: Contact guard assist Gait  Distance (Feet): 30 Feet Assistive device: Rolling walker (2 wheels) Gait Pattern/deviations: Step-to pattern, Decreased stride length, Decreased weight shift to right, Decreased dorsiflexion - right Gait velocity: decreased     General Gait Details: Antalgic but steady gait.  Cued for increased knee and hip flexion to compensate for no DF - still awaiting AFO.  Advised bringing tennis shoes for AFO. Good knee stability   Stairs             Wheelchair Mobility     Tilt Bed    Modified Rankin (Stroke Patients Only)       Balance Overall balance assessment: Needs assistance Sitting-balance support: No upper extremity supported Sitting balance-Leahy Scale: Good     Standing balance support: Bilateral upper extremity supported, Reliant on assistive device for balance Standing balance-Leahy Scale: Poor                              Communication    Cognition Arousal: Alert Behavior During Therapy: WFL for tasks assessed/performed   PT - Cognitive impairments: No apparent impairments                                Cueing    Exercises Total Joint Exercises Ankle Circles/Pumps: AROM, Left, PROM, Right, Seated, 15 reps (PROM on R with heel cord stretch 5 sec hold each rep) Quad Sets: AROM, Both,  10 reps, Supine Heel Slides: AAROM, Right, 10 reps Hip ABduction/ADduction: AAROM, Right, 10 reps, Supine Long Arc Quad: AAROM, Right, 10 reps, Seated Knee Flexion: AAROM, Right, 10 reps, Seated Goniometric ROM: R knee ~5 to 45 degrees    General Comments        Pertinent Vitals/Pain Pain Assessment Pain Assessment: 0-10 Pain Score: 2  Faces Pain Scale: Hurts little more Pain Location: R knee/thigh Pain Descriptors / Indicators: Discomfort Pain Intervention(s): Limited activity within patient's tolerance, Monitored during session, Repositioned    Home Living                          Prior Function            PT Goals  (current goals can now be found in the care plan section) Progress towards PT goals: Progressing toward goals    Frequency    7X/week      PT Plan      Co-evaluation              AM-PAC PT "6 Clicks" Mobility   Outcome Measure  Help needed turning from your back to your side while in a flat bed without using bedrails?: A Lot Help needed moving from lying on your back to sitting on the side of a flat bed without using bedrails?: A Lot Help needed moving to and from a bed to a chair (including a wheelchair)?: A Little Help needed standing up from a chair using your arms (e.g., wheelchair or bedside chair)?: A Little Help needed to walk in hospital room?: A Little Help needed climbing 3-5 steps with a railing? : A Lot 6 Click Score: 15    End of Session Equipment Utilized During Treatment: Gait belt Activity Tolerance: Patient tolerated treatment well Patient left: with call bell/phone within reach;with family/visitor present (Wanted to sit on [facility]; reports will call NT when finished) Nurse Communication: Mobility status PT Visit Diagnosis: Other abnormalities of gait and mobility (R26.89);Muscle weakness (generalized) (M62.81)     Time: 8756-4332 PT Time Calculation (min) (ACUTE ONLY): 20 min  Charges:    $Gait Training: 8-22 mins PT General Charges $$ ACUTE PT VISIT: 1 Visit                     Anise Salvo, PT Acute Rehab Services [facility] [phone]    Rayetta Humphrey 03/22/2024, 1:59 PM

## 2024-03-23 DIAGNOSIS — M1711 Unilateral primary osteoarthritis, right knee: Secondary | ICD-10-CM | POA: Diagnosis not present

## 2024-03-23 MED ORDER — OXYCODONE HCL 5 MG PO TABS
5.0000 mg | ORAL_TABLET | Freq: Four times a day (QID) | ORAL | 0 refills | Status: DC | PRN
Start: 2024-03-23 — End: 2024-03-26

## 2024-03-23 MED ORDER — ASPIRIN 81 MG PO CHEW: 81.0000 mg | CHEWABLE_TABLET | Freq: Two times a day (BID) | ORAL | 0 refills | Status: AC

## 2024-03-23 MED ORDER — CYCLOBENZAPRINE HCL 10 MG PO TABS
10.0000 mg | ORAL_TABLET | Freq: Three times a day (TID) | ORAL | 0 refills | Status: AC | PRN
Start: 2024-03-23 — End: ?

## 2024-03-23 NOTE — Evaluation (Signed)
 Occupational Therapy Evaluation Patient Details Name: [name] MRN: [id number] DOB: 09/10/1961 Today's Date: 03/23/2024   History of Present Illness   Pt is 63 yo female s/p R TKA on 03/19/24.  Pt with hx including but not limited to L TKA 2018 , HTN, HCL, back pain, DDD, fibromyalgia, RA, ACDF     Clinical Impressions PTA, patient ambulated short distances with rollator and had a PCA intermittently for dressing and bathing assistance. Patient presents now with R LE pain, weakness, balance and activity tolerance deficits, limited functional reach with overall max a for LB ADL's and min A for use of HD BSC. Daughter an active participant in this OT evaluation. Patient requires ongoing Acute level OT services to progress toward baseline function. Would benefit from a shower chair for bathing safety and therapy as per MD recommendation post acute level.      If plan is discharge home, recommend the following:   Two people to help with walking and/or transfers;A lot of help with bathing/dressing/bathroom;Assistance with cooking/housework;Direct supervision/assist for medications management;Direct supervision/assist for financial management;Assist for transportation;Help with stairs or ramp for entrance     Functional Status Assessment   Patient has had a recent decline in their functional status and demonstrates the ability to make significant improvements in function in a reasonable and predictable amount of time.     Equipment Recommendations   Tub/shower seat;Other (comment)      Precautions/Restrictions   Precautions Precautions: Fall;Knee Required Braces or Orthoses: Knee Immobilizer - Right;Other Brace Knee Immobilizer - Right: Discontinue once straight leg raise with < 10 degree lag Other Brace: awaiting AFO on order Restrictions Weight Bearing Restrictions Per Provider Order: No RLE Weight Bearing Per Provider Order: Weight bearing as tolerated     Mobility Bed  Mobility Overal bed mobility: Needs Assistance Bed Mobility: Supine to Sit, Sit to Supine     Supine to sit: Min assist Sit to supine: HOB elevated, Used rails   General bed mobility comments: use of gait belt as leg lifter for RLE    Transfers Overall transfer level: Needs assistance Equipment used: Rolling walker (2 wheels) Transfers: Sit to/from Stand, Bed to chair/wheelchair/BSC Sit to Stand: Min assist, From elevated surface     Step pivot transfers: Min assist, From elevated surface     General transfer comment: min cues for hand placement, daughter present as well for transfer training to commode      Balance Overall balance assessment: Needs assistance Sitting-balance support: Feet supported Sitting balance-Leahy Scale: Good     Standing balance support: Bilateral upper extremity supported, Reliant on assistive device for balance Standing balance-Leahy Scale: Poor                             ADL either performed or assessed with clinical judgement   ADL Overall ADL's : Needs assistance/impaired Eating/Feeding: Independent   Grooming: Wash/dry hands;Wash/dry face;Oral care;Applying deodorant;Set up;Sitting   Upper Body Bathing: Set up;Sitting   Lower Body Bathing: With caregiver independent assisting;Sitting/lateral leans;Sit to/from stand;Maximal assistance   Upper Body Dressing : Set up;Sitting   Lower Body Dressing: With caregiver independent assisting;Sitting/lateral leans;Sit to/from stand;Maximal assistance   Toilet Transfer: Rolling walker (2 wheels);BSC/3in1;Minimal assistance;Requires wide/bariatric   Toileting- Clothing Manipulation and Hygiene: Moderate assistance;Sitting/lateral lean;Sit to/from stand       Functional mobility during ADLs: Minimal assistance;Rolling walker (2 wheels) (commode transfer) General ADL Comments: R Foot up brace deleivered at end of  session and [employer] rep instructed pt and dtr in application and left on  R sock     Vision Baseline Vision/History: 1 Wears glasses;0 No visual deficits Ability to See in Adequate Light: 0 Adequate Patient Visual Report: No change from baseline Vision Assessment?: No apparent visual deficits;Wears glasses for reading     Perception Perception: Within Functional Limits       Praxis Praxis: [facility]       Pertinent Vitals/Pain Pain Assessment Pain Assessment: 0-10 Pain Score: 6  Pain Location: R knee Pain Descriptors / Indicators: Aching, Discomfort, Dull, Sore Pain Intervention(s): Limited activity within patient's tolerance, Monitored during session, Repositioned, Patient requesting pain meds-RN notified, Relaxation, Ice applied     Extremity/Trunk Assessment Upper Extremity Assessment Upper Extremity Assessment: Overall WFL for tasks assessed   Lower Extremity Assessment Lower Extremity Assessment: Defer to PT evaluation   Cervical / Trunk Assessment Cervical / Trunk Assessment: Normal   Communication Communication Communication: No apparent difficulties   Cognition Arousal: Alert Behavior During Therapy: WFL for tasks assessed/performed                                 Following commands: Intact       Cueing  General Comments   Cueing Techniques: Verbal cues  RLE edema and post op knee incision intact           Home Living Family/patient expects to be discharged to:: Private residence Living Arrangements: Children Available Help at Discharge: Family;Available 24 hours/day Type of Home: Apartment Home Access: Stairs to enter Entrance Stairs-Number of Steps: step up on curb   Home Layout: One level     Bathroom Shower/Tub: Tub/shower unit;Curtain   Bathroom Toilet: Standard Bathroom Accessibility: Yes How Accessible: Accessible via walker;Other (comment) (will use BSC initially) Home Equipment: Cane - single point;Rollator (4 wheels)   Additional Comments: needs shower chair      Prior  Functioning/Environment Prior Level of Function : Independent/Modified Independent             Mobility Comments: ambulated with rollator - only short community distance ADLs Comments: Pt had aide 5 days that assisted with bathing and dressing.    OT Problem List: Decreased strength;Decreased range of motion;Decreased activity tolerance;Impaired balance (sitting and/or standing);Decreased knowledge of use of DME or AE;Decreased knowledge of precautions;Obesity;Pain;Increased edema   OT Treatment/Interventions: Self-care/ADL training;Energy conservation;DME and/or AE instruction;Modalities;Therapeutic activities;Patient/family education;Balance training      OT Goals(Current goals can be found in the care plan section)   Acute Rehab OT Goals Patient Stated Goal: to raise my R foot and be stronger OT Goal Formulation: With patient/family Time For Goal Achievement: 04/06/24 Potential to Achieve Goals: Good ADL Goals Pt Will Perform Lower Body Bathing: with min assist;sitting/lateral leans;sit to/from stand;with adaptive equipment Pt Will Perform Lower Body Dressing: with min assist;with adaptive equipment;sitting/lateral leans;sit to/from stand Pt Will Transfer to Toilet: ambulating;bedside commode;grab bars;with contact guard assist   OT Frequency:  Min 2X/week       AM-PAC OT "6 Clicks" Daily Activity     Outcome Measure Help from another person eating meals?: None Help from another person taking care of personal grooming?: None Help from another person toileting, which includes using toliet, bedpan, or urinal?: A Lot Help from another person bathing (including washing, rinsing, drying)?: A Lot Help from another person to put on and taking off regular upper body clothing?: A Little Help from another person  to put on and taking off regular lower body clothing?: A Lot 6 Click Score: 17   End of Session Equipment Utilized During Treatment: Gait belt;Rolling walker (2  wheels);Right knee immobilizer Nurse Communication: Mobility status;Patient requests pain meds;Other (comment) (voiding entered into Flowshe[employer])  Activity Tolerance: Patient limited by pain Patient left: in bed;with call bell/phone within reach;with bed alarm set;with family/visitor present  OT Visit Diagnosis: Muscle weakness (generalized) (M62.81);Unsteadiness on feet (R26.81);Pain Pain - Right/Left: Right Pain - part of body: Knee                Time: 0801-0859 OT Time Calculation (min): 58 min Charges:  OT General Charges $OT Visit: 1 Visit OT Evaluation $OT Eval Moderate Complexity: 1 Mod OT Treatments $Self Care/Home Management : 38-52 mins  Kalla Watson OT/L Acute Rehabilitation Department  [phone]  03/23/2024, 11:02 AM

## 2024-03-23 NOTE — Discharge Summary (Signed)
 Patient ID: [name] MRN: [id number] DOB/AGE: [birth year] 63 y.o.  Admit date: 03/19/2024 Discharge date: 03/23/2024  Admission Diagnoses:  Principal Problem:   Unilateral primary osteoarthritis, right knee Active Problems:   Status post total right knee replacement   Discharge Diagnoses:  Same  Past Medical History:  Diagnosis Date   Anxiety    Asthma    Back pain    Chronic pain    DDD (degenerative disc disease), lumbar    Degenerative disc disease    Degenerative disc disease, cervical    Degenerative disc disease, cervical    Fibroid    Fibromyalgia    GERD (gastroesophageal reflux disease)    Headache    regular   Heartburn    Hypertension    Joint pain    Localized swelling of both lower legs    Osteoarthritis    Other specified disorders of thyroid    Pre-diabetes    Rheumatoid arthritis (HCC)    Sleep apnea    mild, patient does not use mask   SOB (shortness of breath)     Surgeries: Procedure(s): ARTHROPLASTY, KNEE, TOTAL on 03/19/2024   Consultants:   Discharged Condition: Improved  Hospital Course: [name] is an 63 y.o. female who was admitted 03/19/2024 for operative treatment ofUnilateral primary osteoarthritis, right knee. Patient has severe unremitting pain that affects sleep, daily activities, and work/hobbies. After pre-op clearance the patient was taken to the operating room on 03/19/2024 and underwent  Procedure(s): ARTHROPLASTY, KNEE, TOTAL.    Patient was given perioperative antibiotics:  Anti-infectives (From admission, onward)    Start     Dose/Rate Route Frequency Ordered Stop   03/19/24 0900  ceFAZolin (ANCEF) IVPB 3g/150 mL premix        3 g 300 mL/hr over 30 Minutes Intravenous On call to O.R. 03/19/24 4098 03/19/24 1155        Patient was given sequential compression devices, early ambulation, and chemoprophylaxis to prevent DVT.  Patient benefited maximally from hospital stay.  She did require an AFO on her right leg  due to foot drop post-operatively.  Recent vital signs: Patient Vitals for the past 24 hrs:  BP Temp Temp src Pulse Resp SpO2  03/23/24 1340 (!) 108/59 97.7 F (36.5 C) Oral 66 16 98 %  03/23/24 1317 120/80 -- -- 64 -- --  03/23/24 0710 112/63 98.1 F (36.7 C) Oral 73 18 99 %  03/22/24 2156 131/68 97.8 F (36.6 C) Oral 78 18 100 %     Recent laboratory studies:  Recent Labs    03/22/24 0739  WBC 10.4  HGB 10.3*  HCT 32.3*  PLT 346  NA 133*  K 3.3*  CL 97*  CO2 27  BUN 26*  CREATININE 1.09*  GLUCOSE 118*  CALCIUM 8.4*     Discharge Medications:   Allergies as of 03/23/2024   No Known Allergies      Medication List     TAKE these medications    albuterol (2.5 MG/3ML) 0.083% nebulizer solution Commonly known as: PROVENTIL Take 2.5 mg by nebulization every 6 (six) hours as needed for wheezing or shortness of breath.   albuterol 108 (90 Base) MCG/ACT inhaler Commonly known as: VENTOLIN HFA Inhale 2 puffs into the lungs every 6 (six) hours as needed for shortness of breath.   ALPRAZolam 1 MG tablet Commonly known as: XANAX Take 1 mg by mouth daily as needed for anxiety.   aspirin 81 MG chewable tablet Chew 1  tablet (81 mg total) by mouth 2 (two) times daily.   atenolol 50 MG tablet Commonly known as: TENORMIN Take 50 mg by mouth daily.   BD Pen Needle Nano 2nd Gen 32G X 4 MM Misc Generic drug: Insulin Pen Needle 1 Package by Does not apply route 2 (two) times daily.   cetirizine 10 MG tablet Commonly known as: ZYRTEC Take 10 mg by mouth daily.   chlorhexidine 4 % external liquid Commonly known as: HIBICLENS Apply 15 mLs (1 Application total) topically as directed for 30 doses. Use as directed daily for 5 days every other week for 6 weeks.   cyclobenzaprine 10 MG tablet Commonly known as: FLEXERIL Take 1 tablet (10 mg total) by mouth 3 (three) times daily as needed for muscle spasms.   Flovent HFA 220 MCG/ACT inhaler Generic drug:  fluticasone Inhale 2 puffs into the lungs 2 (two) times daily as needed (shortness of breath).   fluticasone 50 MCG/ACT nasal spray Commonly known as: FLONASE Place 2 sprays into both nostrils daily as needed for allergies.   furosemide 20 MG tablet Commonly known as: LASIX Take 20 mg by mouth daily as needed for fluid or edema.   ibuprofen 800 MG tablet Commonly known as: ADVIL Take 800 mg by mouth 3 (three) times daily as needed for moderate pain.   losartan-hydrochlorothiazide 100-25 MG tablet Commonly known as: HYZAAR Take 1 tablet by mouth daily.   meclizine 25 MG tablet Commonly known as: ANTIVERT Take 1 tablet (25 mg total) by mouth 3 (three) times daily as needed for dizziness.   mupirocin ointment 2 % Commonly known as: BACTROBAN Place 1 Application into the nose 2 (two) times daily for 60 doses. Use as directed 2 times daily for 5 days every other week for 6 weeks.   Norel AD 4-10-325 MG Tabs Generic drug: Chlorphen-PE-Acetaminophen Take 1 tablet by mouth 2 (two) times daily as needed (allergies).   Oxycodone HCl 20 MG Tabs TAKE 1 TABLET BY MOUTH EVERY 4-6 HOURS (MAX OF 5 PER DAY) What changed: Another medication with the same name was added. Make sure you understand how and when to take each.   oxyCODONE 5 MG immediate release tablet Commonly known as: Oxy IR/ROXICODONE Take 1-2 tablets (5-10 mg total) by mouth every 6 (six) hours as needed for moderate pain (pain score 4-6) (pain score 4-6). What changed: You were already taking a medication with the same name, and this prescription was added. Make sure you understand how and when to take each.   Ozempic (0.25 or 0.5 MG/DOSE) 2 MG/1.5ML Sopn Generic drug: Semaglutide(0.25 or 0.5MG /DOS) INJECT 0.25MG  INTO THE SKIN ONE TIME PER WEEK   Ozempic (2 MG/DOSE) 8 MG/3ML Sopn Generic drug: Semaglutide (2 MG/DOSE) Inject 2 mg into the skin once a week.   potassium chloride SA 20 MEQ tablet Commonly known as: KLOR-CON  M Take 2 tablets (40 mEq total) by mouth daily for 7 days.   tiZANidine 4 MG tablet Commonly known as: ZANAFLEX Take 4 mg by mouth 2 (two) times daily as needed.   Vitamin D3 125 MCG (5000 UT) Caps Take 1 capsule (5,000 Units total) by mouth daily.               Durable Medical Equipment  (From admission, onward)           Start     Ordered   03/22/24 1253  For home use only DME Hospital bed  Once  Question Answer Comment  Length of Need 6 Months   Patient has (list medical condition): post right total knee replacement with foot drop; morbid obesity   Bed type Semi-electric      03/22/24 1253   03/19/24 1546  DME 3 n 1  Once        03/19/24 1545   03/19/24 1546  DME Walker rolling  Once       Question Answer Comment  Walker: With 5 Inch Wheels   Patient needs a walker to treat with the following condition Status post total right knee replacement      03/19/24 1545            Diagnostic Studies: DG Knee Right Port Result Date: 03/19/2024 CLINICAL DATA:  Status post right knee replacement. EXAM: PORTABLE RIGHT KNEE - 1-2 VIEW COMPARISON:  None Available. FINDINGS: Right knee arthroplasty in expected alignment. No periprosthetic lucency or fracture. There has been patellar resurfacing. Recent postsurgical change includes air and edema in the soft tissues and joint space. Anterior skin staples in place. IMPRESSION: Right knee arthroplasty without immediate postoperative complication. Electronically Signed   By: Narda Rutherford M.D.   On: 03/19/2024 16:08    Disposition: Discharge disposition: 01-Home or Self Care          Follow-up Information     [name], MD Follow up in 2 week(s).   Specialty: Orthopedic Surgery Contact information: [address] [place] [address] [phone]         [facility] Follow up.   Specialty: Home Health Services Why: [facility] will be providing your physical therapy  needs. Contact information: [address] [place] [address] [phone]         [facility] Follow up.   Why: Levan Hurst [facility] Contact information: [address] [place], [place] [address]   Phone: [phone]                 Signed: [name] 03/23/2024, 2:34 PM

## 2024-03-23 NOTE — Progress Notes (Signed)
 Patient ID: [name], female   DOB: 15-Jul-1961, 63 y.o.   MRN: [id number] The patient is awake and alert this morning.  Her vital signs are stable and her blood pressure is improved.  The goal is to potentially discharge her to home later this afternoon.  We are awaiting her receiving a right lower extremity AFO due to her foot drop and making sure that other DME has been established for her including a hospital bed.  We are operating at [facility] all morning so we will check on her this afternoon after our surgeries.

## 2024-03-23 NOTE — Progress Notes (Signed)
 Physical Therapy Treatment Patient Details Name: [name] MRN: [id number] DOB: [birth year] Today's Date: 03/23/2024   History of Present Illness Pt is 63 yo female s/p R TKA on 03/19/24.  Pt with hx including but not limited to L TKA 2018 , HTN, HCL, back pain, DDD, fibromyalgia, RA, ACDF    PT Comments  Pt using foot up brace today to ambulate (did not have shoes for AFO that [employer] left), helped some with foot drop but pt still compensating with hip flexion.  She does continue to fatigue easily - discussed having chair/rollator for rest break when walking into apartment.  She was able to do 1 step backward to simulate curb entry to home.  Pt continuing to progress - will follow-up this afternoon but pt is hopeful to return home today.    If plan is discharge home, recommend the following: Assistance with cooking/housework;Assist for transportation;Help with stairs or ramp for entrance;A little help with walking and/or transfers;A little help with bathing/dressing/bathroom   Can travel by private vehicle        Equipment Recommendations  Hospital bed;BSC/3in1;Rolling walker (2 wheels)    Recommendations for Other Services       Precautions / Restrictions Precautions Precautions: Fall;Knee Required Braces or Orthoses: Knee Immobilizer - Right;Other Brace Knee Immobilizer - Right: Discontinue once straight leg raise with < 10 degree lag Other Brace: AFO for R LE with mobility Restrictions Weight Bearing Restrictions Per Provider Order: No RLE Weight Bearing Per Provider Order: Weight bearing as tolerated     Mobility  Bed Mobility Overal bed mobility: Needs Assistance Bed Mobility: Supine to Sit     Supine to sit: Min assist     General bed mobility comments: Min A for R LE    Transfers Overall transfer level: Needs assistance Equipment used: Rolling walker (2 wheels) Transfers: Sit to/from Stand Sit to Stand: Min assist, From elevated surface           General  transfer comment: Increased time and min A to rise; min A to extend R leg with sitting    Ambulation/Gait Ambulation/Gait assistance: Contact guard assist Gait Distance (Feet): 35 Feet Assistive device: Rolling walker (2 wheels) Gait Pattern/deviations: Step-to pattern, Decreased stride length, Decreased weight shift to right, Decreased dorsiflexion - right Gait velocity: decreased but functional.     General Gait Details: Pt did not have tennis shoes so [employer] left AFO for home to use with tennis shoe but also provided foot up brace to use in the meantime.  Pt ambulated with foot up brace on that assisted some with dorsiflexion but pt still having to compensate with increased hip flexion.  She reports hip is sore (provided hot pack at end of session).  Fatigues easily but is steady with RW.  Discussed having chair or rollator set up on her walk into apartment so that she could take a break.   Stairs Stairs: Yes Stairs assistance: Contact guard assist, +2 safety/equipment Stair Management: Step to pattern, Backwards, With walker Number of Stairs: 1 General stair comments: Pt only has curb to enter home.  Performed 6" platform backward with RW.  PT blocked knee and tech guarded from behind.  Pt able to perform without buckling and only needed CGA.  Daughter present and observed.   Wheelchair Mobility     Tilt Bed    Modified Rankin (Stroke Patients Only)       Balance Overall balance assessment: Needs assistance Sitting-balance support: Feet supported Sitting balance-Leahy Scale:  Good     Standing balance support: Bilateral upper extremity supported, Reliant on assistive device for balance Standing balance-Leahy Scale: Poor Standing balance comment: steady but needs RW                            Communication Communication Communication: No apparent difficulties  Cognition Arousal: Alert Behavior During Therapy: WFL for tasks assessed/performed   PT -  Cognitive impairments: No apparent impairments                       PT - Cognition Comments: daughter present        Cueing    Exercises Total Joint Exercises Ankle Circles/Pumps: AROM, Left, PROM, Right, Seated, 15 reps (PROM on R with heel cord stretch 5 sec hold each rep) Quad Sets: AROM, Both, 10 reps, Supine Hip ABduction/ADduction: AAROM, Right, Supine, 5 reps Long Arc Quad: Right, 10 reps, Seated, AROM Knee Flexion: AAROM, Right, 10 reps, Seated Goniometric ROM: R knee ~5 to 60    General Comments General comments (skin integrity, edema, etc.): RLE edema and post op knee incision intact      Pertinent Vitals/Pain Pain Assessment Pain Assessment: 0-10 Pain Score: 3  Pain Location: R knee and hip Pain Descriptors / Indicators: Aching, Discomfort, Dull, Sore Pain Intervention(s): Limited activity within patient's tolerance, Monitored during session, Premedicated before session, Repositioned, Ice applied, Heat applied (ice to knee; heat to hip)    Home Living Family/patient expects to be discharged to:: Private residence Living Arrangements: Children Available Help at Discharge: Family;Available 24 hours/day Type of Home: Apartment Home Access: Stairs to enter   [employer] of Steps: step up on curb   Home Layout: One level Home Equipment: Cane - single point;Rollator (4 wheels) Additional Comments: needs shower chair    Prior Function            PT Goals (current goals can now be found in the care plan section) Progress towards PT goals: Progressing toward goals    Frequency    7X/week      PT Plan      Co-evaluation              AM-PAC PT "6 Clicks" Mobility   Outcome Measure  Help needed turning from your back to your side while in a flat bed without using bedrails?: A Little Help needed moving from lying on your back to sitting on the side of a flat bed without using bedrails?: A Little Help needed moving to and from  a bed to a chair (including a wheelchair)?: A Little Help needed standing up from a chair using your arms (e.g., wheelchair or bedside chair)?: A Little Help needed to walk in hospital room?: A Little Help needed climbing 3-5 steps with a railing? : A Little 6 Click Score: 18    End of Session Equipment Utilized During Treatment: Gait belt Activity Tolerance: Patient tolerated treatment well Patient left: with call bell/phone within reach;with family/visitor present;in chair Nurse Communication: Mobility status PT Visit Diagnosis: Other abnormalities of gait and mobility (R26.89);Muscle weakness (generalized) (M62.81)     Time: 1010-1036 PT Time Calculation (min) (ACUTE ONLY): 26 min  Charges:    $Gait Training: 8-22 mins $Therapeutic Exercise: 8-22 mins PT General Charges $$ ACUTE PT VISIT: 1 Visit                     Jacilyn Sanpedro, PT Acute  Rehab Services [facility] [phone]    Rayetta Humphrey 03/23/2024, 11:22 AM

## 2024-03-23 NOTE — Progress Notes (Signed)
 Physical Therapy Treatment Patient Details Name: [name] MRN: [id number] DOB: 12-04-1961 Today's Date: 03/23/2024   History of Present Illness Pt is 63 yo female s/p R TKA on 03/19/24.  Pt with hx including but not limited to L TKA 2018 , HTN, HCL, back pain, DDD, fibromyalgia, RA, ACDF    PT Comments  Pt is POD # 4 and is progressing with PT.  She continues to have foot drop on R (expected for several weeks/months) but is compensating with gait pattern and using foot up brace.  Pt needs min A for transfers - daughter able to assist.  She is ambulating short distances but steady gait - discussed setting up opportunities for rest breaks when walking into apartment.  Pt has fair ROM and pain control, good quad activation and understanding of HEP and safety.  Pt will have necessary support in order to return home and plan is to continue working with HHPT.   Pt demonstrates safe gait & transfers in order to return home from PT perspective once discharged by MD.  While in hospital, will continue to benefit from PT for skilled therapy to advance mobility and exercises.       If plan is discharge home, recommend the following: Assistance with cooking/housework;Assist for transportation;Help with stairs or ramp for entrance;A little help with walking and/or transfers;A little help with bathing/dressing/bathroom   Can travel by private vehicle        Equipment Recommendations  Hospital bed;BSC/3in1;Rolling walker (2 wheels)    Recommendations for Other Services       Precautions / Restrictions Precautions Precautions: Fall;Knee Required Braces or Orthoses: Knee Immobilizer - Right;Other Brace Knee Immobilizer - Right: Discontinue once straight leg raise with < 10 degree lag Other Brace: AFO for R LE with mobility Restrictions RLE Weight Bearing Per Provider Order: Weight bearing as tolerated     Mobility  Bed Mobility Overal bed mobility: Needs Assistance Bed Mobility: Supine to Sit      Supine to sit: Supervision, HOB elevated     General bed mobility comments: Able to do with increased time without assist    Transfers Overall transfer level: Needs assistance Equipment used: Rolling walker (2 wheels) Transfers: Sit to/from Stand Sit to Stand: Min assist           General transfer comment: Increased time and min A to rise; daughter able to provide assist; pt self extending R leg to sit this afternoon    Ambulation/Gait Ambulation/Gait assistance: Contact guard assist Gait Distance (Feet): 30 Feet Assistive device: Rolling walker (2 wheels) Gait Pattern/deviations: Step-to pattern, Decreased stride length, Decreased weight shift to right, Decreased dorsiflexion - right Gait velocity: decreased but functional.     General Gait Details: Pt did not have tennis shoes so [employer] left AFO for home to use with tennis shoe but also provided foot up brace to use in the meantime.  Pt ambulated with foot up brace on that assisted some with dorsiflexion but pt still having to compensate with increased hip flexion.  She reports hip is sore (provided hot pack at end of session).  Fatigues easily but is steady with RW. Cues for posture with ambulation. Discussed having chair or rollator set up on her walk into apartment so that she could take a break.     Wheelchair Mobility     Tilt Bed    Modified Rankin (Stroke Patients Only)       Balance Overall balance assessment: Needs assistance Sitting-balance support: Feet  supported Sitting balance-Leahy Scale: Good     Standing balance support: Bilateral upper extremity supported, Reliant on assistive device for balance Standing balance-Leahy Scale: Poor Standing balance comment: steady but needs RW                            Communication    Cognition Arousal: Alert Behavior During Therapy: WFL for tasks assessed/performed   PT - Cognitive impairments: No apparent impairments                        PT - Cognition Comments: daughter present        Cueing    Exercises Total Joint Exercises Ankle Circles/Pumps: Seated, 10 reps, AROM, Left, PROM, Right (Self assist on R with gait belt; 5 sec hold) Quad Sets: AROM, Both, 10 reps, Supine Heel Slides: AAROM, Right, 5 reps Hip ABduction/ADduction: AAROM, Right, Supine, 5 reps Long Arc Quad: Right, Seated, AROM, 5 reps Knee Flexion: AAROM, Right, Seated, 5 reps Goniometric ROM: R knee 3 to 65    General Comments  Educated on safe ice use, no pivots, car transfers, resting with leg straight, and TED hose during day. Also, encouraged walking every 1-2 hours during day. Educated on HEP with focus on mobility the first weeks. Discussed doing exercises within pain control and if pain increasing could decreased ROM, reps, and stop exercises as needed. Encouraged to perform quad sets and ankle pumps (PROM on R) frequently for blood flow and to promote full knee extension.  Also educated on use of gait belt, having opportunities for rest breaks with walking, and safety. Pt with c/o pain/soreness in R hip/hip flexors - discussed likely just due to soreness from surgery and having to compensate for foot drop.  Educated would keep monitoring.  Pt using foot up brace today; encouraged working more with HHPT in actual AFO once has tennis shoes.        Pertinent Vitals/Pain Pain Assessment Pain Assessment: 0-10 Pain Score: 6  Pain Location: R knee and hip Pain Descriptors / Indicators: Aching, Discomfort, Dull, Sore Pain Intervention(s): Limited activity within patient's tolerance, Monitored during session, Premedicated before session, Repositioned (heat to hip; ice to knee)    Home Living                          Prior Function            PT Goals (current goals can now be found in the care plan section) Progress towards PT goals: Progressing toward goals    Frequency    7X/week      PT Plan      Co-evaluation               AM-PAC PT "6 Clicks" Mobility   Outcome Measure  Help needed turning from your back to your side while in a flat bed without using bedrails?: A Little Help needed moving from lying on your back to sitting on the side of a flat bed without using bedrails?: A Little Help needed moving to and from a bed to a chair (including a wheelchair)?: A Little Help needed standing up from a chair using your arms (e.g., wheelchair or bedside chair)?: A Little Help needed to walk in hospital room?: A Little Help needed climbing 3-5 steps with a railing? : A Little 6 Click Score: 18    End of Session Equipment  Utilized During Treatment: Gait belt Activity Tolerance: Patient tolerated treatment well Patient left: with call bell/phone within reach;with family/visitor present;in chair Nurse Communication: Mobility status PT Visit Diagnosis: Other abnormalities of gait and mobility (R26.89);Muscle weakness (generalized) (M62.81)     Time: 1914-7829 PT Time Calculation (min) (ACUTE ONLY): 34 min  Charges:    $Gait Training: 8-22 mins $Therapeutic Exercise: 8-22 mins PT General Charges $$ ACUTE PT VISIT: 1 Visit                     Anise Salvo, PT Acute Rehab [facility] [phone]    Rayetta Humphrey 03/23/2024, 3:16 PM

## 2024-03-23 NOTE — Progress Notes (Signed)
 Patient ambulating to the bathroom overnight. No new drainage detected at surgical site.

## 2024-03-25 ENCOUNTER — Other Ambulatory Visit: Payer: Self-pay

## 2024-03-25 ENCOUNTER — Telehealth: Payer: Self-pay | Admitting: Orthopaedic Surgery

## 2024-03-25 ENCOUNTER — Telehealth: Payer: Self-pay

## 2024-03-25 DIAGNOSIS — G8929 Other chronic pain: Secondary | ICD-10-CM

## 2024-03-25 NOTE — Telephone Encounter (Signed)
 Patient called stating that her right foot is swollen.  Stated no redness, fever or chills.  Patient had right knee  surgery on 03/19/2024.  CB# [phone].  Please advise.

## 2024-03-25 NOTE — Telephone Encounter (Signed)
 I have no idea how or why I did this. But it is an error  She does NOT need an MRI, she just had TKA Can we cancel this?

## 2024-03-25 NOTE — Telephone Encounter (Signed)
LMOM of the below message  

## 2024-03-25 NOTE — Telephone Encounter (Signed)
 Debroah Loop (PT) from [facility] called for verbals for 2 wk 5. Debroah Loop states pt leg still alittle swollen but not alarming and  pt continues to have foot drop. [facility] secure line is [phone].

## 2024-03-26 ENCOUNTER — Telehealth: Payer: Self-pay

## 2024-03-26 ENCOUNTER — Other Ambulatory Visit: Payer: Self-pay | Admitting: Orthopaedic Surgery

## 2024-03-26 MED ORDER — HYDROMORPHONE HCL 4 MG PO TABS: 4.0000 mg | ORAL_TABLET | ORAL | 0 refills | Status: AC | PRN

## 2024-03-26 MED ORDER — HYDROMORPHONE HCL 4 MG PO TABS: 4.0000 mg | ORAL_TABLET | ORAL | 0 refills | Status: DC | PRN

## 2024-03-26 NOTE — Telephone Encounter (Signed)
 Patient called stating she was in pain mgmt prior to surgery and taking a higher dose oxycodone and dilaudid. States the oxycodone 5 is not controlling her pain. She is asking for Dilaudid  [facility] [address]

## 2024-03-26 NOTE — Telephone Encounter (Signed)
 Can we sent that dilaudid to [facility]; [facility]

## 2024-03-29 ENCOUNTER — Ambulatory Visit (HOSPITAL_COMMUNITY)

## 2024-03-29 ENCOUNTER — Telehealth: Payer: Self-pay | Admitting: Orthopaedic Surgery

## 2024-03-29 NOTE — Telephone Encounter (Signed)
 Patient called and ask if she could get a prescription for a wheelchair because she having a hard time walking. CB#[phone]

## 2024-04-01 ENCOUNTER — Encounter: Payer: Self-pay | Admitting: Orthopaedic Surgery

## 2024-04-01 ENCOUNTER — Ambulatory Visit (INDEPENDENT_AMBULATORY_CARE_PROVIDER_SITE_OTHER): Payer: 59 | Admitting: Orthopaedic Surgery

## 2024-04-01 ENCOUNTER — Other Ambulatory Visit: Payer: Self-pay

## 2024-04-01 DIAGNOSIS — Z96651 Presence of right artificial knee joint: Secondary | ICD-10-CM

## 2024-04-01 NOTE — Progress Notes (Signed)
 The patient is a 63 year old patient well-known to us .  She is 2 weeks out from a right total knee replacement to treat severe right knee arthritis.  She is someone who is morbidly obese with a BMI of around 46.  She had severe end-stage arthritis of her right knee and we successfully replaced her left knee many years ago.  Postoperatively she does have foot drop.  Her surgery was very difficult given her obesity and certainly we likely stretched on some nerves leading to the foot drop.  I did let her know that usually these will resolve but it can take up to a year to resolve.  She does have AFO.  Sutures and staples been removed from her right knee incision.  The very bottom of the incision has a little area that is raw and unguinal have mupirocin only placed on this area daily.  The rest the incision looks good and Steri-Strips have been applied.  She has decent range of motion of her right knee for this first visit.  At this point we need to set her up for outpatient physical therapy upstairs for range of motion of her right knee and working on balance and coordination.  She will continue her AFO until her foot drop resolves.  I would like to see her back in just 2 weeks for a wound check of her incision.  She can stop any blood thinners from my standpoint as well.  Of note, she is in pain management and states that they will take over her pain medicine needs at this standpoint.

## 2024-04-05 NOTE — Therapy (Signed)
 OUTPATIENT PHYSICAL THERAPY LOWER EXTREMITY EVALUATION   Patient Name: [name] MRN: [id number] DOB:[birth year], 63 y.o., female Today's Date: 04/06/2024  END OF SESSION:  PT End of Session - 04/06/24 1723     Visit Number 1    Number of Visits 17    Date for PT Re-Evaluation 06/11/24    Authorization - Number of Visits 27    PT Start Time 1105    PT Stop Time 1150    PT Time Calculation (min) 45 min    Activity Tolerance Patient tolerated treatment well    Behavior During Therapy WFL for tasks assessed/performed             Past Medical History:  Diagnosis Date   Anxiety    Asthma    Back pain    Chronic pain    DDD (degenerative disc disease), lumbar    Degenerative disc disease    Degenerative disc disease, cervical    Degenerative disc disease, cervical    Fibroid    Fibromyalgia    GERD (gastroesophageal reflux disease)    Headache    regular   Heartburn    Hypertension    Joint pain    Localized swelling of both lower legs    Osteoarthritis    Other specified disorders of thyroid     Pre-diabetes    Rheumatoid arthritis (HCC)    Sleep apnea    mild, patient does not use mask   SOB (shortness of breath)    Past Surgical History:  Procedure Laterality Date   ANTERIOR CERVICAL DECOMP/DISCECTOMY FUSION  1998   C4-7   NOVASURE ABLATION     TOTAL KNEE ARTHROPLASTY Left 01/17/2017   Procedure: LEFT TOTAL KNEE ARTHROPLASTY;  Surgeon: Arnie Lao, MD;  Location: [facility];  Service: Orthopedics;  Laterality: Left;   TOTAL KNEE ARTHROPLASTY Right 03/19/2024   Procedure: ARTHROPLASTY, KNEE, TOTAL;  Surgeon: Arnie Lao, MD;  Location: [facility];  Service: Orthopedics;  Laterality: Right;   TUBAL LIGATION     Patient Active Problem List   Diagnosis Date Noted   Status post total right knee replacement 03/19/2024   Visit for routine gyn exam 02/25/2022   Vitamin D  deficiency 02/21/2021   Essential hypertension 02/21/2021   Pure  hypercholesterolemia 02/21/2021   Class 3 severe obesity with serious comorbidity and body mass index (BMI) of 50.0 to 59.9 in adult [facility]) 06/13/2020   Prediabetes 06/13/2020   Leg pain 06/13/2020   Status post total left knee replacement 01/17/2017   Degenerative disc disease, cervical     PCP: Charle Congo, MD   REFERRING PROVIDER: Arnie Lao, MD  REFERRING DIAG: [phone] (ICD-10-CM) - Status post total right knee replacement   THERAPY DIAG:  Acute pain of right knee  Muscle weakness (generalized)  Stiffness of right knee, not elsewhere classified  Difficulty in walking, not elsewhere classified  Rationale for Evaluation and Treatment: Rehabilitation  ONSET DATE: 03/19/24 DOS  SUBJECTIVE:   SUBJECTIVE STATEMENT: Pt reports she is having significant pain with the R knee and she developed R drop foot after the knee surgery . Pt has in in-home assistance 3 hours a day and completed HHPT yesterday.  PERTINENT HISTORY: DDD- cervical and lumbar; L TKA 2018, High BMI  PAIN:  Are you having pain?Yes: NPRS scale: 8/10. Pain range the week before start of PT: 5-8/10 Pain location: R knee Pain description: ache Aggravating factors: walking Relieving factors: ice machine, pain medication  PRECAUTIONS: None  RED FLAGS: None   WEIGHT BEARING RESTRICTIONS: None; WBAT  FALLS:  Has patient fallen in last 6 months? No  LIVING ENVIRONMENT: Lives with: lives with their family Lives in: House/apartment Stairs: 0 steps Has following equipment at home: Single point cane, Environmental consultant - 2 wheeled, Environmental consultant - 4 wheeled, shower chair, bed side commode, and Grab bars  OCCUPATION: Not working  PLOF: Independent with household mobility with device  PATIENT GOALS: Good use of my R knee  NEXT MD VISIT: 04/21/24 Dr. Lucienne Ryder  OBJECTIVE:  Note: Objective measures were completed at Evaluation unless otherwise noted.  PATIENT SURVEYS:  LEFS 15/80=19%  COGNITION: Overall  cognitive status: Within functional limits for tasks assessed     SENSATION: Light touch: Impaired R foot  EDEMA:   Swelling present  MUSCLE LENGTH: Hamstrings: Right NT deg; Left NT deg Andy Bannister test: Right NT deg; Left NT deg  POSTURE: increased lumbar lordosis and flexed trunk   PALPATION: TTP R lateral knee  LOWER EXTREMITY ROM:  Active ROM Right eval Left eval  Hip flexion    Hip extension    Hip abduction    Hip adduction    Hip internal rotation    Hip external rotation    Knee flexion 80   Knee extension 15 lacking   Ankle dorsiflexion    Ankle plantarflexion    Ankle inversion    Ankle eversion     (Blank rows = not tested)  LOWER EXTREMITY MMT:  MMT Right eval Left eval  Hip flexion 2+   Hip extension 2+   Hip abduction    Hip adduction 2+   Hip internal rotation    Hip external rotation 2+   Knee flexion 3   Knee extension 3   Ankle dorsiflexion Drop foot   Ankle plantarflexion    Ankle inversion    Ankle eversion     (Blank rows = not tested)  LOWER EXTREMITY SPECIAL TESTS:  NT  FUNCTIONAL TESTS:  5 times sit to stand: TBA 2 minute walk test: TBA  On her 2nd visit GAIT: Distance walked: 100' Assistive device utilized: Environmental consultant - 2 wheeled Level of assistance: Modified independence Comments: Antalgic gait pattern over R LE, slow pace, R drop foot, wears drop foot brace                                                                                                                                TREATMENT DATE:  [facility] Adult PT Treatment:                                                DATE: 04/06/24 Self Care: Pt self care re: concentrating on quad sets, heel slides, and SAQ with her HEP; more frequent use of the ice machine and elevation, at least 3x  day for pain and swelling management   Therapeutic Exercise: Quad sets x4 Supine heel slide x5 Supine SAQ x5 Seated heel slides x5  PATIENT EDUCATION:  Education details: Eval findings,  POC, HEP, self care  Person educated: Patient Education method: Explanation, Demonstration, Tactile cues, Verbal cues, and Handouts Education comprehension: verbalized understanding, returned demonstration, verbal cues required, and tactile cues required  HOME EXERCISE PROGRAM: TBD  ASSESSMENT:  CLINICAL IMPRESSION: Patient is a 63 y.o. female who was seen today for physical therapy evaluation and treatment for Z96.651 (ICD-10-CM) - Status post total right knee replacement. Pt presents when min decreased AROM of the R knee for flexion and extension, decreased R LE strength, and walking with an antalgic gait pattern/slow pace. Following surgery, pt developed drop foot and uses a drop foot brace with ambulation. Pt will benefit from skilled PT 2w8 to address impairments to optimize R knee function for improved mobility..   OBJECTIVE IMPAIRMENTS: decreased activity tolerance, decreased balance, difficulty walking, decreased ROM, decreased strength, increased edema, obesity, and pain.   ACTIVITY LIMITATIONS: carrying, lifting, bending, sitting, standing, squatting, sleeping, stairs, transfers, bed mobility, bathing, locomotion level, and caring for others  PARTICIPATION LIMITATIONS: meal prep, cleaning, laundry, driving, shopping, and community activity  PERSONAL FACTORS: Fitness, Past/current experiences, Time since onset of injury/illness/exacerbation, and 3+ comorbidities: DDD- cervical and lumbar; L TKA 2018, High BMI  are also affecting patient's functional outcome.   REHAB POTENTIAL: Good  CLINICAL DECISION MAKING: Evolving/moderate complexity  EVALUATION COMPLEXITY: Moderate   GOALS:  SHORT TERM GOALS: Target date: 04/23/24 Pt will be Ind in an initial HEP  Baseline: Goal status: INITIAL  2.  Increase R knee AROM to 7-100d Baseline: 15-80  Goal status: INITIAL  LONG TERM GOALS: Target date: 06/11/24  Pt will be Ind in a final HEP to maintain achieved LOF Baseline:  Goal  status: INITIAL  2.  Increase R knee AROM  to 0-115d for functional mobility sitting and asc/dsc steps Baseline:  Goal status: INITIAL  3.  Improve 5xSTS by MCID of 5" and c RW by MCID of 85ft as indication of improved functional mobility  Baseline: TBA on 2nd viist Goal status: INITIAL  4.  Increase R knee strength to 4/5 and R hip to 3/5 for appropriate functional use with community ambulation Baseline:  Goal status: INITIAL  5.  Pt will be Ind with ambulation c a RW for 370' for community ambulation. With a LRAD as R drop foot improves. Baseline:  Goal status: INITIAL  6.  Pt will be able to asc/dsc 5 steps c assist c RW for community mobility Baseline:  Goal status: INITIAL   PLAN:  PT FREQUENCY: 2x/week  PT DURATION: 8 weeks  PLANNED INTERVENTIONS: 97164- PT Re-evaluation, 97110-Therapeutic exercises, 97530- Therapeutic activity, [id number]- Neuromuscular re-education, 97535- Self Care, 16109- Manual therapy, [id number]- Gait training, [phone]- Electrical stimulation (unattended), 97016- Vasopneumatic device, Patient/Family education, Balance training, Stair training, Taping, Dry Needling, Joint mobilization, Cryotherapy, and Moist heat  PLAN FOR NEXT SESSION: Assess 5xSTS and c RW on 2nd visit; assess response to HEP; progress therex as indicated; use of modalities, manual therapy; and TPDN as indicated.    Wendy Hoback MS, PT 04/06/24 6:03 PM

## 2024-04-06 ENCOUNTER — Ambulatory Visit: Attending: Orthopaedic Surgery

## 2024-04-06 DIAGNOSIS — M25661 Stiffness of right knee, not elsewhere classified: Secondary | ICD-10-CM | POA: Diagnosis present

## 2024-04-06 DIAGNOSIS — Z96651 Presence of right artificial knee joint: Secondary | ICD-10-CM | POA: Diagnosis not present

## 2024-04-06 DIAGNOSIS — M25561 Pain in right knee: Secondary | ICD-10-CM | POA: Insufficient documentation

## 2024-04-06 DIAGNOSIS — R262 Difficulty in walking, not elsewhere classified: Secondary | ICD-10-CM | POA: Diagnosis present

## 2024-04-06 DIAGNOSIS — M6281 Muscle weakness (generalized): Secondary | ICD-10-CM | POA: Insufficient documentation

## 2024-04-08 ENCOUNTER — Encounter: Payer: Self-pay | Admitting: Physical Therapy

## 2024-04-08 ENCOUNTER — Ambulatory Visit: Admitting: Physical Therapy

## 2024-04-08 DIAGNOSIS — M25561 Pain in right knee: Secondary | ICD-10-CM | POA: Diagnosis not present

## 2024-04-08 DIAGNOSIS — M25661 Stiffness of right knee, not elsewhere classified: Secondary | ICD-10-CM

## 2024-04-08 DIAGNOSIS — R262 Difficulty in walking, not elsewhere classified: Secondary | ICD-10-CM

## 2024-04-08 DIAGNOSIS — M6281 Muscle weakness (generalized): Secondary | ICD-10-CM

## 2024-04-08 NOTE — Therapy (Signed)
 OUTPATIENT PHYSICAL THERAPY LOWER EXTREMITY NOTE   Patient Name: [name] MRN: [id number] DOB:1961/11/28, 63 y.o., female Today's Date: 04/08/2024  END OF SESSION:  PT End of Session - 04/08/24 1149     Visit Number 2    Number of Visits 17    Date for PT Re-Evaluation 06/11/24    Authorization Type [employer], M[employer]    Authorization - Number of Visits 27    PT Start Time 1145    PT Stop Time 1235    PT Time Calculation (min) 50 min    Activity Tolerance Patient tolerated treatment well    Behavior During Therapy WFL for tasks assessed/performed              Past Medical History:  Diagnosis Date   Anxiety    Asthma    Back pain    Chronic pain    DDD (degenerative disc disease), lumbar    Degenerative disc disease    Degenerative disc disease, cervical    Degenerative disc disease, cervical    Fibroid    Fibromyalgia    GERD (gastroesophageal reflux disease)    Headache    regular   Heartburn    Hypertension    Joint pain    Localized swelling of both lower legs    Osteoarthritis    Other specified disorders of thyroid     Pre-diabetes    Rheumatoid arthritis (HCC)    Sleep apnea    mild, patient does not use mask   SOB (shortness of breath)    Past Surgical History:  Procedure Laterality Date   ANTERIOR CERVICAL DECOMP/DISCECTOMY FUSION  1998   C4-7   NOVASURE ABLATION     TOTAL KNEE ARTHROPLASTY Left 01/17/2017   Procedure: LEFT TOTAL KNEE ARTHROPLASTY;  Surgeon: Arnie Lao, MD;  Location: [facility];  Service: Orthopedics;  Laterality: Left;   TOTAL KNEE ARTHROPLASTY Right 03/19/2024   Procedure: ARTHROPLASTY, KNEE, TOTAL;  Surgeon: Arnie Lao, MD;  Location: [facility];  Service: Orthopedics;  Laterality: Right;   TUBAL LIGATION     Patient Active Problem List   Diagnosis Date Noted   Status post total right knee replacement 03/19/2024   Visit for routine gyn exam 02/25/2022   Vitamin D  deficiency 02/21/2021   Essential  hypertension 02/21/2021   Pure hypercholesterolemia 02/21/2021   Class 3 severe obesity with serious comorbidity and body mass index (BMI) of 50.0 to 59.9 in adult [facility]) 06/13/2020   Prediabetes 06/13/2020   Leg pain 06/13/2020   Status post total left knee replacement 01/17/2017   Degenerative disc disease, cervical     PCP: Charle Congo, MD   REFERRING PROVIDER: Arnie Lao, MD  REFERRING DIAG: [phone] (ICD-10-CM) - Status post total right knee replacement   THERAPY DIAG:  Acute pain of right knee  Muscle weakness (generalized)  Stiffness of right knee, not elsewhere classified  Difficulty in walking, not elsewhere classified  Rationale for Evaluation and Treatment: Rehabilitation  ONSET DATE: 03/19/24 DOS  SUBJECTIVE:   SUBJECTIVE STATEMENT: Pt is having significant pain in Rt knee.  No new complaints.  She has been taking her meds, ASA and wonders if she can stop taking it. Rec she ask the MD. Sees MD 04/21/24.    PERTINENT HISTORY: DDD- cervical and lumbar; L TKA 2018, High BMI  Recent Md visit: The patient is a 63 year old patient well-known to us .  She is 2 weeks out from a right total knee replacement to treat severe  right knee arthritis.  She is someone who is morbidly obese with a BMI of around 46.  She had severe end-stage arthritis of her right knee and we successfully replaced her left knee many years ago.  Postoperatively she does have foot drop.  Her surgery was very difficult given her obesity and certainly we likely stretched on some nerves leading to the foot drop.  I did let her know that usually these will resolve but it can take up to a year to resolve.  She does have AFO.   Sutures and staples been removed from her right knee incision.  The very bottom of the incision has a little area that is raw and unguinal have mupirocin  only placed on this area daily.  The rest the incision looks good and Steri-Strips have been applied.  She has decent  range of motion of her right knee for this first visit.   At this point we need to set her up for outpatient physical therapy upstairs for range of motion of her right knee and working on balance and coordination.  She will continue her AFO until her foot drop resolves.  I would like to see her back in just 2 weeks for a wound check of her incision.  She can stop any blood thinners from my standpoint as well.   Of note, she is in pain management and states that they will take over her pain medicine needs at this standpoint.   PAIN:  Are you having pain?Yes: NPRS scale: 8/10. Pain range the week before start of PT: 5-8/10 Pain location: R knee Pain description: ache Aggravating factors: walking Relieving factors: ice machine, pain medication  PRECAUTIONS: None  RED FLAGS: None   WEIGHT BEARING RESTRICTIONS: None; WBAT  FALLS:  Has patient fallen in last 6 months? No  LIVING ENVIRONMENT: Lives with: lives with their family Lives in: House/apartment Stairs: 0 steps Has following equipment at home: Single point cane, Environmental consultant - 2 wheeled, Environmental consultant - 4 wheeled, shower chair, bed side commode, and Grab bars  OCCUPATION: Not working  PLOF: Independent with household mobility with device  PATIENT GOALS: Good use of my R knee  NEXT MD VISIT: 04/21/24 Dr. Lucienne Ryder  OBJECTIVE:  Note: Objective measures were completed at Evaluation unless otherwise noted.  PATIENT SURVEYS:  LEFS 15/80=19%  COGNITION: Overall cognitive status: Within functional limits for tasks assessed     SENSATION: Light touch: Impaired R foot  EDEMA:   Swelling present  MUSCLE LENGTH: Hamstrings: Right NT deg; Left NT deg Andy Bannister test: Right NT deg; Left NT deg  POSTURE: increased lumbar lordosis and flexed trunk   PALPATION: TTP R lateral knee  LOWER EXTREMITY ROM:  Active ROM Right eval Left eval  Hip flexion    Hip extension    Hip abduction    Hip adduction    Hip internal rotation    Hip  external rotation    Knee flexion 80   Knee extension 15 lacking   Ankle dorsiflexion    Ankle plantarflexion    Ankle inversion    Ankle eversion     (Blank rows = not tested)  LOWER EXTREMITY MMT:  MMT Right eval Left eval  Hip flexion 2+   Hip extension 2+   Hip abduction    Hip adduction 2+   Hip internal rotation    Hip external rotation 2+   Knee flexion 3   Knee extension 3   Ankle dorsiflexion Drop foot   Ankle  plantarflexion    Ankle inversion    Ankle eversion     (Blank rows = not tested)  LOWER EXTREMITY SPECIAL TESTS:  NT  FUNCTIONAL TESTS:  04/08/24: 5 times sit to stand: 24.76 sec used hands to pull up to walker  2 minute walk test: 81 feet, with RW   On her 2nd visit GAIT: Distance walked: 100' Assistive device utilized: Environmental consultant - 2 wheeled Level of assistance: Modified independence Comments: Antalgic gait pattern over R LE, slow pace, R drop foot, wears drop foot brace                                                                                                                                TREATMENT DATE:    [facility] Adult PT Treatment:                                                DATE: 04/08/24 Therapeutic Exercise: Supine hooklying SAQ x 2 x 10  Quad set towel under knee x 2 x 10  SLS to tolerance x 10  Heel slides without sheet  AAROM flexion using sheet : supine and then in sitting ext/flexion  Sit to stand x 5  2 min walk test  81 feet with RW then walking back after a short rest  Supine glute bridge x 10 legs on bolster  Knee extension thigh supported by towel x 10, painful  Self Care: Foot drop,  elevated RW   Modalities: Cold pack 10 min   [facility] Adult PT Treatment:                                                DATE: 04/06/24 Self Care: Pt self care re: concentrating on quad sets, heel slides, and SAQ with her HEP; more frequent use of the ice machine and elevation, at least 3x day for pain and swelling management   Therapeutic  Exercise: Quad sets x4 Supine heel slide x5 Supine SAQ x5 Seated heel slides x5  PATIENT EDUCATION:  Education details: Eval findings, POC, HEP, self care  Person educated: Patient Education method: Explanation, Demonstration, Tactile cues, Verbal cues, and Handouts Education comprehension: verbalized understanding, returned demonstration, verbal cues required, and tactile cues required  HOME EXERCISE PROGRAM: TBD- to bring in next time: heel slide, quad set, Long arc and short arc quads   ASSESSMENT:  CLINICAL IMPRESSION:  Patient with continued pain and difficulty with mobility and ambulation due to Rt foot drop and Rt knee surgery.  She was able to complete functional testing with fatigue. She is certainly disappointed with her foot drop.  Discussed bracing and how she can continue to work on  improving ankle and foot function in and not opt to wear a brace at this time.  She has a 2 piece ankle brace but not sure it is helpful for her. Continue to benefit from skilled PT to address mulitple deficits.      Patient is a 63 y.o. female who was seen today for physical therapy evaluation and treatment for Z96.651 (ICD-10-CM) - Status post total right knee replacement. Pt presents when min decreased AROM of the R knee for flexion and extension, decreased R LE strength, and walking with an antalgic gait pattern/slow pace. Following surgery, pt developed drop foot and uses a drop foot brace with ambulation. Pt will benefit from skilled PT 2w8 to address impairments to optimize R knee function for improved mobility..   OBJECTIVE IMPAIRMENTS: decreased activity tolerance, decreased balance, difficulty walking, decreased ROM, decreased strength, increased edema, obesity, and pain.   ACTIVITY LIMITATIONS: carrying, lifting, bending, sitting, standing, squatting, sleeping, stairs, transfers, bed mobility, bathing, locomotion level, and caring for others  PARTICIPATION LIMITATIONS: meal prep,  cleaning, laundry, driving, shopping, and community activity  PERSONAL FACTORS: Fitness, Past/current experiences, Time since onset of injury/illness/exacerbation, and 3+ comorbidities: DDD- cervical and lumbar; L TKA 2018, High BMI  are also affecting patient's functional outcome.   REHAB POTENTIAL: Good  CLINICAL DECISION MAKING: Evolving/moderate complexity  EVALUATION COMPLEXITY: Moderate   GOALS:  SHORT TERM GOALS: Target date: 04/23/24 Pt will be Ind in an initial HEP  Baseline: Goal status: INITIAL  2.  Increase R knee AROM to 7-100d Baseline: 15-80  Goal status: INITIAL  LONG TERM GOALS: Target date: 06/11/24  Pt will be Ind in a final HEP to maintain achieved LOF Baseline:  Goal status: INITIAL  2.  Increase R knee AROM  to 0-115d for functional mobility sitting and asc/dsc steps Baseline:  Goal status: INITIAL  3.  Improve 5xSTS by MCID of 5" and c RW by MCID of 76ft as indication of improved functional mobility  Baseline: TBA on 2nd viist Goal status: INITIAL  4.  Increase R knee strength to 4/5 and R hip to 3/5 for appropriate functional use with community ambulation Baseline:  Goal status: INITIAL  5.  Pt will be Ind with ambulation c a RW for 370' for community ambulation. With a LRAD as R drop foot improves. Baseline:  Goal status: INITIAL  6.  Pt will be able to asc/dsc 5 steps c assist c RW for community mobility Baseline:  Goal status: INITIAL   PLAN:  PT FREQUENCY: 2x/week  PT DURATION: 8 weeks  PLANNED INTERVENTIONS: 97164- PT Re-evaluation, 97110-Therapeutic exercises, 97530- Therapeutic activity, 97112- Neuromuscular re-education, 97535- Self Care, 16109- Manual therapy, [phone]- Gait training, [phone]- Electrical stimulation (unattended), 97016- Vasopneumatic device, Patient/Family education, Balance training, Stair training, Taping, Dry Needling, Joint mobilization, Cryotherapy, and Moist heat  PLAN FOR NEXT SESSION: Patient to bring in  her ankle brace as well as her home exercise program to build upon continue to address activity tolerance knee active range of motion .assess response to HEP; progress therex as indicated; use of modalities, manual therapy; and TPDN as indicated.  Marci Setter, PT 04/08/24 12:39 PM Phone: [phone] Fax: [phone]

## 2024-04-09 ENCOUNTER — Telehealth: Payer: Self-pay

## 2024-04-09 ENCOUNTER — Other Ambulatory Visit: Payer: Self-pay | Admitting: Orthopaedic Surgery

## 2024-04-09 ENCOUNTER — Telehealth: Payer: Self-pay | Admitting: Orthopaedic Surgery

## 2024-04-09 ENCOUNTER — Encounter: Payer: Self-pay | Admitting: Surgical

## 2024-04-09 ENCOUNTER — Ambulatory Visit: Admitting: Surgical

## 2024-04-09 DIAGNOSIS — Z96651 Presence of right artificial knee joint: Secondary | ICD-10-CM

## 2024-04-09 MED ORDER — DOXYCYCLINE HYCLATE 100 MG PO TABS: 100.0000 mg | ORAL_TABLET | Freq: Two times a day (BID) | ORAL | 1 refills | Status: AC

## 2024-04-09 NOTE — Progress Notes (Signed)
 Post-Op Visit Note   Patient: [name]           Date of Birth: [birth year]           MRN: [id number] Visit Date: 04/09/2024 PCP: Charle Congo, MD   Assessment & Plan:  Chief Complaint:  Chief Complaint  Patient presents with   Right Knee - Wound Check    03/19/2024 Right TKA-[facility]   Visit Diagnoses:  1. Status post total right knee replacement     Plan: Patient is a 63 year old female who presents s/p right total knee arthroplasty by Dr. Lucienne Ryder that was done about 3 weeks ago.  She is doing okay overall out from her surgery.  Working well with physical therapy and has been able to reach about 85 degrees of knee flexion in therapy.  Her main concern today causing her to be added into clinic is the distal aspect of her incision that has had some persistent superficial dehiscence.  She has no fevers or chills.  Pain has been slowly improving and not substantially getting worse.  Has not really had any significant drainage aside from some scant spotting on the daily Band-Aid that she changes.  She is using Bactroban  cream over top of the distal aspect of the incision.  On exam, patient has intact quad extension.  She has extension to about 5 degrees and flexion to about 85 degrees.  Incision looks to be healing well for the vast majority the incision aside from the distal aspect which has some superficial gapping.  There is no expressible or active drainage from this area.  He does not appear to be any substantially worse compared with the photos that she shows on her phone from about a week ago.  No calf tenderness.  Negative Homans' sign.  She has no knee effusion.  She does have significant weakness with dorsiflexion with her known recent history of dropfoot postoperatively.  Plan is continue with Bactroban  cream and daily bandage changes and follow-up with Dr. Lucienne Ryder.  I will reach out to him and see if there is anything he would change based on the current appearance of this  incision which he saw recently on 4/17.    Follow-Up Instructions: No follow-ups on file.   Orders:  No orders of the defined types were placed in this encounter.  No orders of the defined types were placed in this encounter.   Imaging: No results found.  PMFS History: Patient Active Problem List   Diagnosis Date Noted   Status post total right knee replacement 03/19/2024   Visit for routine gyn exam 02/25/2022   Vitamin D  deficiency 02/21/2021   Essential hypertension 02/21/2021   Pure hypercholesterolemia 02/21/2021   Class 3 severe obesity with serious comorbidity and body mass index (BMI) of 50.0 to 59.9 in adult [facility]) 06/13/2020   Prediabetes 06/13/2020   Leg pain 06/13/2020   Status post total left knee replacement 01/17/2017   Degenerative disc disease, cervical    Past Medical History:  Diagnosis Date   Anxiety    Asthma    Back pain    Chronic pain    DDD (degenerative disc disease), lumbar    Degenerative disc disease    Degenerative disc disease, cervical    Degenerative disc disease, cervical    Fibroid    Fibromyalgia    GERD (gastroesophageal reflux disease)    Headache    regular   Heartburn    Hypertension    Joint pain  Localized swelling of both lower legs    Osteoarthritis    Other specified disorders of thyroid     Pre-diabetes    Rheumatoid arthritis (HCC)    Sleep apnea    mild, patient does not use mask   SOB (shortness of breath)     Family History  Problem Relation Age of Onset   Cancer Mother    Hypertension Mother    Eating disorder Mother    Obesity Mother    Diabetes Father    Heart disease Father    High Cholesterol Father    Kidney disease Father    Alcoholism Father    Eating disorder Father    Cancer Sister    Cancer Brother     Past Surgical History:  Procedure Laterality Date   ANTERIOR CERVICAL DECOMP/DISCECTOMY FUSION  1998   C4-7   NOVASURE ABLATION     TOTAL KNEE ARTHROPLASTY Left 01/17/2017    Procedure: LEFT TOTAL KNEE ARTHROPLASTY;  Surgeon: Arnie Lao, MD;  Location: [facility];  Service: Orthopedics;  Laterality: Left;   TOTAL KNEE ARTHROPLASTY Right 03/19/2024   Procedure: ARTHROPLASTY, KNEE, TOTAL;  Surgeon: Arnie Lao, MD;  Location: [facility];  Service: Orthopedics;  Laterality: Right;   TUBAL LIGATION     Social History   Occupational History   Occupation: n/a  Tobacco Use   Smoking status: Never   Smokeless tobacco: Never  Vaping Use   Vaping status: Never Used  Substance and Sexual Activity   Alcohol use: Not Currently   Drug use: No   Sexual activity: Yes    Birth control/protection: Surgical

## 2024-04-09 NOTE — Telephone Encounter (Signed)
 Pt called and states that she is s/p a total knee with Dr. Christiane Cowing and that she has a wound at the incision line and wants to make an appt for eval. She left this message on the triage phone. I called the pt back and it went to voicemail. I left a message asking for her to call back and confirm details and I can make appt for her today with [facility] or she said in the message she can wait until Monday. I asked her to call and ask for me. I will also send a message in my chart and to attach a picture.  Pt's number [phone]

## 2024-04-09 NOTE — Telephone Encounter (Signed)
 Pt is seeing luke today because she now has a wound concern

## 2024-04-09 NOTE — Telephone Encounter (Signed)
 Pt called back coming in today at 3 pm with [facility]

## 2024-04-09 NOTE — Telephone Encounter (Signed)
 Patient called and said even though she has pain medication she is still in pain. She stated that she didn't have this much pain before in the other knee. CB#[phone]

## 2024-04-09 NOTE — Therapy (Signed)
 OUTPATIENT PHYSICAL THERAPY LOWER EXTREMITY NOTE   Patient Name: [name] MRN: [id number] DOB:May 14, 1961, 63 y.o., female Today's Date: 04/12/2024  END OF SESSION:  PT End of Session - 04/12/24 1114     Visit Number 3    Number of Visits 17    Date for PT Re-Evaluation 06/11/24    Authorization Type [employer], MCD    Authorization - Number of Visits 27    PT Start Time 1100    PT Stop Time 1145    PT Time Calculation (min) 45 min    Activity Tolerance Patient tolerated treatment well    Behavior During Therapy WFL for tasks assessed/performed               Past Medical History:  Diagnosis Date   Anxiety    Asthma    Back pain    Chronic pain    DDD (degenerative disc disease), lumbar    Degenerative disc disease    Degenerative disc disease, cervical    Degenerative disc disease, cervical    Fibroid    Fibromyalgia    GERD (gastroesophageal reflux disease)    Headache    regular   Heartburn    Hypertension    Joint pain    Localized swelling of both lower legs    Osteoarthritis    Other specified disorders of thyroid     Pre-diabetes    Rheumatoid arthritis (HCC)    Sleep apnea    mild, patient does not use mask   SOB (shortness of breath)    Past Surgical History:  Procedure Laterality Date   ANTERIOR CERVICAL DECOMP/DISCECTOMY FUSION  1998   C4-7   NOVASURE ABLATION     TOTAL KNEE ARTHROPLASTY Left 01/17/2017   Procedure: LEFT TOTAL KNEE ARTHROPLASTY;  Surgeon: Arnie Lao, MD;  Location: [facility];  Service: Orthopedics;  Laterality: Left;   TOTAL KNEE ARTHROPLASTY Right 03/19/2024   Procedure: ARTHROPLASTY, KNEE, TOTAL;  Surgeon: Arnie Lao, MD;  Location: [facility];  Service: Orthopedics;  Laterality: Right;   TUBAL LIGATION     Patient Active Problem List   Diagnosis Date Noted   Status post total right knee replacement 03/19/2024   Visit for routine gyn exam 02/25/2022   Vitamin D  deficiency 02/21/2021   Essential  hypertension 02/21/2021   Pure hypercholesterolemia 02/21/2021   Class 3 severe obesity with serious comorbidity and body mass index (BMI) of 50.0 to 59.9 in adult [facility]) 06/13/2020   Prediabetes 06/13/2020   Leg pain 06/13/2020   Status post total left knee replacement 01/17/2017   Degenerative disc disease, cervical     PCP: Charle Congo, MD   REFERRING PROVIDER: Arnie Lao, MD  REFERRING DIAG: [phone] (ICD-10-CM) - Status post total right knee replacement   THERAPY DIAG:  Acute pain of right knee  Muscle weakness (generalized)  Stiffness of right knee, not elsewhere classified  Difficulty in walking, not elsewhere classified  Rationale for Evaluation and Treatment: Rehabilitation  ONSET DATE: 03/19/24 DOS  SUBJECTIVE:   SUBJECTIVE STATEMENT: Patient saw Dr. Arvella Bird PA who checked out the wound, she was a bit concerned about drainage, how it looked .  The pain is so bad, I dont know why.  I am getting an XR after I leave here to be sure nothing is wrong.  Pain still 8/10.  I feel like my foot is moving more.    PERTINENT HISTORY: DDD- cervical and lumbar; L TKA 2018, High BMI  Recent Md  visit: The patient is a 63 year old patient well-known to us .  She is 2 weeks out from a right total knee replacement to treat severe right knee arthritis.  She is someone who is morbidly obese with a BMI of around 46.  She had severe end-stage arthritis of her right knee and we successfully replaced her left knee many years ago.  Postoperatively she does have foot drop.  Her surgery was very difficult given her obesity and certainly we likely stretched on some nerves leading to the foot drop.  I did let her know that usually these will resolve but it can take up to a year to resolve.  She does have AFO.   Sutures and staples been removed from her right knee incision.  The very bottom of the incision has a little area that is raw and unguinal have mupirocin  only placed on  this area daily.  The rest the incision looks good and Steri-Strips have been applied.  She has decent range of motion of her right knee for this first visit.   At this point we need to set her up for outpatient physical therapy upstairs for range of motion of her right knee and working on balance and coordination.  She will continue her AFO until her foot drop resolves.  I would like to see her back in just 2 weeks for a wound check of her incision.  She can stop any blood thinners from my standpoint as well.   Of note, she is in pain management and states that they will take over her pain medicine needs at this standpoint.   PAIN:  Are you having pain?Yes: NPRS scale: 8/10. Pain range the week before start of PT: 5-8/10 Pain location: R knee Pain description: ache Aggravating factors: walking Relieving factors: ice machine, pain medication  PRECAUTIONS: None  RED FLAGS: None   WEIGHT BEARING RESTRICTIONS: None; WBAT  FALLS:  Has patient fallen in last 6 months? No  LIVING ENVIRONMENT: Lives with: lives with their family Lives in: House/apartment Stairs: 0 steps Has following equipment at home: Single point cane, Environmental consultant - 2 wheeled, Environmental consultant - 4 wheeled, shower chair, bed side commode, and Grab bars  OCCUPATION: Not working  PLOF: Independent with household mobility with device  PATIENT GOALS: Good use of my R knee  NEXT MD VISIT: 04/21/24 Dr. Lucienne Ryder  OBJECTIVE:  Note: Objective measures were completed at Evaluation unless otherwise noted.  PATIENT SURVEYS:  LEFS 15/80=19%  COGNITION: Overall cognitive status: Within functional limits for tasks assessed     SENSATION: Light touch: Impaired R foot  EDEMA:   Swelling present  MUSCLE LENGTH: Hamstrings: Right NT deg; Left NT deg Andy Bannister test: Right NT deg; Left NT deg  POSTURE: increased lumbar lordosis and flexed trunk   PALPATION: TTP R lateral knee  LOWER EXTREMITY ROM:  Active ROM Right eval Left eval  Rt  04/12/24  Hip flexion     Hip extension     Hip abduction     Hip adduction     Hip internal rotation     Hip external rotation     Knee flexion 80  92 deg AAROM   Knee extension 15 lacking    Ankle dorsiflexion     Ankle plantarflexion     Ankle inversion     Ankle eversion      (Blank rows = not tested)  LOWER EXTREMITY MMT:  MMT Right eval Left eval  Hip flexion 2+   Hip  extension 2+   Hip abduction    Hip adduction 2+   Hip internal rotation    Hip external rotation 2+   Knee flexion 3   Knee extension 3   Ankle dorsiflexion Drop foot   Ankle plantarflexion    Ankle inversion    Ankle eversion     (Blank rows = not tested)  LOWER EXTREMITY SPECIAL TESTS:  NT  FUNCTIONAL TESTS:  04/08/24: 5 times sit to stand: 24.76 sec used hands to pull up to walker  2 minute walk test: 81 feet, with RW   On her 2nd visit GAIT: Distance walked: 100' Assistive device utilized: Environmental consultant - 2 wheeled Level of assistance: Modified independence Comments: Antalgic gait pattern over R LE, slow pace, R drop foot, wears drop foot brace                                                                                                                                TREATMENT DATE:  [facility] Adult PT Treatment:                                                DATE: 04/12/24 Therapeutic Exercise: NuStep 6 min L4 UE and LE  Seated and Supine AAROM Rt knee to tolerance:  HS curl red band x 15  Seated knee flexion no band  LAQ with ball  x 15  Hamstring stretch 3 x 20 sec  Knee flexion with strap x 5 to 92 deg  Bridging with bolster x 10   Modalities: 15 min Vaso coldest setting light compression elevated  Measured pre and post: Rt knee 21.5 inches, post 21 inches   [facility] Adult PT Treatment:                                                DATE: 04/08/24 Therapeutic Exercise: Supine hooklying SAQ x 2 x 10  Quad set towel under knee x 2 x 10  SLS to tolerance x 10  Heel slides without  sheet  AAROM flexion using sheet : supine and then in sitting ext/flexion  Sit to stand x 5  2 min walk test  81 feet with RW then walking back after a short rest  Supine glute bridge x 10 legs on bolster  Knee extension thigh supported by towel x 10, painful  Self Care: Foot drop,  elevated RW   Modalities: Cold pack 10 min   [facility] Adult PT Treatment:  DATE: 04/06/24 Self Care: Pt self care re: concentrating on quad sets, heel slides, and SAQ with her HEP; more frequent use of the ice machine and elevation, at least 3x day for pain and swelling management   Therapeutic Exercise: Quad sets x4 Supine heel slide x5 Supine SAQ x5 Seated heel slides x5  PATIENT EDUCATION:  Education details: use of ice vs heat.  Sleeping comfort/situation Person educated: Patient Education method: Explanation, Demonstration, Tactile cues, Verbal cues, and Handouts Education comprehension: verbalized understanding, returned demonstration, verbal cues required, and tactile cues required  HOME EXERCISE PROGRAM: Access Code: [email or web address] URL: [email or web address] Date: 04/12/2024 Prepared by: Marci Setter  Exercises - Seated Long Arc Quad  - 1 x daily - 7 x weekly - 2 sets - 10 reps - 5 hold - Seated Knee Flexion AAROM  - 1 x daily - 7 x weekly - 2 sets - 10 reps - 5 hold - Supine Heel Slide  - 1 x daily - 7 x weekly - 2 sets - 10 reps - 5 hold - Supine Quad Set  - 1 x daily - 7 x weekly - 2 sets - 10 reps - 5 hold - Supine Gluteal Sets  - 1 x daily - 7 x weekly - 2 sets - 10 reps - 5 hold - Proper Sit to Stand Technique with PLB  - 1 x daily - 7 x weekly - 2 sets - 10 reps - 30 hold - Sit to Stand with Counter Support  - 1 x daily - 7 x weekly - 2 sets - 10 reps - 5 hold  ASSESSMENT:   CLINICAL IMPRESSION:  Reports severe pain despite taking pain medicine.  She has difficulty resting at night due to discomfort on her hospital bed but she is  trying to get a lift recliner instead.  She does not think that she is doing too much but does try to get up periodically during the day.  Her range of motion is good able to achieve 92 degrees today with active assisted from the strap.  Able to decrease circumference of her right knee with the application of vasopneumatic device.  Patient was getting an x-ray after PT for peace of mind and to ensure there is no issues with her prosthesis.  Continue plan of care to optimize right knee function for improved mobility.  Patient is a 63 y.o. female who was seen today for physical therapy evaluation and treatment for Z96.651 (ICD-10-CM) - Status post total right knee replacement. Pt presents when min decreased AROM of the R knee for flexion and extension, decreased R LE strength, and walking with an antalgic gait pattern/slow pace. Following surgery, pt developed drop foot and uses a drop foot brace with ambulation. Pt will benefit from skilled PT 2w8 to address impairments to optimize R knee function for improved mobility..   OBJECTIVE IMPAIRMENTS: decreased activity tolerance, decreased balance, difficulty walking, decreased ROM, decreased strength, increased edema, obesity, and pain.   ACTIVITY LIMITATIONS: carrying, lifting, bending, sitting, standing, squatting, sleeping, stairs, transfers, bed mobility, bathing, locomotion level, and caring for others  PARTICIPATION LIMITATIONS: meal prep, cleaning, laundry, driving, shopping, and community activity  PERSONAL FACTORS: Fitness, Past/current experiences, Time since onset of injury/illness/exacerbation, and 3+ comorbidities: DDD- cervical and lumbar; L TKA 2018, High BMI  are also affecting patient's functional outcome.   REHAB POTENTIAL: Good  CLINICAL DECISION MAKING: Evolving/moderate complexity  EVALUATION COMPLEXITY: Moderate   GOALS:  SHORT TERM GOALS: Target date: 04/23/24 Pt will  be Ind in an initial HEP  Baseline: Goal status:  INITIAL  2.  Increase R knee AROM to 7-100d Baseline: 15-80  Goal status: INITIAL  LONG TERM GOALS: Target date: 06/11/24  Pt will be Ind in a final HEP to maintain achieved LOF Baseline:  Goal status: INITIAL  2.  Increase R knee AROM  to 0-115d for functional mobility sitting and asc/dsc steps Baseline:  Goal status: INITIAL  3.  Improve 5xSTS by MCID of 5" and c RW by MCID of 5ft as indication of improved functional mobility  Baseline: TBA on 2nd viist Goal status: INITIAL  4.  Increase R knee strength to 4/5 and R hip to 3/5 for appropriate functional use with community ambulation Baseline:  Goal status: INITIAL  5.  Pt will be Ind with ambulation c a RW for 370' for community ambulation. With a LRAD as R drop foot improves. Baseline:  Goal status: INITIAL  6.  Pt will be able to asc/dsc 5 steps c assist c RW for community mobility Baseline:  Goal status: INITIAL   PLAN:  PT FREQUENCY: 2x/week  PT DURATION: 8 weeks  PLANNED INTERVENTIONS: 97164- PT Re-evaluation, 97110-Therapeutic exercises, 97530- Therapeutic activity, 97112- Neuromuscular re-education, 97535- Self Care, 45409- Manual therapy, [id number]- Gait training, [phone]- Electrical stimulation (unattended), 97016- Vasopneumatic device, Patient/Family education, Balance training, Stair training, Taping, Dry Needling, Joint mobilization, Cryotherapy, and Moist heat  PLAN FOR NEXT SESSION: "What Did x-ray say?  patient to bring in her ankle brace as well as her home exercise program to build upon continue to address activity tolerance knee active range of motion .assess response to HEP; progress therex as indicated; use of modalities, manual therapy; and TPDN as indicated.    Marci Setter, PT 04/12/24 11:58 AM Phone: [phone] Fax: [phone]

## 2024-04-12 ENCOUNTER — Other Ambulatory Visit (INDEPENDENT_AMBULATORY_CARE_PROVIDER_SITE_OTHER)

## 2024-04-12 ENCOUNTER — Ambulatory Visit: Admitting: Physical Therapy

## 2024-04-12 ENCOUNTER — Ambulatory Visit (INDEPENDENT_AMBULATORY_CARE_PROVIDER_SITE_OTHER)

## 2024-04-12 ENCOUNTER — Telehealth: Payer: Self-pay

## 2024-04-12 DIAGNOSIS — M25661 Stiffness of right knee, not elsewhere classified: Secondary | ICD-10-CM

## 2024-04-12 DIAGNOSIS — M1711 Unilateral primary osteoarthritis, right knee: Secondary | ICD-10-CM | POA: Diagnosis not present

## 2024-04-12 DIAGNOSIS — R262 Difficulty in walking, not elsewhere classified: Secondary | ICD-10-CM

## 2024-04-12 DIAGNOSIS — M25561 Pain in right knee: Secondary | ICD-10-CM | POA: Diagnosis not present

## 2024-04-12 DIAGNOSIS — M6281 Muscle weakness (generalized): Secondary | ICD-10-CM

## 2024-04-12 NOTE — Progress Notes (Signed)
 Patient is here for right knee x-ray only.

## 2024-04-12 NOTE — Telephone Encounter (Signed)
 [name]

## 2024-04-12 NOTE — Telephone Encounter (Signed)
 Patient aware of the below message

## 2024-04-15 NOTE — Therapy (Signed)
 OUTPATIENT PHYSICAL THERAPY LOWER EXTREMITY NOTE   Patient Name: [name] MRN: [id number] DOB:1961/05/28, 63 y.o., female Today's Date: 04/16/2024  END OF SESSION:  PT End of Session - 04/16/24 1156     Visit Number 4    Number of Visits 17    Date for PT Re-Evaluation 06/11/24    Authorization Type [employer], M[employer]    Authorization - Visit Number 4    Authorization - Number of Visits 27    PT Start Time 1148    PT Stop Time 1238    PT Time Calculation (min) 50 min    Activity Tolerance Patient tolerated treatment well    Behavior During Therapy WFL for tasks assessed/performed                Past Medical History:  Diagnosis Date   Anxiety    Asthma    Back pain    Chronic pain    DDD (degenerative disc disease), lumbar    Degenerative disc disease    Degenerative disc disease, cervical    Degenerative disc disease, cervical    Fibroid    Fibromyalgia    GERD (gastroesophageal reflux disease)    Headache    regular   Heartburn    Hypertension    Joint pain    Localized swelling of both lower legs    Osteoarthritis    Other specified disorders of thyroid     Pre-diabetes    Rheumatoid arthritis (HCC)    Sleep apnea    mild, patient does not use mask   SOB (shortness of breath)    Past Surgical History:  Procedure Laterality Date   ANTERIOR CERVICAL DECOMP/DISCECTOMY FUSION  1998   C4-7   NOVASURE ABLATION     TOTAL KNEE ARTHROPLASTY Left 01/17/2017   Procedure: LEFT TOTAL KNEE ARTHROPLASTY;  Surgeon: Arnie Lao, MD;  Location: [facility];  Service: Orthopedics;  Laterality: Left;   TOTAL KNEE ARTHROPLASTY Right 03/19/2024   Procedure: ARTHROPLASTY, KNEE, TOTAL;  Surgeon: Arnie Lao, MD;  Location: [facility];  Service: Orthopedics;  Laterality: Right;   TUBAL LIGATION     Patient Active Problem List   Diagnosis Date Noted   Status post total right knee replacement 03/19/2024   Visit for routine gyn exam 02/25/2022   Vitamin D   deficiency 02/21/2021   Essential hypertension 02/21/2021   Pure hypercholesterolemia 02/21/2021   Class 3 severe obesity with serious comorbidity and body mass index (BMI) of 50.0 to 59.9 in adult 06/13/2020   Prediabetes 06/13/2020   Leg pain 06/13/2020   Status post total left knee replacement 01/17/2017   Degenerative disc disease, cervical     PCP: Charle Congo, MD   REFERRING PROVIDER: Arnie Lao, MD  REFERRING DIAG: [phone] (ICD-10-CM) - Status post total right knee replacement   THERAPY DIAG:  Acute pain of right knee  Muscle weakness (generalized)  Stiffness of right knee, not elsewhere classified  Difficulty in walking, not elsewhere classified  Rationale for Evaluation and Treatment: Rehabilitation  ONSET DATE: 03/19/24 DOS  SUBJECTIVE:   SUBJECTIVE STATEMENT: Patient saw Dr. Arvella Bird PA who checked out the wound, she was a bit concerned about drainage, how it looked .  The pain is so bad, I dont know why.  I am getting an XR after I leave here to be sure nothing is wrong.  Pain still 8/10.  I feel like my foot is moving more.    PERTINENT HISTORY: DDD- cervical and lumbar;  L TKA 2018, High BMI  Recent Md visit: The patient is a 63 year old patient well-known to us .  She is 2 weeks out from a right total knee replacement to treat severe right knee arthritis.  She is someone who is morbidly obese with a BMI of around 46.  She had severe end-stage arthritis of her right knee and we successfully replaced her left knee many years ago.  Postoperatively she does have foot drop.  Her surgery was very difficult given her obesity and certainly we likely stretched on some nerves leading to the foot drop.  I did let her know that usually these will resolve but it can take up to a year to resolve.  She does have AFO.   Sutures and staples been removed from her right knee incision.  The very bottom of the incision has a little area that is raw and unguinal have  mupirocin  only placed on this area daily.  The rest the incision looks good and Steri-Strips have been applied.  She has decent range of motion of her right knee for this first visit.   At this point we need to set her up for outpatient physical therapy upstairs for range of motion of her right knee and working on balance and coordination.  She will continue her AFO until her foot drop resolves.  I would like to see her back in just 2 weeks for a wound check of her incision.  She can stop any blood thinners from my standpoint as well.   Of note, she is in pain management and states that they will take over her pain medicine needs at this standpoint.   PAIN:  Are you having pain?Yes: NPRS scale: 4/10. Pain range the week before start of PT: 5-8/10 Pain location: R knee Pain description: ache Aggravating factors: walking Relieving factors: ice machine, pain medication  PRECAUTIONS: None  RED FLAGS: None   WEIGHT BEARING RESTRICTIONS: None; WBAT  FALLS:  Has patient fallen in last 6 months? No  LIVING ENVIRONMENT: Lives with: lives with their family Lives in: House/apartment Stairs: 0 steps Has following equipment at home: Single point cane, Environmental consultant - 2 wheeled, Environmental consultant - 4 wheeled, shower chair, bed side commode, and Grab bars  OCCUPATION: Not working  PLOF: Independent with household mobility with device  PATIENT GOALS: Good use of my R knee  NEXT MD VISIT: 04/21/24 Dr. Lucienne Ryder  OBJECTIVE:  Note: Objective measures were completed at Evaluation unless otherwise noted.  PATIENT SURVEYS:  LEFS 15/80=19%  COGNITION: Overall cognitive status: Within functional limits for tasks assessed     SENSATION: Light touch: Impaired R foot  EDEMA:   Swelling present  MUSCLE LENGTH: Hamstrings: Right NT deg; Left NT deg Andy Bannister test: Right NT deg; Left NT deg  POSTURE: increased lumbar lordosis and flexed trunk   PALPATION: TTP R lateral knee  LOWER EXTREMITY ROM:  Active ROM  Right eval Left eval Rt  04/12/24 Rt 04/16/24  Hip flexion      Hip extension      Hip abduction      Hip adduction      Hip internal rotation      Hip external rotation      Knee flexion 80  92 deg AAROM  97d AAROM  Knee extension 15 lacking     Ankle dorsiflexion      Ankle plantarflexion      Ankle inversion      Ankle eversion       (  Blank rows = not tested)  LOWER EXTREMITY MMT:  MMT Right eval Left eval  Hip flexion 2+   Hip extension 2+   Hip abduction    Hip adduction 2+   Hip internal rotation    Hip external rotation 2+   Knee flexion 3   Knee extension 3   Ankle dorsiflexion Drop foot   Ankle plantarflexion    Ankle inversion    Ankle eversion     (Blank rows = not tested)  LOWER EXTREMITY SPECIAL TESTS:  NT  FUNCTIONAL TESTS:  04/08/24: 5 times sit to stand: 24.76 sec used hands to pull up to walker  2 minute walk test: 81 feet, with RW   On her 2nd visit GAIT: Distance walked: 100' Assistive device utilized: Environmental consultant - 2 wheeled Level of assistance: Modified independence Comments: Antalgic gait pattern over R LE, slow pace, R drop foot, wears drop foot brace                                                                                                                                TREATMENT DATE:  [facility] Adult PT Treatment:                                                DATE: 04/16/24 Therapeutic Exercise: NuStep 6 min L4 UE and LE  Supine AROM Rt knee 2 x 15 HS curl red band 2 x 15  LAQ with ball  2 x 15  STS x10 SLR 2x10 small ROM Knee flexion with strap x 5 to 97 deg  Bridging with bolster 2 x 10  Modalities: 15 min Vaso coldest setting light compression elevated  Measured pre and post: Rt knee 21.5 inches, post 21 inches   [facility] Adult PT Treatment:                                                DATE: 04/12/24 Therapeutic Exercise: NuStep 6 min L4 UE and LE  Seated and Supine AAROM Rt knee to tolerance:  HS curl red band x 15  Seated  knee flexion no band  LAQ with ball  x 15  Hamstring stretch 3 x 20 sec  Knee flexion with strap x 5 to 92 deg  Bridging with bolster x 10   Modalities: 15 min Vaso coldest setting light compression elevated  Measured pre and post: Rt knee 21.5 inches, post 21 inches   [facility] Adult PT Treatment:  DATE: 04/08/24 Therapeutic Exercise: Supine hooklying SAQ x 2 x 10  Quad set towel under knee x 2 x 10  SLS to tolerance x 10  Heel slides without sheet  AAROM flexion using sheet : supine and then in sitting ext/flexion  Sit to stand x 5  2 min walk test  81 feet with RW then walking back after a short rest  Supine glute bridge x 10 legs on bolster  Knee extension thigh supported by towel x 10, painful  Self Care: Foot drop,  elevated RW   Modalities: Cold pack 10 min   PATIENT EDUCATION:  Education details: use of ice vs heat.  Sleeping comfort/situation Person educated: Patient Education method: Explanation, Demonstration, Tactile cues, Verbal cues, and Handouts Education comprehension: verbalized understanding, returned demonstration, verbal cues required, and tactile cues required  HOME EXERCISE PROGRAM: Access Code: [email or web address] URL: [email or web address] Date: 04/12/2024 Prepared by: Marci Setter  Exercises - Seated Long Arc Quad  - 1 x daily - 7 x weekly - 2 sets - 10 reps - 5 hold - Seated Knee Flexion AAROM  - 1 x daily - 7 x weekly - 2 sets - 10 reps - 5 hold - Supine Heel Slide  - 1 x daily - 7 x weekly - 2 sets - 10 reps - 5 hold - Supine Quad Set  - 1 x daily - 7 x weekly - 2 sets - 10 reps - 5 hold - Supine Gluteal Sets  - 1 x daily - 7 x weekly - 2 sets - 10 reps - 5 hold - Proper Sit to Stand Technique with PLB  - 1 x daily - 7 x weekly - 2 sets - 10 reps - 30 hold - Sit to Stand with Counter Support  - 1 x daily - 7 x weekly - 2 sets - 10 reps - 5 hold  ASSESSMENT:   CLINICAL IMPRESSION: Pt wore the drop  foot brace which provides some assistance, but that is limited because the pt can't wear it with a shoe on. Pt participated in PT for R knee/LE ROM and strengthening. AAROM for knee flexion continues to make gradual progress. Pt has difficulty completing a SLR, but is only somewhat less than her ability to complete a SLR with the L LE. Vaso was completed at the end of session for symptom management. Pt tolerated PT today without adverse effects. Pt will continue to benefit from skilled PT to address impairments for improved R knee/LE function.   Patient is a 63 y.o. female who was seen today for physical therapy evaluation and treatment for Z96.651 (ICD-10-CM) - Status post total right knee replacement. Pt presents when min decreased AROM of the R knee for flexion and extension, decreased R LE strength, and walking with an antalgic gait pattern/slow pace. Following surgery, pt developed drop foot and uses a drop foot brace with ambulation. Pt will benefit from skilled PT 2w8 to address impairments to optimize R knee function for improved mobility..   OBJECTIVE IMPAIRMENTS: decreased activity tolerance, decreased balance, difficulty walking, decreased ROM, decreased strength, increased edema, obesity, and pain.   ACTIVITY LIMITATIONS: carrying, lifting, bending, sitting, standing, squatting, sleeping, stairs, transfers, bed mobility, bathing, locomotion level, and caring for others  PARTICIPATION LIMITATIONS: meal prep, cleaning, laundry, driving, shopping, and community activity  PERSONAL FACTORS: Fitness, Past/current experiences, Time since onset of injury/illness/exacerbation, and 3+ comorbidities: DDD- cervical and lumbar; L TKA 2018, High BMI  are also affecting patient's functional outcome.  REHAB POTENTIAL: Good  CLINICAL DECISION MAKING: Evolving/moderate complexity  EVALUATION COMPLEXITY: Moderate   GOALS:  SHORT TERM GOALS: Target date: 04/23/24 Pt will be Ind in an initial HEP   Baseline: Goal status: INITIAL  2.  Increase R knee AROM to 7-100d Baseline: 15-80  Goal status: INITIAL  LONG TERM GOALS: Target date: 06/11/24  Pt will be Ind in a final HEP to maintain achieved LOF Baseline:  Goal status: INITIAL  2.  Increase R knee AROM  to 0-115d for functional mobility sitting and asc/dsc steps Baseline:  Goal status: INITIAL  3.  Improve 5xSTS by MCID of 5" and c RW by MCID of 11ft as indication of improved functional mobility  Baseline: TBA on 2nd viist Goal status: INITIAL  4.  Increase R knee strength to 4/5 and R hip to 3/5 for appropriate functional use with community ambulation Baseline:  Goal status: INITIAL  5.  Pt will be Ind with ambulation c a RW for 370' for community ambulation. With a LRAD as R drop foot improves. Baseline:  Goal status: INITIAL  6.  Pt will be able to asc/dsc 5 steps c assist c RW for community mobility Baseline:  Goal status: INITIAL   PLAN:  PT FREQUENCY: 2x/week  PT DURATION: 8 weeks  PLANNED INTERVENTIONS: 97164- PT Re-evaluation, 97110-Therapeutic exercises, 97530- Therapeutic activity, 97112- Neuromuscular re-education, 97535- Self Care, 56387- Manual therapy, [id number]- Gait training, [phone]- Electrical stimulation (unattended), 97016- Vasopneumatic device, Patient/Family education, Balance training, Stair training, Taping, Dry Needling, Joint mobilization, Cryotherapy, and Moist heat  PLAN FOR NEXT SESSION: "What Did x-ray say?  patient to bring in her ankle brace as well as her home exercise program to build upon continue to address activity tolerance knee active range of motion .assess response to HEP; progress therex as indicated; use of modalities, manual therapy; and TPDN as indicated.   Olyver Hawes MS, PT 04/16/24 1:15 PM

## 2024-04-16 ENCOUNTER — Ambulatory Visit: Attending: Orthopaedic Surgery

## 2024-04-16 DIAGNOSIS — M25661 Stiffness of right knee, not elsewhere classified: Secondary | ICD-10-CM | POA: Diagnosis present

## 2024-04-16 DIAGNOSIS — M6281 Muscle weakness (generalized): Secondary | ICD-10-CM | POA: Diagnosis present

## 2024-04-16 DIAGNOSIS — R262 Difficulty in walking, not elsewhere classified: Secondary | ICD-10-CM | POA: Insufficient documentation

## 2024-04-16 DIAGNOSIS — M25561 Pain in right knee: Secondary | ICD-10-CM | POA: Insufficient documentation

## 2024-04-20 ENCOUNTER — Encounter: Admitting: Physical Therapy

## 2024-04-21 ENCOUNTER — Ambulatory Visit

## 2024-04-21 ENCOUNTER — Encounter: Payer: Self-pay | Admitting: Orthopaedic Surgery

## 2024-04-21 ENCOUNTER — Ambulatory Visit (INDEPENDENT_AMBULATORY_CARE_PROVIDER_SITE_OTHER): Admitting: Orthopaedic Surgery

## 2024-04-21 DIAGNOSIS — Z96651 Presence of right artificial knee joint: Secondary | ICD-10-CM

## 2024-04-21 MED ORDER — GABAPENTIN 300 MG PO CAPS: 300.0000 mg | ORAL_CAPSULE | Freq: Three times a day (TID) | ORAL | 1 refills | Status: DC

## 2024-04-21 NOTE — Therapy (Deleted)
 OUTPATIENT PHYSICAL THERAPY LOWER EXTREMITY NOTE   Patient Name: [name] MRN: [id number] DOB:08/11/1961, 63 y.o., female Today's Date: 04/21/2024  END OF SESSION:       Past Medical History:  Diagnosis Date   Anxiety    Asthma    Back pain    Chronic pain    DDD (degenerative disc disease), lumbar    Degenerative disc disease    Degenerative disc disease, cervical    Degenerative disc disease, cervical    Fibroid    Fibromyalgia    GERD (gastroesophageal reflux disease)    Headache    regular   Heartburn    Hypertension    Joint pain    Localized swelling of both lower legs    Osteoarthritis    Other specified disorders of thyroid     Pre-diabetes    Rheumatoid arthritis (HCC)    Sleep apnea    mild, patient does not use mask   SOB (shortness of breath)    Past Surgical History:  Procedure Laterality Date   ANTERIOR CERVICAL DECOMP/DISCECTOMY FUSION  1998   C4-7   NOVASURE ABLATION     TOTAL KNEE ARTHROPLASTY Left 01/17/2017   Procedure: LEFT TOTAL KNEE ARTHROPLASTY;  Surgeon: Arnie Lao, MD;  Location: [facility];  Service: Orthopedics;  Laterality: Left;   TOTAL KNEE ARTHROPLASTY Right 03/19/2024   Procedure: ARTHROPLASTY, KNEE, TOTAL;  Surgeon: Arnie Lao, MD;  Location: [facility];  Service: Orthopedics;  Laterality: Right;   TUBAL LIGATION     Patient Active Problem List   Diagnosis Date Noted   Status post total right knee replacement 03/19/2024   Visit for routine gyn exam 02/25/2022   Vitamin D  deficiency 02/21/2021   Essential hypertension 02/21/2021   Pure hypercholesterolemia 02/21/2021   Class 3 severe obesity with serious comorbidity and body mass index (BMI) of 50.0 to 59.9 in adult 06/13/2020   Prediabetes 06/13/2020   Leg pain 06/13/2020   Status post total left knee replacement 01/17/2017   Degenerative disc disease, cervical     PCP: Danielle Congo, MD   REFERRING PROVIDER: Arnie Lao,  MD  REFERRING DIAG: [phone] (ICD-10-CM) - Status post total right knee replacement   THERAPY DIAG:  No diagnosis found.  Rationale for Evaluation and Treatment: Rehabilitation  ONSET DATE: 03/19/24 DOS  SUBJECTIVE:   SUBJECTIVE STATEMENT: Patient saw Dr. Arvella Bird PA who checked out the wound, she was a bit concerned about drainage, how it looked .  The pain is so bad, I dont know why.  I am getting an XR after I leave here to be sure nothing is wrong.  Pain still 8/10.  I feel like my foot is moving more.    PERTINENT HISTORY: DDD- cervical and lumbar; L TKA 2018, High BMI  Recent Md visit: The patient is a 63 year old patient well-known to us .  She is 2 weeks out from a right total knee replacement to treat severe right knee arthritis.  She is someone who is morbidly obese with a BMI of around 46.  She had severe end-stage arthritis of her right knee and we successfully replaced her left knee many years ago.  Postoperatively she does have foot drop.  Her surgery was very difficult given her obesity and certainly we likely stretched on some nerves leading to the foot drop.  I did let her know that usually these will resolve but it can take up to a year to resolve.  She does have AFO.  Sutures and staples been removed from her right knee incision.  The very bottom of the incision has a little area that is raw and unguinal have mupirocin  only placed on this area daily.  The rest the incision looks good and Steri-Strips have been applied.  She has decent range of motion of her right knee for this first visit.   At this point we need to set her up for outpatient physical therapy upstairs for range of motion of her right knee and working on balance and coordination.  She will continue her AFO until her foot drop resolves.  I would like to see her back in just 2 weeks for a wound check of her incision.  She can stop any blood thinners from my standpoint as well.   Of note, she is in pain  management and states that they will take over her pain medicine needs at this standpoint.   PAIN:  Are you having pain?Yes: NPRS scale: 4/10. Pain range the week before start of PT: 5-8/10 Pain location: R knee Pain description: ache Aggravating factors: walking Relieving factors: ice machine, pain medication  PRECAUTIONS: None  RED FLAGS: None   WEIGHT BEARING RESTRICTIONS: None; WBAT  FALLS:  Has patient fallen in last 6 months? No  LIVING ENVIRONMENT: Lives with: lives with their family Lives in: House/apartment Stairs: 0 steps Has following equipment at home: Single point cane, Environmental consultant - 2 wheeled, Environmental consultant - 4 wheeled, shower chair, bed side commode, and Grab bars  OCCUPATION: Not working  PLOF: Independent with household mobility with device  PATIENT GOALS: Good use of my R knee  NEXT MD VISIT: 04/21/24 Dr. Lucienne Ryder  OBJECTIVE:  Note: Objective measures were completed at Evaluation unless otherwise noted.  PATIENT SURVEYS:  LEFS 15/80=19%  COGNITION: Overall cognitive status: Within functional limits for tasks assessed     SENSATION: Light touch: Impaired R foot  EDEMA:   Swelling present  MUSCLE LENGTH: Hamstrings: Right NT deg; Left NT deg Andy Bannister test: Right NT deg; Left NT deg  POSTURE: increased lumbar lordosis and flexed trunk   PALPATION: TTP R lateral knee  LOWER EXTREMITY ROM:  Active ROM Right eval Left eval Rt  04/12/24 Rt 04/16/24  Hip flexion      Hip extension      Hip abduction      Hip adduction      Hip internal rotation      Hip external rotation      Knee flexion 80  92 deg AAROM  97d AAROM  Knee extension 15 lacking     Ankle dorsiflexion      Ankle plantarflexion      Ankle inversion      Ankle eversion       (Blank rows = not tested)  LOWER EXTREMITY MMT:  MMT Right eval Left eval  Hip flexion 2+   Hip extension 2+   Hip abduction    Hip adduction 2+   Hip internal rotation    Hip external rotation 2+   Knee  flexion 3   Knee extension 3   Ankle dorsiflexion Drop foot   Ankle plantarflexion    Ankle inversion    Ankle eversion     (Blank rows = not tested)  LOWER EXTREMITY SPECIAL TESTS:  NT  FUNCTIONAL TESTS:  04/08/24: 5 times sit to stand: 24.76 sec used hands to pull up to walker  2 minute walk test: 81 feet, with RW   On her 2nd visit  GAIT: Distance walked: 100' Assistive device utilized: Environmental consultant - 2 wheeled Level of assistance: Modified independence Comments: Antalgic gait pattern over R LE, slow pace, R drop foot, wears drop foot brace                                                                                                                                TREATMENT DATE:  [facility] Adult PT Treatment:                                                DATE: 04/21/24 Therapeutic Exercise: *** Manual Therapy: *** Neuromuscular re-ed: *** Therapeutic Activity: *** Modalities: *** Self Care: ***   [facility] Adult PT Treatment:                                                DATE: 04/16/24 Therapeutic Exercise: NuStep 6 min L4 UE and LE  Supine AROM Rt knee 2 x 15 HS curl red band 2 x 15  LAQ with ball  2 x 15  STS x10 SLR 2x10 small ROM Knee flexion with strap x 5 to 97 deg  Bridging with bolster 2 x 10  Modalities: 15 min Vaso coldest setting light compression elevated  Measured pre and post: Rt knee 21.5 inches, post 21 inches   [facility] Adult PT Treatment:                                                DATE: 04/12/24 Therapeutic Exercise: NuStep 6 min L4 UE and LE  Seated and Supine AAROM Rt knee to tolerance:  HS curl red band x 15  Seated knee flexion no band  LAQ with ball  x 15  Hamstring stretch 3 x 20 sec  Knee flexion with strap x 5 to 92 deg  Bridging with bolster x 10   Modalities: 15 min Vaso coldest setting light compression elevated  Measured pre and post: Rt knee 21.5 inches, post 21 inches   PATIENT EDUCATION:  Education details: use of ice vs heat.   Sleeping comfort/situation Person educated: Patient Education method: Explanation, Demonstration, Tactile cues, Verbal cues, and Handouts Education comprehension: verbalized understanding, returned demonstration, verbal cues required, and tactile cues required  HOME EXERCISE PROGRAM: Access Code: [email or web address] URL: [email or web address] Date: 04/12/2024 Prepared by: Marci Setter  Exercises - Seated Long Arc Quad  - 1 x daily - 7 x weekly - 2 sets - 10 reps - 5 hold - Seated Knee Flexion AAROM  - 1 x daily - 7 x weekly -  2 sets - 10 reps - 5 hold - Supine Heel Slide  - 1 x daily - 7 x weekly - 2 sets - 10 reps - 5 hold - Supine Quad Set  - 1 x daily - 7 x weekly - 2 sets - 10 reps - 5 hold - Supine Gluteal Sets  - 1 x daily - 7 x weekly - 2 sets - 10 reps - 5 hold - Proper Sit to Stand Technique with PLB  - 1 x daily - 7 x weekly - 2 sets - 10 reps - 30 hold - Sit to Stand with Counter Support  - 1 x daily - 7 x weekly - 2 sets - 10 reps - 5 hold  ASSESSMENT:   CLINICAL IMPRESSION: Pt wore the drop foot brace which provides some assistance, but that is limited because the pt can't wear it with a shoe on. Pt participated in PT for R knee/LE ROM and strengthening. AAROM for knee flexion continues to make gradual progress. Pt has difficulty completing a SLR, but is only somewhat less than her ability to complete a SLR with the L LE. Vaso was completed at the end of session for symptom management. Pt tolerated PT today without adverse effects. Pt will continue to benefit from skilled PT to address impairments for improved R knee/LE function.   Patient is a 63 y.o. female who was seen today for physical therapy evaluation and treatment for Z96.651 (ICD-10-CM) - Status post total right knee replacement. Pt presents when min decreased AROM of the R knee for flexion and extension, decreased R LE strength, and walking with an antalgic gait pattern/slow pace. Following surgery, pt developed  drop foot and uses a drop foot brace with ambulation. Pt will benefit from skilled PT 2w8 to address impairments to optimize R knee function for improved mobility..   OBJECTIVE IMPAIRMENTS: decreased activity tolerance, decreased balance, difficulty walking, decreased ROM, decreased strength, increased edema, obesity, and pain.   ACTIVITY LIMITATIONS: carrying, lifting, bending, sitting, standing, squatting, sleeping, stairs, transfers, bed mobility, bathing, locomotion level, and caring for others  PARTICIPATION LIMITATIONS: meal prep, cleaning, laundry, driving, shopping, and community activity  PERSONAL FACTORS: Fitness, Past/current experiences, Time since onset of injury/illness/exacerbation, and 3+ comorbidities: DDD- cervical and lumbar; L TKA 2018, High BMI  are also affecting patient's functional outcome.   REHAB POTENTIAL: Good  CLINICAL DECISION MAKING: Evolving/moderate complexity  EVALUATION COMPLEXITY: Moderate   GOALS:  SHORT TERM GOALS: Target date: 04/23/24 Pt will be Ind in an initial HEP  Baseline: Goal status: INITIAL  2.  Increase R knee AROM to 7-100d Baseline: 15-80  Goal status: INITIAL  LONG TERM GOALS: Target date: 06/11/24  Pt will be Ind in a final HEP to maintain achieved LOF Baseline:  Goal status: INITIAL  2.  Increase R knee AROM  to 0-115d for functional mobility sitting and asc/dsc steps Baseline:  Goal status: INITIAL  3.  Improve 5xSTS by MCID of 5" and c RW by MCID of 67ft as indication of improved functional mobility  Baseline: TBA on 2nd viist Goal status: INITIAL  4.  Increase R knee strength to 4/5 and R hip to 3/5 for appropriate functional use with community ambulation Baseline:  Goal status: INITIAL  5.  Pt will be Ind with ambulation c a RW for 370' for community ambulation. With a LRAD as R drop foot improves. Baseline:  Goal status: INITIAL  6.  Pt will be able to asc/dsc 5 steps  c assist c RW for community  mobility Baseline:  Goal status: INITIAL   PLAN:  PT FREQUENCY: 2x/week  PT DURATION: 8 weeks  PLANNED INTERVENTIONS: 97164- PT Re-evaluation, 97110-Therapeutic exercises, 97530- Therapeutic activity, 97112- Neuromuscular re-education, 97535- Self Care, 16109- Manual therapy, [phone]- Gait training, [phone]- Electrical stimulation (unattended), 97016- Vasopneumatic device, Patient/Family education, Balance training, Stair training, Taping, Dry Needling, Joint mobilization, Cryotherapy, and Moist heat  PLAN FOR NEXT SESSION: "What Did x-ray say?  patient to bring in her ankle brace as well as her home exercise program to build upon continue to address activity tolerance knee active range of motion .assess response to HEP; progress therex as indicated; use of modalities, manual therapy; and TPDN as indicated.   Martia Dalby MS, PT 04/21/24 5:42 AM

## 2024-04-21 NOTE — Progress Notes (Signed)
 The patient is 63 year old female who is now a month out from a right total knee arthroplasty.  Her knee replacement surgery was quite difficult given her morbid obesity and the deformity of her knee.  Unfortunately she does have foot drop from surgery but hopefully that will resolve with time.  She is someone who is in chronic pain management and was on high-dose narcotics before surgery so she has had a harder time with postoperative pain control.  Examination of her right knee shows that there is a small area at the bottom of her incision that is opened up some but that was already known.  She has been treating with Bactroban  ointment daily and she will continue that.  There is no evidence infection at this standpoint so she can stop narcotics.  There is still evidence of foot drop.  Her x-rays last week of her right knee show well-seated total knee arthroplasty with no complicating features.  We will start her on Neurontin 300 mg to take at bedtime and if she is tolerating that well after 5 days can increase to twice daily.  A week after that she can increase to 3 times daily if that is helping.  She will continue her other pain management through her regular pain management specialist.  Will see her back in 6 weeks to see how she is doing overall but no x-rays are needed.

## 2024-04-23 ENCOUNTER — Encounter: Payer: Self-pay | Admitting: Physical Therapy

## 2024-04-23 ENCOUNTER — Ambulatory Visit: Admitting: Physical Therapy

## 2024-04-23 DIAGNOSIS — R262 Difficulty in walking, not elsewhere classified: Secondary | ICD-10-CM

## 2024-04-23 DIAGNOSIS — M6281 Muscle weakness (generalized): Secondary | ICD-10-CM

## 2024-04-23 DIAGNOSIS — M25561 Pain in right knee: Secondary | ICD-10-CM | POA: Diagnosis not present

## 2024-04-23 DIAGNOSIS — M25661 Stiffness of right knee, not elsewhere classified: Secondary | ICD-10-CM

## 2024-04-23 NOTE — Therapy (Signed)
 OUTPATIENT PHYSICAL THERAPY LOWER EXTREMITY NOTE   Patient Name: [name] MRN: [id number] DOB:November 28, 1961, 63 y.o., female Today's Date: 04/23/2024  END OF SESSION:  PT End of Session - 04/23/24 1003     Visit Number 5    Number of Visits 17    Date for PT Re-Evaluation 06/11/24    Authorization Type [employer], MCD    Authorization - Visit Number 5    Authorization - Number of Visits 27    PT Start Time 1014    PT Stop Time 1100    PT Time Calculation (min) 46 min    Activity Tolerance Patient tolerated treatment well    Behavior During Therapy WFL for tasks assessed/performed                 Past Medical History:  Diagnosis Date   Anxiety    Asthma    Back pain    Chronic pain    DDD (degenerative disc disease), lumbar    Degenerative disc disease    Degenerative disc disease, cervical    Degenerative disc disease, cervical    Fibroid    Fibromyalgia    GERD (gastroesophageal reflux disease)    Headache    regular   Heartburn    Hypertension    Joint pain    Localized swelling of both lower legs    Osteoarthritis    Other specified disorders of thyroid     Pre-diabetes    Rheumatoid arthritis (HCC)    Sleep apnea    mild, patient does not use mask   SOB (shortness of breath)    Past Surgical History:  Procedure Laterality Date   ANTERIOR CERVICAL DECOMP/DISCECTOMY FUSION  1998   C4-7   NOVASURE ABLATION     TOTAL KNEE ARTHROPLASTY Left 01/17/2017   Procedure: LEFT TOTAL KNEE ARTHROPLASTY;  Surgeon: Arnie Lao, MD;  Location: [facility];  Service: Orthopedics;  Laterality: Left;   TOTAL KNEE ARTHROPLASTY Right 03/19/2024   Procedure: ARTHROPLASTY, KNEE, TOTAL;  Surgeon: Arnie Lao, MD;  Location: [facility];  Service: Orthopedics;  Laterality: Right;   TUBAL LIGATION     Patient Active Problem List   Diagnosis Date Noted   Status post total right knee replacement 03/19/2024   Visit for routine gyn exam 02/25/2022   Vitamin D   deficiency 02/21/2021   Essential hypertension 02/21/2021   Pure hypercholesterolemia 02/21/2021   Class 3 severe obesity with serious comorbidity and body mass index (BMI) of 50.0 to 59.9 in adult 06/13/2020   Prediabetes 06/13/2020   Leg pain 06/13/2020   Status post total left knee replacement 01/17/2017   Degenerative disc disease, cervical     PCP: Charle Congo, MD   REFERRING PROVIDER: Arnie Lao, MD  REFERRING DIAG: [phone] (ICD-10-CM) - Status post total right knee replacement   THERAPY DIAG:  Acute pain of right knee  Muscle weakness (generalized)  Stiffness of right knee, not elsewhere classified  Difficulty in walking, not elsewhere classified  Rationale for Evaluation and Treatment: Rehabilitation  ONSET DATE: 03/19/24 DOS  SUBJECTIVE:   SUBJECTIVE STATEMENT: Pt is very discouraged and wonders if I am even getting better at all.  The nights are terrible, I toss and turn all night. Pain is 8/10.  Its the foot thing that gets me down.     PERTINENT HISTORY:  Last MD visit: There is no evidence infection at this standpoint so she can stop narcotics. There is still evidence of foot drop. Her  x-rays last week of her right knee show well-seated total knee arthroplasty with no complicating features.   DDD- cervical and lumbar; L TKA 2018, High BMI  Recent Md visit: The patient is a 63 year old patient well-known to us .  She is 2 weeks out from a right total knee replacement to treat severe right knee arthritis.  She is someone who is morbidly obese with a BMI of around 46.  She had severe end-stage arthritis of her right knee and we successfully replaced her left knee many years ago.  Postoperatively she does have foot drop.  Her surgery was very difficult given her obesity and certainly we likely stretched on some nerves leading to the foot drop.  I did let her know that usually these will resolve but it can take up to a year to resolve.  She does have  AFO.   Sutures and staples been removed from her right knee incision.  The very bottom of the incision has a little area that is raw and unguinal have mupirocin  only placed on this area daily.  The rest the incision looks good and Steri-Strips have been applied.  She has decent range of motion of her right knee for this first visit.   At this point we need to set her up for outpatient physical therapy upstairs for range of motion of her right knee and working on balance and coordination.  She will continue her AFO until her foot drop resolves.  I would like to see her back in just 2 weeks for a wound check of her incision.  She can stop any blood thinners from my standpoint as well.   Of note, she is in pain management and states that they will take over her pain medicine needs at this standpoint.   PAIN:  Are you having pain?Yes: NPRS scale: 8/10. Pain range the week before start of PT: 5-8/10 Pain location: R knee and Rt foot  Pain description: ache Aggravating factors: walking Relieving factors: ice machine, pain medication. Brace helps the foot.   PRECAUTIONS: None  RED FLAGS: None   WEIGHT BEARING RESTRICTIONS: None; WBAT  FALLS:  Has patient fallen in last 6 months? No  LIVING ENVIRONMENT: Lives with: lives with their family Lives in: House/apartment Stairs: 0 steps Has following equipment at home: Single point cane, Environmental consultant - 2 wheeled, Environmental consultant - 4 wheeled, shower chair, bed side commode, and Grab bars  OCCUPATION: Not working  PLOF: Independent with household mobility with device  PATIENT GOALS: Good use of my R knee  NEXT MD VISIT: 04/21/24 Dr. Lucienne Ryder  OBJECTIVE:  Note: Objective measures were completed at Evaluation unless otherwise noted.  PATIENT SURVEYS:  LEFS 15/80=19%  COGNITION: Overall cognitive status: Within functional limits for tasks assessed     SENSATION: Light touch: Impaired R foot  EDEMA:   Swelling present  MUSCLE LENGTH: Hamstrings:  Right NT deg; Left NT deg Andy Bannister test: Right NT deg; Left NT deg  POSTURE: increased lumbar lordosis and flexed trunk   PALPATION: TTP R lateral knee  LOWER EXTREMITY ROM:  Active ROM Right eval Left eval Rt  04/12/24 Rt 04/16/24 Rt 04/23/24  Hip flexion       Hip extension       Hip abduction       Hip adduction       Hip internal rotation       Hip external rotation       Knee flexion 80  92 deg AAROM  97d AAROM 104 deg AAROM  Knee extension 15 lacking      Ankle dorsiflexion       Ankle plantarflexion       Ankle inversion       Ankle eversion        (Blank rows = not tested)  LOWER EXTREMITY MMT:  MMT Right eval Left eval  Hip flexion 2+   Hip extension 2+   Hip abduction    Hip adduction 2+   Hip internal rotation    Hip external rotation 2+   Knee flexion 3   Knee extension 3   Ankle dorsiflexion Drop foot   Ankle plantarflexion    Ankle inversion    Ankle eversion     (Blank rows = not tested)  LOWER EXTREMITY SPECIAL TESTS:  NT  FUNCTIONAL TESTS:  04/08/24: 5 times sit to stand: 24.76 sec used hands to pull up to walker  2 minute walk test: 81 feet, with RW   On her 2nd visit GAIT: Distance walked: 100' Assistive device utilized: Environmental consultant - 2 wheeled Level of assistance: Modified independence Comments: Antalgic gait pattern over R LE, slow pace, R drop foot, wears drop foot brace                                                                                                                                TREATMENT DATE:  [facility] Adult PT Treatment:                                                DATE: 04/23/24 Therapeutic Exercise: Seated LAQ Seated hamstring curl GTB x10  AAROM knee flexion x 5  Hamstring stretch x 30 sec Ball exercises: HS curl, Bridges  SLR x 10, knee bent  SAQ x 20  Manual Therapy: Soft tissue work to [employer] knee, quad and peri-patella, scar tissue  Therapeutic Activity: Parallel bars:sit to stand  x 10 Standing heel raises  with heavy upper body support x 15 High knee march x 10 to each side Self Care: Encourage consistent performance of home exercise program, seated right knee and ankle stretch using the strap Importance of stretching the right ankle into dorsiflexion in order to avoid contracture or persistent ankle stiffness  [facility] Adult PT Treatment:                                                DATE: 04/16/24 Therapeutic Exercise: NuStep 6 min L4 UE and LE  Supine AROM Rt knee 2 x 15 HS curl red band 2 x 15  LAQ with ball  2 x 15  STS x10 SLR 2x10 small ROM Knee flexion with strap  x 5 to 97 deg  Bridging with bolster 2 x 10  Modalities: 15 min Vaso coldest setting light compression elevated  Measured pre and post: Rt knee 21.5 inches, post 21 inches   [facility] Adult PT Treatment:                                                DATE: 04/12/24 Therapeutic Exercise: NuStep 6 min L4 UE and LE  Seated and Supine AAROM Rt knee to tolerance:  HS curl red band x 15  Seated knee flexion no band  LAQ with ball  x 15  Hamstring stretch 3 x 20 sec  Knee flexion with strap x 5 to 92 deg  Bridging with bolster x 10   Modalities: 15 min Vaso coldest setting light compression elevated  Measured pre and post: Rt knee 21.5 inches, post 21 inches   PATIENT EDUCATION:  Education details: use of ice vs heat.  Sleeping comfort/situation Person educated: Patient Education method: Explanation, Demonstration, Tactile cues, Verbal cues, and Handouts Education comprehension: verbalized understanding, returned demonstration, verbal cues required, and tactile cues required  HOME EXERCISE PROGRAM: Access Code: [email or web address] URL: [email or web address] Date: 04/12/2024 Prepared by: Marci Setter  Exercises - Seated Long Arc Quad  - 1 x daily - 7 x weekly - 2 sets - 10 reps - 5 hold - Seated Knee Flexion AAROM  - 1 x daily - 7 x weekly - 2 sets - 10 reps - 5 hold - Supine Heel Slide  - 1 x daily - 7 x weekly - 2  sets - 10 reps - 5 hold - Supine Quad Set  - 1 x daily - 7 x weekly - 2 sets - 10 reps - 5 hold - Supine Gluteal Sets  - 1 x daily - 7 x weekly - 2 sets - 10 reps - 5 hold - Proper Sit to Stand Technique with PLB  - 1 x daily - 7 x weekly - 2 sets - 10 reps - 30 hold - Sit to Stand with Counter Support  - 1 x daily - 7 x weekly - 2 sets - 10 reps - 5 hold  ASSESSMENT:   CLINICAL IMPRESSION: Patient able to work in the parallel bars today for standing, closed chain exercises to improve activity tolerance lower body strength including ankle.  She is quite down about her situation and the nerve damage, foot drop.  She states the doctor reassured her that the nerve is not damaged it is simply stretched and although it is unfortunate it will return with time.  Her active range of motion in her right knee is improved to about 100 degrees provided manual therapy today for pain relief and soft tissue mobilization.  She will continue to benefit from skilled physical therapy to maximize functional outcomes.  Patient is a 63 y.o. female who was seen today for physical therapy evaluation and treatment for Z96.651 (ICD-10-CM) - Status post total right knee replacement. Pt presents when min decreased AROM of the R knee for flexion and extension, decreased R LE strength, and walking with an antalgic gait pattern/slow pace. Following surgery, pt developed drop foot and uses a drop foot brace with ambulation. Pt will benefit from skilled PT 2w8 to address impairments to optimize R knee function for improved mobility..   OBJECTIVE IMPAIRMENTS: decreased activity tolerance, decreased balance,  difficulty walking, decreased ROM, decreased strength, increased edema, obesity, and pain.   ACTIVITY LIMITATIONS: carrying, lifting, bending, sitting, standing, squatting, sleeping, stairs, transfers, bed mobility, bathing, locomotion level, and caring for others  PARTICIPATION LIMITATIONS: meal prep, cleaning, laundry,  driving, shopping, and community activity  PERSONAL FACTORS: Fitness, Past/current experiences, Time since onset of injury/illness/exacerbation, and 3+ comorbidities: DDD- cervical and lumbar; L TKA 2018, High BMI are also affecting patient's functional outcome.   REHAB POTENTIAL: Good  CLINICAL DECISION MAKING: Evolving/moderate complexity  EVALUATION COMPLEXITY: Moderate   GOALS:  SHORT TERM GOALS: Target date: 04/23/24 Pt will be Ind in an initial HEP  Baseline:is inconsistent  Goal status: ongoing   2.  Increase R knee AROM to 7-100d Baseline: 15-80 (flexion met )  Goal status: ongoing   LONG TERM GOALS: Target date: 06/11/24  Pt will be Ind in a final HEP to maintain achieved LOF Baseline:  Goal status: INITIAL  2.  Increase R knee AROM  to 0-115d for functional mobility sitting and asc/dsc steps Baseline:  Goal status: INITIAL  3.  Improve 5xSTS by MCID of 5" and c RW by MCID of 43ft as indication of improved functional mobility  Baseline: TBA on 2nd viist Goal status: INITIAL  4.  Increase R knee strength to 4/5 and R hip to 3/5 for appropriate functional use with community ambulation Baseline:  Goal status: INITIAL  5.  Pt will be Ind with ambulation c a RW for 370' for community ambulation. With a LRAD as R drop foot improves. Baseline:  Goal status: INITIAL  6.  Pt will be able to asc/dsc 5 steps c assist c RW for community mobility Baseline:  Goal status: INITIAL   PLAN:  PT FREQUENCY: 2x/week  PT DURATION: 8 weeks  PLANNED INTERVENTIONS: 97164- PT Re-evaluation, 97110-Therapeutic exercises, 97530- Therapeutic activity, 97112- Neuromuscular re-education, 97535- Self Care, 44010- Manual therapy, [phone]- Gait training, [phone]- Electrical stimulation (unattended), 97016- Vasopneumatic device, Patient/Family education, Balance training, Stair training, Taping, Dry Needling, Joint mobilization, Cryotherapy, and Moist heat  PLAN FOR NEXT SESSION: patient  to bring in her ankle brace as well as her home exercise program to build upon continue to address activity tolerance knee active range of motion .assess response to HEP; progress therex as indicated; use of modalities, manual therapy; and TPDN as indicated.   Marci Setter, PT 04/23/24 11:06 AM Phone: [phone] Fax: [phone]

## 2024-04-27 ENCOUNTER — Ambulatory Visit: Admitting: Physical Therapy

## 2024-04-27 DIAGNOSIS — M25661 Stiffness of right knee, not elsewhere classified: Secondary | ICD-10-CM

## 2024-04-27 DIAGNOSIS — R262 Difficulty in walking, not elsewhere classified: Secondary | ICD-10-CM

## 2024-04-27 DIAGNOSIS — M25561 Pain in right knee: Secondary | ICD-10-CM | POA: Diagnosis not present

## 2024-04-27 DIAGNOSIS — M6281 Muscle weakness (generalized): Secondary | ICD-10-CM

## 2024-04-27 NOTE — Therapy (Signed)
 OUTPATIENT PHYSICAL THERAPY LOWER EXTREMITY NOTE   Patient Name: [name] MRN: [id number] DOB:[birth year], 63 y.o., female Today's Date: 04/27/2024  END OF SESSION:  PT End of Session - 04/27/24 1139     Visit Number 6    Number of Visits 17    Date for PT Re-Evaluation 06/11/24    Authorization Type [employer], M[employer]    Authorization - Visit Number 6    Authorization - Number of Visits 27    PT Start Time 1145    PT Stop Time 1230    PT Time Calculation (min) 45 min    Activity Tolerance Patient tolerated treatment well;Patient limited by pain    Behavior During Therapy WFL for tasks assessed/performed                 Past Medical History:  Diagnosis Date   Anxiety    Asthma    Back pain    Chronic pain    DDD (degenerative disc disease), lumbar    Degenerative disc disease    Degenerative disc disease, cervical    Degenerative disc disease, cervical    Fibroid    Fibromyalgia    GERD (gastroesophageal reflux disease)    Headache    regular   Heartburn    Hypertension    Joint pain    Localized swelling of both lower legs    Osteoarthritis    Other specified disorders of thyroid     Pre-diabetes    Rheumatoid arthritis (HCC)    Sleep apnea    mild, patient does not use mask   SOB (shortness of breath)    Past Surgical History:  Procedure Laterality Date   ANTERIOR CERVICAL DECOMP/DISCECTOMY FUSION  1998   C4-7   NOVASURE ABLATION     TOTAL KNEE ARTHROPLASTY Left 01/17/2017   Procedure: LEFT TOTAL KNEE ARTHROPLASTY;  Surgeon: Arnie Lao, MD;  Location: [facility];  Service: Orthopedics;  Laterality: Left;   TOTAL KNEE ARTHROPLASTY Right 03/19/2024   Procedure: ARTHROPLASTY, KNEE, TOTAL;  Surgeon: Arnie Lao, MD;  Location: [facility];  Service: Orthopedics;  Laterality: Right;   TUBAL LIGATION     Patient Active Problem List   Diagnosis Date Noted   Status post total right knee replacement 03/19/2024   Visit for routine gyn exam  02/25/2022   Vitamin D  deficiency 02/21/2021   Essential hypertension 02/21/2021   Pure hypercholesterolemia 02/21/2021   Class 3 severe obesity with serious comorbidity and body mass index (BMI) of 50.0 to 59.9 in adult 06/13/2020   Prediabetes 06/13/2020   Leg pain 06/13/2020   Status post total left knee replacement 01/17/2017   Degenerative disc disease, cervical     PCP: Charle Congo, MD   REFERRING PROVIDER: Arnie Lao, MD  REFERRING DIAG: [phone] (ICD-10-CM) - Status post total right knee replacement   THERAPY DIAG:  Acute pain of right knee  Muscle weakness (generalized)  Stiffness of right knee, not elsewhere classified  Difficulty in walking, not elsewhere classified  Rationale for Evaluation and Treatment: Rehabilitation  ONSET DATE: 03/19/24 DOS  SUBJECTIVE:   SUBJECTIVE STATEMENT:   Pt thinks it may be getting better but the foot is what is gets me down. I want to get a different brace.    PERTINENT HISTORY:  Last MD visit: There is no evidence infection at this standpoint so she can stop narcotics. There is still evidence of foot drop. Her x-rays last week of her right knee show well-seated total  knee arthroplasty with no complicating features.   DDD- cervical and lumbar; L TKA 2018, High BMI  Recent Md visit: The patient is a 63 year old patient well-known to us .  She is 2 weeks out from a right total knee replacement to treat severe right knee arthritis.  She is someone who is morbidly obese with a BMI of around 46.  She had severe end-stage arthritis of her right knee and we successfully replaced her left knee many years ago.  Postoperatively she does have foot drop.  Her surgery was very difficult given her obesity and certainly we likely stretched on some nerves leading to the foot drop.  I did let her know that usually these will resolve but it can take up to a year to resolve.  She does have AFO.   Sutures and staples been removed from  her right knee incision.  The very bottom of the incision has a little area that is raw and unguinal have mupirocin  only placed on this area daily.  The rest the incision looks good and Steri-Strips have been applied.  She has decent range of motion of her right knee for this first visit.   At this point we need to set her up for outpatient physical therapy upstairs for range of motion of her right knee and working on balance and coordination.  She will continue her AFO until her foot drop resolves.  I would like to see her back in just 2 weeks for a wound check of her incision.  She can stop any blood thinners from my standpoint as well.   Of note, she is in pain management and states that they will take over her pain medicine needs at this standpoint.   PAIN:  Are you having pain?Yes: NPRS scale: 5-6/10. Pain range the week before start of PT: 5-8/10 Pain location: R knee and Rt foot  Pain description: ache Aggravating factors: walking Relieving factors: ice machine, pain medication. Brace helps the foot.   PRECAUTIONS: None  RED FLAGS: None   WEIGHT BEARING RESTRICTIONS: None; WBAT  FALLS:  Has patient fallen in last 6 months? No  LIVING ENVIRONMENT: Lives with: lives with their family Lives in: House/apartment Stairs: 0 steps Has following equipment at home: Single point cane, Environmental consultant - 2 wheeled, Environmental consultant - 4 wheeled, shower chair, bed side commode, and Grab bars  OCCUPATION: Not working  PLOF: Independent with household mobility with device  PATIENT GOALS: Good use of my R knee  NEXT MD VISIT: 04/21/24 Dr. Lucienne Ryder  OBJECTIVE:  Note: Objective measures were completed at Evaluation unless otherwise noted.  PATIENT SURVEYS:  LEFS 15/80=19%  COGNITION: Overall cognitive status: Within functional limits for tasks assessed     SENSATION: Light touch: Impaired R foot  EDEMA:   Swelling present  MUSCLE LENGTH: Hamstrings: Right NT deg; Left NT deg Andy Bannister test: Right NT  deg; Left NT deg  POSTURE: increased lumbar lordosis and flexed trunk   PALPATION: TTP R lateral knee  LOWER EXTREMITY ROM:  Active ROM Right eval Left eval Rt  04/12/24 Rt 04/16/24 Rt 04/23/24  Hip flexion       Hip extension       Hip abduction       Hip adduction       Hip internal rotation       Hip external rotation       Knee flexion 80  92 deg AAROM  97d AAROM 104 deg AAROM  Knee extension 15 lacking  Lacking 6  Ankle dorsiflexion       Ankle plantarflexion       Ankle inversion       Ankle eversion        (Blank rows = not tested)  LOWER EXTREMITY MMT:  MMT Right eval Left eval  Hip flexion 2+   Hip extension 2+   Hip abduction    Hip adduction 2+   Hip internal rotation    Hip external rotation 2+   Knee flexion 3   Knee extension 3   Ankle dorsiflexion Drop foot   Ankle plantarflexion    Ankle inversion    Ankle eversion     (Blank rows = not tested)  LOWER EXTREMITY SPECIAL TESTS:  NT  FUNCTIONAL TESTS:  04/08/24: 5 times sit to stand: 24.76 sec used hands to pull up to walker  2 minute walk test: 81 feet, with RW   On her 2nd visit GAIT: Distance walked: 100' Assistive device utilized: Environmental consultant - 2 wheeled Level of assistance: Modified independence Comments: Antalgic gait pattern over R LE, slow pace, R drop foot, wears drop foot brace                                                                                                                                TREATMENT DATE:   [facility] Adult PT Treatment:                                                DATE: 04/27/24 Therapeutic Activity: NuStep LE and UE for 6 min  LAQ no wgt EOB  AAROM knee flexion  Sit to stand used chair in front x 10 , 2 sets  Standing hinge able to do without UE for 5 reps  Standing squat  Standing: weightshift, reducing UE assist  SAQ x 15 , 3 lbs  Rt knee extension/flexion thigh held with towel Extension prop Bridging 2 x 10  Declined ice   [facility] Adult PT  Treatment:                                                DATE: 04/23/24 Therapeutic Exercise: Seated LAQ Seated hamstring curl GTB x10  AAROM knee flexion x 5  Hamstring stretch x 30 sec Ball exercises: HS curl, Bridges  SLR x 10, knee bent  SAQ x 20  Manual Therapy: Soft tissue work to [employer] knee, quad and peri-patella, scar tissue  Therapeutic Activity: Parallel bars:sit to stand  x 10 Standing heel raises with heavy upper body support x 15 High knee march x 10 to each side Self Care: Encourage consistent performance of home exercise program, seated right knee and  ankle stretch using the strap Importance of stretching the right ankle into dorsiflexion in order to avoid contracture or persistent ankle stiffness  [facility] Adult PT Treatment:                                                DATE: 04/16/24 Therapeutic Exercise: NuStep 6 min L4 UE and LE  Supine AROM Rt knee 2 x 15 HS curl red band 2 x 15  LAQ with ball  2 x 15  STS x10 SLR 2x10 small ROM Knee flexion with strap x 5 to 97 deg  Bridging with bolster 2 x 10  Modalities: 15 min Vaso coldest setting light compression elevated  Measured pre and post: Rt knee 21.5 inches, post 21 inches     PATIENT EDUCATION:  Education details: use of ice vs heat.  Sleeping comfort/situation Person educated: Patient Education method: Explanation, Demonstration, Tactile cues, Verbal cues, and Handouts Education comprehension: verbalized understanding, returned demonstration, verbal cues required, and tactile cues required  HOME EXERCISE PROGRAM: Access Code: [email or web address] URL: [email or web address] Date: 04/12/2024 Prepared by: Marci Setter  Exercises - Seated Long Arc Quad  - 1 x daily - 7 x weekly - 2 sets - 10 reps - 5 hold - Seated Knee Flexion AAROM  - 1 x daily - 7 x weekly - 2 sets - 10 reps - 5 hold - Supine Heel Slide  - 1 x daily - 7 x weekly - 2 sets - 10 reps - 5 hold - Supine Quad Set  - 1 x daily - 7 x weekly - 2  sets - 10 reps - 5 hold - Supine Gluteal Sets  - 1 x daily - 7 x weekly - 2 sets - 10 reps - 5 hold - Proper Sit to Stand Technique with PLB  - 1 x daily - 7 x weekly - 2 sets - 10 reps - 30 hold - Sit to Stand with Counter Support  - 1 x daily - 7 x weekly - 2 sets - 10 reps - 5 hold  ASSESSMENT:   CLINICAL IMPRESSION: Patient was able to work a great deal in standing to improve functional strength and ability to stand from a low surface.  She did quite well and she was able to hinge without holding on.  Patient has improved her active range of motion of her right knee despite increased pain.  Rt measured 0-6-103 today.  She will continue to benefit from skilled physical therapy to improve active range of motion, strength and confidence.  She would like to be able to walk without the walker but is currently unsafe due to intermittent left knee pain and weakness as well as right foot drop.  Patient is a 63 y.o. female who was seen today for physical therapy evaluation and treatment for Z96.651 (ICD-10-CM) - Status post total right knee replacement. Pt presents when min decreased AROM of the R knee for flexion and extension, decreased R LE strength, and walking with an antalgic gait pattern/slow pace. Following surgery, pt developed drop foot and uses a drop foot brace with ambulation. Pt will benefit from skilled PT 2w8 to address impairments to optimize R knee function for improved mobility..   OBJECTIVE IMPAIRMENTS: decreased activity tolerance, decreased balance, difficulty walking, decreased ROM, decreased strength, increased edema, obesity, and pain.   ACTIVITY LIMITATIONS: carrying, lifting,  bending, sitting, standing, squatting, sleeping, stairs, transfers, bed mobility, bathing, locomotion level, and caring for others  PARTICIPATION LIMITATIONS: meal prep, cleaning, laundry, driving, shopping, and community activity  PERSONAL FACTORS: Fitness, Past/current experiences, Time since onset of  injury/illness/exacerbation, and 3+ comorbidities: DDD- cervical and lumbar; L TKA 2018, High BMI are also affecting patient's functional outcome.   REHAB POTENTIAL: Good  CLINICAL DECISION MAKING: Evolving/moderate complexity  EVALUATION COMPLEXITY: Moderate   GOALS:  SHORT TERM GOALS: Target date: 04/23/24 Pt will be Ind in an initial HEP  Baseline:is inconsistent  Goal status: ongoing   2.  Increase R knee AROM to 7-100d Baseline: 15-80 (flexion met )  Goal status: ongoing   LONG TERM GOALS: Target date: 06/11/24  Pt will be Ind in a final HEP to maintain achieved LOF Baseline:  Goal status: INITIAL  2.  Increase R knee AROM  to 0-115d for functional mobility sitting and asc/dsc steps Baseline:  Goal status: INITIAL  3.  Improve 5xSTS by MCID of 5" and c RW by MCID of 29ft as indication of improved functional mobility  Baseline: TBA on 2nd viist Goal status: INITIAL  4.  Increase R knee strength to 4/5 and R hip to 3/5 for appropriate functional use with community ambulation Baseline:  Goal status: INITIAL  5.  Pt will be Ind with ambulation c a RW for 370' for community ambulation. With a LRAD as R drop foot improves. Baseline:  Goal status: INITIAL  6.  Pt will be able to asc/dsc 5 steps c assist c RW for community mobility Baseline:  Goal status: INITIAL   PLAN:  PT FREQUENCY: 2x/week  PT DURATION: 8 weeks  PLANNED INTERVENTIONS: 97164- PT Re-evaluation, 97110-Therapeutic exercises, 97530- Therapeutic activity, [id number]- Neuromuscular re-education, 97535- Self Care, 16109- Manual therapy, [id number]- Gait training, [phone]- Electrical stimulation (unattended), 97016- Vasopneumatic device, Patient/Family education, Balance training, Stair training, Taping, Dry Needling, Joint mobilization, Cryotherapy, and Moist heat  PLAN FOR NEXT SESSION: continue to address activity tolerance knee active range of motion .assess response to HEP; progress therex as indicated; use  of modalities, manual therapy; and TPDN as indicated.    Marci Setter, PT 04/27/24 12:32 PM Phone: [phone] Fax: [phone]

## 2024-04-28 ENCOUNTER — Ambulatory Visit

## 2024-04-28 NOTE — Therapy (Incomplete)
 OUTPATIENT PHYSICAL THERAPY LOWER EXTREMITY NOTE   Patient Name: CESAR MUSACCHIA MRN: [id number] DOB:[birth year], 63 y.o., female Today's Date: 04/28/2024  END OF SESSION:        Past Medical History:  Diagnosis Date   Anxiety    Asthma    Back pain    Chronic pain    DDD (degenerative disc disease), lumbar    Degenerative disc disease    Degenerative disc disease, cervical    Degenerative disc disease, cervical    Fibroid    Fibromyalgia    GERD (gastroesophageal reflux disease)    Headache    regular   Heartburn    Hypertension    Joint pain    Localized swelling of both lower legs    Osteoarthritis    Other specified disorders of thyroid     Pre-diabetes    Rheumatoid arthritis (HCC)    Sleep apnea    mild, patient does not use mask   SOB (shortness of breath)    Past Surgical History:  Procedure Laterality Date   ANTERIOR CERVICAL DECOMP/DISCECTOMY FUSION  1998   C4-7   NOVASURE ABLATION     TOTAL KNEE ARTHROPLASTY Left 01/17/2017   Procedure: LEFT TOTAL KNEE ARTHROPLASTY;  Surgeon: Arnie Lao, MD;  Location: [facility];  Service: Orthopedics;  Laterality: Left;   TOTAL KNEE ARTHROPLASTY Right 03/19/2024   Procedure: ARTHROPLASTY, KNEE, TOTAL;  Surgeon: Arnie Lao, MD;  Location: [facility];  Service: Orthopedics;  Laterality: Right;   TUBAL LIGATION     Patient Active Problem List   Diagnosis Date Noted   Status post total right knee replacement 03/19/2024   Visit for routine gyn exam 02/25/2022   Vitamin D  deficiency 02/21/2021   Essential hypertension 02/21/2021   Pure hypercholesterolemia 02/21/2021   Class 3 severe obesity with serious comorbidity and body mass index (BMI) of 50.0 to 59.9 in adult 06/13/2020   Prediabetes 06/13/2020   Leg pain 06/13/2020   Status post total left knee replacement 01/17/2017   Degenerative disc disease, cervical     PCP: Charle Congo, MD   REFERRING PROVIDER: Arnie Lao,  MD  REFERRING DIAG: [phone] (ICD-10-CM) - Status post total right knee replacement   THERAPY DIAG:  No diagnosis found.  Rationale for Evaluation and Treatment: Rehabilitation  ONSET DATE: 03/19/24 DOS  SUBJECTIVE:   SUBJECTIVE STATEMENT:   Pt thinks it may be getting better but the foot is what is gets me down. I want to get a different brace.    PERTINENT HISTORY:  Last MD visit: There is no evidence infection at this standpoint so she can stop narcotics. There is still evidence of foot drop. Her x-rays last week of her right knee show well-seated total knee arthroplasty with no complicating features.   DDD- cervical and lumbar; L TKA 2018, High BMI  Recent Md visit: The patient is a 63 year old patient well-known to us .  She is 2 weeks out from a right total knee replacement to treat severe right knee arthritis.  She is someone who is morbidly obese with a BMI of around 46.  She had severe end-stage arthritis of her right knee and we successfully replaced her left knee many years ago.  Postoperatively she does have foot drop.  Her surgery was very difficult given her obesity and certainly we likely stretched on some nerves leading to the foot drop.  I did let her know that usually these will resolve but it can take up to a  year to resolve.  She does have AFO.   Sutures and staples been removed from her right knee incision.  The very bottom of the incision has a little area that is raw and unguinal have mupirocin  only placed on this area daily.  The rest the incision looks good and Steri-Strips have been applied.  She has decent range of motion of her right knee for this first visit.   At this point we need to set her up for outpatient physical therapy upstairs for range of motion of her right knee and working on balance and coordination.  She will continue her AFO until her foot drop resolves.  I would like to see her back in just 2 weeks for a wound check of her incision.  She can stop  any blood thinners from my standpoint as well.   Of note, she is in pain management and states that they will take over her pain medicine needs at this standpoint.   PAIN:  Are you having pain?Yes: NPRS scale: 5-6/10. Pain range the week before start of PT: 5-8/10 Pain location: R knee and Rt foot  Pain description: ache Aggravating factors: walking Relieving factors: ice machine, pain medication. Brace helps the foot.   PRECAUTIONS: None  RED FLAGS: None   WEIGHT BEARING RESTRICTIONS: None; WBAT  FALLS:  Has patient fallen in last 6 months? No  LIVING ENVIRONMENT: Lives with: lives with their family Lives in: House/apartment Stairs: 0 steps Has following equipment at home: Single point cane, Environmental consultant - 2 wheeled, Environmental consultant - 4 wheeled, shower chair, bed side commode, and Grab bars  OCCUPATION: Not working  PLOF: Independent with household mobility with device  PATIENT GOALS: Good use of my R knee  NEXT MD VISIT: 04/21/24 Dr. Lucienne Ryder  OBJECTIVE:  Note: Objective measures were completed at Evaluation unless otherwise noted.  PATIENT SURVEYS:  LEFS 15/80=19%  COGNITION: Overall cognitive status: Within functional limits for tasks assessed     SENSATION: Light touch: Impaired R foot  EDEMA:   Swelling present  MUSCLE LENGTH: Hamstrings: Right NT deg; Left NT deg Andy Bannister test: Right NT deg; Left NT deg  POSTURE: increased lumbar lordosis and flexed trunk   PALPATION: TTP R lateral knee  LOWER EXTREMITY ROM:  Active ROM Right eval Left eval Rt  04/12/24 Rt 04/16/24 Rt 04/23/24  Hip flexion       Hip extension       Hip abduction       Hip adduction       Hip internal rotation       Hip external rotation       Knee flexion 80  92 deg AAROM  97d AAROM 104 deg AAROM  Knee extension 15 lacking    Lacking 6  Ankle dorsiflexion       Ankle plantarflexion       Ankle inversion       Ankle eversion        (Blank rows = not tested)  LOWER EXTREMITY MMT:  MMT  Right eval Left eval  Hip flexion 2+   Hip extension 2+   Hip abduction    Hip adduction 2+   Hip internal rotation    Hip external rotation 2+   Knee flexion 3   Knee extension 3   Ankle dorsiflexion Drop foot   Ankle plantarflexion    Ankle inversion    Ankle eversion     (Blank rows = not tested)  LOWER EXTREMITY SPECIAL TESTS:  NT  FUNCTIONAL TESTS:  04/08/24: 5 times sit to stand: 24.76 sec used hands to pull up to walker  2 minute walk test: 81 feet, with RW   On her 2nd visit GAIT: Distance walked: 100' Assistive device utilized: Environmental consultant - 2 wheeled Level of assistance: Modified independence Comments: Antalgic gait pattern over R LE, slow pace, R drop foot, wears drop foot brace                                                                                                                                TREATMENT DATE:  [facility] Adult PT Treatment:                                                DATE: 04/29/24 Therapeutic Activity: NuStep LE and UE for 6 min  LAQ no wgt EOB  AAROM knee flexion  Sit to stand used chair in front x 10 , 2 sets  Standing hinge able to do without UE for 5 reps  Standing squat  Standing: weightshift, reducing UE assist  SAQ x 15 , 3 lbs  Rt knee extension/flexion thigh held with towel Extension prop Bridging 2 x 10  Declined ice  Therapeutic Exercise: *** Manual Therapy: *** Neuromuscular re-ed: *** Therapeutic Activity: *** Modalities: *** Self Care: ***   [facility] Adult PT Treatment:                                                DATE: 04/27/24 Therapeutic Activity: NuStep LE and UE for 6 min  LAQ no wgt EOB  AAROM knee flexion  Sit to stand used chair in front x 10 , 2 sets  Standing hinge able to do without UE for 5 reps  Standing squat  Standing: weightshift, reducing UE assist  SAQ x 15 , 3 lbs  Rt knee extension/flexion thigh held with towel Extension prop Bridging 2 x 10  Declined ice   [facility] Adult PT Treatment:                                                 DATE: 04/23/24 Therapeutic Exercise: Seated LAQ Seated hamstring curl GTB x10  AAROM knee flexion x 5  Hamstring stretch x 30 sec Ball exercises: HS curl, Bridges  SLR x 10, knee bent  SAQ x 20  Manual Therapy: Soft tissue work to [employer] knee, quad and peri-patella, scar tissue  Therapeutic Activity: Parallel bars:sit to stand  x 10 Standing heel raises with heavy upper body support x 15 High  knee march x 10 to each side Self Care: Encourage consistent performance of home exercise program, seated right knee and ankle stretch using the strap Importance of stretching the right ankle into dorsiflexion in order to avoid contracture or persistent ankle stiffness    PATIENT EDUCATION:  Education details: use of ice vs heat.  Sleeping comfort/situation Person educated: Patient Education method: Explanation, Demonstration, Tactile cues, Verbal cues, and Handouts Education comprehension: verbalized understanding, returned demonstration, verbal cues required, and tactile cues required  HOME EXERCISE PROGRAM: Access Code: [email or web address] URL: [email or web address] Date: 04/12/2024 Prepared by: Marci Setter  Exercises - Seated Long Arc Quad  - 1 x daily - 7 x weekly - 2 sets - 10 reps - 5 hold - Seated Knee Flexion AAROM  - 1 x daily - 7 x weekly - 2 sets - 10 reps - 5 hold - Supine Heel Slide  - 1 x daily - 7 x weekly - 2 sets - 10 reps - 5 hold - Supine Quad Set  - 1 x daily - 7 x weekly - 2 sets - 10 reps - 5 hold - Supine Gluteal Sets  - 1 x daily - 7 x weekly - 2 sets - 10 reps - 5 hold - Proper Sit to Stand Technique with PLB  - 1 x daily - 7 x weekly - 2 sets - 10 reps - 30 hold - Sit to Stand with Counter Support  - 1 x daily - 7 x weekly - 2 sets - 10 reps - 5 hold  ASSESSMENT:   CLINICAL IMPRESSION: Patient was able to work a great deal in standing to improve functional strength and ability to stand from a low surface.  She did  quite well and she was able to hinge without holding on.  Patient has improved her active range of motion of her right knee despite increased pain.  Rt measured 0-6-103 today.  She will continue to benefit from skilled physical therapy to improve active range of motion, strength and confidence.  She would like to be able to walk without the walker but is currently unsafe due to intermittent left knee pain and weakness as well as right foot drop.  Patient is a 64 y.o. female who was seen today for physical therapy evaluation and treatment for Z96.651 (ICD-10-CM) - Status post total right knee replacement. Pt presents when min decreased AROM of the R knee for flexion and extension, decreased R LE strength, and walking with an antalgic gait pattern/slow pace. Following surgery, pt developed drop foot and uses a drop foot brace with ambulation. Pt will benefit from skilled PT 2w8 to address impairments to optimize R knee function for improved mobility..   OBJECTIVE IMPAIRMENTS: decreased activity tolerance, decreased balance, difficulty walking, decreased ROM, decreased strength, increased edema, obesity, and pain.   ACTIVITY LIMITATIONS: carrying, lifting, bending, sitting, standing, squatting, sleeping, stairs, transfers, bed mobility, bathing, locomotion level, and caring for others  PARTICIPATION LIMITATIONS: meal prep, cleaning, laundry, driving, shopping, and community activity  PERSONAL FACTORS: Fitness, Past/current experiences, Time since onset of injury/illness/exacerbation, and 3+ comorbidities: DDD- cervical and lumbar; L TKA 2018, High BMI are also affecting patient's functional outcome.   REHAB POTENTIAL: Good  CLINICAL DECISION MAKING: Evolving/moderate complexity  EVALUATION COMPLEXITY: Moderate   GOALS:  SHORT TERM GOALS: Target date: 04/23/24 Pt will be Ind in an initial HEP  Baseline:is inconsistent  Goal status: ongoing   2.  Increase R knee AROM to 7-100d Baseline: 15-80  (flexion met )  Goal status: ongoing   LONG TERM GOALS: Target date: 06/11/24  Pt will be Ind in a final HEP to maintain achieved LOF Baseline:  Goal status: INITIAL  2.  Increase R knee AROM  to 0-115d for functional mobility sitting and asc/dsc steps Baseline:  Goal status: INITIAL  3.  Improve 5xSTS by MCID of 5" and c RW by MCID of 23ft as indication of improved functional mobility  Baseline: TBA on 2nd viist Goal status: INITIAL  4.  Increase R knee strength to 4/5 and R hip to 3/5 for appropriate functional use with community ambulation Baseline:  Goal status: INITIAL  5.  Pt will be Ind with ambulation c a RW for 370' for community ambulation. With a LRAD as R drop foot improves. Baseline:  Goal status: INITIAL  6.  Pt will be able to asc/dsc 5 steps c assist c RW for community mobility Baseline:  Goal status: INITIAL   PLAN:  PT FREQUENCY: 2x/week  PT DURATION: 8 weeks  PLANNED INTERVENTIONS: 97164- PT Re-evaluation, 97110-Therapeutic exercises, 97530- Therapeutic activity, [id number]- Neuromuscular re-education, 97535- Self Care, [id number]- Manual therapy, [id number]- Gait training, [phone]- Electrical stimulation (unattended), 97016- Vasopneumatic device, Patient/Family education, Balance training, Stair training, Taping, Dry Needling, Joint mobilization, Cryotherapy, and Moist heat  PLAN FOR NEXT SESSION: continue to address activity tolerance knee active range of motion .assess response to HEP; progress therex as indicated; use of modalities, manual therapy; and TPDN as indicated.   Ara Grandmaison MS, PT 04/28/24 7:39 AM

## 2024-04-29 ENCOUNTER — Other Ambulatory Visit: Payer: Self-pay

## 2024-04-29 ENCOUNTER — Ambulatory Visit

## 2024-04-29 ENCOUNTER — Telehealth: Payer: Self-pay | Admitting: Orthopaedic Surgery

## 2024-04-29 DIAGNOSIS — Z96651 Presence of right artificial knee joint: Secondary | ICD-10-CM

## 2024-04-29 NOTE — Telephone Encounter (Signed)
 Referral placed.

## 2024-04-29 NOTE — Telephone Encounter (Signed)
 Patient called and said she don't think she making any progress there on [address] for PT. She wants to know if you could do a referral to the therapy place [place] [address]. CB#[phone]

## 2024-05-03 ENCOUNTER — Ambulatory Visit: Admitting: Orthopaedic Surgery

## 2024-05-03 ENCOUNTER — Telehealth: Payer: Self-pay

## 2024-05-03 DIAGNOSIS — M21371 Foot drop, right foot: Secondary | ICD-10-CM

## 2024-05-03 DIAGNOSIS — Z96651 Presence of right artificial knee joint: Secondary | ICD-10-CM

## 2024-05-03 MED ORDER — PREGABALIN 100 MG PO CAPS: 100.0000 mg | ORAL_CAPSULE | Freq: Two times a day (BID) | ORAL | 0 refills | Status: DC

## 2024-05-03 NOTE — Progress Notes (Signed)
 The patient is here in continued follow-up as it relates to her right total knee arthroplasty.  Her right total knee was quite difficult given her morbid obesity.  She is also having significant issues with pain but she is in chronic pain management and has been on oxycodone  20 mg multiple times a day for years now.  Her postoperative course has also been complicated by foot drop on the right side.  She is ambulating with a rolling walker.  She feels like the knee may be unstable.  On my exam the knee feels stable.  She is healing a distal wound that looks like it is healing nicely.  She has been putting Bactroban  ointment on this daily and she will continue to do so.  She still has foot drop to be expected.  I did give her a prescription for an AFO at the [facility] because the one from the hospital is not fitting well.  I have encouraged her to continue physical therapy.  We tried gabapentin  for her neurologic symptoms as it relates to foot drop and that is not working for her even up to 300 mg 3 times a day.  She is off of that now we tried gabapentin  for her neurologic symptoms as it relates to foot drop and that is not working for her even up to 300 mg 3 times a day.  She is off of that now so the neck step is trying Lyrica  to see if that will help.  I will send in 100 mg of Lyrica  twice daily.  This is a higher dose given her morbid obesity as well.  Will see her at her regular scheduled visit next month and will have a standing AP and lateral of her right knee at that visit.

## 2024-05-03 NOTE — Telephone Encounter (Signed)
 Patient called triage.She is having continue pain since her TKA  on 03/19/2024. She wants to be seen today. I have scheduled patient in an opening for today with Dr.Blackman.

## 2024-05-04 ENCOUNTER — Ambulatory Visit: Admitting: Physical Therapy

## 2024-05-04 DIAGNOSIS — M6281 Muscle weakness (generalized): Secondary | ICD-10-CM

## 2024-05-04 DIAGNOSIS — M25561 Pain in right knee: Secondary | ICD-10-CM

## 2024-05-04 DIAGNOSIS — R262 Difficulty in walking, not elsewhere classified: Secondary | ICD-10-CM

## 2024-05-04 DIAGNOSIS — M25661 Stiffness of right knee, not elsewhere classified: Secondary | ICD-10-CM

## 2024-05-04 NOTE — Therapy (Signed)
 OUTPATIENT PHYSICAL THERAPY LOWER EXTREMITY NOTE   Patient Name: [name] MRN: [id number] DOB:26-Jul-1961, 63 y.o., female Today's Date: 05/04/2024  END OF SESSION:  PT End of Session - 05/04/24 1121     Visit Number 7    Number of Visits 17    Date for PT Re-Evaluation 06/11/24    Authorization Type [employer], MCD    Authorization - Visit Number 7    Authorization - Number of Visits 27    PT Start Time 1130    PT Stop Time 1221    PT Time Calculation (min) 51 min                  Past Medical History:  Diagnosis Date   Anxiety    Asthma    Back pain    Chronic pain    DDD (degenerative disc disease), lumbar    Degenerative disc disease    Degenerative disc disease, cervical    Degenerative disc disease, cervical    Fibroid    Fibromyalgia    GERD (gastroesophageal reflux disease)    Headache    regular   Heartburn    Hypertension    Joint pain    Localized swelling of both lower legs    Osteoarthritis    Other specified disorders of thyroid     Pre-diabetes    Rheumatoid arthritis (HCC)    Sleep apnea    mild, patient does not use mask   SOB (shortness of breath)    Past Surgical History:  Procedure Laterality Date   ANTERIOR CERVICAL DECOMP/DISCECTOMY FUSION  1998   C4-7   NOVASURE ABLATION     TOTAL KNEE ARTHROPLASTY Left 01/17/2017   Procedure: LEFT TOTAL KNEE ARTHROPLASTY;  Surgeon: Arnie Lao, MD;  Location: [facility];  Service: Orthopedics;  Laterality: Left;   TOTAL KNEE ARTHROPLASTY Right 03/19/2024   Procedure: ARTHROPLASTY, KNEE, TOTAL;  Surgeon: Arnie Lao, MD;  Location: [facility];  Service: Orthopedics;  Laterality: Right;   TUBAL LIGATION     Patient Active Problem List   Diagnosis Date Noted   Status post total right knee replacement 03/19/2024   Visit for routine gyn exam 02/25/2022   Vitamin D  deficiency 02/21/2021   Essential hypertension 02/21/2021   Pure hypercholesterolemia 02/21/2021   Class 3 severe  obesity with serious comorbidity and body mass index (BMI) of 50.0 to 59.9 in adult 06/13/2020   Prediabetes 06/13/2020   Leg pain 06/13/2020   Status post total left knee replacement 01/17/2017   Degenerative disc disease, cervical     PCP: Charle Congo, MD   REFERRING PROVIDER: Arnie Lao, MD  REFERRING DIAG: [phone] (ICD-10-CM) - Status post total right knee replacement   THERAPY DIAG:  Acute pain of right knee  Muscle weakness (generalized)  Stiffness of right knee, not elsewhere classified  Difficulty in walking, not elsewhere classified  Rationale for Evaluation and Treatment: Rehabilitation  ONSET DATE: 03/19/24 DOS  SUBJECTIVE:   SUBJECTIVE STATEMENT:  Saw MD yesterday because it was such pain.  Today I have min to no pain .  Pt went to [facility] and will be getting an AFO.  Lyrica  really helped.   PERTINENT HISTORY:  DDD- cervical and lumbar; L TKA 2018, High BMI  Recent MD visit 5/19: I did give her a prescription for an AFO at the [facility] clinic because the one from the hospital is not fitting well.  PAIN:  Are you having pain?Yes: NPRS scale: 5-6/10. Pain  range the week before start of PT: 5-8/10 Pain location: R knee and Rt foot  Pain description: ache Aggravating factors: walking Relieving factors: ice machine, pain medication. Brace helps the foot.   PRECAUTIONS: None  RED FLAGS: None   WEIGHT BEARING RESTRICTIONS: None; WBAT  FALLS:  Has patient fallen in last 6 months? No  LIVING ENVIRONMENT: Lives with: lives with their family Lives in: House/apartment Stairs: 0 steps Has following equipment at home: Single point cane, Environmental consultant - 2 wheeled, Environmental consultant - 4 wheeled, shower chair, bed side commode, and Grab bars  OCCUPATION: Not working  PLOF: Independent with household mobility with device  PATIENT GOALS: Good use of my R knee  NEXT MD VISIT: 04/21/24 Dr. Lucienne Ryder  OBJECTIVE:  Note: Objective measures were completed at Evaluation  unless otherwise noted.  PATIENT SURVEYS:  LEFS 15/80=19%  COGNITION: Overall cognitive status: Within functional limits for tasks assessed     SENSATION: Light touch: Impaired R foot  EDEMA:   Swelling present  MUSCLE LENGTH: Hamstrings: Right NT deg; Left NT deg Andy Bannister test: Right NT deg; Left NT deg  POSTURE: increased lumbar lordosis and flexed trunk   PALPATION: TTP R lateral knee  LOWER EXTREMITY ROM:  Active ROM Right eval Left eval Rt  04/12/24 Rt 04/16/24 Rt 04/23/24  Hip flexion       Hip extension       Hip abduction       Hip adduction       Hip internal rotation       Hip external rotation       Knee flexion 80  92 deg AAROM  97d AAROM 104 deg AAROM  Knee extension 15 lacking    Lacking 6  Ankle dorsiflexion       Ankle plantarflexion       Ankle inversion       Ankle eversion        (Blank rows = not tested)  LOWER EXTREMITY MMT:  MMT Right eval Left eval  Hip flexion 2+   Hip extension 2+   Hip abduction    Hip adduction 2+   Hip internal rotation    Hip external rotation 2+   Knee flexion 3   Knee extension 3   Ankle dorsiflexion Drop foot   Ankle plantarflexion    Ankle inversion    Ankle eversion     (Blank rows = not tested)  LOWER EXTREMITY SPECIAL TESTS:  NT  FUNCTIONAL TESTS:  04/08/24: 5 times sit to stand: 24.76 sec used hands to pull up to walker  2 minute walk test: 81 feet, with RW   On her 2nd visit GAIT: Distance walked: 100' Assistive device utilized: Environmental consultant - 2 wheeled Level of assistance: Modified independence Comments: Antalgic gait pattern over R LE, slow pace, R drop foot, wears drop foot brace  TREATMENT DATE:   [facility] Adult PT Treatment:                                                DATE: 05/04/24 Therapeutic Activity: NuStep LE and UE for 6 min  LAQ 4 lbs wgt EOB  SAQ x 15 , 3  lbs  Bridging 2 x 10  Rt knee extension stretching with strap 30 sec x 3 , ankle propped on bolster AAROM knee flexion (104 deg) March Rt LE  Standing hip abd, extension x 15 Rt LE  High knee march  Weight shifting on Airex, narrow, min UE support , needed seated rest breaks     [facility] Adult PT Treatment:                                                DATE: 04/27/24 Therapeutic Activity: NuStep LE and UE for 6 min  LAQ no wgt EOB  AAROM knee flexion  Sit to stand used chair in front x 10 , 2 sets  Standing hinge able to do without UE for 5 reps  Standing squat  Standing: weightshift, reducing UE assist  SAQ x 15 , 3 lbs  Rt knee extension/flexion thigh held with towel Extension prop Bridging 2 x 10  Declined ice   [facility] Adult PT Treatment:                                                DATE: 04/23/24 Therapeutic Exercise: Seated LAQ Seated hamstring curl GTB x10  AAROM knee flexion x 5  Hamstring stretch x 30 sec Ball exercises: HS curl, Bridges  SLR x 10, knee bent  SAQ x 20  Manual Therapy: Soft tissue work to [employer] knee, quad and peri-patella, scar tissue  Therapeutic Activity: Parallel bars:sit to stand  x 10 Standing heel raises with heavy upper body support x 15 High knee march x 10 to each side Self Care: Encourage consistent performance of home exercise p, dynamic strenuous rogram, seated right knee and ankle stretch using the strap Importance of stretching the right ankle into dorsiflexion in order to avoid contracture or persistent ankle stiffness    PATIENT EDUCATION:  Education details: use of ice vs heat.  Sleeping comfort/situation Person educated: Patient Education method: Explanation, Demonstration, Tactile cues, Verbal cues, and Handouts Education comprehension: verbalized understanding, returned demonstration, verbal cues required, and tactile cues required  HOME EXERCISE PROGRAM: Access Code: [email or web address] URL: [email or web address] Date:  04/12/2024 Prepared by: Marci Setter  Exercises - Seated Long Arc Quad  - 1 x daily - 7 x weekly - 2 sets - 10 reps - 5 hold - Seated Knee Flexion AAROM  - 1 x daily - 7 x weekly - 2 sets - 10 reps - 5 hold - Supine Heel Slide  - 1 x daily - 7 x weekly - 2 sets - 10 reps - 5 hold - Supine Quad Set  - 1 x daily - 7 x weekly - 2 sets - 10 reps - 5 hold - Supine Gluteal Sets  - 1 x daily - 7  x weekly - 2 sets - 10 reps - 5 hold - Proper Sit to Stand Technique with PLB  - 1 x daily - 7 x weekly - 2 sets - 10 reps - 30 hold - Sit to Stand with Counter Support  - 1 x daily - 7 x weekly - 2 sets - 10 reps - 5 hold  ASSESSMENT:   CLINICAL IMPRESSION: Patients new medication has improved her pain.  She continues to improve her active range of motion in the right knee and swelling is also improved.  Patient is having transportation issues and may need to attend PT location closer to her home.  She does plan on coming Thursday to her next appointment.  Patient did not trust her right leg to hold her up and standing in order to work on strength.  Explained how her hip weakness plays a role in her ability to do this.  Her quad strength is good but certainly not where it needs to be.  She is unable to perform a straight leg raise on the right side.  She will continue to benefit from skilled physical therapy and will improve her gait once the AFO is received.   Patient is a 63 y.o. female who was seen today for physical therapy evaluation and treatment for Z96.651 (ICD-10-CM) - Status post total right knee replacement. Pt presents when min decreased AROM of the R knee for flexion and extension, decreased R LE strength, and walking with an antalgic gait pattern/slow pace. Following surgery, pt developed drop foot and uses a drop foot brace with ambulation. Pt will benefit from skilled PT 2w8 to address impairments to optimize R knee function for improved mobility..   OBJECTIVE IMPAIRMENTS: decreased activity  tolerance, decreased balance, difficulty walking, decreased ROM, decreased strength, increased edema, obesity, and pain.   ACTIVITY LIMITATIONS: carrying, lifting, bending, sitting, standing, squatting, sleeping, stairs, transfers, bed mobility, bathing, locomotion level, and caring for others  PARTICIPATION LIMITATIONS: meal prep, cleaning, laundry, driving, shopping, and community activity  PERSONAL FACTORS: Fitness, Past/current experiences, Time since onset of injury/illness/exacerbation, and 3+ comorbidities: DDD- cervical and lumbar; L TKA 2018, High BMI are also affecting patient's functional outcome.   REHAB POTENTIAL: Good  CLINICAL DECISION MAKING: Evolving/moderate complexity  EVALUATION COMPLEXITY: Moderate   GOALS:  SHORT TERM GOALS: Target date: 04/23/24 Pt will be Ind in an initial HEP  Baseline:is inconsistent  Goal status: ongoing   2.  Increase R knee AROM to 7-100d Baseline: 15-80 (flexion met )  Goal status: ongoing   LONG TERM GOALS: Target date: 06/11/24  Pt will be Ind in a final HEP to maintain achieved LOF Baseline:  Goal status: INITIAL  2.  Increase R knee AROM  to 0-115d for functional mobility sitting and asc/dsc steps Baseline:  Goal status: INITIAL  3.  Improve 5xSTS by MCID of 5" and c RW by MCID of 44ft as indication of improved functional mobility  Baseline: TBA on 2nd viist Goal status: INITIAL  4.  Increase R knee strength to 4/5 and R hip to 3/5 for appropriate functional use with community ambulation Baseline:  Goal status: INITIAL  5.  Pt will be Ind with ambulation c a RW for 370' for community ambulation. With a LRAD as R drop foot improves. Baseline:  Goal status: INITIAL  6.  Pt will be able to asc/dsc 5 steps c assist c RW for community mobility Baseline:  Goal status: INITIAL   PLAN:  PT FREQUENCY: 2x/week  PT DURATION: 8 weeks  PLANNED INTERVENTIONS: 97164- PT Re-evaluation, 97110-Therapeutic exercises, 97530-  Therapeutic activity, [id number]- Neuromuscular re-education, 97535- Self Care, [id number]- Manual therapy, [phone]- Gait training, [phone]- Electrical stimulation (unattended), 97016- Vasopneumatic device, Patient/Family education, Balance training, Stair training, Taping, Dry Needling, Joint mobilization, Cryotherapy, and Moist heat  PLAN FOR NEXT SESSION: c check goals and continue to address activity tolerance knee active range of motion .assess response to HEP; progress therex as indicated; use of modalities, manual therapy; and TPDN as indicated.   Marci Setter, PT 05/04/24 12:24 PM Phone: [phone] Fax: [phone]

## 2024-05-06 ENCOUNTER — Encounter: Admitting: Physical Therapy

## 2024-05-06 ENCOUNTER — Ambulatory Visit: Admitting: Physical Therapy

## 2024-05-06 DIAGNOSIS — M25661 Stiffness of right knee, not elsewhere classified: Secondary | ICD-10-CM

## 2024-05-06 DIAGNOSIS — M25561 Pain in right knee: Secondary | ICD-10-CM | POA: Diagnosis not present

## 2024-05-06 DIAGNOSIS — M6281 Muscle weakness (generalized): Secondary | ICD-10-CM

## 2024-05-06 DIAGNOSIS — R262 Difficulty in walking, not elsewhere classified: Secondary | ICD-10-CM

## 2024-05-06 NOTE — Therapy (Addendum)
 OUTPATIENT PHYSICAL THERAPY LOWER EXTREMITY NOTE   Patient Name: [name] MRN: [id number] DOB:[birth year], 63 y.o., female Today's Date: 05/06/2024    PHYSICAL THERAPY DISCHARGE SUMMARY  Visits from Start of Care: 8  Current functional level related to goals / functional outcomes: Did not return, DVT?   Remaining deficits: Unknown    Education / Equipment: HEP, knee ROM, swelling    Patient agrees to discharge. Patient goals were not met. Patient is being discharged due to not returning since the last visit.    END OF SESSION:  PT End of Session - 05/06/24 1247     Visit Number 8    Number of Visits 17    Date for PT Re-Evaluation 06/11/24    Authorization Type [employer], MCD    Authorization - Visit Number 8    Authorization - Number of Visits 27    PT Start Time 1242    PT Stop Time 1317    PT Time Calculation (min) 35 min    Activity Tolerance Patient tolerated treatment well;Patient limited by pain    Behavior During Therapy WFL for tasks assessed/performed                   Past Medical History:  Diagnosis Date   Anxiety    Asthma    Back pain    Chronic pain    DDD (degenerative disc disease), lumbar    Degenerative disc disease    Degenerative disc disease, cervical    Degenerative disc disease, cervical    Fibroid    Fibromyalgia    GERD (gastroesophageal reflux disease)    Headache    regular   Heartburn    Hypertension    Joint pain    Localized swelling of both lower legs    Osteoarthritis    Other specified disorders of thyroid     Pre-diabetes    Rheumatoid arthritis (HCC)    Sleep apnea    mild, patient does not use mask   SOB (shortness of breath)    Past Surgical History:  Procedure Laterality Date   ANTERIOR CERVICAL DECOMP/DISCECTOMY FUSION  1998   C4-7   NOVASURE ABLATION     TOTAL KNEE ARTHROPLASTY Left 01/17/2017   Procedure: LEFT TOTAL KNEE ARTHROPLASTY;  Surgeon: Lonni CINDERELLA Poli, MD;  Location: [facility];   Service: Orthopedics;  Laterality: Left;   TOTAL KNEE ARTHROPLASTY Right 03/19/2024   Procedure: ARTHROPLASTY, KNEE, TOTAL;  Surgeon: Poli Lonni CINDERELLA, MD;  Location: [facility];  Service: Orthopedics;  Laterality: Right;   TUBAL LIGATION     Patient Active Problem List   Diagnosis Date Noted   Status post total right knee replacement 03/19/2024   Visit for routine gyn exam 02/25/2022   Vitamin D  deficiency 02/21/2021   Essential hypertension 02/21/2021   Pure hypercholesterolemia 02/21/2021   Class 3 severe obesity with serious comorbidity and body mass index (BMI) of 50.0 to 59.9 in adult 06/13/2020   Prediabetes 06/13/2020   Leg pain 06/13/2020   Status post total left knee replacement 01/17/2017   Degenerative disc disease, cervical     PCP: Shelda Atlas, MD   REFERRING PROVIDER: Poli Lonni CINDERELLA, MD  REFERRING DIAG: [phone] (ICD-10-CM) - Status post total right knee replacement   THERAPY DIAG:  Acute pain of right knee  Muscle weakness (generalized)  Stiffness of right knee, not elsewhere classified  Difficulty in walking, not elsewhere classified  Rationale for Evaluation and Treatment: Rehabilitation  ONSET DATE: 03/19/24 DOS  SUBJECTIVE:   SUBJECTIVE STATEMENT: The [employer]  is really helping. Pain is 3/10.  Arrived a bit late.   PERTINENT HISTORY:  DDD- cervical and lumbar; L TKA 2018, High BMI  Recent MD visit 5/19: I did give her a prescription for an AFO at the [facility] clinic because the one from the hospital is not fitting well.  PAIN:  Are you having pain?Yes: NPRS scale: 3/10-4/10.  Pain range the week before start of PT: 5-8/10 Pain location: R knee and Rt foot  Pain description: ache Aggravating factors: walking Relieving factors: ice machine, pain medication. Brace helps the foot.   PRECAUTIONS: None  RED FLAGS: None   WEIGHT BEARING RESTRICTIONS: None; WBAT  FALLS:  Has patient fallen in last 6 months? No  LIVING  ENVIRONMENT: Lives with: lives with their family Lives in: House/apartment Stairs: 0 steps Has following equipment at home: Single point cane, Environmental consultant - 2 wheeled, Environmental consultant - 4 wheeled, shower chair, bed side commode, and Grab bars  OCCUPATION: Not working  PLOF: Independent with household mobility with device  PATIENT GOALS: Good use of my R knee  NEXT MD VISIT: 04/21/24 Dr. Vernetta  OBJECTIVE:  Note: Objective measures were completed at Evaluation unless otherwise noted.  PATIENT SURVEYS:  LEFS 15/80=19%  COGNITION: Overall cognitive status: Within functional limits for tasks assessed     SENSATION: Light touch: Impaired R foot  EDEMA:   Swelling present  MUSCLE LENGTH: Hamstrings: Right NT deg; Left NT deg Debby test: Right NT deg; Left NT deg  POSTURE: increased lumbar lordosis and flexed trunk   PALPATION: TTP R lateral knee  LOWER EXTREMITY ROM:  Active ROM Right eval Rt  04/12/24 Rt 04/16/24 Rt 04/23/24  Hip flexion      Hip extension      Hip abduction      Hip adduction      Hip internal rotation      Hip external rotation      Knee flexion 80 92 deg AAROM  97d AAROM 104 deg AAROM  Knee extension 15 lacking   Lacking 6  Ankle dorsiflexion      Ankle plantarflexion      Ankle inversion      Ankle eversion       (Blank rows = not tested)  LOWER EXTREMITY MMT:  MMT Right eval Rt 05/06/24 Lt.  05/06/24  Hip flexion 2+ 3- 3  Hip extension 2+    Hip abduction     Hip adduction 2+    Hip internal rotation     Hip external rotation 2+    Knee flexion 3 4+ 4+  Knee extension 3 3+ 4+  Ankle dorsiflexion Drop foot    Ankle plantarflexion     Ankle inversion     Ankle eversion      (Blank rows = not tested)  LOWER EXTREMITY SPECIAL TESTS:  NT  FUNCTIONAL TESTS:  04/08/24: 5 times sit to stand: 24.76 sec used hands to pull up to walker  2 minute walk test: 81 feet, with RW   On her 2nd visit GAIT: Distance walked: 100' Assistive device  utilized: Environmental consultant - 2 wheeled Level of assistance: Modified independence Comments: Antalgic gait pattern over R LE, slow pace, R drop foot, wears drop foot brace  TREATMENT DATE:   [facility] Adult PT Treatment:                                                DATE: 05/06/24  Therapeutic Activity: Standing exercises for activity tolerance: step ups , step taps Squats x 10  Sit to stand x 10 no UEs to countertop Sit to stand to march x 10 with countertop  Supine knee ROM flexion to 105 PROM and ext lacking 8 Quad set  x 10  SLR unable  March  x 10 with and without band  Clam x blue band  Bridge x 10  SL clam blue band x 10 manual assist to maintain level hips    [facility] Adult PT Treatment:                                                DATE: 05/04/24 Therapeutic Activity: NuStep LE and UE for 6 min  LAQ 4 lbs wgt EOB  SAQ x 15 , 3 lbs  Bridging 2 x 10  Rt knee extension stretching with strap 30 sec x 3 , ankle propped on bolster AAROM knee flexion (104 deg) March Rt LE  Standing hip abd, extension x 15 Rt LE  High knee march  Weight shifting on Airex, narrow, min UE support , needed seated rest breaks     [facility] Adult PT Treatment:                                                DATE: 04/27/24 Therapeutic Activity: NuStep LE and UE for 6 min  LAQ no wgt EOB  AAROM knee flexion  Sit to stand used chair in front x 10 , 2 sets  Standing hinge able to do without UE for 5 reps  Standing squat  Standing: weightshift, reducing UE assist  SAQ x 15 , 3 lbs  Rt knee extension/flexion thigh held with towel Extension prop Bridging 2 x 10  Declined ice   [facility] Adult PT Treatment:                                                DATE: 04/23/24 Therapeutic Exercise: Seated LAQ Seated hamstring curl GTB x10  AAROM knee flexion x 5  Hamstring stretch x 30 sec Ball exercises:  HS curl, Bridges  SLR x 10, knee bent  SAQ x 20  Manual Therapy: Soft tissue work to [employer] knee, quad and peri-patella, scar tissue  Therapeutic Activity: Parallel bars:sit to stand  x 10 Standing heel raises with heavy upper body support x 15 High knee march x 10 to each side Self Care: Encourage consistent performance of home exercise p, dynamic strenuous rogram, seated right knee and ankle stretch using the strap Importance of stretching the right ankle into dorsiflexion in order to avoid contracture or persistent ankle stiffness    PATIENT EDUCATION:  Education details: use of ice vs heat.  Sleeping comfort/situation Person educated: Patient  Education method: Explanation, Demonstration, Tactile cues, Verbal cues, and Handouts Education comprehension: verbalized understanding, returned demonstration, verbal cues required, and tactile cues required  HOME EXERCISE PROGRAM: Access Code: [email or web address] URL: [email or web address] Date: 04/12/2024 Prepared by: Delon Norma  Exercises - Seated Long Arc Quad  - 1 x daily - 7 x weekly - 2 sets - 10 reps - 5 hold - Seated Knee Flexion AAROM  - 1 x daily - 7 x weekly - 2 sets - 10 reps - 5 hold - Supine Heel Slide  - 1 x daily - 7 x weekly - 2 sets - 10 reps - 5 hold - Supine Quad Set  - 1 x daily - 7 x weekly - 2 sets - 10 reps - 5 hold - Supine Gluteal Sets  - 1 x daily - 7 x weekly - 2 sets - 10 reps - 5 hold - Proper Sit to Stand Technique with PLB  - 1 x daily - 7 x weekly - 2 sets - 10 reps - 30 hold - Sit to Stand with Counter Support  - 1 x daily - 7 x weekly - 2 sets - 10 reps - 5 hold -Bridge, March, Clam   ASSESSMENT:   CLINICAL IMPRESSION: Pt continues to improve her ROM and ability to tolerate standing exercises.  She reports her LLE feel weak too. She is concerned tat there is more damage than just her ankle weakness.  I explained that she is deconditioned and the hips are the key to improving function.  Her knee  strength and confidence is improving in standing.  She performed x 10 sit to stands without UE from a standard hgt chair and 1 inch Airex pad under her hips. She will cont to benefit from skilled PT in order to progress to more independent level of mobility    Patients new medication has improved her pain.  She continues to improve her active range of motion in the right knee and swelling is also improved.  Patient is having transportation issues and may need to attend PT location closer to her home.  She does plan on coming Thursday to her next appointment.  Patient did not trust her right leg to hold her up and standing in order to work on strength.  Explained how her hip weakness plays a role in her ability to do this.  Her quad strength is good but certainly not where it needs to be.  She is unable to perform a straight leg raise on the right side.  She will continue to benefit from skilled physical therapy and will improve her gait once the AFO is received.   Patient is a 63 y.o. female who was seen today for physical therapy evaluation and treatment for Z96.651 (ICD-10-CM) - Status post total right knee replacement. Pt presents when min decreased AROM of the R knee for flexion and extension, decreased R LE strength, and walking with an antalgic gait pattern/slow pace. Following surgery, pt developed drop foot and uses a drop foot brace with ambulation. Pt will benefit from skilled PT 2w8 to address impairments to optimize R knee function for improved mobility..   OBJECTIVE IMPAIRMENTS: decreased activity tolerance, decreased balance, difficulty walking, decreased ROM, decreased strength, increased edema, obesity, and pain.   ACTIVITY LIMITATIONS: carrying, lifting, bending, sitting, standing, squatting, sleeping, stairs, transfers, bed mobility, bathing, locomotion level, and caring for others  PARTICIPATION LIMITATIONS: meal prep, cleaning, laundry, driving, shopping, and community  activity  PERSONAL FACTORS: Fitness, Past/current  experiences, Time since onset of injury/illness/exacerbation, and 3+ comorbidities: DDD- cervical and lumbar; L TKA 2018, High BMI are also affecting patient's functional outcome.   REHAB POTENTIAL: Good  CLINICAL DECISION MAKING: Evolving/moderate complexity  EVALUATION COMPLEXITY: Moderate   GOALS:  SHORT TERM GOALS: Target date: 04/23/24 Pt will be Ind in an initial HEP  Baseline:is inconsistent  Goal status: ongoing   2.  Increase R knee AROM to 7-100d Baseline: 15-80 (flexion met )  Goal status: ongoing   LONG TERM GOALS: Target date: 06/11/24  Pt will be Ind in a final HEP to maintain achieved LOF Baseline:  Goal status: INITIAL  2.  Increase R knee AROM  to 0-115d for functional mobility sitting and asc/dsc steps Baseline:  Goal status: INITIAL  3.  Improve 5xSTS by MCID of 5 and c RW by MCID of 1ft as indication of improved functional mobility  Baseline: TBA on 2nd viist Goal status: INITIAL  4.  Increase R knee strength to 4/5 and R hip to 3/5 for appropriate functional use with community ambulation Baseline:  Goal status: INITIAL  5.  Pt will be Ind with ambulation c a RW for 370' for community ambulation. With a LRAD as R drop foot improves. Baseline:  Goal status: INITIAL  6.  Pt will be able to asc/dsc 5 steps c assist c RW for community mobility Baseline:  Goal status: INITIAL   PLAN:  PT FREQUENCY: 2x/week  PT DURATION: 8 weeks  PLANNED INTERVENTIONS: 97164- PT Re-evaluation, 97110-Therapeutic exercises, 97530- Therapeutic activity, 97112- Neuromuscular re-education, 97535- Self Care, [id number]- Manual therapy, [id number]- Gait training, [phone]- Electrical stimulation (unattended), 97016- Vasopneumatic device, Patient/Family education, Balance training, Stair training, Taping, Dry Needling, Joint mobilization, Cryotherapy, and Moist heat  PLAN FOR NEXT SESSION: c check goals and continue to address  activity tolerance knee active range of motion .assess response to HEP; progress therex as indicated; use of modalities, manual therapy; and TPDN as indicated.   Delon Norma, PT 05/06/24 8:49 PM Phone: [phone] Fax: [phone]

## 2024-05-11 NOTE — Therapy (Incomplete)
 OUTPATIENT PHYSICAL THERAPY LOWER EXTREMITY NOTE   Patient Name: [name] MRN: [id number] DOB:26-Sep-1961, 63 y.o., female Today's Date: 05/11/2024  END OF SESSION:          Past Medical History:  Diagnosis Date   Anxiety    Asthma    Back pain    Chronic pain    DDD (degenerative disc disease), lumbar    Degenerative disc disease    Degenerative disc disease, cervical    Degenerative disc disease, cervical    Fibroid    Fibromyalgia    GERD (gastroesophageal reflux disease)    Headache    regular   Heartburn    Hypertension    Joint pain    Localized swelling of both lower legs    Osteoarthritis    Other specified disorders of thyroid     Pre-diabetes    Rheumatoid arthritis (HCC)    Sleep apnea    mild, patient does not use mask   SOB (shortness of breath)    Past Surgical History:  Procedure Laterality Date   ANTERIOR CERVICAL DECOMP/DISCECTOMY FUSION  1998   C4-7   NOVASURE ABLATION     TOTAL KNEE ARTHROPLASTY Left 01/17/2017   Procedure: LEFT TOTAL KNEE ARTHROPLASTY;  Surgeon: Arnie Lao, MD;  Location: [facility];  Service: Orthopedics;  Laterality: Left;   TOTAL KNEE ARTHROPLASTY Right 03/19/2024   Procedure: ARTHROPLASTY, KNEE, TOTAL;  Surgeon: Arnie Lao, MD;  Location: [facility];  Service: Orthopedics;  Laterality: Right;   TUBAL LIGATION     Patient Active Problem List   Diagnosis Date Noted   Status post total right knee replacement 03/19/2024   Visit for routine gyn exam 02/25/2022   Vitamin D  deficiency 02/21/2021   Essential hypertension 02/21/2021   Pure hypercholesterolemia 02/21/2021   Class 3 severe obesity with serious comorbidity and body mass index (BMI) of 50.0 to 59.9 in adult 06/13/2020   Prediabetes 06/13/2020   Leg pain 06/13/2020   Status post total left knee replacement 01/17/2017   Degenerative disc disease, cervical     PCP: Charle Congo, MD   REFERRING PROVIDER: Arnie Lao,  MD  REFERRING DIAG: [phone] (ICD-10-CM) - Status post total right knee replacement   THERAPY DIAG:  No diagnosis found.  Rationale for Evaluation and Treatment: Rehabilitation  ONSET DATE: 03/19/24 DOS  SUBJECTIVE:   SUBJECTIVE STATEMENT: The Lyrica  is really helping. Pain is 3/10.  Arrived a bit late.   PERTINENT HISTORY:  DDD- cervical and lumbar; L TKA 2018, High BMI  Recent MD visit 5/19: I did give her a prescription for an AFO at the [facility] clinic because the one from the hospital is not fitting well.  PAIN:  Are you having pain?Yes: NPRS scale: 3/10-4/10.  Pain range the week before start of PT: 5-8/10 Pain location: R knee and Rt foot  Pain description: ache Aggravating factors: walking Relieving factors: ice machine, pain medication. Brace helps the foot.   PRECAUTIONS: None  RED FLAGS: None   WEIGHT BEARING RESTRICTIONS: None; WBAT  FALLS:  Has patient fallen in last 6 months? No  LIVING ENVIRONMENT: Lives with: lives with their family Lives in: House/apartment Stairs: 0 steps Has following equipment at home: Single point cane, Environmental consultant - 2 wheeled, Environmental consultant - 4 wheeled, shower chair, bed side commode, and Grab bars  OCCUPATION: Not working  PLOF: Independent with household mobility with device  PATIENT GOALS: Good use of my R knee  NEXT MD VISIT: 04/21/24 Dr. Lucienne Ryder  OBJECTIVE:  Note: Objective measures were completed at Evaluation unless otherwise noted.  PATIENT SURVEYS:  LEFS 15/80=19%  COGNITION: Overall cognitive status: Within functional limits for tasks assessed     SENSATION: Light touch: Impaired R foot  EDEMA:   Swelling present  MUSCLE LENGTH: Hamstrings: Right NT deg; Left NT deg Andy Bannister test: Right NT deg; Left NT deg  POSTURE: increased lumbar lordosis and flexed trunk   PALPATION: TTP R lateral knee  LOWER EXTREMITY ROM:  Active ROM Right eval Rt  04/12/24 Rt 04/16/24 Rt 04/23/24  Hip flexion      Hip extension       Hip abduction      Hip adduction      Hip internal rotation      Hip external rotation      Knee flexion 80 92 deg AAROM  97d AAROM 104 deg AAROM  Knee extension 15 lacking   Lacking 6  Ankle dorsiflexion      Ankle plantarflexion      Ankle inversion      Ankle eversion       (Blank rows = not tested)  LOWER EXTREMITY MMT:  MMT Right eval Rt 05/06/24 Lt.  05/06/24  Hip flexion 2+ 3- 3  Hip extension 2+    Hip abduction     Hip adduction 2+    Hip internal rotation     Hip external rotation 2+    Knee flexion 3 4+ 4+  Knee extension 3 3+ 4+  Ankle dorsiflexion Drop foot    Ankle plantarflexion     Ankle inversion     Ankle eversion      (Blank rows = not tested)  LOWER EXTREMITY SPECIAL TESTS:  NT  FUNCTIONAL TESTS:  04/08/24: 5 times sit to stand: 24.76 sec used hands to pull up to walker  2 minute walk test: 81 feet, with RW   On her 2nd visit GAIT: Distance walked: 100' Assistive device utilized: Environmental consultant - 2 wheeled Level of assistance: Modified independence Comments: Antalgic gait pattern over R LE, slow pace, R drop foot, wears drop foot brace                                                                                                                                TREATMENT DATE:  [facility] Adult PT Treatment:                                                DATE: 05/12/24 Therapeutic Exercise: *** Manual Therapy: *** Neuromuscular re-ed: *** Therapeutic Activity: *** Modalities: *** Self Care: Renaldo Caroli Adult PT Treatment:  DATE: 05/06/24 Therapeutic Activity: Standing exercises for activity tolerance: step ups , step taps Squats x 10  Sit to stand x 10 no UEs to countertop Sit to stand to march x 10 with countertop  Supine knee ROM flexion to 105 PROM and ext lacking 8 Quad set  x 10  SLR unable  March  x 10 with and without band  Clam x blue band  Bridge x 10  SL clam blue band x 10 manual  assist to maintain level hips    [facility] Adult PT Treatment:                                                DATE: 05/04/24 Therapeutic Activity: NuStep LE and UE for 6 min  LAQ 4 lbs wgt EOB  SAQ x 15 , 3 lbs  Bridging 2 x 10  Rt knee extension stretching with strap 30 sec x 3 , ankle propped on bolster AAROM knee flexion (104 deg) March Rt LE  Standing hip abd, extension x 15 Rt LE  High knee march  Weight shifting on Airex, narrow, min UE support , needed seated rest breaks     PATIENT EDUCATION:  Education details: use of ice vs heat.  Sleeping comfort/situation Person educated: Patient Education method: Explanation, Demonstration, Tactile cues, Verbal cues, and Handouts Education comprehension: verbalized understanding, returned demonstration, verbal cues required, and tactile cues required  HOME EXERCISE PROGRAM: Access Code: [email or web address] URL: [email or web address] Date: 04/12/2024 Prepared by: Marci Setter  Exercises - Seated Long Arc Quad  - 1 x daily - 7 x weekly - 2 sets - 10 reps - 5 hold - Seated Knee Flexion AAROM  - 1 x daily - 7 x weekly - 2 sets - 10 reps - 5 hold - Supine Heel Slide  - 1 x daily - 7 x weekly - 2 sets - 10 reps - 5 hold - Supine Quad Set  - 1 x daily - 7 x weekly - 2 sets - 10 reps - 5 hold - Supine Gluteal Sets  - 1 x daily - 7 x weekly - 2 sets - 10 reps - 5 hold - Proper Sit to Stand Technique with PLB  - 1 x daily - 7 x weekly - 2 sets - 10 reps - 30 hold - Sit to Stand with Counter Support  - 1 x daily - 7 x weekly - 2 sets - 10 reps - 5 hold -Bridge, March, Clam   ASSESSMENT:   CLINICAL IMPRESSION: Pt continues to improve her ROM and ability to tolerate standing exercises.  She reports her LLE feel weak too. She is concerned tat there is more damage than just her ankle weakness.  I explained that she is deconditioned and the hips are the key to improving function.  Her knee strength and confidence is improving in standing.  She  performed x 10 sit to stands without UE from a standard hgt chair and 1 inch Airex pad under her hips. She will cont to benefit from skilled PT in order to progress to more independent level of mobility    Patients new medication has improved her pain.  She continues to improve her active range of motion in the right knee and swelling is also improved.  Patient is having transportation issues and may need to attend PT location closer to  her home.  She does plan on coming Thursday to her next appointment.  Patient did not trust her right leg to hold her up and standing in order to work on strength.  Explained how her hip weakness plays a role in her ability to do this.  Her quad strength is good but certainly not where it needs to be.  She is unable to perform a straight leg raise on the right side.  She will continue to benefit from skilled physical therapy and will improve her gait once the AFO is received.   Patient is a 63 y.o. female who was seen today for physical therapy evaluation and treatment for Z96.651 (ICD-10-CM) - Status post total right knee replacement. Pt presents when min decreased AROM of the R knee for flexion and extension, decreased R LE strength, and walking with an antalgic gait pattern/slow pace. Following surgery, pt developed drop foot and uses a drop foot brace with ambulation. Pt will benefit from skilled PT 2w8 to address impairments to optimize R knee function for improved mobility..   OBJECTIVE IMPAIRMENTS: decreased activity tolerance, decreased balance, difficulty walking, decreased ROM, decreased strength, increased edema, obesity, and pain.   ACTIVITY LIMITATIONS: carrying, lifting, bending, sitting, standing, squatting, sleeping, stairs, transfers, bed mobility, bathing, locomotion level, and caring for others  PARTICIPATION LIMITATIONS: meal prep, cleaning, laundry, driving, shopping, and community activity  PERSONAL FACTORS: Fitness, Past/current experiences, Time  since onset of injury/illness/exacerbation, and 3+ comorbidities: DDD- cervical and lumbar; L TKA 2018, High BMI are also affecting patient's functional outcome.   REHAB POTENTIAL: Good  CLINICAL DECISION MAKING: Evolving/moderate complexity  EVALUATION COMPLEXITY: Moderate   GOALS:  SHORT TERM GOALS: Target date: 04/23/24 Pt will be Ind in an initial HEP  Baseline:is inconsistent  Goal status: ongoing   2.  Increase R knee AROM to 7-100d Baseline: 15-80 (flexion met )  Goal status: ongoing   LONG TERM GOALS: Target date: 06/11/24  Pt will be Ind in a final HEP to maintain achieved LOF Baseline:  Goal status: INITIAL  2.  Increase R knee AROM  to 0-115d for functional mobility sitting and asc/dsc steps Baseline:  Goal status: INITIAL  3.  Improve 5xSTS by MCID of 5" and c RW by MCID of 33ft as indication of improved functional mobility  Baseline: TBA on 2nd viist Goal status: INITIAL  4.  Increase R knee strength to 4/5 and R hip to 3/5 for appropriate functional use with community ambulation Baseline:  Goal status: INITIAL  5.  Pt will be Ind with ambulation c a RW for 370' for community ambulation. With a LRAD as R drop foot improves. Baseline:  Goal status: INITIAL  6.  Pt will be able to asc/dsc 5 steps c assist c RW for community mobility Baseline:  Goal status: INITIAL   PLAN:  PT FREQUENCY: 2x/week  PT DURATION: 8 weeks  PLANNED INTERVENTIONS: 97164- PT Re-evaluation, 97110-Therapeutic exercises, 97530- Therapeutic activity, 97112- Neuromuscular re-education, 97535- Self Care, 91478- Manual therapy, [id number]- Gait training, [phone]- Electrical stimulation (unattended), 97016- Vasopneumatic device, Patient/Family education, Balance training, Stair training, Taping, Dry Needling, Joint mobilization, Cryotherapy, and Moist heat  PLAN FOR NEXT SESSION: c check goals and continue to address activity tolerance knee active range of motion .assess response to HEP;  progress therex as indicated; use of modalities, manual therapy; and TPDN as indicated.   Laparis Durrett MS, PT 05/11/24 9:26 AM

## 2024-05-12 ENCOUNTER — Ambulatory Visit: Admitting: Physical Therapy

## 2024-05-12 ENCOUNTER — Ambulatory Visit

## 2024-05-13 NOTE — Therapy (Incomplete)
 OUTPATIENT PHYSICAL THERAPY LOWER EXTREMITY NOTE   Patient Name: [name] MRN: [id number] DOB:1961-03-02, 63 y.o., female Today's Date: 05/13/2024  END OF SESSION:          Past Medical History:  Diagnosis Date   Anxiety    Asthma    Back pain    Chronic pain    DDD (degenerative disc disease), lumbar    Degenerative disc disease    Degenerative disc disease, cervical    Degenerative disc disease, cervical    Fibroid    Fibromyalgia    GERD (gastroesophageal reflux disease)    Headache    regular   Heartburn    Hypertension    Joint pain    Localized swelling of both lower legs    Osteoarthritis    Other specified disorders of thyroid     Pre-diabetes    Rheumatoid arthritis (HCC)    Sleep apnea    mild, patient does not use mask   SOB (shortness of breath)    Past Surgical History:  Procedure Laterality Date   ANTERIOR CERVICAL DECOMP/DISCECTOMY FUSION  1998   C4-7   NOVASURE ABLATION     TOTAL KNEE ARTHROPLASTY Left 01/17/2017   Procedure: LEFT TOTAL KNEE ARTHROPLASTY;  Surgeon: Arnie Lao, MD;  Location: [facility];  Service: Orthopedics;  Laterality: Left;   TOTAL KNEE ARTHROPLASTY Right 03/19/2024   Procedure: ARTHROPLASTY, KNEE, TOTAL;  Surgeon: Arnie Lao, MD;  Location: [facility];  Service: Orthopedics;  Laterality: Right;   TUBAL LIGATION     Patient Active Problem List   Diagnosis Date Noted   Status post total right knee replacement 03/19/2024   Visit for routine gyn exam 02/25/2022   Vitamin D  deficiency 02/21/2021   Essential hypertension 02/21/2021   Pure hypercholesterolemia 02/21/2021   Class 3 severe obesity with serious comorbidity and body mass index (BMI) of 50.0 to 59.9 in adult 06/13/2020   Prediabetes 06/13/2020   Leg pain 06/13/2020   Status post total left knee replacement 01/17/2017   Degenerative disc disease, cervical     PCP: Charle Congo, MD   REFERRING PROVIDER: Arnie Lao,  MD  REFERRING DIAG: [phone] (ICD-10-CM) - Status post total right knee replacement   THERAPY DIAG:  No diagnosis found.  Rationale for Evaluation and Treatment: Rehabilitation  ONSET DATE: 03/19/24 DOS  SUBJECTIVE:   SUBJECTIVE STATEMENT: The Lyrica  is really helping. Pain is 3/10.  Arrived a bit late.   PERTINENT HISTORY:  DDD- cervical and lumbar; L TKA 2018, High BMI  Recent MD visit 5/19: I did give her a prescription for an AFO at the [facility] clinic because the one from the hospital is not fitting well.  PAIN:  Are you having pain?Yes: NPRS scale: 3/10-4/10.  Pain range the week before start of PT: 5-8/10 Pain location: R knee and Rt foot  Pain description: ache Aggravating factors: walking Relieving factors: ice machine, pain medication. Brace helps the foot.   PRECAUTIONS: None  RED FLAGS: None   WEIGHT BEARING RESTRICTIONS: None; WBAT  FALLS:  Has patient fallen in last 6 months? No  LIVING ENVIRONMENT: Lives with: lives with their family Lives in: House/apartment Stairs: 0 steps Has following equipment at home: Single point cane, Environmental consultant - 2 wheeled, Environmental consultant - 4 wheeled, shower chair, bed side commode, and Grab bars  OCCUPATION: Not working  PLOF: Independent with household mobility with device  PATIENT GOALS: Good use of my R knee  NEXT MD VISIT: 04/21/24 Dr. Lucienne Ryder  OBJECTIVE:  Note: Objective measures were completed at Evaluation unless otherwise noted.  PATIENT SURVEYS:  LEFS 15/80=19%  COGNITION: Overall cognitive status: Within functional limits for tasks assessed     SENSATION: Light touch: Impaired R foot  EDEMA:   Swelling present  MUSCLE LENGTH: Hamstrings: Right NT deg; Left NT deg Andy Bannister test: Right NT deg; Left NT deg  POSTURE: increased lumbar lordosis and flexed trunk   PALPATION: TTP R lateral knee  LOWER EXTREMITY ROM:  Active ROM Right eval Rt  04/12/24 Rt 04/16/24 Rt 04/23/24  Hip flexion      Hip extension       Hip abduction      Hip adduction      Hip internal rotation      Hip external rotation      Knee flexion 80 92 deg AAROM  97d AAROM 104 deg AAROM  Knee extension 15 lacking   Lacking 6  Ankle dorsiflexion      Ankle plantarflexion      Ankle inversion      Ankle eversion       (Blank rows = not tested)  LOWER EXTREMITY MMT:  MMT Right eval Rt 05/06/24 Lt.  05/06/24  Hip flexion 2+ 3- 3  Hip extension 2+    Hip abduction     Hip adduction 2+    Hip internal rotation     Hip external rotation 2+    Knee flexion 3 4+ 4+  Knee extension 3 3+ 4+  Ankle dorsiflexion Drop foot    Ankle plantarflexion     Ankle inversion     Ankle eversion      (Blank rows = not tested)  LOWER EXTREMITY SPECIAL TESTS:  NT  FUNCTIONAL TESTS:  04/08/24: 5 times sit to stand: 24.76 sec used hands to pull up to walker  2 minute walk test: 81 feet, with RW   On her 2nd visit GAIT: Distance walked: 100' Assistive device utilized: Environmental consultant - 2 wheeled Level of assistance: Modified independence Comments: Antalgic gait pattern over R LE, slow pace, R drop foot, wears drop foot brace                                                                                                                                TREATMENT DATE:  [facility] Adult PT Treatment:                                                DATE: 05/14/24  Therapeutic Exercise: *** Manual Therapy: *** Neuromuscular re-ed: *** Therapeutic Activity: *** Modalities: *** Self Care: Renaldo Caroli Adult PT Treatment:  DATE: 05/06/24 Therapeutic Activity: Standing exercises for activity tolerance: step ups , step taps Squats x 10  Sit to stand x 10 no UEs to countertop Sit to stand to march x 10 with countertop  Supine knee ROM flexion to 105 PROM and ext lacking 8 Quad set  x 10  SLR unable  March  x 10 with and without band  Clam x blue band  Bridge x 10  SL clam blue band x 10 manual  assist to maintain level hips    [facility] Adult PT Treatment:                                                DATE: 05/04/24 Therapeutic Activity: NuStep LE and UE for 6 min  LAQ 4 lbs wgt EOB  SAQ x 15 , 3 lbs  Bridging 2 x 10  Rt knee extension stretching with strap 30 sec x 3 , ankle propped on bolster AAROM knee flexion (104 deg) March Rt LE  Standing hip abd, extension x 15 Rt LE  High knee march  Weight shifting on Airex, narrow, min UE support , needed seated rest breaks     PATIENT EDUCATION:  Education details: use of ice vs heat.  Sleeping comfort/situation Person educated: Patient Education method: Explanation, Demonstration, Tactile cues, Verbal cues, and Handouts Education comprehension: verbalized understanding, returned demonstration, verbal cues required, and tactile cues required  HOME EXERCISE PROGRAM: Access Code: [email or web address] URL: [email or web address] Date: 04/12/2024 Prepared by: Marci Setter  Exercises - Seated Long Arc Quad  - 1 x daily - 7 x weekly - 2 sets - 10 reps - 5 hold - Seated Knee Flexion AAROM  - 1 x daily - 7 x weekly - 2 sets - 10 reps - 5 hold - Supine Heel Slide  - 1 x daily - 7 x weekly - 2 sets - 10 reps - 5 hold - Supine Quad Set  - 1 x daily - 7 x weekly - 2 sets - 10 reps - 5 hold - Supine Gluteal Sets  - 1 x daily - 7 x weekly - 2 sets - 10 reps - 5 hold - Proper Sit to Stand Technique with PLB  - 1 x daily - 7 x weekly - 2 sets - 10 reps - 30 hold - Sit to Stand with Counter Support  - 1 x daily - 7 x weekly - 2 sets - 10 reps - 5 hold -Bridge, March, Clam   ASSESSMENT:   CLINICAL IMPRESSION: Pt continues to improve her ROM and ability to tolerate standing exercises.  She reports her LLE feel weak too. She is concerned tat there is more damage than just her ankle weakness.  I explained that she is deconditioned and the hips are the key to improving function.  Her knee strength and confidence is improving in standing.  She  performed x 10 sit to stands without UE from a standard hgt chair and 1 inch Airex pad under her hips. She will cont to benefit from skilled PT in order to progress to more independent level of mobility    Patients new medication has improved her pain.  She continues to improve her active range of motion in the right knee and swelling is also improved.  Patient is having transportation issues and may need to attend PT location closer to  her home.  She does plan on coming Thursday to her next appointment.  Patient did not trust her right leg to hold her up and standing in order to work on strength.  Explained how her hip weakness plays a role in her ability to do this.  Her quad strength is good but certainly not where it needs to be.  She is unable to perform a straight leg raise on the right side.  She will continue to benefit from skilled physical therapy and will improve her gait once the AFO is received.   Patient is a 63 y.o. female who was seen today for physical therapy evaluation and treatment for Z96.651 (ICD-10-CM) - Status post total right knee replacement. Pt presents when min decreased AROM of the R knee for flexion and extension, decreased R LE strength, and walking with an antalgic gait pattern/slow pace. Following surgery, pt developed drop foot and uses a drop foot brace with ambulation. Pt will benefit from skilled PT 2w8 to address impairments to optimize R knee function for improved mobility..   OBJECTIVE IMPAIRMENTS: decreased activity tolerance, decreased balance, difficulty walking, decreased ROM, decreased strength, increased edema, obesity, and pain.   ACTIVITY LIMITATIONS: carrying, lifting, bending, sitting, standing, squatting, sleeping, stairs, transfers, bed mobility, bathing, locomotion level, and caring for others  PARTICIPATION LIMITATIONS: meal prep, cleaning, laundry, driving, shopping, and community activity  PERSONAL FACTORS: Fitness, Past/current experiences, Time  since onset of injury/illness/exacerbation, and 3+ comorbidities: DDD- cervical and lumbar; L TKA 2018, High BMI are also affecting patient's functional outcome.   REHAB POTENTIAL: Good  CLINICAL DECISION MAKING: Evolving/moderate complexity  EVALUATION COMPLEXITY: Moderate   GOALS:  SHORT TERM GOALS: Target date: 04/23/24 Pt will be Ind in an initial HEP  Baseline:is inconsistent  Goal status: ongoing   2.  Increase R knee AROM to 7-100d Baseline: 15-80 (flexion met )  Goal status: ongoing   LONG TERM GOALS: Target date: 06/11/24  Pt will be Ind in a final HEP to maintain achieved LOF Baseline:  Goal status: INITIAL  2.  Increase R knee AROM  to 0-115d for functional mobility sitting and asc/dsc steps Baseline:  Goal status: INITIAL  3.  Improve 5xSTS by MCID of 5" and c RW by MCID of 74ft as indication of improved functional mobility  Baseline: TBA on 2nd viist Goal status: INITIAL  4.  Increase R knee strength to 4/5 and R hip to 3/5 for appropriate functional use with community ambulation Baseline:  Goal status: INITIAL  5.  Pt will be Ind with ambulation c a RW for 370' for community ambulation. With a LRAD as R drop foot improves. Baseline:  Goal status: INITIAL  6.  Pt will be able to asc/dsc 5 steps c assist c RW for community mobility Baseline:  Goal status: INITIAL   PLAN:  PT FREQUENCY: 2x/week  PT DURATION: 8 weeks  PLANNED INTERVENTIONS: 97164- PT Re-evaluation, 97110-Therapeutic exercises, 97530- Therapeutic activity, 97112- Neuromuscular re-education, 97535- Self Care, 82956- Manual therapy, [id number]- Gait training, [phone]- Electrical stimulation (unattended), 97016- Vasopneumatic device, Patient/Family education, Balance training, Stair training, Taping, Dry Needling, Joint mobilization, Cryotherapy, and Moist heat  PLAN FOR NEXT SESSION: c check goals and continue to address activity tolerance knee active range of motion .assess response to HEP;  progress therex as indicated; use of modalities, manual therapy; and TPDN as indicated.   Kalvyn Desa MS, PT 05/13/24 6:08 PM

## 2024-05-14 ENCOUNTER — Ambulatory Visit

## 2024-05-14 ENCOUNTER — Ambulatory Visit: Admitting: Physical Therapy

## 2024-05-17 ENCOUNTER — Telehealth: Payer: Self-pay | Admitting: Orthopaedic Surgery

## 2024-05-17 NOTE — Telephone Encounter (Signed)
 Patient called. Says nothing is helping with pain. Also she is swollen. Can hardly get around. Would like to be seen this week. Her cb# [phone]

## 2024-05-17 NOTE — Telephone Encounter (Signed)
 Called patient scheduled for tomorrow afternoon for eval .

## 2024-05-18 ENCOUNTER — Ambulatory Visit: Admitting: Physical Therapy

## 2024-05-18 ENCOUNTER — Ambulatory Visit (HOSPITAL_COMMUNITY)
Admission: RE | Admit: 2024-05-18 | Discharge: 2024-05-18 | Disposition: A | Discharge status: Home / Self Care | Source: Ambulatory Visit | Attending: Physician Assistant | Admitting: Physician Assistant

## 2024-05-18 ENCOUNTER — Ambulatory Visit (INDEPENDENT_AMBULATORY_CARE_PROVIDER_SITE_OTHER): Admitting: Physician Assistant

## 2024-05-18 ENCOUNTER — Encounter: Payer: Self-pay | Admitting: Physician Assistant

## 2024-05-18 ENCOUNTER — Telehealth: Payer: Self-pay

## 2024-05-18 ENCOUNTER — Ambulatory Visit: Payer: Self-pay

## 2024-05-18 DIAGNOSIS — Z96651 Presence of right artificial knee joint: Secondary | ICD-10-CM

## 2024-05-18 NOTE — Progress Notes (Signed)
 HPI: [name] returns today status post right total knee arthroplasty 03/19/2024.  She continues to have pain and swelling in the knee.  She also has a postop foot drop on the right.  Her AFO was ordered and should be in soon.  She feels her therapy is not really helping.  She has 2 more visits.  She would like to make switch to a different therapist.  She notes she is having significant swelling in her foot and calf.  She is having calf pain.  She did stop her Lyrica  due to the fact that she felt it made her leg weak.  Review of systems: Negative for fevers or chills.  Physical exam: General Well-developed well-nourished pleasant female no acute distress. Right knee: Full extension flexion to approximately 110 degrees.  No gross instability.  Surgical incisions well-healed.  Right calf supple tenderness throughout the calf.  No pitting edema but swelling throughout the right lower leg compared to the left.  Foot drop still present.  Right knee 2 views: Status post right total knee arthroplasty without any hardware failure.  No subluxation dislocation.  No acute fractures or acute findings. Impression: Status post right total knee arthroplasty Postop right foot drop  Plan: Will obtain Doppler of her right lower leg due to swelling and calf pain to rule out DVT.  She is given a prescription for resolved physical therapy to evaluate and treat her for right total knee arthroplasty I work on strengthening range of motion home exercise.  In regards to the dropfoot they will evaluate and treat this will include range of motion home exercise modalities.  She will begin wearing her AFO once available.  Doppler of the right lower leg was obtained today and showed no evidence of DVT.  Patient was called with results.

## 2024-05-18 NOTE — Telephone Encounter (Signed)
 [name] wanted to let [name] know that patient is Negative for DVT, right LE.  Please advise.  Thank you.

## 2024-05-18 NOTE — Telephone Encounter (Signed)
 GIL CALLED PATIENT.

## 2024-05-19 NOTE — Therapy (Incomplete)
 OUTPATIENT PHYSICAL THERAPY LOWER EXTREMITY NOTE   Patient Name: [name] MRN: [id number] DOB:[birth year], 63 y.o., female Today's Date: 05/19/2024  END OF SESSION:          Past Medical History:  Diagnosis Date   Anxiety    Asthma    Back pain    Chronic pain    DDD (degenerative disc disease), lumbar    Degenerative disc disease    Degenerative disc disease, cervical    Degenerative disc disease, cervical    Fibroid    Fibromyalgia    GERD (gastroesophageal reflux disease)    Headache    regular   Heartburn    Hypertension    Joint pain    Localized swelling of both lower legs    Osteoarthritis    Other specified disorders of thyroid     Pre-diabetes    Rheumatoid arthritis (HCC)    Sleep apnea    mild, patient does not use mask   SOB (shortness of breath)    Past Surgical History:  Procedure Laterality Date   ANTERIOR CERVICAL DECOMP/DISCECTOMY FUSION  1998   C4-7   NOVASURE ABLATION     TOTAL KNEE ARTHROPLASTY Left 01/17/2017   Procedure: LEFT TOTAL KNEE ARTHROPLASTY;  Surgeon: Arnie Lao, MD;  Location: [facility];  Service: Orthopedics;  Laterality: Left;   TOTAL KNEE ARTHROPLASTY Right 03/19/2024   Procedure: ARTHROPLASTY, KNEE, TOTAL;  Surgeon: Arnie Lao, MD;  Location: [facility];  Service: Orthopedics;  Laterality: Right;   TUBAL LIGATION     Patient Active Problem List   Diagnosis Date Noted   Status post total right knee replacement 03/19/2024   Visit for routine gyn exam 02/25/2022   Vitamin D  deficiency 02/21/2021   Essential hypertension 02/21/2021   Pure hypercholesterolemia 02/21/2021   Class 3 severe obesity with serious comorbidity and body mass index (BMI) of 50.0 to 59.9 in adult 06/13/2020   Prediabetes 06/13/2020   Leg pain 06/13/2020   Status post total left knee replacement 01/17/2017   Degenerative disc disease, cervical     PCP: Charle Congo, MD   REFERRING PROVIDER: Arnie Lao,  MD  REFERRING DIAG: [phone] (ICD-10-CM) - Status post total right knee replacement   THERAPY DIAG:  No diagnosis found.  Rationale for Evaluation and Treatment: Rehabilitation  ONSET DATE: 03/19/24 DOS  SUBJECTIVE:   SUBJECTIVE STATEMENT: The Lyrica  is really helping. Pain is 3/10.  Arrived a bit late.   PERTINENT HISTORY:  DDD- cervical and lumbar; L TKA 2018, High BMI  Recent MD visit 5/19: I did give her a prescription for an AFO at the [facility] clinic because the one from the hospital is not fitting well.  PAIN:  Are you having pain?Yes: NPRS scale: 3/10-4/10.  Pain range the week before start of PT: 5-8/10 Pain location: R knee and Rt foot  Pain description: ache Aggravating factors: walking Relieving factors: ice machine, pain medication. Brace helps the foot.   PRECAUTIONS: None  RED FLAGS: None   WEIGHT BEARING RESTRICTIONS: None; WBAT  FALLS:  Has patient fallen in last 6 months? No  LIVING ENVIRONMENT: Lives with: lives with their family Lives in: House/apartment Stairs: 0 steps Has following equipment at home: Single point cane, Environmental consultant - 2 wheeled, Environmental consultant - 4 wheeled, shower chair, bed side commode, and Grab bars  OCCUPATION: Not working  PLOF: Independent with household mobility with device  PATIENT GOALS: Good use of my R knee  NEXT MD VISIT: 04/21/24 Dr. Lucienne Ryder  OBJECTIVE:  Note: Objective measures were completed at Evaluation unless otherwise noted.  PATIENT SURVEYS:  LEFS 15/80=19%  COGNITION: Overall cognitive status: Within functional limits for tasks assessed     SENSATION: Light touch: Impaired R foot  EDEMA:   Swelling present  MUSCLE LENGTH: Hamstrings: Right NT deg; Left NT deg Andy Bannister test: Right NT deg; Left NT deg  POSTURE: increased lumbar lordosis and flexed trunk   PALPATION: TTP R lateral knee  LOWER EXTREMITY ROM:  Active ROM Right eval Rt  04/12/24 Rt 04/16/24 Rt 04/23/24  Hip flexion      Hip extension       Hip abduction      Hip adduction      Hip internal rotation      Hip external rotation      Knee flexion 80 92 deg AAROM  97d AAROM 104 deg AAROM  Knee extension 15 lacking   Lacking 6  Ankle dorsiflexion      Ankle plantarflexion      Ankle inversion      Ankle eversion       (Blank rows = not tested)  LOWER EXTREMITY MMT:  MMT Right eval Rt 05/06/24 Lt.  05/06/24  Hip flexion 2+ 3- 3  Hip extension 2+    Hip abduction     Hip adduction 2+    Hip internal rotation     Hip external rotation 2+    Knee flexion 3 4+ 4+  Knee extension 3 3+ 4+  Ankle dorsiflexion Drop foot    Ankle plantarflexion     Ankle inversion     Ankle eversion      (Blank rows = not tested)  LOWER EXTREMITY SPECIAL TESTS:  NT  FUNCTIONAL TESTS:  04/08/24: 5 times sit to stand: 24.76 sec used hands to pull up to walker  2 minute walk test: 81 feet, with RW   On her 2nd visit GAIT: Distance walked: 100' Assistive device utilized: Environmental consultant - 2 wheeled Level of assistance: Modified independence Comments: Antalgic gait pattern over R LE, slow pace, R drop foot, wears drop foot brace                                                                                                                                TREATMENT DATE:  [facility] Adult PT Treatment:                                                DATE:05/20/24  Therapeutic Exercise: *** Manual Therapy: *** Neuromuscular re-ed: *** Therapeutic Activity: *** Modalities: *** Self Care: ***  [facility] Adult [facility] Treatment:  DATE: 05/06/24 Therapeutic Activity: Standing exercises for activity tolerance: step ups , step taps Squats x 10  Sit to stand x 10 no UEs to countertop Sit to stand to march x 10 with countertop  Supine knee ROM flexion to 105 PROM and ext lacking 8 Quad set  x 10  SLR unable  March  x 10 with and without band  Clam x blue band  Bridge x 10  SL clam blue band x 10 manual  assist to maintain level hips    [facility] Adult PT Treatment:                                                DATE: 05/04/24 Therapeutic Activity: NuStep LE and UE for 6 min  LAQ 4 lbs wgt EOB  SAQ x 15 , 3 lbs  Bridging 2 x 10  Rt knee extension stretching with strap 30 sec x 3 , ankle propped on bolster AAROM knee flexion (104 deg) March Rt LE  Standing hip abd, extension x 15 Rt LE  High knee march  Weight shifting on Airex, narrow, min UE support , needed seated rest breaks     PATIENT EDUCATION:  Education details: use of ice vs heat.  Sleeping comfort/situation Person educated: Patient Education method: Explanation, Demonstration, Tactile cues, Verbal cues, and Handouts Education comprehension: verbalized understanding, returned demonstration, verbal cues required, and tactile cues required  HOME EXERCISE PROGRAM: Access Code: [email or web address] URL: [email or web address] Date: 04/12/2024 Prepared by: Marci Setter  Exercises - Seated Long Arc Quad  - 1 x daily - 7 x weekly - 2 sets - 10 reps - 5 hold - Seated Knee Flexion AAROM  - 1 x daily - 7 x weekly - 2 sets - 10 reps - 5 hold - Supine Heel Slide  - 1 x daily - 7 x weekly - 2 sets - 10 reps - 5 hold - Supine Quad Set  - 1 x daily - 7 x weekly - 2 sets - 10 reps - 5 hold - Supine Gluteal Sets  - 1 x daily - 7 x weekly - 2 sets - 10 reps - 5 hold - Proper Sit to Stand Technique with PLB  - 1 x daily - 7 x weekly - 2 sets - 10 reps - 30 hold - Sit to Stand with Counter Support  - 1 x daily - 7 x weekly - 2 sets - 10 reps - 5 hold -Bridge, March, Clam   ASSESSMENT:   CLINICAL IMPRESSION: Pt continues to improve her ROM and ability to tolerate standing exercises.  She reports her LLE feel weak too. She is concerned tat there is more damage than just her ankle weakness.  I explained that she is deconditioned and the hips are the key to improving function.  Her knee strength and confidence is improving in standing.  She  performed x 10 sit to stands without UE from a standard hgt chair and 1 inch Airex pad under her hips. She will cont to benefit from skilled PT in order to progress to more independent level of mobility    Patients new medication has improved her pain.  She continues to improve her active range of motion in the right knee and swelling is also improved.  Patient is having transportation issues and may need to attend PT location closer to  her home.  She does plan on coming Thursday to her next appointment.  Patient did not trust her right leg to hold her up and standing in order to work on strength.  Explained how her hip weakness plays a role in her ability to do this.  Her quad strength is good but certainly not where it needs to be.  She is unable to perform a straight leg raise on the right side.  She will continue to benefit from skilled physical therapy and will improve her gait once the AFO is received.   Patient is a 63 y.o. female who was seen today for physical therapy evaluation and treatment for Z96.651 (ICD-10-CM) - Status post total right knee replacement. Pt presents when min decreased AROM of the R knee for flexion and extension, decreased R LE strength, and walking with an antalgic gait pattern/slow pace. Following surgery, pt developed drop foot and uses a drop foot brace with ambulation. Pt will benefit from skilled PT 2w8 to address impairments to optimize R knee function for improved mobility..   OBJECTIVE IMPAIRMENTS: decreased activity tolerance, decreased balance, difficulty walking, decreased ROM, decreased strength, increased edema, obesity, and pain.   ACTIVITY LIMITATIONS: carrying, lifting, bending, sitting, standing, squatting, sleeping, stairs, transfers, bed mobility, bathing, locomotion level, and caring for others  PARTICIPATION LIMITATIONS: meal prep, cleaning, laundry, driving, shopping, and community activity  PERSONAL FACTORS: Fitness, Past/current experiences, Time  since onset of injury/illness/exacerbation, and 3+ comorbidities: DDD- cervical and lumbar; L TKA 2018, High BMI are also affecting patient's functional outcome.   REHAB POTENTIAL: Good  CLINICAL DECISION MAKING: Evolving/moderate complexity  EVALUATION COMPLEXITY: Moderate   GOALS:  SHORT TERM GOALS: Target date: 04/23/24 Pt will be Ind in an initial HEP  Baseline:is inconsistent  Goal status: ongoing   2.  Increase R knee AROM to 7-100d Baseline: 15-80 (flexion met )  Goal status: ongoing   LONG TERM GOALS: Target date: 06/11/24  Pt will be Ind in a final HEP to maintain achieved LOF Baseline:  Goal status: INITIAL  2.  Increase R knee AROM  to 0-115d for functional mobility sitting and asc/dsc steps Baseline:  Goal status: INITIAL  3.  Improve 5xSTS by MCID of 5" and c RW by MCID of 65ft as indication of improved functional mobility  Baseline: TBA on 2nd viist Goal status: INITIAL  4.  Increase R knee strength to 4/5 and R hip to 3/5 for appropriate functional use with community ambulation Baseline:  Goal status: INITIAL  5.  Pt will be Ind with ambulation c a RW for 370' for community ambulation. With a LRAD as R drop foot improves. Baseline:  Goal status: INITIAL  6.  Pt will be able to asc/dsc 5 steps c assist c RW for community mobility Baseline:  Goal status: INITIAL   PLAN:  PT FREQUENCY: 2x/week  PT DURATION: 8 weeks  PLANNED INTERVENTIONS: 97164- PT Re-evaluation, 97110-Therapeutic exercises, 97530- Therapeutic activity, 97112- Neuromuscular re-education, 97535- Self Care, 09811- Manual therapy, [id number]- Gait training, [phone]- Electrical stimulation (unattended), 97016- Vasopneumatic device, Patient/Family education, Balance training, Stair training, Taping, Dry Needling, Joint mobilization, Cryotherapy, and Moist heat  PLAN FOR NEXT SESSION: c check goals and continue to address activity tolerance knee active range of motion .assess response to HEP;  progress therex as indicated; use of modalities, manual therapy; and TPDN as indicated.   Cami Delawder MS, PT 05/19/24 9:57 PM

## 2024-05-20 ENCOUNTER — Ambulatory Visit: Attending: Orthopaedic Surgery

## 2024-06-02 ENCOUNTER — Ambulatory Visit (INDEPENDENT_AMBULATORY_CARE_PROVIDER_SITE_OTHER): Admitting: Orthopaedic Surgery

## 2024-06-02 ENCOUNTER — Encounter: Payer: Self-pay | Admitting: Orthopaedic Surgery

## 2024-06-02 VITALS — Wt 299.0 lb

## 2024-06-02 DIAGNOSIS — Z96651 Presence of right artificial knee joint: Secondary | ICD-10-CM

## 2024-06-02 MED ORDER — PREDNISONE 50 MG PO TABS: ORAL_TABLET | ORAL | 0 refills | Status: AC

## 2024-06-02 NOTE — Progress Notes (Signed)
 The patient is now just past 10 weeks status post a right total knee arthroplasty.  Her postoperative course was complicated by foot drop.  She had severe and stage arthritis of her right knee prior to surgery and her BMI is almost 47 which certainly made things more difficult from retracting standpoint.  However her knee was severely arthritic.  The knee is doing better overall.  The small wound at the inferior aspect of her incision looks better and is healing.  Her foot drop on the right side is still present.  She has a brace that is being fitted for her that she picks up next week from [facility].  She will continue to increase her activities as she tolerates.  I will send in 6 days of a steroid with 50 mg of prednisone  to see if this would help with inflammatory type of pain.  She is on chronic narcotics as well which can contribute to some of her pain.  Hopefully we will see the foot drop improve over time.  At her next visit in 3 months we will have an AP and lateral of her right knee.

## 2024-06-06 ENCOUNTER — Other Ambulatory Visit: Payer: Self-pay | Admitting: Orthopaedic Surgery

## 2024-06-17 ENCOUNTER — Other Ambulatory Visit: Payer: Self-pay

## 2024-06-17 ENCOUNTER — Telehealth: Payer: Self-pay

## 2024-06-17 DIAGNOSIS — M21371 Foot drop, right foot: Secondary | ICD-10-CM

## 2024-06-17 DIAGNOSIS — Z96651 Presence of right artificial knee joint: Secondary | ICD-10-CM

## 2024-06-17 NOTE — Telephone Encounter (Signed)
 Referral placed.

## 2024-06-17 NOTE — Telephone Encounter (Signed)
 Patient called states the foot drop is getting no better and it is so painful. She states she wears the brace during the day but can't at night Wondering if there is anything you suggest she do/wear at night

## 2024-06-17 NOTE — Telephone Encounter (Signed)
 She wants a nerve study of some sort on her legs

## 2024-06-20 ENCOUNTER — Other Ambulatory Visit: Payer: Self-pay | Admitting: Orthopaedic Surgery

## 2024-07-10 ENCOUNTER — Other Ambulatory Visit: Payer: Self-pay | Admitting: Orthopaedic Surgery

## 2024-07-12 ENCOUNTER — Telehealth: Payer: Self-pay

## 2024-07-12 ENCOUNTER — Telehealth: Payer: Self-pay | Admitting: Orthopaedic Surgery

## 2024-07-12 NOTE — Telephone Encounter (Signed)
 Crystal from [employer] called stating they fax an order for recovery neobiotic device. Please cal Crystal about this matter at [phone].

## 2024-07-12 NOTE — Telephone Encounter (Signed)
 [name] with [facility] would like a call from Dr. Vernetta concerning [employer]us[employer] device for patient.  CB# [phone].  Please advise.  Thank you.

## 2024-07-13 NOTE — Telephone Encounter (Signed)
 Faxed form/Rx today

## 2024-07-14 ENCOUNTER — Ambulatory Visit (INDEPENDENT_AMBULATORY_CARE_PROVIDER_SITE_OTHER): Admitting: Physical Medicine and Rehabilitation

## 2024-07-14 DIAGNOSIS — Z96651 Presence of right artificial knee joint: Secondary | ICD-10-CM

## 2024-07-14 DIAGNOSIS — R202 Paresthesia of skin: Secondary | ICD-10-CM

## 2024-07-14 DIAGNOSIS — M21371 Foot drop, right foot: Secondary | ICD-10-CM | POA: Diagnosis not present

## 2024-07-14 NOTE — Progress Notes (Unsigned)
 [name] - 63 y.o. female MRN [id number]  Date of birth: 20-Mar-1961  Office Visit Note: Visit Date: 07/14/2024 PCP: Shelda Atlas, MD Referred by: Vernetta Lonni GRADE*  Subjective: Chief Complaint  Patient presents with   Right Foot - Pain, Numbness, Weakness   HPI: [name] is a 63 y.o. female who comes in today at the request of Dr. Lonni Vernetta for evaluation and management of chronic, worsening and severe pain weakness, numbness and tingling in the Right lower extremities.  Patient is Right hand dominant.  She is 15 weeks status post a right total knee arthroplasty.  Her postoperative course was complicated by foot drop.  The knee is doing better overall.  She reports no real movement with dorsiflexion or EHL that she can plantarflex the left foot.  No symptoms on the right.  She does get a burning numbness sensation into the foot more of the top of the foot but can extend to the calf.  She has not noted any weakness with knee extension or flexion or hip flexion.  No radicular pain down the leg.  MRI of the lumbar spine has been performed with no focal nerve compression.  She had had a history of some back problems in the past no prior surgery.  She has no history of diabetes or thyroid  disease.  History of prediabetes.  She has been taking nerve membrane stabilizing type medications without much relief of pain.  Some pain medication.  She has tried to work with [facility] but is having difficulty with that.  She does report full strength of the left lower extremity prior to surgery.   I spent more than 30 minutes speaking face-to-face with the patient with 50% of the time in counseling and discussing coordination of care.        Review of Systems  Musculoskeletal:  Positive for joint pain.  Neurological:  Positive for tingling and focal weakness.  All other systems reviewed and are negative.  Otherwise per HPI.  Assessment & Plan: Visit Diagnoses:     ICD-10-CM   1. Paresthesia of skin  R20.2 NCV with EMG (electromyography)    2. Acquired right foot drop  M21.371 NCV with EMG (electromyography)    3. Status post total right knee replacement  Z96.651 NCV with EMG (electromyography)       Plan: Impression: The above electrodiagnostic study is ABNORMAL but somewhat difficult to interpret do to body habitus of the lower extremity with edema.  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  There seems to be more of an injury to the fibular nerve component but there is clearly some needle EMG findings in the right gastroc.  **There were small motor unit action potentials in the gastroc and there was a background noise of activation with dorsiflexion and EHL.  No full motor unit action potentials seen in this portends a poor outcome for recovery but the chance of recovery is not Zero.  There is no significant electrodiagnostic evidence of any other lumbosacral plexopathy or lumbar radiculopathy.   Recommendations: 1.  Follow-up with referring physician. 2.  Continue current management of symptoms. 3.  Continue with AFO and physical therapy and medications.  Consider if there is anything from an imaging standpoint that could see a cause for nerve compression ongoing.  Consider repeat study in 3 months if no improvement.  Meds &  Orders: No orders of the defined types were placed in this encounter.   Orders Placed This Encounter  Procedures   NCV with EMG (electromyography)    Follow-up: Return for Lonni Poli, MD.   Procedures: No procedures performed  EMG & NCV Findings: Evaluation of the right Dp Br Fibular motor nerve showed prolonged distal onset latency (Fib Head, 14.1 ms), prolonged distal onset latency (Poplit, 23.6 ms), and decreased conduction velocity (Poplit-Fib Head, 13 m/s).  The right fibular motor nerve  showed prolonged distal onset latency (8.8 ms), reduced amplitude (0.0 mV), and decreased conduction velocity (Poplt-B Fib, 24 m/s).  The right tibial motor nerve showed reduced amplitude (1.5 mV).    Needle evaluation of the right anterior tibialis and the right Fibularis Longus muscles showed increased insertional activity and widespread spontaneous activity.  The right medial gastrocnemius muscle showed moderately increased spontaneous activity and diminished recruitment.  All remaining muscles (as indicated in the following table) showed no evidence of electrical instability.    Impression: The above electrodiagnostic study is ABNORMAL but somewhat difficult to interpret do to body habitus of the lower extremity with edema.  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  There seems to be more of an injury to the fibular nerve component but there is clearly some needle EMG findings in the right gastroc.  **There were small motor unit action potentials in the gastroc and there was a background noise of activation with dorsiflexion and EHL.  No full motor unit action potentials seen in this portends a poor outcome for recovery but the chance of recovery is not Zero.  There is no significant electrodiagnostic evidence of any other lumbosacral plexopathy or lumbar radiculopathy.   Recommendations: 1.  Follow-up with referring physician. 2.  Continue current management of symptoms. 3.  Continue with AFO and physical therapy and medications.  Consider if there is anything from an imaging standpoint that could see a cause for nerve compression ongoing.  Consider repeat study in 3 months if no improvement.  ___________________________ [facility] Board Certified, American Board of Physical Medicine and Rehabilitation    Nerve Conduction Studies Motor Summary Table   Stim Site NR  Onset (ms) Norm Onset (ms) O-P Amp (mV) Norm O-P Amp Site1 Site2 Delta-0 (ms) Dist (cm) Vel (m/s) Norm Vel (m/s)  Right Dp Br Fibular Motor (AntTibialis)  27.7C  Fib Head    *14.1 <4.2 0.0  Poplit Fib Head 9.5 12.0 *13 >40.5  Poplit    *23.6 <5.7 0.0         Right Fibular Motor (Ext Dig Brev)  27.5C  Ankle    *8.8 <6.1 *0.0 >2.5 B Fib Ankle 2.5 33.0 132 >38  B Fib    11.3  0.0  Poplt B Fib 4.9 12.0 *24 >40  Poplt    6.4  0.0         Right Tibial Motor (Abd Hall Brev)  27.6C  Ankle    5.2 <6.1 *1.5 >3.0 Knee Ankle 7.8 38.0 49 >35  Knee    13.0  0.2          EMG   Side Muscle Nerve Root Ins Act Fibs Psw Amp Dur Poly Recrt Int Bruna Comment  Right AntTibialis Dp Br Peron L4-5 *Incr *4+ *4+ Nml Nml 0 Nml Nml background sound possible MUAP  Right Fibularis Longus  Sup Br Peron  L5-S1 *Incr *4+ *4+ Nml Nml 0 Nml Nml background sound possible MUAP  Right MedGastroc Tibial S1-2 Nml *2+ *2+ Nml Nml 0 *Reduced Nml pos MUAP  Right VastusMed Femoral L2-4 Nml Nml Nml Nml Nml 0 Nml Nml   Right BicepsFemS Sciatic L5-S1 Nml Nml Nml Nml Nml 0 Nml Nml     Nerve Conduction Studies Motor Left/Right Comparison   Stim Site L Lat (ms) R Lat (ms) L-R Lat (ms) L Amp (mV) R Amp (mV) L-R Amp (%) Site1 Site2 L Vel (m/s) R Vel (m/s) L-R Vel (m/s)  Dp Br Fibular Motor (AntTibialis)  27.7C  Fib Head  *14.1   0.0  Poplit Fib Head  *13   Poplit  *23.6   0.0        Fibular Motor (Ext Dig Brev)  27.5C  Ankle  *8.8   *0.0  B Fib Ankle  132   B Fib  11.3   0.0  Poplt B Fib  *24   Poplt  6.4   0.0        Tibial Motor (Abd Hall Brev)  27.6C  Ankle  5.2   *1.5  Knee Ankle  49   Knee  13.0   0.2           Waveforms:        Clinical History: MRI LUMBAR SPINE WITHOUT CONTRAST   TECHNIQUE: Multiplanar, multisequence MR imaging of the lumbar spine was performed. No intravenous contrast was administered.   COMPARISON:  Radiography 08/14/2023   FINDINGS: Segmentation:  5 lumbar type vertebral bodies.    Alignment:  2 mm degenerative anterolisthesis L4-5.   Vertebrae:  No fracture or focal bone lesion.   Conus medullaris and cauda equina: Conus extends to the L1 level. Conus and cauda equina appear normal.   Paraspinal and other soft tissues: Negative   Disc levels:   No significant finding from T11-12 through L1-2.   L2-3: Shallow protrusion of the disc with slight upward turning behind the inferior endplate of L2. Bilateral facet degeneration with facet and ligamentous hypertrophy. Mild multifactorial stenosis at this level. Definite focal neural compression is not demonstrated however.   L3-4: Bulging of the disc slightly more prominent towards the right. Mild facet and ligamentous hypertrophy. No compressive stenosis.   L4-5: Bilateral facet osteoarthritis with 2 mm of degenerative anterolisthesis. Minimal bulging of the disc. No compressive stenosis of the canal or foramina. Facet arthritis could be painful.   L5-S1: Mild bulging of the disc. Mild bilateral facet osteoarthritis. No stenosis or neural compression.   IMPRESSION: 1. L2-3: Shallow protrusion of the disc with slight upward turning behind the inferior endplate of L2. Bilateral facet degeneration with facet and ligamentous hypertrophy. Mild multifactorial stenosis at this level. Definite focal neural compression is not demonstrated however. 2. L3-4: Bulging of the disc slightly more prominent towards the right. Mild facet and ligamentous hypertrophy. No compressive stenosis. 3. L4-5: Bilateral facet osteoarthritis with 2 mm of degenerative anterolisthesis. Minimal bulging of the disc. No compressive stenosis. Facet arthritis could be painful. 4. L5-S1: Mild bulging of the disc. Mild bilateral facet osteoarthritis. No compressive stenosis or neural compression.     Electronically Signed   By: Oneil Officer M.D.   On: 09/22/2023 11:27   She reports that she has never smoked. She has never used smokeless  tobacco. No results for input(s): HGBA1C, LABURIC in the last 8760 hours.  Objective:  VS:  HT:    WT:   BMI:  BP:   HR: bpm  TEMP: ( )  RESP:  Physical Exam Vitals and nursing note reviewed.  Constitutional:      General: She is not in acute distress.    Appearance: Normal appearance. She is well-developed. She is obese. She is not ill-appearing.  HENT:     Head: Normocephalic and atraumatic.  Eyes:     Conjunctiva/sclera: Conjunctivae normal.     Pupils: Pupils are equal, round, and reactive to light.  Cardiovascular:     Rate and Rhythm: Normal rate.     Pulses: Normal pulses.  Pulmonary:     Effort: Pulmonary effort is normal.  Musculoskeletal:        General: Tenderness present.     Right lower leg: No edema.     Left lower leg: No edema.     Comments: Examination of the lower extremities shows significant bulk from obesity and on the left there is apparent atrophy of the intrinsic foot musculature and EDB.  There is no color change or allodynia.  There is some edema which is dependent edema.  Surgical scars present anteriorly on both knees are well-healed.  She has no pain about the joint line.  No pain over the fibular head bilaterally.  Negative slump test.  Decree sensation to light touch and somewhat of a mixed dermatome more sciatic nerve but clearly deep peroneal is numb.  She has intact sensation in the upper thighs in all dermatomes.  Skin:    General: Skin is warm and dry.     Findings: No erythema or rash.  Neurological:     General: No focal deficit present.     Mental Status: She is alert and oriented to person, place, and time.     Cranial Nerves: No cranial nerve deficit.     Sensory: Sensory deficit present.     Motor: Weakness present. No abnormal muscle tone.     Coordination: Coordination normal.     Gait: Gait abnormal.  Psychiatric:        Mood and Affect: Mood normal.        Behavior: Behavior normal.     Ortho Exam  Imaging: No  results found.  Past Medical/Family/Surgical/Social History: Medications & Allergies reviewed per EMR, new medications updated. Patient Active Problem List   Diagnosis Date Noted   Status post total right knee replacement 03/19/2024   Visit for routine gyn exam 02/25/2022   Vitamin D  deficiency 02/21/2021   Essential hypertension 02/21/2021   Pure hypercholesterolemia 02/21/2021   Class 3 severe obesity with serious comorbidity and body mass index (BMI) of 50.0 to 59.9 in adult 06/13/2020   Prediabetes 06/13/2020   Leg pain 06/13/2020   Status post total left knee replacement 01/17/2017   Degenerative disc disease, cervical    Past Medical History:  Diagnosis Date   Anxiety    Asthma    Back pain    Chronic pain    DDD (degenerative disc disease), lumbar    Degenerative disc disease    Degenerative disc disease, cervical    Degenerative disc disease, cervical    Fibroid    Fibromyalgia    GERD (gastroesophageal reflux disease)    Headache    regular   Heartburn    Hypertension    Joint pain    Localized swelling of both lower legs    Osteoarthritis    Other specified disorders of thyroid     Pre-diabetes    Rheumatoid arthritis (HCC)  Sleep apnea    mild, patient does not use mask   SOB (shortness of breath)    Family History  Problem Relation Age of Onset   Cancer Mother    Hypertension Mother    Eating disorder Mother    Obesity Mother    Diabetes Father    Heart disease Father    High Cholesterol Father    Kidney disease Father    Alcoholism Father    Eating disorder Father    Cancer Sister    Cancer Brother    Past Surgical History:  Procedure Laterality Date   ANTERIOR CERVICAL DECOMP/DISCECTOMY FUSION  1998   C4-7   NOVASURE ABLATION     TOTAL KNEE ARTHROPLASTY Left 01/17/2017   Procedure: LEFT TOTAL KNEE ARTHROPLASTY;  Surgeon: Lonni CINDERELLA Poli, MD;  Location: [facility];  Service: Orthopedics;  Laterality: Left;   TOTAL KNEE ARTHROPLASTY  Right 03/19/2024   Procedure: ARTHROPLASTY, KNEE, TOTAL;  Surgeon: Poli Lonni CINDERELLA, MD;  Location: [facility];  Service: Orthopedics;  Laterality: Right;   TUBAL LIGATION     Social History   Occupational History   Occupation: n/a  Tobacco Use   Smoking status: Never   Smokeless tobacco: Never  Vaping Use   Vaping status: Never Used  Substance and Sexual Activity   Alcohol use: Not Currently   Drug use: No   Sexual activity: Yes    Birth control/protection: Surgical

## 2024-07-14 NOTE — Procedures (Unsigned)
 EMG & NCV Findings: Evaluation of the right Dp Br Fibular motor nerve showed prolonged distal onset latency (Fib Head, 14.1 ms), prolonged distal onset latency (Poplit, 23.6 ms), and decreased conduction velocity (Poplit-Fib Head, 13 m/s).  The right fibular motor nerve showed prolonged distal onset latency (8.8 ms), reduced amplitude (0.0 mV), and decreased conduction velocity (Poplt-B Fib, 24 m/s).  The right tibial motor nerve showed reduced amplitude (1.5 mV).    Needle evaluation of the right anterior tibialis and the right Fibularis Longus muscles showed increased insertional activity and widespread spontaneous activity.  The right medial gastrocnemius muscle showed moderately increased spontaneous activity and diminished recruitment.  All remaining muscles (as indicated in the following table) showed no evidence of electrical instability.    Impression: The above electrodiagnostic study is ABNORMAL but somewhat difficult to interpret do to body habitus of the lower extremity with edema.  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  There seems to be more of an injury to the fibular nerve component but there is clearly some needle EMG findings in the right gastroc.  **There were small motor unit action potentials in the gastroc and there was a background noise of activation with dorsiflexion and EHL.  No full motor unit action potentials seen in this portends a poor outcome for recovery but the chance of recovery is not Zero.  There is no significant electrodiagnostic evidence of any other lumbosacral plexopathy or lumbar radiculopathy.   Recommendations: 1.  Follow-up with referring physician. 2.  Continue current management of symptoms. 3.  Continue with AFO and physical therapy and medications.  Consider if there is anything from an imaging standpoint that could see a cause for nerve  compression ongoing.  Consider repeat study in 3 months if no improvement.  ___________________________ [facility] Board Certified, American Board of Physical Medicine and Rehabilitation    Nerve Conduction Studies Motor Summary Table   Stim Site NR Onset (ms) Norm Onset (ms) O-P Amp (mV) Norm O-P Amp Site1 Site2 Delta-0 (ms) Dist (cm) Vel (m/s) Norm Vel (m/s)  Right Dp Br Fibular Motor (AntTibialis)  27.7C  Fib Head    *14.1 <4.2 0.0  Poplit Fib Head 9.5 12.0 *13 >40.5  Poplit    *23.6 <5.7 0.0         Right Fibular Motor (Ext Dig Brev)  27.5C  Ankle    *8.8 <6.1 *0.0 >2.5 B Fib Ankle 2.5 33.0 132 >38  B Fib    11.3  0.0  Poplt B Fib 4.9 12.0 *24 >40  Poplt    6.4  0.0         Right Tibial Motor (Abd Hall Brev)  27.6C  Ankle    5.2 <6.1 *1.5 >3.0 Knee Ankle 7.8 38.0 49 >35  Knee    13.0  0.2          EMG   Side Muscle Nerve Root Ins Act Fibs Psw Amp Dur Poly Recrt Int Bruna Comment  Right AntTibialis Dp Br Peron L4-5 *Incr *4+ *4+ Nml Nml 0 Nml Nml background sound possible MUAP  Right Fibularis Longus  Sup Br Peron L5-S1 *Incr *4+ *4+ Nml Nml 0 Nml Nml background sound possible MUAP  Right MedGastroc Tibial S1-2 Nml *2+ *2+ Nml Nml 0 *Reduced Nml pos MUAP  Right VastusMed Femoral L2-4 Nml Nml Nml Nml Nml 0 Nml Nml  Right BicepsFemS Sciatic L5-S1 Nml Nml Nml Nml Nml 0 Nml Nml     Nerve Conduction Studies Motor Left/Right Comparison   Stim Site L Lat (ms) R Lat (ms) L-R Lat (ms) L Amp (mV) R Amp (mV) L-R Amp (%) Site1 Site2 L Vel (m/s) R Vel (m/s) L-R Vel (m/s)  Dp Br Fibular Motor (AntTibialis)  27.7C  Fib Head  *14.1   0.0  Poplit Fib Head  *13   Poplit  *23.6   0.0        Fibular Motor (Ext Dig Brev)  27.5C  Ankle  *8.8   *0.0  B Fib Ankle  132   B Fib  11.3   0.0  Poplt B Fib  *24   Poplt  6.4   0.0        Tibial Motor (Abd Hall Brev)  27.6C  Ankle  5.2   *1.5  Knee Ankle  49   Knee  13.0   0.2           Waveforms:

## 2024-07-14 NOTE — Progress Notes (Unsigned)
 Pain Scale   Average Pain 8 Patient advising she has pain and numbness,tingling in her Right foot. Patient advising this began after her Knee surgery.        +Driver, -BT, -Dye Allergies.

## 2024-07-15 ENCOUNTER — Encounter: Payer: Self-pay | Admitting: Physical Medicine and Rehabilitation

## 2024-07-19 ENCOUNTER — Ambulatory Visit: Admitting: Plastic Surgery

## 2024-07-19 ENCOUNTER — Telehealth: Payer: Self-pay | Admitting: Orthopaedic Surgery

## 2024-07-19 NOTE — Telephone Encounter (Signed)
 Pt called stating a call back concerning Nerve study she got by [facility]. Pt states she would like to discuss results of nerve study [phone].

## 2024-07-20 ENCOUNTER — Telehealth: Payer: Self-pay | Admitting: Orthopaedic Surgery

## 2024-07-20 NOTE — Telephone Encounter (Signed)
 Pt called and made an appt. Pt still asked for a call from [facility]. Please call pt at [phone]

## 2024-07-21 ENCOUNTER — Other Ambulatory Visit: Payer: Self-pay | Admitting: Orthopaedic Surgery

## 2024-07-21 ENCOUNTER — Telehealth: Payer: Self-pay | Admitting: Orthopaedic Surgery

## 2024-07-21 MED ORDER — GABAPENTIN 300 MG PO CAPS: 300.0000 mg | ORAL_CAPSULE | Freq: Three times a day (TID) | ORAL | 1 refills | Status: DC

## 2024-07-21 NOTE — Telephone Encounter (Signed)
 Patient called returning your call. CB#[phone]

## 2024-07-28 ENCOUNTER — Ambulatory Visit (HOSPITAL_COMMUNITY)
Admission: RE | Admit: 2024-07-28 | Discharge: 2024-07-28 | Disposition: A | Discharge status: Home / Self Care | Source: Ambulatory Visit | Attending: Orthopaedic Surgery | Admitting: Orthopaedic Surgery

## 2024-07-28 ENCOUNTER — Other Ambulatory Visit: Payer: Self-pay

## 2024-07-28 ENCOUNTER — Telehealth: Payer: Self-pay | Admitting: Orthopaedic Surgery

## 2024-07-28 DIAGNOSIS — Z96651 Presence of right artificial knee joint: Secondary | ICD-10-CM | POA: Insufficient documentation

## 2024-07-28 NOTE — Telephone Encounter (Signed)
 Patient called. Says her leg is really tight, not hot and not red. Just from her foot up it's tight. She can not walk. Would like to be seen today. Concerned that it may be a blood clot. She would like a call. [phone]

## 2024-07-29 ENCOUNTER — Encounter (HOSPITAL_COMMUNITY)

## 2024-08-05 ENCOUNTER — Ambulatory Visit: Admitting: Plastic Surgery

## 2024-08-09 ENCOUNTER — Encounter: Payer: Self-pay | Admitting: Physician Assistant

## 2024-08-09 ENCOUNTER — Ambulatory Visit (INDEPENDENT_AMBULATORY_CARE_PROVIDER_SITE_OTHER): Admitting: Physician Assistant

## 2024-08-09 VITALS — Ht 66.0 in | Wt 304.8 lb

## 2024-08-09 DIAGNOSIS — Z96651 Presence of right artificial knee joint: Secondary | ICD-10-CM | POA: Diagnosis not present

## 2024-08-09 DIAGNOSIS — M21371 Foot drop, right foot: Secondary | ICD-10-CM

## 2024-08-09 NOTE — Progress Notes (Signed)
 HPI: [name] returns today to go over the nerve study right lower extremity performed by Dr. Eldonna 07/14/2024.  Results have already been reviewed with the patient by Dr. Vernetta.  EMG nerve conduction study showed an abnormal study with severe sciatic nerve injury seemingly at the bifurcation of the tibial and fibular nerves at the knee.  She is felt to be injury to the fibular nerve component with some findings in the right gastroc region.  She continues to work with therapy for her foot drop.  She is having severe pain particularly in her foot.  She has failed Lyrica  and gabapentin .  She comes in today wearing slippers stating that she is having difficulty finding shoes that are comfortable.  She is going back to [facility] tomorrow for her AFO as this was not beneficial in the way that it was fashioned.  Right total knee arthroplasty for 03/19/24.  Physical exam: General: Well-developed well-nourished female no acute distress mood affect appropriate Right knee: Full extension full flexion no gross instability.  Surgical incisions well-healed. Right foot she can slightly dorsiflex her toes.  Otherwise foot drop remains.  Impression: Status post right total knee arthroplasty Right foot drop  Plan: Continue physical therapy.  Discussed possibly placing her on amitriptyline however she is on losartan  and this could definitely lead to hypotension therefore patient is not willing to try the amitriptyline.  Therefore we will place her on vitamin C 500 mg twice daily and a vitamin B complex.  Will have her follow-up with Dr. Vernetta in 1 month to see how she is doing overall.  Questions were encouraged and answered at length.

## 2024-08-21 ENCOUNTER — Emergency Department (HOSPITAL_COMMUNITY)
Admission: EM | Admit: 2024-08-21 | Discharge: 2024-08-21 | Disposition: A | Discharge status: Home / Self Care | Attending: Emergency Medicine | Admitting: Emergency Medicine

## 2024-08-21 ENCOUNTER — Telehealth (HOSPITAL_COMMUNITY): Payer: Self-pay | Admitting: Emergency Medicine

## 2024-08-21 ENCOUNTER — Other Ambulatory Visit: Payer: Self-pay

## 2024-08-21 ENCOUNTER — Encounter (HOSPITAL_COMMUNITY): Payer: Self-pay | Admitting: Emergency Medicine

## 2024-08-21 DIAGNOSIS — Z79899 Other long term (current) drug therapy: Secondary | ICD-10-CM | POA: Diagnosis not present

## 2024-08-21 DIAGNOSIS — U071 COVID-19: Secondary | ICD-10-CM | POA: Insufficient documentation

## 2024-08-21 DIAGNOSIS — Z794 Long term (current) use of insulin: Secondary | ICD-10-CM | POA: Insufficient documentation

## 2024-08-21 DIAGNOSIS — I10 Essential (primary) hypertension: Secondary | ICD-10-CM | POA: Diagnosis not present

## 2024-08-21 DIAGNOSIS — Z7982 Long term (current) use of aspirin: Secondary | ICD-10-CM | POA: Diagnosis not present

## 2024-08-21 DIAGNOSIS — R0981 Nasal congestion: Secondary | ICD-10-CM | POA: Diagnosis present

## 2024-08-21 LAB — RESP PANEL BY RT-PCR (RSV, FLU A&B, COVID)  RVPGX2
Influenza A by PCR: NEGATIVE
Influenza B by PCR: NEGATIVE
Resp Syncytial Virus by PCR: NEGATIVE
SARS Coronavirus 2 by RT PCR: POSITIVE — AB

## 2024-08-21 MED ORDER — BENZONATATE 100 MG PO CAPS: 100.0000 mg | ORAL_CAPSULE | Freq: Three times a day (TID) | ORAL | 0 refills | Status: AC

## 2024-08-21 MED ORDER — BENZONATATE 100 MG PO CAPS: 100.0000 mg | ORAL_CAPSULE | Freq: Three times a day (TID) | ORAL | 0 refills | Status: DC

## 2024-08-21 MED ORDER — IPRATROPIUM-ALBUTEROL 0.5-2.5 (3) MG/3ML IN SOLN
3.0000 mL | Freq: Once | RESPIRATORY_TRACT | Status: AC
  Administered 2024-08-21: 3 mL via RESPIRATORY_TRACT
  Filled 2024-08-21: qty 3

## 2024-08-21 MED ORDER — OXYCODONE-ACETAMINOPHEN 5-325 MG PO TABS
2.0000 | ORAL_TABLET | Freq: Once | ORAL | Status: AC
  Administered 2024-08-21: 2 via ORAL
  Filled 2024-08-21: qty 2

## 2024-08-21 MED ORDER — BENZONATATE 100 MG PO CAPS
100.0000 mg | ORAL_CAPSULE | Freq: Once | ORAL | Status: AC
  Administered 2024-08-21: 100 mg via ORAL
  Filled 2024-08-21: qty 1

## 2024-08-21 NOTE — Discharge Instructions (Addendum)
 You tested positive for covid-19 today. Can use tessalon  to help with cough.  Continue your inhalers and breathing treatments as needed. Follow-up with your doctor. Return to the ED for new or worsening symptoms.

## 2024-08-21 NOTE — ED Triage Notes (Signed)
 Patient BIB EMS from home c/o nasal congestion x 2 days. Patient report taking OTC medication without relief. Patient denies fever. Patient denies SOB.  BP 144/78 HR 80 RR 20 O2sat 99% on RA

## 2024-08-21 NOTE — ED Provider Notes (Signed)
 [place] EMERGENCY DEPARTMENT AT [facility] Provider Note   CSN: [id number] Arrival date & time: 08/21/24  0116     Patient presents with: Nasal Congestion   [name] is a 63 y.o. female.   The history is provided by the patient and medical records.   63 year old female with history of hypertension, anxiety, fibromyalgia, rheumatoid arthritis, chronic pain, GERD, presenting to the ED with nasal congestion for the past 2 days.  States she has had a dry cough.  She has not had any nausea or vomiting.  States she can still eat and drink normally, normal smell.  She does report her friend who takes her to [facility] for grocery shopping did recently have COVID but she saw her once all of her symptoms have resolved so did not think she was infectious.  He did try taking some Mucinex and Norel at home without relief.  Patient also requesting home pain meds, chronic pain associated with her right knee.  This is largely unchanged.  She is followed by orthopedics and pain management for these issues.  Prior to Admission medications   Medication Sig Start Date End Date Taking? Authorizing Provider  benzonatate  (TESSALON ) 100 MG capsule Take 1 capsule (100 mg total) by mouth every 8 (eight) hours. 08/21/24  Yes Jarold Olam HERO, PA-C  albuterol  (PROVENTIL  HFA;VENTOLIN  HFA) 108 (90 BASE) MCG/ACT inhaler Inhale 2 puffs into the lungs every 6 (six) hours as needed for shortness of breath.     [provider]  albuterol  (PROVENTIL ) (2.5 MG/3ML) 0.083% nebulizer solution Take 2.5 mg by nebulization every 6 (six) hours as needed for wheezing or shortness of breath.    [provider]  ALPRAZolam  (XANAX ) 1 MG tablet Take 1 mg by mouth daily as needed for anxiety. 03/01/24   [provider]  aspirin  81 MG chewable tablet Chew 1 tablet (81 mg total) by mouth 2 (two) times daily. 03/23/24   Vernetta Lonni GRADE, MD  atenolol  (TENORMIN ) 50 MG tablet Take 50 mg by mouth  daily. 03/14/21   [provider]  cetirizine (ZYRTEC) 10 MG tablet Take 10 mg by mouth daily.    [provider]  chlorhexidine  (HIBICLENS ) 4 % external liquid Apply 15 mLs (1 Application total) topically as directed for 30 doses. Use as directed daily for 5 days every other week for 6 weeks. 03/19/24   Vernetta Lonni GRADE, MD  Cholecalciferol (VITAMIN D3) 125 MCG (5000 UT) CAPS Take 1 capsule (5,000 Units total) by mouth daily. 04/23/21   Delores Shields A, DO  cyclobenzaprine  (FLEXERIL ) 10 MG tablet Take 1 tablet (10 mg total) by mouth 3 (three) times daily as needed for muscle spasms. 03/23/24   Vernetta Lonni GRADE, MD  doxycycline  (VIBRA -TABS) 100 MG tablet Take 1 tablet (100 mg total) by mouth 2 (two) times daily. 04/09/24   Vernetta Lonni GRADE, MD  FLOVENT HFA 220 MCG/ACT inhaler Inhale 2 puffs into the lungs 2 (two) times daily as needed (shortness of breath). 03/10/17   [provider]  fluticasone (FLONASE) 50 MCG/ACT nasal spray Place 2 sprays into both nostrils daily as needed for allergies. 01/29/24   [provider]  furosemide (LASIX) 20 MG tablet Take 20 mg by mouth daily as needed for fluid or edema.    [provider]  HYDROmorphone  (DILAUDID ) 4 MG tablet Take 1 tablet (4 mg total) by mouth every 4 (four) hours as needed for severe pain (pain score 7-10). 03/26/24   Vernetta,  Lonni GRADE, MD  ibuprofen  (ADVIL ,MOTRIN ) 800 MG tablet Take 800 mg by mouth 3 (three) times daily as needed for moderate pain.  08/28/15   [provider]  Insulin Pen Needle (BD PEN NEEDLE NANO 2ND GEN) 32G X 4 MM MISC 1 Package by Does not apply route 2 (two) times daily. 01/09/21   Therisa Arabia, PA-C  losartan -hydrochlorothiazide  (HYZAAR) 100-25 MG tablet Take 1 tablet by mouth daily. 12/28/21   [provider]  meclizine  (ANTIVERT ) 25 MG tablet Take 1 tablet (25 mg total) by mouth 3 (three) times daily as needed for dizziness. 11/09/23   Hildegard,  Amjad, PA-C  NOREL AD 4-10-325 MG TABS Take 1 tablet by mouth 2 (two) times daily as needed (allergies). 09/01/20   [provider]  Oxycodone  HCl 20 MG TABS TAKE 1 TABLET BY MOUTH EVERY 4-6 HOURS (MAX OF 5 PER DAY) 03/10/17   [provider]  OZEMPIC , 0.25 OR 0.5 MG/DOSE, 2 MG/1.5ML SOPN INJECT 0.25MG  INTO THE SKIN ONE TIME PER WEEK 04/30/21   Delores Shields A, DO  potassium chloride  SA (KLOR-CON ) 20 MEQ tablet Take 2 tablets (40 mEq total) by mouth daily for 7 days. 11/12/21 08/09/24  Mesner, Selinda, MD  predniSONE  (DELTASONE ) 50 MG tablet Take one tablet daily for next 6 days with a meal 06/02/24   Vernetta Lonni GRADE, MD  Semaglutide , 2 MG/DOSE, (OZEMPIC , 2 MG/DOSE,) 8 MG/3ML SOPN Inject 2 mg into the skin once a week. 11/19/22     tiZANidine  (ZANAFLEX ) 4 MG tablet Take 4 mg by mouth 2 (two) times daily as needed. 01/06/24   [provider]    Allergies: Patient has no known allergies.    Review of Systems  HENT:  Positive for congestion.   Respiratory:  Positive for cough.   All other systems reviewed and are negative.   Updated Vital Signs BP (!) 142/67 (BP Location: Left Arm)   Pulse 80   Temp 99 F (37.2 C) (Oral)   Resp 18   LMP 04/20/2011   SpO2 99%   Physical Exam Vitals and nursing note reviewed.  Constitutional:      Appearance: She is well-developed. She is obese.  HENT:     Head: Normocephalic and atraumatic.  Eyes:     Conjunctiva/sclera: Conjunctivae normal.     Pupils: Pupils are equal, round, and reactive to light.  Cardiovascular:     Rate and Rhythm: Normal rate and regular rhythm.     Heart sounds: Normal heart sounds.  Pulmonary:     Effort: Pulmonary effort is normal.     Breath sounds: Normal breath sounds. No wheezing or rhonchi.     Comments: Dry, hacking cough, no audible wheezes or rhonchi, able to speak in sentences without difficulty, sats 97 to 100% during exam Musculoskeletal:        General: Normal range of motion.      Cervical back: Normal range of motion.     Comments: Well-healed right TKA incision, ambulatory with walker, does have foot drop which is known/chronic, not wearing AFO brace  Skin:    General: Skin is warm and dry.  Neurological:     Mental Status: She is alert and oriented to person, place, and time.     (all labs ordered are listed, but only abnormal results are displayed) Labs Reviewed  RESP PANEL BY RT-PCR (RSV, FLU A&B, COVID)  [id number] - Abnormal; Notable for the following components:      Result Value  SARS Coronavirus 2 by RT PCR POSITIVE (*)    All other components within normal limits    EKG: None  Radiology: No results found.   Procedures   Medications Ordered in the ED  benzonatate  (TESSALON ) capsule 100 mg (100 mg Oral Given by Other 08/21/24 0523)  ipratropium-albuterol  (DUONEB) 0.5-2.5 (3) MG/3ML nebulizer solution 3 mL (3 mLs Nebulization Given 08/21/24 0524)  oxyCODONE -acetaminophen  (PERCOCET/ROXICET) 5-325 MG per tablet 2 tablet (2 tablets Oral Given 08/21/24 0523)                                    Medical Decision Making Risk Prescription drug management.   63 year old female presenting to the ED with cough and nasal congestion for the past 2 days.  Friend recently with COVID-19.  She is afebrile, nontoxic in appearance here.  Lungs are clear without any wheezes or rhonchi, she is in no acute distress.  RVP is positive for COVID-19.  She was treated here with cough medication and DuoNeb.  She was also given her home meds for her chronic right leg pain.  She is followed by orthopedics for this chronically.    She has not required supplemental oxygen and is hemodynamically stable, feel she is stable for outpatient management.  I recommended that she follow-up closely with her primary care doctor.  Can continue home nebs and inhalers when needed.  Return here for new concerns.  Final diagnoses:  COVID-19    ED Discharge Orders          Ordered     benzonatate  (TESSALON ) 100 MG capsule  Every 8 hours        08/21/24 0601               Jarold Olam HERO, PA-C 08/21/24 [id number]    Trine Raynell Moder, MD 08/21/24 [phone]

## 2024-08-21 NOTE — Telephone Encounter (Signed)
 Nursing reports that patient needed prescription resent to pharmacy.  Will try to resend it to correct pharmacy.

## 2024-09-01 ENCOUNTER — Encounter: Payer: Self-pay | Admitting: Orthopaedic Surgery

## 2024-09-01 ENCOUNTER — Ambulatory Visit: Admitting: Orthopaedic Surgery

## 2024-09-01 DIAGNOSIS — M21371 Foot drop, right foot: Secondary | ICD-10-CM | POA: Diagnosis not present

## 2024-09-01 DIAGNOSIS — Z96651 Presence of right artificial knee joint: Secondary | ICD-10-CM

## 2024-09-01 NOTE — Progress Notes (Signed)
 The patient is now 5 months status post a right total knee arthroplasty secondary to severe end-stage arthritis and a significant deformity of the right knee.  Her postoperative course has been complicated by foot drop.  Nerve conduction study shows significant slowing through the sciatic nerve from the back of the knee indicating a combination of the likely stretch injury and retraction.  She does have a AFO for her foot drop.  We have tried some medications such as Neurontin  and Lyrica .  Those have not been helpful for her.  At her last visit a month ago it was recommended that we try vitamin C and vitamin B complex.  She does have a better fitting AFO now she reports.  She is also lost about 65 pounds.  Overall she looks like a different person and younger.  On exam the AFO is fitting nicely.  She actually has some slight dorsiflexion of her foot and toes.  He is very slight but it is there and this is an improvement.  From our standpoint she will continue increase her activities as tolerated.  We will see her back in 3 months with a standing AP and lateral of her right operative knee.  Hopefully this is a good sign that the nerve will slowly improve with time.

## 2024-09-17 ENCOUNTER — Telehealth: Payer: Self-pay | Admitting: Orthopaedic Surgery

## 2024-09-17 NOTE — Telephone Encounter (Signed)
 Patient  called and said because of her surgery her foot dropped and it feeling heavy and would like if you could send her somewhere to see about her foot. CB#[phone]

## 2024-09-20 NOTE — Telephone Encounter (Signed)
 Called patient

## 2024-10-18 ENCOUNTER — Encounter: Payer: Self-pay | Admitting: Radiology

## 2024-11-02 ENCOUNTER — Ambulatory Visit (HOSPITAL_BASED_OUTPATIENT_CLINIC_OR_DEPARTMENT_OTHER): Admitting: Student

## 2024-11-18 ENCOUNTER — Other Ambulatory Visit: Payer: Self-pay

## 2024-11-18 ENCOUNTER — Ambulatory Visit: Admitting: Physician Assistant

## 2024-11-18 ENCOUNTER — Encounter: Payer: Self-pay | Admitting: Physician Assistant

## 2024-11-18 DIAGNOSIS — M76829 Posterior tibial tendinitis, unspecified leg: Secondary | ICD-10-CM

## 2024-11-18 DIAGNOSIS — Z96651 Presence of right artificial knee joint: Secondary | ICD-10-CM

## 2024-11-18 DIAGNOSIS — M21371 Foot drop, right foot: Secondary | ICD-10-CM

## 2024-11-18 NOTE — Progress Notes (Signed)
 HPI: [name] returns today status post right total knee arthroplasty 03/19/2024.  States her knee overall is is doing well.  Main complaint today is foot pain.  Again she developed foot drop postoperative.  She did go to therapy but did not feel that this was beneficial.  Presents today in slippers.  States she has tightness across the dorsal aspect of her foot and is unable to tolerate most shoes.  She is no longer wearing the AFO due to swelling in her foot.  Review of systems see HPI otherwise negative.  Physical exam: General Well-developed well-nourished female no acute distress.  She is able to ambulate about the room with a rolling walker. Bilateral feet pes plano valgum right greater than left.  She is able to dorsiflex plantarflex both ankles.  Slight decrease dorsiflexion of the right foot compared to left.  No rashes skin lesions ulcerations and impending ulcers of either foot.  Too many toes sign bilaterally.  She is able to do a single heel raise on the left unable to perform on the right.  Tenderness over the right posterior tibial tendon maximal tenderness at the insertion site of the posterior tibial tendon on the right.  Right knee: Surgical incisions well-healed.  She has full extension full flexion.  No gross instability valgus varus stressing.  Right calf supple nontender.  Radiographs: AP lateral views of the right knee: He is well located.  Status post right total knee arthroplasty well-seated components.  No acute findings.  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  She is advised to get insert orthotics for both shoes with a hindfoot and arch support.  She will use Voltaren gel over the maximal area of tenderness right foot 2 g 4 times  daily.  Regards to the knee we do not need to see her back until 1 year postop and at that time we will obtain AP and lateral views of the knee.  Regards to the right foot drop and posterior tibial insufficiency will have her follow-up with us  in approximately 4 to 6 weeks see how she is doing overall.  Questions were encouraged and answered at length.

## 2024-12-01 ENCOUNTER — Ambulatory Visit: Admitting: Orthopaedic Surgery

## 2024-12-01 ENCOUNTER — Other Ambulatory Visit: Payer: Self-pay

## 2024-12-01 ENCOUNTER — Telehealth: Payer: Self-pay | Admitting: Physician Assistant

## 2024-12-01 DIAGNOSIS — M21371 Foot drop, right foot: Secondary | ICD-10-CM

## 2024-12-01 DIAGNOSIS — M76829 Posterior tibial tendinitis, unspecified leg: Secondary | ICD-10-CM

## 2024-12-01 NOTE — Telephone Encounter (Signed)
 Referral sent to [address]

## 2024-12-01 NOTE — Telephone Encounter (Signed)
 Pt called and said that [facility] sent her to a Rehab and she dont know which one. CB#[phone]

## 2024-12-15 ENCOUNTER — Emergency Department (HOSPITAL_COMMUNITY)

## 2024-12-15 ENCOUNTER — Other Ambulatory Visit: Payer: Self-pay

## 2024-12-15 ENCOUNTER — Emergency Department (HOSPITAL_COMMUNITY)
Admission: EM | Admit: 2024-12-15 | Discharge: 2024-12-15 | Discharge status: Against Medical Advice | Attending: Emergency Medicine | Admitting: Emergency Medicine

## 2024-12-15 ENCOUNTER — Encounter (HOSPITAL_COMMUNITY): Payer: Self-pay

## 2024-12-15 ENCOUNTER — Telehealth: Payer: Self-pay | Admitting: Orthopaedic Surgery

## 2024-12-15 DIAGNOSIS — Z5329 Procedure and treatment not carried out because of patient's decision for other reasons: Secondary | ICD-10-CM | POA: Insufficient documentation

## 2024-12-15 DIAGNOSIS — M21371 Foot drop, right foot: Secondary | ICD-10-CM

## 2024-12-15 DIAGNOSIS — M79604 Pain in right leg: Secondary | ICD-10-CM | POA: Insufficient documentation

## 2024-12-15 DIAGNOSIS — Z96651 Presence of right artificial knee joint: Secondary | ICD-10-CM

## 2024-12-15 LAB — CBC WITH DIFFERENTIAL/PLATELET
Abs Immature Granulocytes: 0.02 K/uL (ref 0.00–0.07)
Basophils Absolute: 0 K/uL (ref 0.0–0.1)
Basophils Relative: 1 %
Eosinophils Absolute: 0.1 K/uL (ref 0.0–0.5)
Eosinophils Relative: 3 %
HCT: 35.6 % — ABNORMAL LOW (ref 36.0–46.0)
Hemoglobin: 12 g/dL (ref 12.0–15.0)
Immature Granulocytes: 1 %
Lymphocytes Relative: 27 %
Lymphs Abs: 1.2 K/uL (ref 0.7–4.0)
MCH: 30.8 pg (ref 26.0–34.0)
MCHC: 33.7 g/dL (ref 30.0–36.0)
MCV: 91.3 fL (ref 80.0–100.0)
Monocytes Absolute: 0.4 K/uL (ref 0.1–1.0)
Monocytes Relative: 8 %
Neutro Abs: 2.7 K/uL (ref 1.7–7.7)
Neutrophils Relative %: 60 %
Platelets: 323 K/uL (ref 150–400)
RBC: 3.9 MIL/uL (ref 3.87–5.11)
RDW: 13.4 % (ref 11.5–15.5)
WBC: 4.4 K/uL (ref 4.0–10.5)
nRBC: 0 % (ref 0.0–0.2)

## 2024-12-15 LAB — COMPREHENSIVE METABOLIC PANEL WITH GFR
ALT: 15 U/L (ref 0–44)
AST: 19 U/L (ref 15–41)
Albumin: 3.8 g/dL (ref 3.5–5.0)
Alkaline Phosphatase: 89 U/L (ref 38–126)
Anion gap: 7 (ref 5–15)
BUN: 14 mg/dL (ref 8–23)
CO2: 27 mmol/L (ref 22–32)
Calcium: 9.4 mg/dL (ref 8.9–10.3)
Chloride: 105 mmol/L (ref 98–111)
Creatinine, Ser: 0.7 mg/dL (ref 0.44–1.00)
GFR, Estimated: >60 mL/min
Glucose, Bld: 105 mg/dL — ABNORMAL HIGH (ref 70–99)
Potassium: 3.5 mmol/L (ref 3.5–5.1)
Sodium: 140 mmol/L (ref 135–145)
Total Bilirubin: 0.6 mg/dL (ref 0.0–1.2)
Total Protein: 6.6 g/dL (ref 6.5–8.1)

## 2024-12-15 NOTE — Telephone Encounter (Signed)
 Pt states that her right leg is swollen and hurting severely for the past 4/5 days and she wants to have a ultrasound done to make sure she doesn't have a blood clot.  Pt states she went to the ED but left after waiting several hours.

## 2024-12-15 NOTE — ED Notes (Signed)
 Save blue tube in main lab

## 2024-12-15 NOTE — ED Triage Notes (Signed)
 Pt. Arrives via ems for right leg pain. Pt. Has had worsening pain and swelling the past few days. Pt. Is concerned about dvt. States that her leg feels tight. There is no redness. Leg is not warm to touch.

## 2024-12-15 NOTE — ED Notes (Signed)
 Pt states she is going to her orthopedic, she has her ride coming to pick her up.

## 2024-12-15 NOTE — Telephone Encounter (Signed)
 Patient  scheduled for this Friday

## 2024-12-17 ENCOUNTER — Ambulatory Visit (HOSPITAL_COMMUNITY)
Admission: RE | Admit: 2024-12-17 | Discharge: 2024-12-17 | Disposition: A | Source: Ambulatory Visit | Attending: Orthopaedic Surgery | Admitting: Orthopaedic Surgery

## 2024-12-17 DIAGNOSIS — Z96651 Presence of right artificial knee joint: Secondary | ICD-10-CM | POA: Insufficient documentation

## 2024-12-17 DIAGNOSIS — M21371 Foot drop, right foot: Secondary | ICD-10-CM | POA: Insufficient documentation

## 2024-12-20 ENCOUNTER — Ambulatory Visit: Attending: Orthopaedic Surgery | Admitting: Physical Therapy

## 2024-12-21 ENCOUNTER — Telehealth: Payer: Self-pay | Admitting: Physician Assistant

## 2024-12-21 DIAGNOSIS — M76829 Posterior tibial tendinitis, unspecified leg: Secondary | ICD-10-CM

## 2024-12-21 DIAGNOSIS — Z96651 Presence of right artificial knee joint: Secondary | ICD-10-CM

## 2024-12-21 DIAGNOSIS — M21371 Foot drop, right foot: Secondary | ICD-10-CM

## 2024-12-21 NOTE — Telephone Encounter (Signed)
 Pt state she does not want to go back to PT on [facility] anymore. Pt requesting a referral to go to [facility] on [address] [phone]

## 2024-12-21 NOTE — Telephone Encounter (Signed)
"  Referral placed   "

## 2024-12-22 ENCOUNTER — Ambulatory Visit: Admitting: Plastic Surgery

## 2024-12-22 VITALS — BP 127/80 | HR 66 | Ht 68.0 in | Wt 308.4 lb

## 2024-12-22 DIAGNOSIS — Z6841 Body Mass Index (BMI) 40.0 and over, adult: Secondary | ICD-10-CM | POA: Diagnosis not present

## 2024-12-22 DIAGNOSIS — M793 Panniculitis, unspecified: Secondary | ICD-10-CM | POA: Diagnosis not present

## 2024-12-22 NOTE — Progress Notes (Signed)
 Ms. Vankleeck returns today to follow-up on her initial appointment for a panniculectomy.  Since I saw her last time she had a knee replacement and has developed a foot drop which she is rehabbing.  Her weight remains stable at 308 pounds with a BMI of 47.  As she is able to ambulate more she will continue to work on weight loss.  Follow-up with me in approximately 6 months.

## 2024-12-29 ENCOUNTER — Encounter: Payer: Self-pay | Admitting: Physician Assistant

## 2024-12-29 ENCOUNTER — Ambulatory Visit: Admitting: Physician Assistant

## 2024-12-29 DIAGNOSIS — Z96651 Presence of right artificial knee joint: Secondary | ICD-10-CM | POA: Diagnosis not present

## 2024-12-29 DIAGNOSIS — M76829 Posterior tibial tendinitis, unspecified leg: Secondary | ICD-10-CM | POA: Diagnosis not present

## 2024-12-29 DIAGNOSIS — M21371 Foot drop, right foot: Secondary | ICD-10-CM | POA: Diagnosis not present

## 2024-12-29 NOTE — Progress Notes (Signed)
 HPI: Mrs. Unrein presents today follow-up status post right total knee arthroplasty 03/19/2024.  She is doing well in regards to the knee.  She is making progress with her foot drop on the right side which was acquired.  She has obtained inserts for her shoes and is waiting on new shoes with better arch support to arrive.  Unfortunately she did not get into therapy but does start therapy this coming Monday for foot drop and for the posterior tibial tendinitis on the right.  She had some concern about the swelling in her right leg and underwent a Doppler which was negative for DVT.  She has been a unable to don compression hose even though she has bought multiple pairs.  She uses a rolling walker to ambulate.  Review of systems: See HPI otherwise negative  Physical exam: General well-developed well-nourished female ambulates with a slow antalgic gait with the use of a rolling walker. Right knee: Good range of motion no instability valgus varus stressing no abnormal warmth erythema.  Surgical incisions healed well no signs of infection  Right foot/ankle: Right foot she has slight decrease in dorsiflexion but is able to dorsiflex the foot.  She has full inversion and eversion of the foot against resistance without any weakness.  Nontender over the posterior tibial tendon on the right.  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  She is given the address and phone number for elastic therapy in [place] she will call them to see if they can help her with pinning of compression hose.  She will follow-up with Dr. Vernetta in 6 weeks see how she is doing overall.  Questions encouraged and answered at length.

## 2025-02-09 ENCOUNTER — Ambulatory Visit: Admitting: Physician Assistant

## 2025-06-22 ENCOUNTER — Ambulatory Visit: Admitting: Plastic Surgery
